# Patient Record
Sex: Female | Born: 1940 | Race: Black or African American | Hispanic: No | Marital: Single
Health system: Southern US, Academic
[De-identification: ages and names within clinical notes are randomized; demographics above are authoritative.]

## PROBLEM LIST (undated history)

## (undated) ENCOUNTER — Telehealth: Attending: Internal Medicine | Primary: Internal Medicine

## (undated) ENCOUNTER — Ambulatory Visit

## (undated) ENCOUNTER — Telehealth

## (undated) ENCOUNTER — Ambulatory Visit: Attending: Clinical | Primary: Clinical

## (undated) ENCOUNTER — Encounter

## (undated) ENCOUNTER — Encounter: Attending: Audiologist | Primary: Audiologist

## (undated) ENCOUNTER — Encounter: Attending: Internal Medicine | Primary: Internal Medicine

## (undated) ENCOUNTER — Ambulatory Visit: Payer: MEDICARE

## (undated) ENCOUNTER — Encounter: Payer: MEDICARE | Attending: Ophthalmology | Primary: Ophthalmology

## (undated) ENCOUNTER — Ambulatory Visit: Payer: MEDICARE | Attending: Internal Medicine | Primary: Internal Medicine

## (undated) ENCOUNTER — Encounter
Attending: Student in an Organized Health Care Education/Training Program | Primary: Student in an Organized Health Care Education/Training Program

## (undated) ENCOUNTER — Ambulatory Visit: Payer: Medicare (Managed Care)

## (undated) ENCOUNTER — Ambulatory Visit: Attending: Internal Medicine | Primary: Internal Medicine

## (undated) ENCOUNTER — Encounter: Payer: MEDICARE | Attending: Internal Medicine | Primary: Internal Medicine

## (undated) ENCOUNTER — Non-Acute Institutional Stay: Payer: MEDICARE | Attending: Internal Medicine | Primary: Internal Medicine

## (undated) ENCOUNTER — Encounter
Payer: MEDICARE | Attending: Student in an Organized Health Care Education/Training Program | Primary: Student in an Organized Health Care Education/Training Program

## (undated) ENCOUNTER — Ambulatory Visit
Payer: MEDICARE | Attending: Student in an Organized Health Care Education/Training Program | Primary: Student in an Organized Health Care Education/Training Program

## (undated) ENCOUNTER — Encounter: Attending: Anesthesiology | Primary: Anesthesiology

## (undated) ENCOUNTER — Other Ambulatory Visit: Attending: Clinical | Primary: Clinical

## (undated) ENCOUNTER — Ambulatory Visit: Payer: Medicare (Managed Care) | Attending: Internal Medicine | Primary: Internal Medicine

## (undated) ENCOUNTER — Encounter: Attending: Family | Primary: Family

## (undated) ENCOUNTER — Encounter: Attending: Cardiovascular Disease | Primary: Cardiovascular Disease

## (undated) ENCOUNTER — Ambulatory Visit: Payer: MEDICARE | Attending: Clinical | Primary: Clinical

## (undated) ENCOUNTER — Encounter: Attending: Ophthalmology | Primary: Ophthalmology

## (undated) ENCOUNTER — Other Ambulatory Visit

## (undated) ENCOUNTER — Ambulatory Visit: Payer: MEDICARE | Attending: Obstetrics & Gynecology | Primary: Obstetrics & Gynecology

## (undated) ENCOUNTER — Ambulatory Visit: Payer: MEDICARE | Attending: Audiologist | Primary: Audiologist

## (undated) ENCOUNTER — Encounter: Payer: MEDICARE | Attending: Audiologist | Primary: Audiologist

## (undated) ENCOUNTER — Ambulatory Visit
Payer: MEDICARE | Attending: Rehabilitative and Restorative Service Providers" | Primary: Rehabilitative and Restorative Service Providers"

## (undated) ENCOUNTER — Ambulatory Visit: Payer: Medicare (Managed Care) | Attending: Clinical | Primary: Clinical

## (undated) DIAGNOSIS — E119 Type 2 diabetes mellitus without complications: Secondary | ICD-10-CM

## (undated) DIAGNOSIS — I10 Essential (primary) hypertension: Secondary | ICD-10-CM

## (undated) HISTORY — PX: MENISCUS REPAIR: SHX5179

---
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
## 2014-04-16 DIAGNOSIS — I10 Essential (primary) hypertension: Secondary | ICD-10-CM | POA: Insufficient documentation

## 2015-02-12 ENCOUNTER — Encounter: Payer: Self-pay | Admitting: Emergency Medicine

## 2015-02-12 ENCOUNTER — Emergency Department
Admission: EM | Admit: 2015-02-12 | Discharge: 2015-02-12 | Disposition: A | Discharge status: Home / Self Care | Payer: Medicare Other | Attending: Emergency Medicine | Admitting: Emergency Medicine

## 2015-02-12 ENCOUNTER — Emergency Department: Payer: Medicare Other

## 2015-02-12 DIAGNOSIS — I10 Essential (primary) hypertension: Secondary | ICD-10-CM | POA: Insufficient documentation

## 2015-02-12 DIAGNOSIS — G8929 Other chronic pain: Secondary | ICD-10-CM | POA: Insufficient documentation

## 2015-02-12 DIAGNOSIS — R062 Wheezing: Secondary | ICD-10-CM | POA: Diagnosis not present

## 2015-02-12 DIAGNOSIS — R0602 Shortness of breath: Secondary | ICD-10-CM | POA: Diagnosis present

## 2015-02-12 DIAGNOSIS — Z880 Allergy status to penicillin: Secondary | ICD-10-CM | POA: Insufficient documentation

## 2015-02-12 DIAGNOSIS — E119 Type 2 diabetes mellitus without complications: Secondary | ICD-10-CM | POA: Diagnosis not present

## 2015-02-12 DIAGNOSIS — R0600 Dyspnea, unspecified: Secondary | ICD-10-CM | POA: Diagnosis not present

## 2015-02-12 DIAGNOSIS — M25561 Pain in right knee: Secondary | ICD-10-CM | POA: Insufficient documentation

## 2015-02-12 DIAGNOSIS — R0789 Other chest pain: Secondary | ICD-10-CM | POA: Insufficient documentation

## 2015-02-12 DIAGNOSIS — Z792 Long term (current) use of antibiotics: Secondary | ICD-10-CM | POA: Insufficient documentation

## 2015-02-12 DIAGNOSIS — M25562 Pain in left knee: Secondary | ICD-10-CM | POA: Insufficient documentation

## 2015-02-12 DIAGNOSIS — Z7982 Long term (current) use of aspirin: Secondary | ICD-10-CM | POA: Diagnosis not present

## 2015-02-12 DIAGNOSIS — Z87891 Personal history of nicotine dependence: Secondary | ICD-10-CM | POA: Diagnosis not present

## 2015-02-12 HISTORY — DX: Type 2 diabetes mellitus without complications: E11.9

## 2015-02-12 HISTORY — DX: Essential (primary) hypertension: I10

## 2015-02-12 LAB — CBC
HEMATOCRIT: 38.8 % (ref 35.0–47.0)
Hemoglobin: 12.5 g/dL (ref 12.0–16.0)
MCH: 24.4 pg — ABNORMAL LOW (ref 26.0–34.0)
MCHC: 32.2 g/dL (ref 32.0–36.0)
MCV: 75.7 fL — AB (ref 80.0–100.0)
Platelets: 285 10*3/uL (ref 150–440)
RBC: 5.12 MIL/uL (ref 3.80–5.20)
RDW: 16.4 % — AB (ref 11.5–14.5)
WBC: 7.7 10*3/uL (ref 3.6–11.0)

## 2015-02-12 LAB — BASIC METABOLIC PANEL
ANION GAP: 8 (ref 5–15)
BUN: 18 mg/dL (ref 6–20)
CALCIUM: 9 mg/dL (ref 8.9–10.3)
CHLORIDE: 109 mmol/L (ref 101–111)
CO2: 25 mmol/L (ref 22–32)
Creatinine, Ser: 0.94 mg/dL (ref 0.44–1.00)
GFR calc Af Amer: >60 mL/min (ref >60)
GFR calc non Af Amer: 59 mL/min — ABNORMAL LOW (ref >60)
Glucose, Bld: 112 mg/dL — ABNORMAL HIGH (ref 65–99)
Potassium: 3.5 mmol/L (ref 3.5–5.1)
SODIUM: 142 mmol/L (ref 135–145)

## 2015-02-12 LAB — TROPONIN I: Troponin I: <0.03 ng/mL (ref <0.031)

## 2015-02-12 LAB — BRAIN NATRIURETIC PEPTIDE: B NATRIURETIC PEPTIDE 5: 375 pg/mL — AB (ref 0.0–100.0)

## 2015-02-12 MED ORDER — BACITRACIN ZINC 500 UNIT/GM EX OINT
TOPICAL_OINTMENT | CUTANEOUS | Status: AC
Start: 2015-02-12 — End: 2015-02-13
  Filled 2015-02-12: qty 0.9

## 2015-02-12 NOTE — ED Notes (Signed)
Patient back from  X-ray 

## 2015-02-12 NOTE — ED Notes (Signed)
Pt presents to ED via EMS with c/o of SOB occurring today upon awakening from sleep approx. 1:30am. EMS states pt is currently non-compliant with home BP medications. Pt arrived to treatment room alert and oriented x4 and NAD noted. Respirations even, unlabored. Pt denies any pain at this time. VS per EMS listed below:  180/100 BP 76 HR 93% O2 RA  30 CO2 21 RR 0/10 pain level

## 2015-02-12 NOTE — ED Notes (Signed)
Patient discharged with instructions explained, patient verbalized no further question and understanding of instructions given. Encouraged to follow up with primary MD as indicated and return for any problems.

## 2015-02-12 NOTE — Discharge Instructions (Signed)

## 2015-02-12 NOTE — ED Provider Notes (Signed)
[facility] Emergency Department Provider Note  ____________________________________________  Time seen: Approximately 250 AM  I have reviewed the triage vital signs and the nursing notes.   HISTORY  Chief Complaint Shortness of Breath    HPI Rachael Gill is a 74 y.o. female who comes in with difficulty breathing. The patient reports that the difficulty breathing started yesterday when she woke up. She reports that she tried to lay back down and felt as though she wasn't getting enough air. She reports that she started wheezing so she went outside. She reports that she did some tai chi and thinks that helped to calm her breathing and then went back into the house. She reports that she was doing okay. She reports today she worked in the house all day. The patient reports that she did some significant amounts of cleaning in the house today. She said that she went to bed at approximately 2330 and woke up feeling as though she couldn't breathe again. She reports that she feels as though her trachea is closing. She reports that she heard wheezing again. She reports mild chest tightness and pain. She denies any fever or dizziness or lightheadedness she had no nausea or vomiting or sweats. The patient reports that her lower extremities do not swell but she does have some chronic knee pain and swelling. The patient is concerned about feeling short of breath so she came in for evaluation.   Past Medical History  Diagnosis Date  . Hypertension   . Diabetes mellitus without complication     There are no active problems to display for this patient.   Past Surgical History  Procedure Laterality Date  . Meniscus repair      Current Outpatient Rx  Name  Route  Sig  Dispense  Refill  . aspirin 81 MG tablet   Oral   Take 81 mg by mouth daily.         Marland Kitchen doxycycline (MONODOX) 50 MG capsule   Oral   Take 50 mg by mouth 2 (two) times daily.         . SUMAtriptan  (IMITREX) 25 MG tablet   Oral   Take 25 mg by mouth every 2 (two) hours as needed for migraine. May repeat in 2 hours if headache persists or recurs.           Allergies Penicillins and Sulfur  History reviewed. No pertinent family history.  Social History History  Substance Use Topics  . Smoking status: Former Research scientist (life sciences)  . Smokeless tobacco: Not on file  . Alcohol Use: Yes    Review of Systems Constitutional: No fever/chills Eyes: No visual changes. ENT: No sore throat. Cardiovascular: chest pain. Respiratory: shortness of breath. Gastrointestinal: No abdominal pain.  No nausea, no vomiting.   Genitourinary: Negative for dysuria. Musculoskeletal: Negative for back pain. Skin: Negative for rash. Neurological: Negative for headaches, lightheadedness or dizziness  10-point ROS otherwise negative.  ____________________________________________   PHYSICAL EXAM:  VITAL SIGNS: ED Triage Vitals  Enc Vitals Group     BP 02/12/15 0216 172/119 mmHg     Pulse Rate 02/12/15 0216 81     Resp 02/12/15 0216 22     Temp 02/12/15 0216 98.2 F (36.8 C)     Temp Source 02/12/15 0216 Oral     SpO2 02/12/15 0216 95 %     Weight 02/12/15 0216 157 lb (71.215 kg)     Height 02/12/15 0216 _0  (1.702 m)  Head Cir --      Peak Flow --      Pain Score 02/12/15 0217 0     Pain Loc --      Pain Edu? --      Excl. in [place]? --     Constitutional: Alert and oriented. Well appearing and in no acute distress. Eyes: Conjunctivae are normal. PERRL. EOMI. Head: Atraumatic. Nose: No congestion/rhinnorhea. Mouth/Throat: Mucous membranes are moist.  Oropharynx non-erythematous. Cardiovascular: Normal rate, regular rhythm. Grossly normal heart sounds.  Good peripheral circulation. Respiratory: Normal respiratory effort.  No retractions. Lungs CTAB. Gastrointestinal: Soft and nontender. No distention. Positive bowel sounds Genitourinary: Deferred Musculoskeletal: No lower extremity  tenderness nor edema.   Neurologic:  Normal speech and language. No gross focal neurologic deficits are appreciated.  Skin:  Skin is warm, dry and intact. No rash noted. Psychiatric: Mood and affect are normal. Speech and behavior are normal.  ____________________________________________   LABS (all labs ordered are listed, but only abnormal results are displayed)  Labs Reviewed  CBC - Abnormal; Notable for the following:    MCV 75.7 (*)    MCH 24.4 (*)    RDW 16.4 (*)    All other components within normal limits  BASIC METABOLIC PANEL - Abnormal; Notable for the following:    Glucose, Bld 112 (*)    GFR calc non Af Amer 59 (*)    All other components within normal limits  BRAIN NATRIURETIC PEPTIDE - Abnormal; Notable for the following:    B Natriuretic Peptide 375.0 (*)    All other components within normal limits  TROPONIN I   ____________________________________________  EKG  ED ECG REPORT   Date: 02/12/2015  EKG Time: 224  Rate: 75  Rhythm: normal EKG, normal sinus rhythm, PAC's noted  Axis: Left axis  Intervals:none  ST&T Change: Normal  ____________________________________________  RADIOLOGY  Chest x-ray: Diffusely increased interstitial markings may reflect chronic interstitial lung disease. ____________________________________________   PROCEDURES  Procedure(s) performed: None  Critical Care performed: No  ____________________________________________   INITIAL IMPRESSION / ASSESSMENT AND PLAN / ED COURSE  Pertinent labs & imaging results that were available during my care of the patient were reviewed by me and considered in my medical decision making (see chart for details).  This is a 74 year old female who comes in today with some shortness of breath and chest tightness. We will perform some blood work and perform a chest x-ray and reassess the patient. The patient appears in no acute distress and is calm at the moment.  The patient's blood  work is unremarkable. The patient has had no vital sign changes or hypoxia. The patient's lung sounds are clear and the patient is in no acute distress. I will discharge the patient home to follow-up with her primary care physician. At this time it is unknown the etiology of the patient's dyspnea but it is recommended that she follow-up with her primary care physician for further evaluation. ____________________________________________   FINAL CLINICAL IMPRESSION(S) / ED DIAGNOSES  Final diagnoses:  dypnea      Loney Hering, MD 02/12/15 [id number]

## 2015-02-12 NOTE — ED Notes (Signed)
Patient transported to X-ray 

## 2015-02-27 ENCOUNTER — Other Ambulatory Visit: Payer: Self-pay

## 2015-03-11 ENCOUNTER — Inpatient Hospital Stay: Admission: RE | Admit: 2015-03-11 | Payer: Medicare Other | Source: Ambulatory Visit | Admitting: Orthopedic Surgery

## 2015-03-11 ENCOUNTER — Encounter: Admission: RE | Payer: Self-pay | Source: Ambulatory Visit

## 2015-03-11 SURGERY — ARTHROPLASTY, KNEE, TOTAL
Anesthesia: Choice | Laterality: Right

## 2015-04-15 ENCOUNTER — Other Ambulatory Visit: Payer: Self-pay | Admitting: Specialist

## 2015-04-15 DIAGNOSIS — R0602 Shortness of breath: Secondary | ICD-10-CM

## 2015-04-15 DIAGNOSIS — J849 Interstitial pulmonary disease, unspecified: Secondary | ICD-10-CM

## 2015-04-23 ENCOUNTER — Ambulatory Visit
Admission: RE | Admit: 2015-04-23 | Discharge: 2015-04-23 | Disposition: A | Discharge status: Home / Self Care | Payer: Medicare Other | Source: Ambulatory Visit | Attending: Specialist | Admitting: Specialist

## 2015-04-23 DIAGNOSIS — J849 Interstitial pulmonary disease, unspecified: Secondary | ICD-10-CM | POA: Diagnosis not present

## 2015-04-23 DIAGNOSIS — R0602 Shortness of breath: Secondary | ICD-10-CM | POA: Insufficient documentation

## 2015-04-29 ENCOUNTER — Emergency Department
Admission: EM | Admit: 2015-04-29 | Discharge: 2015-04-29 | Discharge status: Against Medical Advice | Payer: Medicare Other | Attending: Emergency Medicine | Admitting: Emergency Medicine

## 2015-04-29 ENCOUNTER — Emergency Department: Payer: Medicare Other

## 2015-04-29 ENCOUNTER — Other Ambulatory Visit: Payer: Self-pay

## 2015-04-29 ENCOUNTER — Other Ambulatory Visit: Payer: Self-pay | Admitting: Specialist

## 2015-04-29 DIAGNOSIS — I10 Essential (primary) hypertension: Secondary | ICD-10-CM | POA: Diagnosis not present

## 2015-04-29 DIAGNOSIS — R079 Chest pain, unspecified: Secondary | ICD-10-CM | POA: Diagnosis present

## 2015-04-29 DIAGNOSIS — R911 Solitary pulmonary nodule: Secondary | ICD-10-CM

## 2015-04-29 DIAGNOSIS — E119 Type 2 diabetes mellitus without complications: Secondary | ICD-10-CM | POA: Diagnosis not present

## 2015-04-29 LAB — BASIC METABOLIC PANEL
ANION GAP: 8 (ref 5–15)
BUN: 23 mg/dL — ABNORMAL HIGH (ref 6–20)
CO2: 27 mmol/L (ref 22–32)
CREATININE: 0.98 mg/dL (ref 0.44–1.00)
Calcium: 9.2 mg/dL (ref 8.9–10.3)
Chloride: 102 mmol/L (ref 101–111)
GFR calc Af Amer: >60 mL/min (ref >60)
GFR calc non Af Amer: 56 mL/min — ABNORMAL LOW (ref >60)
GLUCOSE: 94 mg/dL (ref 65–99)
Potassium: 4 mmol/L (ref 3.5–5.1)
Sodium: 137 mmol/L (ref 135–145)

## 2015-04-29 LAB — CBC
HCT: 41.8 % (ref 35.0–47.0)
Hemoglobin: 13.3 g/dL (ref 12.0–16.0)
MCH: 23.7 pg — AB (ref 26.0–34.0)
MCHC: 31.8 g/dL — AB (ref 32.0–36.0)
MCV: 74.4 fL — ABNORMAL LOW (ref 80.0–100.0)
PLATELETS: 298 10*3/uL (ref 150–440)
RBC: 5.61 MIL/uL — AB (ref 3.80–5.20)
RDW: 16.6 % — ABNORMAL HIGH (ref 11.5–14.5)
WBC: 6.4 10*3/uL (ref 3.6–11.0)

## 2015-04-29 LAB — TROPONIN I: Troponin I: <0.03 ng/mL (ref <0.031)

## 2015-04-29 NOTE — ED Notes (Signed)
Pt here for left of center chest pain that started this am. 4/10 pain. Denies sob.

## 2015-07-05 DIAGNOSIS — I340 Nonrheumatic mitral (valve) insufficiency: Secondary | ICD-10-CM | POA: Insufficient documentation

## 2015-09-04 ENCOUNTER — Ambulatory Visit
Admission: RE | Admit: 2015-09-04 | Discharge: 2015-09-04 | Disposition: A | Discharge status: Home / Self Care | Payer: Medicare Other | Source: Ambulatory Visit | Attending: Specialist | Admitting: Specialist

## 2015-09-04 DIAGNOSIS — R911 Solitary pulmonary nodule: Secondary | ICD-10-CM | POA: Diagnosis not present

## 2015-09-04 DIAGNOSIS — K449 Diaphragmatic hernia without obstruction or gangrene: Secondary | ICD-10-CM | POA: Insufficient documentation

## 2015-09-04 DIAGNOSIS — I2510 Atherosclerotic heart disease of native coronary artery without angina pectoris: Secondary | ICD-10-CM | POA: Insufficient documentation

## 2015-09-25 ENCOUNTER — Other Ambulatory Visit: Payer: Self-pay | Admitting: Specialist

## 2015-09-25 DIAGNOSIS — R911 Solitary pulmonary nodule: Secondary | ICD-10-CM

## 2015-12-04 DIAGNOSIS — E1169 Type 2 diabetes mellitus with other specified complication: Secondary | ICD-10-CM | POA: Insufficient documentation

## 2016-01-24 DIAGNOSIS — I359 Nonrheumatic aortic valve disorder, unspecified: Secondary | ICD-10-CM | POA: Insufficient documentation

## 2016-09-01 ENCOUNTER — Ambulatory Visit: Admission: RE | Admit: 2016-09-01 | Payer: Medicare Other | Source: Ambulatory Visit

## 2016-09-16 ENCOUNTER — Ambulatory Visit
Admission: RE | Admit: 2016-09-16 | Discharge: 2016-09-16 | Disposition: A | Discharge status: Home / Self Care | Payer: Medicare Other | Source: Ambulatory Visit | Attending: Specialist | Admitting: Specialist

## 2016-09-16 DIAGNOSIS — R911 Solitary pulmonary nodule: Secondary | ICD-10-CM | POA: Diagnosis present

## 2016-09-16 DIAGNOSIS — I700 Atherosclerosis of aorta: Secondary | ICD-10-CM | POA: Diagnosis not present

## 2016-09-16 DIAGNOSIS — I2510 Atherosclerotic heart disease of native coronary artery without angina pectoris: Secondary | ICD-10-CM | POA: Insufficient documentation

## 2017-04-07 MED ORDER — PREDNISOLONE SODIUM PHOSPHATE 1 % EYE DROPS
Freq: Every day | OPHTHALMIC | 1 refills | 0 days | Status: CP
Start: 2017-04-07 — End: 2017-12-07

## 2017-04-23 DIAGNOSIS — E785 Hyperlipidemia, unspecified: Secondary | ICD-10-CM | POA: Insufficient documentation

## 2017-04-23 DIAGNOSIS — I272 Pulmonary hypertension, unspecified: Secondary | ICD-10-CM | POA: Insufficient documentation

## 2017-04-23 DIAGNOSIS — E876 Hypokalemia: Secondary | ICD-10-CM | POA: Insufficient documentation

## 2017-10-20 ENCOUNTER — Encounter: Admit: 2017-10-20 | Discharge: 2017-10-21 | Payer: MEDICARE

## 2017-10-20 DIAGNOSIS — E1169 Type 2 diabetes mellitus with other specified complication: Principal | ICD-10-CM

## 2017-10-20 DIAGNOSIS — I10 Essential (primary) hypertension: Secondary | ICD-10-CM

## 2017-10-20 DIAGNOSIS — E785 Hyperlipidemia, unspecified: Secondary | ICD-10-CM

## 2017-10-20 DIAGNOSIS — Z79899 Other long term (current) drug therapy: Secondary | ICD-10-CM

## 2017-10-25 ENCOUNTER — Encounter: Admit: 2017-10-25 | Discharge: 2017-10-26 | Payer: MEDICARE | Attending: Internal Medicine | Primary: Internal Medicine

## 2017-10-25 DIAGNOSIS — R2231 Localized swelling, mass and lump, right upper limb: Secondary | ICD-10-CM

## 2017-10-25 DIAGNOSIS — E1169 Type 2 diabetes mellitus with other specified complication: Secondary | ICD-10-CM

## 2017-10-25 DIAGNOSIS — E785 Hyperlipidemia, unspecified: Secondary | ICD-10-CM

## 2017-10-25 DIAGNOSIS — I340 Nonrheumatic mitral (valve) insufficiency: Secondary | ICD-10-CM

## 2017-10-25 DIAGNOSIS — M542 Cervicalgia: Secondary | ICD-10-CM

## 2017-10-25 DIAGNOSIS — I10 Essential (primary) hypertension: Secondary | ICD-10-CM

## 2017-10-25 DIAGNOSIS — N904 Leukoplakia of vulva: Secondary | ICD-10-CM

## 2017-10-25 DIAGNOSIS — Z0000 Encounter for general adult medical examination without abnormal findings: Principal | ICD-10-CM

## 2017-10-25 MED ORDER — MAXZIDE 75 MG-50 MG TABLET: 1 | tablet | Freq: Every day | 3 refills | 0 days | Status: AC

## 2017-10-25 MED ORDER — MAXZIDE 75 MG-50 MG TABLET
ORAL_TABLET | Freq: Every day | ORAL | 3 refills | 0.00000 days | Status: CP
Start: 2017-10-25 — End: 2018-03-25

## 2017-10-26 MED ORDER — CLOBETASOL 0.05 % TOPICAL OINTMENT
Freq: Two times a day (BID) | TOPICAL | 0 refills | 0 days | Status: CP
Start: 2017-10-26 — End: 2017-10-27

## 2017-10-27 MED ORDER — HALOBETASOL PROPIONATE 0.05 % TOPICAL CREAM
Freq: Two times a day (BID) | TOPICAL | 0 refills | 0 days | Status: CP
Start: 2017-10-27 — End: 2018-10-06

## 2017-11-15 ENCOUNTER — Ambulatory Visit: Admit: 2017-11-15 | Discharge: 2017-11-16 | Payer: MEDICARE | Attending: Ophthalmology | Primary: Ophthalmology

## 2017-11-15 DIAGNOSIS — Z961 Presence of intraocular lens: Secondary | ICD-10-CM

## 2017-11-15 DIAGNOSIS — Z9841 Cataract extraction status, right eye: Secondary | ICD-10-CM

## 2017-11-15 DIAGNOSIS — H40009 Preglaucoma, unspecified, unspecified eye: Principal | ICD-10-CM

## 2017-11-15 DIAGNOSIS — H04123 Dry eye syndrome of bilateral lacrimal glands: Secondary | ICD-10-CM

## 2017-12-06 IMAGING — CT CT CHEST W/O CM
2 of 3 series · 15 of 36 positions shown, 18 images · non-contrast
Comparison: Chest CT 04/23/2015.

CLINICAL DATA: 74-year-old female with breathing difficulty. Former
smoker (quit 30 years ago). History of asthma.

EXAM:
CT CHEST WITHOUT CONTRAST
TECHNIQUE: Multidetector CT imaging of the chest was performed following the
standard protocol without IV contrast.

[Series 2: routine chest wo · axial · 0.69mm/px · z∈[-694,-440]mm · 12 of 61 slices shown, 15 images]
[im 5/61  mediastinal]
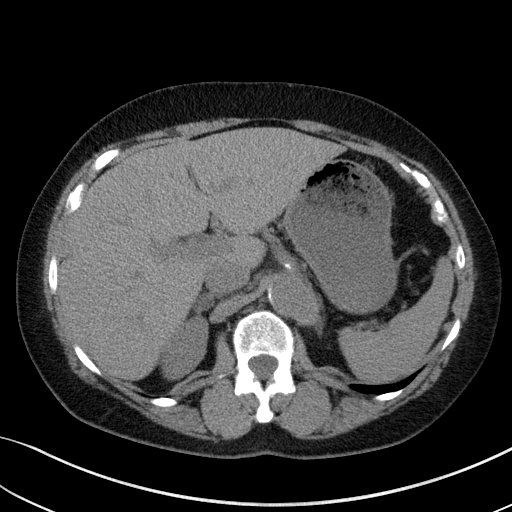
[im 5/61  lung]
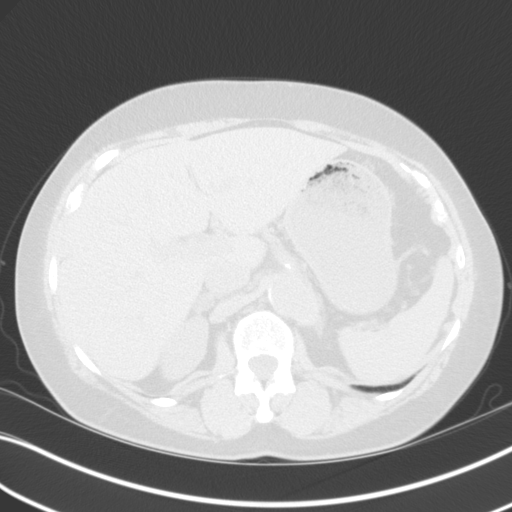
[im 9/61  lung]
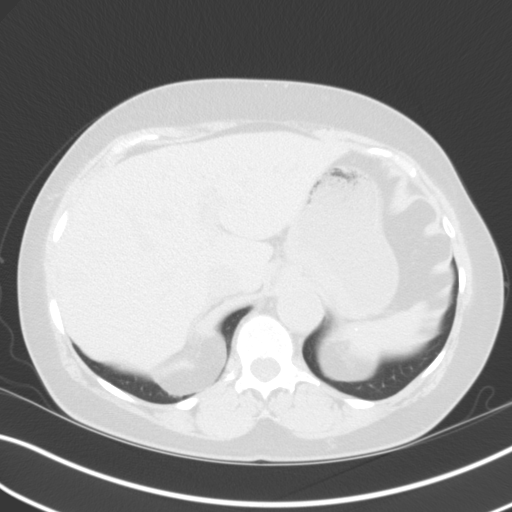
[im 14/61  lung]
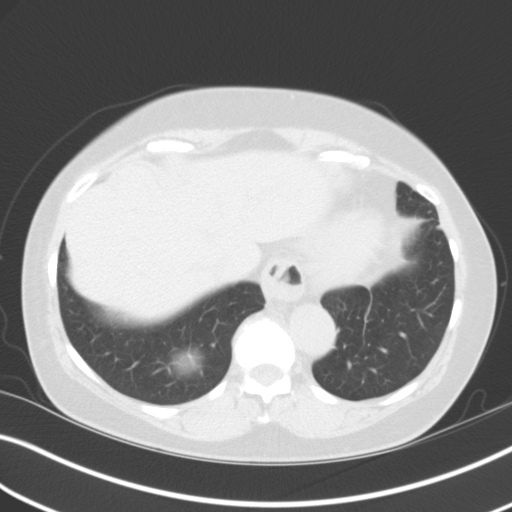
[im 18/61  lung]
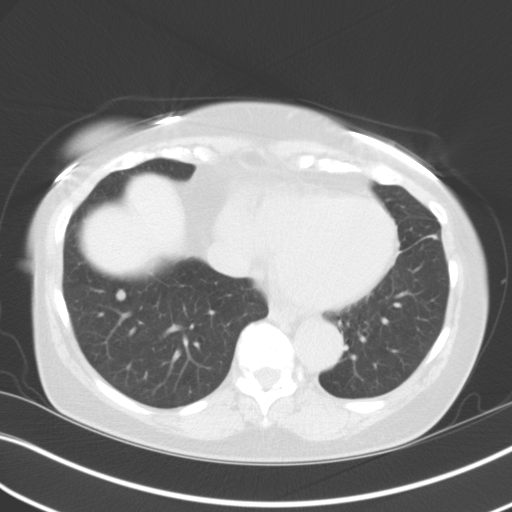
[im 23/61  mediastinal]
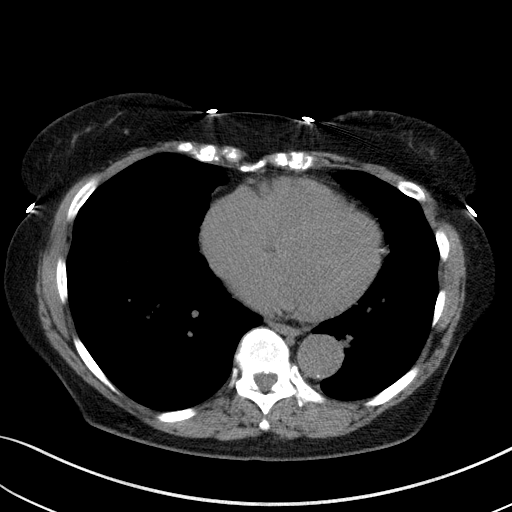
[im 23/61  lung]
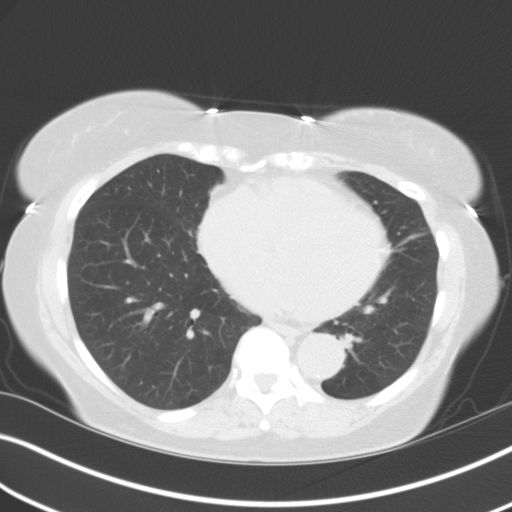
[im 27/61  lung]
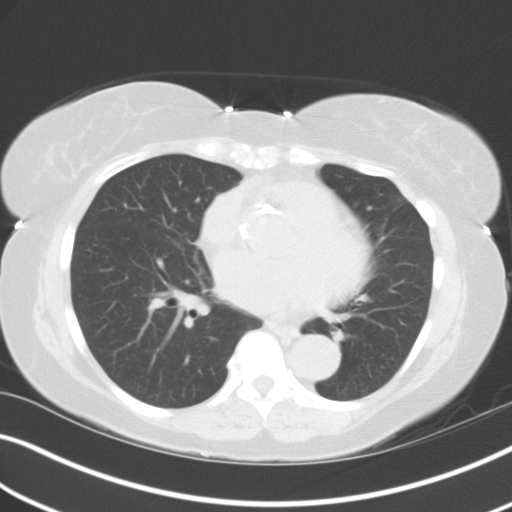
[im 34/61  lung]
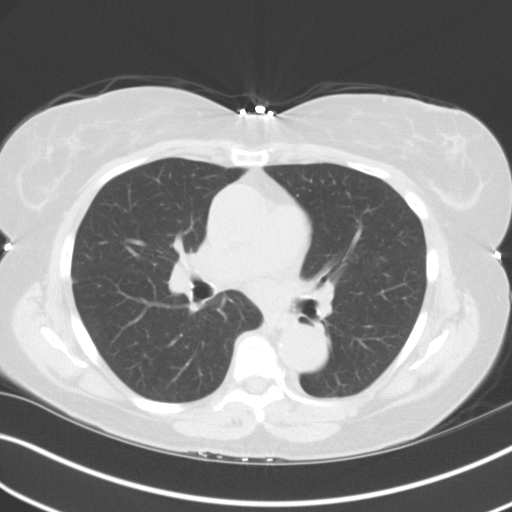
[im 38/61  lung]
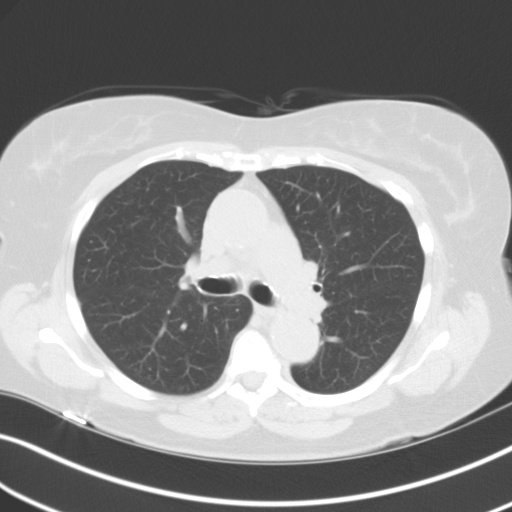
[im 43/61  mediastinal]
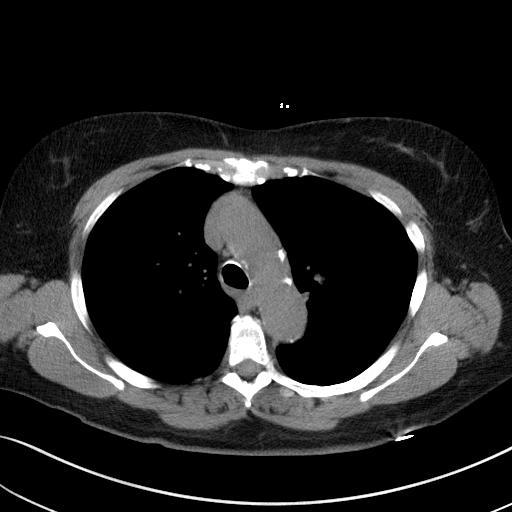
[im 43/61  lung]
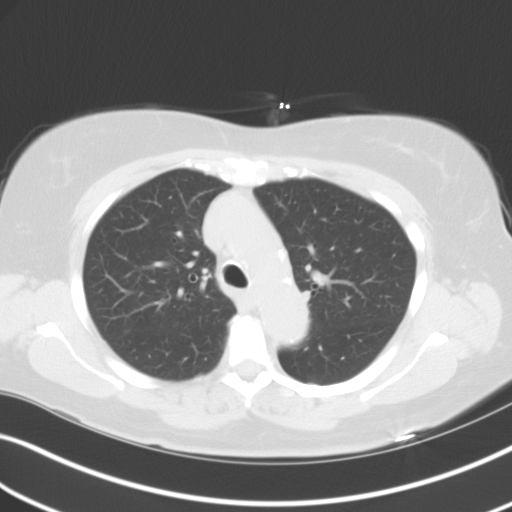
[im 47/61  lung]
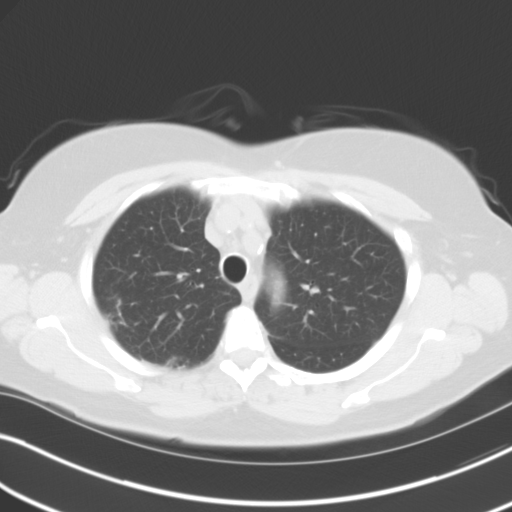
[im 52/61  lung]
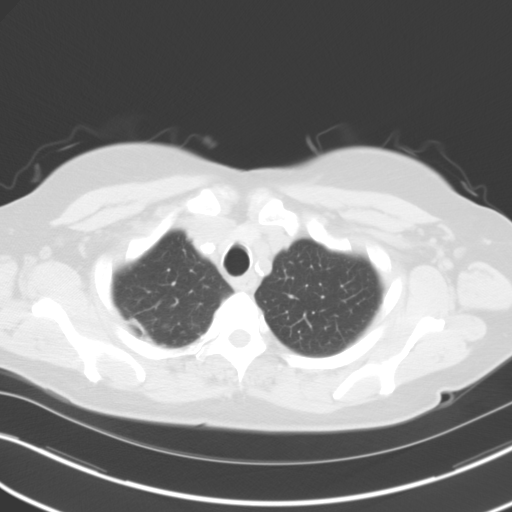
[im 56/61  lung]
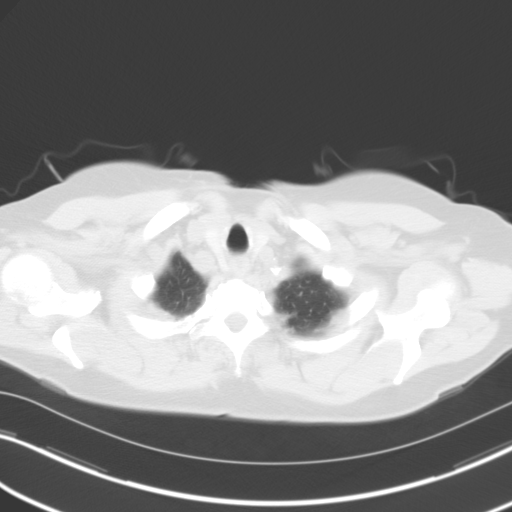

[Series 5: cor routine chest wo · coronal · 0.60mm/px · 3 of 126 slices shown]
[im 26/126  lung]
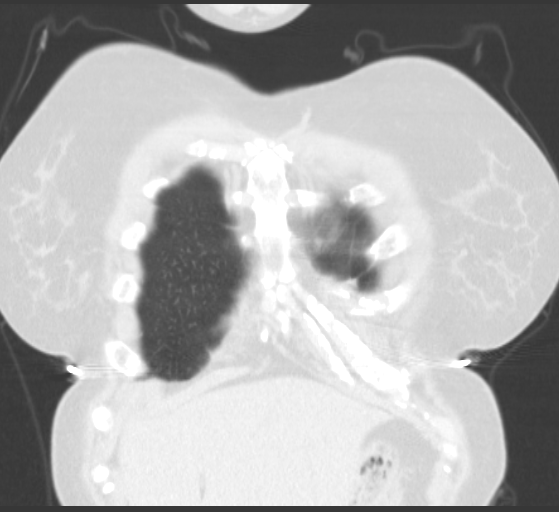
[im 51/126  lung]
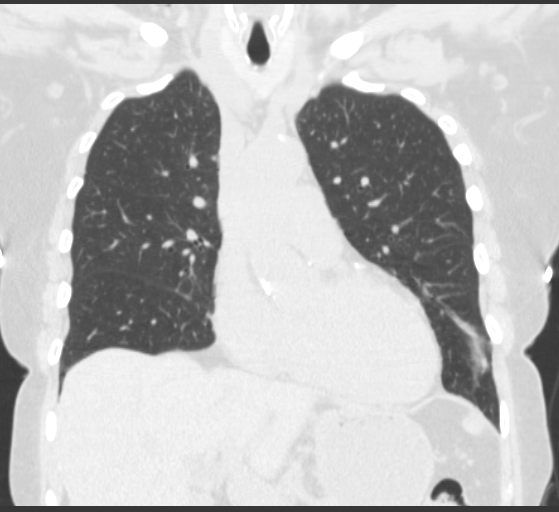
[im 76/126  lung]
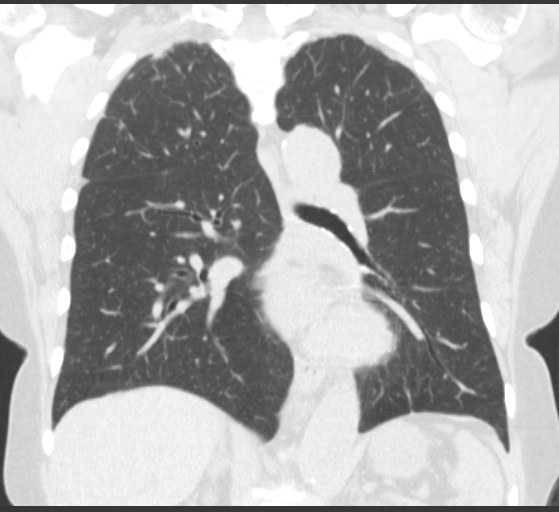

[15 of 36 positions shown; findings below may reference images not displayed]

FINDINGS: Mediastinum/Lymph Nodes: Heart size is normal. There is no
significant pericardial fluid, thickening or pericardial
calcification. There is atherosclerosis of the thoracic aorta, the
great vessels of the mediastinum and the coronary arteries,
including calcified atherosclerotic plaque in the left main, left
anterior descending, left circumflex and right coronary arteries.
Small hiatal hernia. No pathologically enlarged mediastinal or hilar
lymph nodes. Please note that accurate exclusion of hilar adenopathy
is limited on noncontrast CT scans. No axillary lymphadenopathy.

Lungs/Pleura: Small bilateral Bochdalek's hernias. 7 mm pulmonary
nodule in the inferior aspect of the right lower lobe (image 44 of
series 4) is unchanged. No other larger more suspicious appearing
pulmonary nodules or masses are noted. Scattered areas of linear
scarring are noted throughout the lungs bilaterally. There is also
some bilateral apical pleural parenchymal thickening (right greater
than left), which is unchanged, most compatible with chronic post
infectious or inflammatory scarring.

Upper Abdomen: Atherosclerosis.  Calcified granulomas in the spleen.

Musculoskeletal/Soft Tissues: There are no aggressive appearing
lytic or blastic lesions noted in the visualized portions of the
skeleton.
IMPRESSION: 1. No acute findings in the thorax to account for the patient's
symptoms.
2. 7 mm right lower lobe pulmonary nodule is unchanged, which is
reassuring given the 5 months of stability. A repeat noncontrast
chest CT is recommended in 13 months to ensure a full 18 months of
stability. This recommendation follows the consensus statement:
Guidelines for Management of Small Pulmonary Nodules Detected on CT
Scans: A Statement from the [HOSPITAL] as published in
3. Atherosclerosis, including left main and 3 vessel coronary artery
disease. Please note that although the presence of coronary artery
calcium documents the presence of coronary artery disease, the
severity of this disease and any potential stenosis cannot be
assessed on this non-gated CT examination. Assessment for potential
risk factor modification, dietary therapy or pharmacologic therapy
may be warranted, if clinically indicated.
4. Small hiatal hernia.
5. Additional incidental findings, as above.

## 2017-12-07 MED ORDER — PREDNISOLONE ACETATE 1 % EYE DROPS,SUSPENSION
Freq: Every day | OPHTHALMIC | 0 refills | 0.00000 days | Status: CP
Start: 2017-12-07 — End: 2018-02-28

## 2017-12-13 ENCOUNTER — Encounter: Admit: 2017-12-13 | Discharge: 2017-12-14 | Payer: MEDICARE | Attending: Ophthalmology | Primary: Ophthalmology

## 2017-12-13 DIAGNOSIS — H04123 Dry eye syndrome of bilateral lacrimal glands: Secondary | ICD-10-CM

## 2017-12-13 DIAGNOSIS — H40009 Preglaucoma, unspecified, unspecified eye: Principal | ICD-10-CM

## 2018-01-13 ENCOUNTER — Encounter: Admit: 2018-01-13 | Discharge: 2018-01-14 | Payer: MEDICARE | Attending: Ophthalmology | Primary: Ophthalmology

## 2018-01-13 DIAGNOSIS — H04123 Dry eye syndrome of bilateral lacrimal glands: Secondary | ICD-10-CM

## 2018-01-13 DIAGNOSIS — H40009 Preglaucoma, unspecified, unspecified eye: Principal | ICD-10-CM

## 2018-02-28 ENCOUNTER — Encounter: Admit: 2018-02-28 | Discharge: 2018-03-01 | Payer: MEDICARE | Attending: Internal Medicine | Primary: Internal Medicine

## 2018-02-28 DIAGNOSIS — I10 Essential (primary) hypertension: Principal | ICD-10-CM

## 2018-02-28 DIAGNOSIS — I272 Pulmonary hypertension, unspecified: Secondary | ICD-10-CM

## 2018-02-28 MED ORDER — AMLODIPINE 2.5 MG TABLET
ORAL_TABLET | Freq: Every day | ORAL | 2 refills | 0.00000 days | Status: CP
Start: 2018-02-28 — End: 2018-03-25

## 2018-03-02 MED ORDER — PREDNISOLONE ACETATE 1 % EYE DROPS,SUSPENSION
0 refills | 0 days | Status: CP
Start: 2018-03-02 — End: 2018-10-06

## 2018-03-25 MED ORDER — MAXZIDE 75 MG-50 MG TABLET
ORAL_TABLET | 1 refills | 0 days | Status: CP
Start: 2018-03-25 — End: 2018-10-27

## 2018-03-25 MED ORDER — AMLODIPINE 2.5 MG TABLET
ORAL_TABLET | Freq: Every day | ORAL | 1 refills | 0.00000 days | Status: CP
Start: 2018-03-25 — End: 2018-10-06

## 2018-03-25 MED ORDER — ONETOUCH ULTRA BLUE TEST STRIP
1 refills | 0 days | Status: CP
Start: 2018-03-25 — End: 2018-10-26

## 2018-04-06 ENCOUNTER — Ambulatory Visit: Admit: 2018-04-06 | Discharge: 2018-04-07 | Payer: MEDICARE | Attending: Internal Medicine | Primary: Internal Medicine

## 2018-04-06 DIAGNOSIS — I10 Essential (primary) hypertension: Secondary | ICD-10-CM

## 2018-04-06 DIAGNOSIS — K644 Residual hemorrhoidal skin tags: Principal | ICD-10-CM

## 2018-05-27 MED ORDER — LANCETS 23 GAUGE
Freq: Every day | 1 refills | 0.00000 days | Status: CP
Start: 2018-05-27 — End: 2018-10-26

## 2018-07-01 ENCOUNTER — Encounter: Admit: 2018-07-01 | Discharge: 2018-07-02 | Payer: MEDICARE

## 2018-07-01 DIAGNOSIS — E1169 Type 2 diabetes mellitus with other specified complication: Principal | ICD-10-CM

## 2018-07-01 DIAGNOSIS — E785 Hyperlipidemia, unspecified: Secondary | ICD-10-CM

## 2018-07-04 ENCOUNTER — Encounter: Admit: 2018-07-04 | Discharge: 2018-07-05 | Payer: MEDICARE | Attending: Internal Medicine | Primary: Internal Medicine

## 2018-07-04 DIAGNOSIS — I10 Essential (primary) hypertension: Secondary | ICD-10-CM

## 2018-07-04 DIAGNOSIS — E785 Hyperlipidemia, unspecified: Secondary | ICD-10-CM

## 2018-07-04 DIAGNOSIS — E1169 Type 2 diabetes mellitus with other specified complication: Principal | ICD-10-CM

## 2018-08-13 ENCOUNTER — Emergency Department: Admit: 2018-08-13 | Discharge: 2018-08-13 | Disposition: A | Discharge status: Home / Self Care | Payer: MEDICARE

## 2018-08-13 ENCOUNTER — Encounter: Admit: 2018-08-13 | Discharge: 2018-08-13 | Disposition: A | Discharge status: Home / Self Care | Payer: MEDICARE

## 2018-08-13 DIAGNOSIS — R079 Chest pain, unspecified: Principal | ICD-10-CM

## 2018-08-13 DIAGNOSIS — R0602 Shortness of breath: Secondary | ICD-10-CM

## 2018-08-13 MED ORDER — FAMOTIDINE 20 MG TABLET
ORAL_TABLET | Freq: Two times a day (BID) | ORAL | 3 refills | 0 days | Status: CP
Start: 2018-08-13 — End: 2018-10-26

## 2018-08-13 MED ORDER — ALBUTEROL SULFATE HFA 90 MCG/ACTUATION AEROSOL INHALER
RESPIRATORY_TRACT | 0 refills | 0.00000 days | Status: CP | PRN
Start: 2018-08-13 — End: 2019-08-13

## 2018-10-06 ENCOUNTER — Ambulatory Visit: Admit: 2018-10-06 | Discharge: 2018-10-07 | Payer: MEDICARE | Attending: Internal Medicine | Primary: Internal Medicine

## 2018-10-06 DIAGNOSIS — I10 Essential (primary) hypertension: Principal | ICD-10-CM

## 2018-10-06 MED ORDER — AMLODIPINE 5 MG TABLET
ORAL_TABLET | Freq: Every day | ORAL | 2 refills | 0.00000 days | Status: CP
Start: 2018-10-06 — End: 2019-10-06

## 2018-10-20 ENCOUNTER — Encounter: Admit: 2018-10-20 | Discharge: 2018-10-21 | Payer: MEDICARE

## 2018-10-20 DIAGNOSIS — I10 Essential (primary) hypertension: Principal | ICD-10-CM

## 2018-10-20 DIAGNOSIS — E1169 Type 2 diabetes mellitus with other specified complication: Secondary | ICD-10-CM

## 2018-10-20 DIAGNOSIS — E785 Hyperlipidemia, unspecified: Secondary | ICD-10-CM

## 2018-10-20 DIAGNOSIS — Z79899 Other long term (current) drug therapy: Secondary | ICD-10-CM

## 2018-10-26 ENCOUNTER — Encounter: Admit: 2018-10-26 | Discharge: 2018-10-27 | Payer: MEDICARE | Attending: Internal Medicine | Primary: Internal Medicine

## 2018-10-26 DIAGNOSIS — Z0000 Encounter for general adult medical examination without abnormal findings: Principal | ICD-10-CM

## 2018-10-26 DIAGNOSIS — E785 Hyperlipidemia, unspecified: Secondary | ICD-10-CM

## 2018-10-26 DIAGNOSIS — E1169 Type 2 diabetes mellitus with other specified complication: Secondary | ICD-10-CM

## 2018-10-26 DIAGNOSIS — R0982 Postnasal drip: Secondary | ICD-10-CM

## 2018-10-26 DIAGNOSIS — I10 Essential (primary) hypertension: Secondary | ICD-10-CM

## 2018-10-27 ENCOUNTER — Encounter: Admit: 2018-10-27 | Discharge: 2018-10-28 | Payer: MEDICARE

## 2018-10-27 MED ORDER — MAXZIDE 75 MG-50 MG TABLET
ORAL_TABLET | 1 refills | 0 days | Status: CP
Start: 2018-10-27 — End: 2019-01-02

## 2018-10-28 ENCOUNTER — Encounter: Admit: 2018-10-28 | Discharge: 2018-10-29 | Payer: MEDICARE | Attending: Audiologist | Primary: Audiologist

## 2018-10-28 DIAGNOSIS — H9313 Tinnitus, bilateral: Secondary | ICD-10-CM

## 2018-10-28 DIAGNOSIS — H9193 Unspecified hearing loss, bilateral: Principal | ICD-10-CM

## 2018-10-28 DIAGNOSIS — H903 Sensorineural hearing loss, bilateral: Secondary | ICD-10-CM

## 2018-11-10 ENCOUNTER — Encounter: Admit: 2018-11-10 | Discharge: 2018-11-11 | Payer: MEDICARE

## 2018-11-28 DIAGNOSIS — Z1231 Encounter for screening mammogram for malignant neoplasm of breast: Principal | ICD-10-CM

## 2018-11-28 DIAGNOSIS — Z1239 Encounter for other screening for malignant neoplasm of breast: Principal | ICD-10-CM

## 2018-11-29 ENCOUNTER — Encounter: Admit: 2018-11-29 | Discharge: 2018-11-30 | Payer: MEDICARE

## 2018-11-29 DIAGNOSIS — Z1239 Encounter for other screening for malignant neoplasm of breast: Principal | ICD-10-CM

## 2018-11-29 DIAGNOSIS — Z1231 Encounter for screening mammogram for malignant neoplasm of breast: Principal | ICD-10-CM

## 2018-12-23 ENCOUNTER — Encounter: Admit: 2018-12-23 | Discharge: 2018-12-24 | Payer: MEDICARE

## 2018-12-23 DIAGNOSIS — Z79899 Other long term (current) drug therapy: Principal | ICD-10-CM

## 2018-12-28 MED ORDER — LISINOPRIL 10 MG TABLET
ORAL_TABLET | Freq: Every day | ORAL | 0 refills | 0.00000 days | Status: CP
Start: 2018-12-28 — End: 2019-01-02

## 2019-01-02 ENCOUNTER — Encounter: Admit: 2019-01-02 | Discharge: 2019-01-03 | Payer: MEDICARE | Attending: Internal Medicine | Primary: Internal Medicine

## 2019-01-02 DIAGNOSIS — E785 Hyperlipidemia, unspecified: Secondary | ICD-10-CM

## 2019-01-02 DIAGNOSIS — E1169 Type 2 diabetes mellitus with other specified complication: Secondary | ICD-10-CM

## 2019-01-02 DIAGNOSIS — I10 Essential (primary) hypertension: Principal | ICD-10-CM

## 2019-01-02 MED ORDER — IRBESARTAN 150 MG TABLET
ORAL_TABLET | Freq: Every day | ORAL | 2 refills | 0.00000 days | Status: CP
Start: 2019-01-02 — End: 2019-02-28

## 2019-02-28 MED ORDER — IRBESARTAN 150 MG TABLET
ORAL_TABLET | Freq: Every day | ORAL | 3 refills | 0.00000 days | Status: CP
Start: 2019-02-28 — End: 2019-04-28

## 2019-04-24 ENCOUNTER — Encounter: Admit: 2019-04-24 | Discharge: 2019-04-25 | Payer: MEDICARE

## 2019-04-24 DIAGNOSIS — E1169 Type 2 diabetes mellitus with other specified complication: Principal | ICD-10-CM

## 2019-04-24 DIAGNOSIS — I10 Essential (primary) hypertension: Secondary | ICD-10-CM

## 2019-04-24 DIAGNOSIS — Z79899 Other long term (current) drug therapy: Secondary | ICD-10-CM

## 2019-04-24 DIAGNOSIS — E785 Hyperlipidemia, unspecified: Secondary | ICD-10-CM

## 2019-04-28 ENCOUNTER — Encounter: Admit: 2019-04-28 | Discharge: 2019-04-29 | Payer: MEDICARE | Attending: Internal Medicine | Primary: Internal Medicine

## 2019-04-28 DIAGNOSIS — D171 Benign lipomatous neoplasm of skin and subcutaneous tissue of trunk: Secondary | ICD-10-CM

## 2019-04-28 DIAGNOSIS — E785 Hyperlipidemia, unspecified: Secondary | ICD-10-CM

## 2019-04-28 DIAGNOSIS — I10 Essential (primary) hypertension: Principal | ICD-10-CM

## 2019-04-28 DIAGNOSIS — E1169 Type 2 diabetes mellitus with other specified complication: Secondary | ICD-10-CM

## 2019-04-28 MED ORDER — IRBESARTAN 150 MG TABLET
ORAL_TABLET | Freq: Every day | ORAL | 3 refills | 90 days
Start: 2019-04-28 — End: 2020-04-27

## 2019-06-06 MED ORDER — IRBESARTAN 150 MG TABLET
ORAL_TABLET | Freq: Every day | ORAL | 1 refills | 90 days | Status: CP
Start: 2019-06-06 — End: 2020-06-05

## 2019-07-07 DIAGNOSIS — I10 Essential (primary) hypertension: Secondary | ICD-10-CM

## 2019-07-07 MED ORDER — AMLODIPINE 5 MG TABLET
ORAL_TABLET | Freq: Every day | ORAL | 1 refills | 90.00000 days | Status: CP
Start: 2019-07-07 — End: 2020-07-06

## 2019-09-12 MED ORDER — ALBUTEROL SULFATE HFA 90 MCG/ACTUATION AEROSOL INHALER
RESPIRATORY_TRACT | 0 refills | 0 days | Status: CP | PRN
Start: 2019-09-12 — End: 2020-09-11

## 2019-09-15 ENCOUNTER — Ambulatory Visit: Admit: 2019-09-15 | Discharge: 2019-09-16

## 2019-09-15 ENCOUNTER — Telehealth: Admit: 2019-09-15 | Discharge: 2019-09-15 | Payer: MEDICARE

## 2019-09-15 DIAGNOSIS — R0600 Dyspnea, unspecified: Principal | ICD-10-CM

## 2019-09-15 DIAGNOSIS — Z20828 Contact with and (suspected) exposure to other viral communicable diseases: Principal | ICD-10-CM

## 2019-09-15 DIAGNOSIS — R0981 Nasal congestion: Principal | ICD-10-CM

## 2019-09-25 ENCOUNTER — Ambulatory Visit: Admit: 2019-09-25 | Discharge: 2019-09-26 | Payer: MEDICARE

## 2019-09-25 DIAGNOSIS — R062 Wheezing: Principal | ICD-10-CM

## 2019-09-25 DIAGNOSIS — I10 Essential (primary) hypertension: Principal | ICD-10-CM

## 2019-09-25 MED ORDER — TELMISARTAN 40 MG TABLET
ORAL_TABLET | Freq: Every day | ORAL | 3 refills | 90 days | Status: CP
Start: 2019-09-25 — End: 2020-09-24

## 2019-10-04 ENCOUNTER — Encounter: Admit: 2019-10-04 | Discharge: 2019-10-05 | Payer: MEDICARE

## 2019-10-04 DIAGNOSIS — J4520 Mild intermittent asthma, uncomplicated: Principal | ICD-10-CM

## 2019-10-04 MED ORDER — AEROCHAMBER MV SPACER
0 refills | 0 days | Status: CP
Start: 2019-10-04 — End: ?

## 2019-10-17 ENCOUNTER — Encounter: Admit: 2019-10-17 | Discharge: 2019-10-18 | Payer: MEDICARE

## 2019-10-17 ENCOUNTER — Encounter: Admit: 2019-10-17 | Discharge: 2019-10-18 | Payer: MEDICARE | Attending: Internal Medicine | Primary: Internal Medicine

## 2019-10-17 DIAGNOSIS — E785 Hyperlipidemia, unspecified: Secondary | ICD-10-CM

## 2019-10-17 DIAGNOSIS — E1169 Type 2 diabetes mellitus with other specified complication: Principal | ICD-10-CM

## 2019-10-17 DIAGNOSIS — R0602 Shortness of breath: Principal | ICD-10-CM

## 2019-10-17 MED ORDER — ALBUTEROL SULFATE HFA 90 MCG/ACTUATION AEROSOL INHALER
0 refills | 0 days | Status: CP
Start: 2019-10-17 — End: ?

## 2019-10-20 ENCOUNTER — Encounter: Admit: 2019-10-20 | Discharge: 2019-10-21 | Payer: MEDICARE

## 2019-10-23 ENCOUNTER — Ambulatory Visit: Admit: 2019-10-23 | Discharge: 2019-10-24 | Payer: MEDICARE

## 2019-10-24 DIAGNOSIS — Z0000 Encounter for general adult medical examination without abnormal findings: Principal | ICD-10-CM

## 2019-10-24 DIAGNOSIS — I10 Essential (primary) hypertension: Principal | ICD-10-CM

## 2019-10-24 DIAGNOSIS — Z7289 Other problems related to lifestyle: Principal | ICD-10-CM

## 2019-10-26 ENCOUNTER — Encounter: Admit: 2019-10-26 | Discharge: 2019-10-27 | Payer: MEDICARE

## 2019-10-26 DIAGNOSIS — Z0000 Encounter for general adult medical examination without abnormal findings: Principal | ICD-10-CM

## 2019-10-26 DIAGNOSIS — Z7289 Other problems related to lifestyle: Principal | ICD-10-CM

## 2019-10-26 DIAGNOSIS — I10 Essential (primary) hypertension: Principal | ICD-10-CM

## 2019-11-06 ENCOUNTER — Ambulatory Visit: Admit: 2019-11-06 | Discharge: 2019-11-07 | Payer: MEDICARE

## 2019-11-06 MED ORDER — FLUTICASONE PROPIONATE 110 MCG/ACTUATION HFA AEROSOL INHALER
Freq: Two times a day (BID) | RESPIRATORY_TRACT | 0 refills | 0 days | Status: CP
Start: 2019-11-06 — End: 2019-12-06

## 2019-11-08 ENCOUNTER — Encounter: Admit: 2019-11-08 | Discharge: 2019-11-09 | Payer: MEDICARE | Attending: Internal Medicine | Primary: Internal Medicine

## 2019-11-08 DIAGNOSIS — J4521 Mild intermittent asthma with (acute) exacerbation: Principal | ICD-10-CM

## 2019-11-08 DIAGNOSIS — I10 Essential (primary) hypertension: Principal | ICD-10-CM

## 2019-11-08 MED ORDER — PREDNISONE 20 MG TABLET
ORAL_TABLET | Freq: Every day | ORAL | 0 refills | 7 days | Status: CP
Start: 2019-11-08 — End: 2019-11-15

## 2019-11-12 ENCOUNTER — Ambulatory Visit
Admit: 2019-11-12 | Discharge: 2019-11-13 | Disposition: A | Discharge status: Home / Self Care | Payer: MEDICARE | Admitting: Internal Medicine

## 2019-11-13 MED ORDER — TELMISARTAN 40 MG TABLET
ORAL_TABLET | Freq: Every day | ORAL | 0 refills | 30 days | Status: CP
Start: 2019-11-13 — End: 2019-12-13

## 2019-11-14 MED ORDER — AMLODIPINE 5 MG TABLET
ORAL_TABLET | Freq: Every day | ORAL | 3 refills | 30.00000 days | Status: CP
Start: 2019-11-14 — End: 2020-03-13

## 2019-11-17 ENCOUNTER — Encounter: Admit: 2019-11-17 | Discharge: 2019-11-18 | Payer: MEDICARE

## 2019-11-17 DIAGNOSIS — I160 Hypertensive urgency: Principal | ICD-10-CM

## 2019-11-17 DIAGNOSIS — J810 Acute pulmonary edema: Principal | ICD-10-CM

## 2019-11-17 MED ORDER — HYDROCHLOROTHIAZIDE 12.5 MG TABLET
ORAL_TABLET | Freq: Every day | ORAL | 3 refills | 90 days | Status: CP
Start: 2019-11-17 — End: 2020-11-16

## 2019-11-20 ENCOUNTER — Encounter: Admit: 2019-11-20 | Discharge: 2019-11-21 | Payer: MEDICARE

## 2019-11-30 ENCOUNTER — Encounter: Admit: 2019-11-30 | Discharge: 2019-12-01 | Payer: MEDICARE

## 2019-11-30 DIAGNOSIS — I10 Essential (primary) hypertension: Principal | ICD-10-CM

## 2019-11-30 MED ORDER — TELMISARTAN 80 MG TABLET
ORAL_TABLET | Freq: Every day | ORAL | 3 refills | 90.00000 days | Status: CP
Start: 2019-11-30 — End: 2019-12-30

## 2019-12-11 ENCOUNTER — Encounter: Admit: 2019-12-11 | Discharge: 2019-12-12 | Payer: MEDICARE

## 2019-12-11 DIAGNOSIS — I359 Nonrheumatic aortic valve disorder, unspecified: Principal | ICD-10-CM

## 2019-12-11 DIAGNOSIS — E785 Hyperlipidemia, unspecified: Secondary | ICD-10-CM

## 2019-12-11 DIAGNOSIS — I340 Nonrheumatic mitral (valve) insufficiency: Principal | ICD-10-CM

## 2019-12-11 DIAGNOSIS — E1169 Type 2 diabetes mellitus with other specified complication: Secondary | ICD-10-CM

## 2019-12-11 DIAGNOSIS — I160 Hypertensive urgency: Principal | ICD-10-CM

## 2019-12-11 MED ORDER — LOSARTAN 100 MG-HYDROCHLOROTHIAZIDE 25 MG TABLET
ORAL_TABLET | Freq: Every day | ORAL | 11 refills | 30 days | Status: CP
Start: 2019-12-11 — End: 2020-12-10

## 2020-01-15 ENCOUNTER — Ambulatory Visit: Admit: 2020-01-15 | Discharge: 2020-01-16 | Payer: MEDICARE

## 2020-01-15 ENCOUNTER — Encounter: Admit: 2020-01-15 | Discharge: 2020-01-16 | Payer: MEDICARE

## 2020-01-15 ENCOUNTER — Encounter
Admit: 2020-01-15 | Discharge: 2020-01-16 | Payer: MEDICARE | Attending: Student in an Organized Health Care Education/Training Program | Primary: Student in an Organized Health Care Education/Training Program

## 2020-01-16 DIAGNOSIS — I701 Atherosclerosis of renal artery: Principal | ICD-10-CM

## 2020-01-29 ENCOUNTER — Encounter: Admit: 2020-01-29 | Discharge: 2020-01-30 | Payer: MEDICARE

## 2020-01-30 MED ORDER — CLOPIDOGREL 75 MG TABLET
ORAL_TABLET | Freq: Every day | ORAL | 11 refills | 30 days | Status: CP
Start: 2020-01-30 — End: ?
  Filled 2020-01-30: qty 30, 30d supply, fill #0

## 2020-01-30 MED ORDER — ASPIRIN 81 MG CHEWABLE TABLET
ORAL_TABLET | Freq: Every day | ORAL | 11 refills | 36 days | Status: CP
Start: 2020-01-30 — End: ?
  Filled 2020-01-30: qty 36, 36d supply, fill #0

## 2020-01-30 MED FILL — CLOPIDOGREL 75 MG TABLET: 30 days supply | Qty: 30 | Fill #0 | Status: AC

## 2020-01-30 MED FILL — ASPIRIN 81 MG CHEWABLE TABLET: 36 days supply | Qty: 36 | Fill #0 | Status: AC

## 2020-02-17 ENCOUNTER — Encounter: Admit: 2020-02-17 | Discharge: 2020-02-17 | Disposition: A | Discharge status: Home / Self Care | Payer: MEDICARE

## 2020-02-17 DIAGNOSIS — R079 Chest pain, unspecified: Principal | ICD-10-CM

## 2020-02-20 ENCOUNTER — Encounter: Admit: 2020-02-20 | Discharge: 2020-02-21 | Payer: MEDICARE | Attending: Internal Medicine | Primary: Internal Medicine

## 2020-02-20 DIAGNOSIS — R0789 Other chest pain: Principal | ICD-10-CM

## 2020-02-20 DIAGNOSIS — I150 Renovascular hypertension: Principal | ICD-10-CM

## 2020-03-04 ENCOUNTER — Ambulatory Visit
Admit: 2020-03-04 | Payer: MEDICARE | Attending: Student in an Organized Health Care Education/Training Program | Primary: Student in an Organized Health Care Education/Training Program

## 2020-03-08 MED ORDER — AMLODIPINE 5 MG TABLET
ORAL_TABLET | Freq: Every day | ORAL | 2 refills | 30.00000 days | Status: CP
Start: 2020-03-08 — End: 2020-07-06

## 2020-03-11 ENCOUNTER — Ambulatory Visit: Admit: 2020-03-11 | Discharge: 2020-03-12 | Payer: MEDICARE

## 2020-03-11 DIAGNOSIS — I701 Atherosclerosis of renal artery: Principal | ICD-10-CM

## 2020-03-11 DIAGNOSIS — E785 Hyperlipidemia, unspecified: Principal | ICD-10-CM

## 2020-04-02 ENCOUNTER — Encounter: Admit: 2020-04-02 | Discharge: 2020-04-03 | Payer: MEDICARE

## 2020-04-02 DIAGNOSIS — M7021 Olecranon bursitis, right elbow: Principal | ICD-10-CM

## 2020-05-01 ENCOUNTER — Encounter: Admit: 2020-05-01 | Discharge: 2020-05-02 | Payer: MEDICARE | Attending: Audiologist | Primary: Audiologist

## 2020-05-01 DIAGNOSIS — H903 Sensorineural hearing loss, bilateral: Principal | ICD-10-CM

## 2020-05-19 ENCOUNTER — Encounter: Admit: 2020-05-19 | Discharge: 2020-05-20 | Payer: MEDICARE

## 2020-05-19 ENCOUNTER — Encounter: Admit: 2020-05-19 | Discharge: 2020-05-20 | Payer: MEDICARE | Attending: Pediatrics | Primary: Pediatrics

## 2020-05-19 DIAGNOSIS — S9001XA Contusion of right ankle, initial encounter: Principal | ICD-10-CM

## 2020-05-19 DIAGNOSIS — S8991XA Unspecified injury of right lower leg, initial encounter: Principal | ICD-10-CM

## 2020-06-10 ENCOUNTER — Ambulatory Visit: Admit: 2020-06-10 | Discharge: 2020-06-11 | Payer: MEDICARE

## 2020-06-10 DIAGNOSIS — E785 Hyperlipidemia, unspecified: Principal | ICD-10-CM

## 2020-06-10 DIAGNOSIS — I10 Essential (primary) hypertension: Principal | ICD-10-CM

## 2020-06-10 DIAGNOSIS — E1169 Type 2 diabetes mellitus with other specified complication: Secondary | ICD-10-CM

## 2020-06-10 DIAGNOSIS — Z0000 Encounter for general adult medical examination without abnormal findings: Principal | ICD-10-CM

## 2020-07-11 DIAGNOSIS — Z1231 Encounter for screening mammogram for malignant neoplasm of breast: Principal | ICD-10-CM

## 2020-09-03 MED ORDER — AMLODIPINE 5 MG TABLET
ORAL_TABLET | Freq: Every day | ORAL | 1 refills | 30 days
Start: 2020-09-03 — End: 2021-01-01

## 2020-09-11 MED ORDER — AMLODIPINE 5 MG TABLET
ORAL_TABLET | Freq: Every day | ORAL | 1 refills | 30 days
Start: 2020-09-11 — End: 2021-01-09

## 2020-09-15 DIAGNOSIS — Z0000 Encounter for general adult medical examination without abnormal findings: Principal | ICD-10-CM

## 2020-09-15 DIAGNOSIS — E1169 Type 2 diabetes mellitus with other specified complication: Principal | ICD-10-CM

## 2020-09-15 DIAGNOSIS — E785 Hyperlipidemia, unspecified: Principal | ICD-10-CM

## 2020-09-15 DIAGNOSIS — I10 Essential (primary) hypertension: Principal | ICD-10-CM

## 2020-09-18 ENCOUNTER — Encounter: Admit: 2020-09-18 | Discharge: 2020-09-19 | Payer: MEDICARE

## 2020-09-18 DIAGNOSIS — I10 Essential (primary) hypertension: Principal | ICD-10-CM

## 2020-09-18 DIAGNOSIS — E1169 Type 2 diabetes mellitus with other specified complication: Principal | ICD-10-CM

## 2020-09-18 DIAGNOSIS — Z0000 Encounter for general adult medical examination without abnormal findings: Principal | ICD-10-CM

## 2020-09-18 DIAGNOSIS — E785 Hyperlipidemia, unspecified: Principal | ICD-10-CM

## 2020-09-24 ENCOUNTER — Encounter: Admit: 2020-09-24 | Discharge: 2020-09-25 | Payer: MEDICARE | Attending: Internal Medicine | Primary: Internal Medicine

## 2020-09-24 DIAGNOSIS — N904 Leukoplakia of vulva: Principal | ICD-10-CM

## 2020-09-24 DIAGNOSIS — I150 Renovascular hypertension: Principal | ICD-10-CM

## 2020-09-24 DIAGNOSIS — E1169 Type 2 diabetes mellitus with other specified complication: Principal | ICD-10-CM

## 2020-09-24 DIAGNOSIS — Z0000 Encounter for general adult medical examination without abnormal findings: Principal | ICD-10-CM

## 2020-09-24 DIAGNOSIS — E785 Hyperlipidemia, unspecified: Principal | ICD-10-CM

## 2020-09-24 MED ORDER — AMLODIPINE 5 MG TABLET
ORAL_TABLET | Freq: Every day | ORAL | 3 refills | 90 days | Status: CP
Start: 2020-09-24 — End: 2021-01-22

## 2020-10-07 ENCOUNTER — Encounter: Admit: 2020-10-07 | Discharge: 2020-10-08 | Payer: MEDICARE

## 2020-10-07 DIAGNOSIS — E782 Mixed hyperlipidemia: Principal | ICD-10-CM

## 2020-10-07 DIAGNOSIS — I10 Essential (primary) hypertension: Principal | ICD-10-CM

## 2020-10-07 DIAGNOSIS — I701 Atherosclerosis of renal artery: Principal | ICD-10-CM

## 2020-10-07 DIAGNOSIS — I150 Renovascular hypertension: Principal | ICD-10-CM

## 2020-10-07 DIAGNOSIS — I359 Nonrheumatic aortic valve disorder, unspecified: Principal | ICD-10-CM

## 2020-10-07 DIAGNOSIS — I340 Nonrheumatic mitral (valve) insufficiency: Principal | ICD-10-CM

## 2020-10-07 MED ORDER — REPATHA SURECLICK 140 MG/ML SUBCUTANEOUS PEN INJECTOR
SUBCUTANEOUS | 3 refills | 0.00000 days | Status: CP
Start: 2020-10-07 — End: ?
  Filled 2020-11-08: qty 6, 84d supply, fill #0

## 2020-10-09 DIAGNOSIS — E782 Mixed hyperlipidemia: Principal | ICD-10-CM

## 2020-10-15 NOTE — Unmapped (Signed)
[facility] Specialty Medication Onboarding    Specialty Medication: Repatha Sureclick 140 mg/ml pen  Prior Authorization: Approved   Financial Assistance: No - copay  <$25  Final Copay/Day Supply: $0.00 / 84 day supply    Insurance Restrictions: None     Notes to Pharmacist:     The triage team has completed the benefits investigation and has determined that the patient is able to fill this medication at [facility]. Please contact the patient to complete the onboarding or follow up with the prescribing physician as needed.

## 2020-10-17 NOTE — Unmapped (Signed)
[facility]   Patient Onboarding/Medication Counseling    Kayla Knight is a 80 y.o. female with hyperlipidemia who I am counseling today on initiation of therapy.  I am speaking to the patient.    Was a Nurse, learning disability used for this call? No    Verified patient's date of birth / HIPAA.    Specialty medication(s) to be sent: General Specialty: Repatha      Non-specialty medications/supplies to be sent: Higher education careers adviser      Medications not needed at this time: n/a         Repatha (evolocumab)    Medication & Administration     Dosage: Inject the contents of 1 pen (140mg ) under the skin every 2 weeks.    Administration: Administer under the skin of the abdomen, thigh or upper arm. Rotate sites with each injection.  ??? Injection instructions - Autoinjector:  o Remove 1 Repatha autoinjector from the refrigerator and let stand at room temperature for at least 30 minutes.  o Check the autoinjector for the following:  - Expiration date  - Absence of any cracks or damage  - The medicine is clear and colorless and does not contain any particles  - The orange cap is present and securely attached  o Choose your injection site and clean with an alcohol wipe. Allow to air dry completely.  o Pull the orange cap straight off and discard  o Pinch the skin (or stretch) with your thumb and fingers creating an area 2 inches wide  o Maintaining the pinch (or stretch) press the pen to your skin at a 90 degree angle. Firmly push the autoinjector down until the skin stops moving and the yellow safety guard is no longer visible.  o Do not touch the gray start button yet  o When you are ready to inject, press the gray start button. You will hear a click that signals the start of the injection  o Continue to press the pen to your skin and lift your thumb  o The injection may take up to 15 seconds. You will know the injection is complete when the medication window turns yellow. You may also hear a second click.  o Remove the pen from your skin and discard the pen in a sharps container.  o If there is blood at the injection site, press a cotton ball or gauze to the site. Do not rub the injection site.    Adherence/Missed dose instructions: Administer a missed dose within 7 days and resume your normal schedule.  If it has been more than 7 days and you inject every 2 weeks, skip the missed dose and resume your normal schedule..     Goals of Therapy     Lower cholesterol, prevention of cardiovascular events in patients with established cardiovascular disease    Side Effects & Monitoring Parameters   ??? Flu-like symptoms  ??? Signs of a common cold  ??? Back pain  ??? Injection site irritation  ??? Nose or throat irritation    The following side effects should be reported to the provider:  ??? Signs of an allergic reaction  ??? Signs of high blood sugar (confusion, drowsiness, increase thirst/hunger/urination, fast breathing, flushing)      Contraindications, Warnings, & Precautions     ??? Latex (the packaging of Repatha may contain natural rubber)    Drug/Food Interactions     ??? Medication list reviewed in Epic. The patient was instructed to inform the care  team before taking any new medications or supplements. No drug interactions identified.     Storage, Handling Precautions, & Disposal   ??? Repatha should be stored in the refrigerator. If necessary, Repatha may be kept at room temperature for no more than 30 days.  ??? Place used devices in a sharps container for disposal.      Current Medications (including OTC/herbals), Comorbidities and Allergies     Current Outpatient Medications   Medication Sig Dispense Refill   ??? amLODIPine (NORVASC) 5 MG tablet Take 1 tablet (5 mg total) by mouth daily. 90 tablet 3   ??? clopidogreL (PLAVIX) 75 mg tablet Take 1 tablet (75 mg total) by mouth daily. 30 tablet 11   ??? diclofenac sodium (VOLTAREN) 1 % gel Apply 2 g topically.     ??? evolocumab (REPATHA SURECLICK) 140 mg/mL PnIj Inject the contents of 1 pen (140 mg) under the skin every fourteen (14) days. 6 mL 3   ??? losartan-hydrochlorothiazide (HYZAAR) 100-25 mg per tablet Take 1 tablet by mouth daily. 30 tablet 11     No current facility-administered medications for this visit.       Allergies   Allergen Reactions   ??? Penicillins Itching and Swelling   ??? Lisinopril Cough   ??? Statins-Hmg-Coa Reductase Inhibitors      Ineffective at controlling cholesterol when tried in the past.   ??? Sulfa (Sulfonamide Antibiotics) Itching       Patient Active Problem List   Diagnosis   ??? Essential hypertension, benign   ??? Primary osteoarthritis of right knee   ??? Idiopathic peripheral neuropathy   ??? Non-rheumatic tricuspid valve insufficiency   ??? MR (mitral regurgitation)   ??? Mixed stress and urge urinary incontinence   ??? 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 Chest pain   ??? Neck pain   ??? Aortic valve disease   ??? Lipoma of torso   ??? Skin lesion   ??? Hearing loss   ??? Lichen sclerosus et atrophicus of the vulva   ??? 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 Renal artery stenosis, native, bilateral (CMS-HCC)   ??? Hyperlipidemia       Reviewed and up to date in Epic.    Appropriateness of Therapy     Is medication and dose appropriate based on diagnosis? Yes    Prescription has been clinically reviewed: Yes    Baseline Quality of Life Assessment      How many days over the past month did your hyperlipidemia  keep you from your normal activities? For example, brushing your teeth or getting up in the morning. Patient declined to answer    Financial Information     Medication Assistance provided: Prior Authorization    Anticipated copay of $0 reviewed with patient. Verified delivery address.    Delivery Information     Scheduled delivery date: 11/08/20    Expected start date: 11/08/20    Medication will be delivered via Same Day Courier to the prescription address in [facility].  This shipment will not require a signature. Explained the services we provide at [facility] and that each month we would call to set up refills.  Stressed importance of returning phone calls so that we could ensure they receive their medications in time each month.  Informed patient that we should be setting up refills 7-10 days prior to when they will run out of medication.  A pharmacist  will reach out to perform a clinical assessment periodically.  Informed patient that a welcome packet and a drug information handout will be sent.      Patient verbalized understanding of the above information as well as how to contact the pharmacy at [phone] option 4 with any questions/concerns.  The pharmacy is open Monday through Friday 8:30am-4:30pm.  A pharmacist is available 24/7 via pager to answer any clinical questions they may have.    Patient Specific Needs     - Does the patient have any physical, cognitive, or cultural barriers? No    - Patient prefers to have medications discussed with  Patient     - Is the patient or caregiver able to read and understand education materials at a high school level or above? Yes    - Patient's primary language is  English     - Is the patient high risk? No    - Does the patient require a Care Management Plan? No     - Does the patient require physician intervention or other additional services (i.e. nutrition, smoking cessation, social work)? No      Camillo Flaming  [facility] Specialty Pharmacist

## 2020-10-29 NOTE — Unmapped (Signed)
Patient/Caregiver called into to the main line of the [facility].   Care Coordinator transferred phone call to primary care provider's office.      Tish Begin - High Risk Care Coordinator  Complex Case Management Program  [facility]  [address], [address] [place], [place] [address]  P: [phone] F: [phone]

## 2020-11-07 MED ORDER — EMPTY CONTAINER
2 refills | 0 days
Start: 2020-11-07 — End: ?

## 2020-11-08 MED FILL — EMPTY CONTAINER: 120 days supply | Qty: 1 | Fill #0

## 2020-11-11 DIAGNOSIS — R1013 Epigastric pain: Principal | ICD-10-CM

## 2020-11-11 NOTE — Unmapped (Signed)
[name]  [facility] Internal Medicine Clinical Staff  I'd like a referral to see a Gastroenterologists because of incessant indigestion and acid reflux that has plagued me prior to our last visit. I just forgot to ask you about it.     My dermatology appt. is 03./09/23.     Thank you kindly,     Theda Belfast. [name]

## 2020-11-11 NOTE — Unmapped (Signed)
Addended by: Mosetta Anis on: 11/11/2020 09:59 AM     Modules accepted: Orders

## 2020-11-13 ENCOUNTER — Encounter: Admit: 2020-11-13 | Discharge: 2020-11-14 | Payer: MEDICARE | Attending: Internal Medicine | Primary: Internal Medicine

## 2020-11-13 DIAGNOSIS — R140 Abdominal distension (gaseous): Principal | ICD-10-CM

## 2020-11-13 DIAGNOSIS — R1013 Epigastric pain: Principal | ICD-10-CM

## 2020-11-13 MED ORDER — FAMOTIDINE 20 MG TABLET
ORAL_TABLET | Freq: Two times a day (BID) | ORAL | 4 refills | 30.00000 days | Status: CP
Start: 2020-11-13 — End: 2020-12-13

## 2020-11-13 NOTE — Unmapped (Signed)
Assessment and Plan  1. Dyspepsia -possible gastritis,pud,gerd,gastric dysmotility.  Ok for trial fo famotidine. EGD to r/o pud.  Ultrasound of abdomen to r/o biliary issue.Written info given on foods that might cause gas-FODMAP.    2. Bloating -as above       Orders Placed This Encounter   Procedures   ??? US Abdomen Complete   ??? EGD   Procedure,prep risks and alternatives explained at length    Signs and symptoms that should prompt return sooner than scheduled were reviewed with the patient.  Visit time: In excess of 45 minutes, more than 50% of which was spent face-to-face in counseling and education activities regarding the diagnoses above.    HPI    Chief Complaint  Chief Complaint   Patient presents with   ??? dyspepsia     GERD     Kayla Knight presents w c/o dyspepsia,belching,bloating for past few months. Occurs regardless of diet. No particular food as trigger. Will feel uncomfortable,w loud frequent belching. Some burning in stomach. At times fees like food I snot going to stay down. No vomiting has occurred. At night when lays down stomach can keep from falling asleep-feels need to sit up. Does eat some spicy food.  No dysphagia,wt loss,melena ,anemia,or abnormal lft's. No significant carbonated drinks,occasional ginger ale. No artificial sweeteners. No problems w dairy. 1 glass of wine few times per wk. Never previous heavy alcohol consumption. Remote smoker,none 23 years. No significant nsaid use. Has gained some wt w pandemic about 15 lbs w less exercise. Has at times tendency to over eat.     Allergies:  Penicillins, Lisinopril, Statins-hmg-coa reductase inhibitors, and Sulfa (sulfonamide antibiotics)    Medications:   Outpatient Medications Prior to Visit   Medication Sig Dispense Refill   ??? amLODIPine (NORVASC) 5 MG tablet Take 1 tablet (5 mg total) by mouth daily. 90 tablet 3   ??? clopidogreL (PLAVIX) 75 mg tablet Take 1 tablet (75 mg total) by mouth daily. 30 tablet 11   ??? diclofenac sodium (VOLTAREN) 1 % gel Apply 2 g topically.     ??? empty container (SHARPS-A-GATOR DISPOSAL SYSTEM) Misc Use as directed for sharps disposal 1 each 2   ??? evolocumab (REPATHA SURECLICK) 140 mg/mL PnIj Inject the contents of 1 pen (140 mg) under the skin every fourteen (14) days. 6 mL 3   ??? losartan-hydrochlorothiazide (HYZAAR) 100-25 mg per tablet Take 1 tablet by mouth daily. 30 tablet 11     No facility-administered medications prior to visit.       Medical History:  Past Medical History:   Diagnosis Date   ??? Anemia     past history, over 40 yrs ago   ??? Arthritis     hand, knees, joints   ??? Asthma     past history, no recent attacks   ??? Asthma     patient stated due to asthmatic medication   ??? Cataract 2008    Surgery   ??? Diabetes mellitus (CMS-HCC)     Type II, on 9/15 patient stated she was not longer diabetic, not taking meds   ??? Dry eye    ??? Hearing impairment    ??? Heart murmur 2016    Dr. Verdie Shire @ [facility]   ??? Heart valve disease     Heart mumur, pt reports diagnosis 10 yrs ago (05/10/20)   ??? Hypertension     pt reports taking meds since about 1995   ??? Neuromuscular disorder (CMS-HCC)  2014    Feet & Legs   ??? Tear of meniscus of knee     4.5 yrs ago   ??? Urinary incontinence     seen a doctor about nighttime bathroom use (adressed in 2019)       Surgical History:  Past Surgical History:   Procedure Laterality Date   ??? CATARACT EXTRACTION W/ INTRAOCULAR LENS IMPLANT Left 02/27/2016   ??? EYE SURGERY  01/30/16    Cataract surgery is scheduled.   ??? JOINT REPLACEMENT  Knee   ??? KNEE CARTILAGE SURGERY Right 2007, 2008    x 2   ??? KNEE SURGERY      pt reports total left knee joint replacement surgery around 2016, 2017   ??? PLANTAR FASCIECTOMY Left    ??? PR REVASCULARIZE FEM/POP ARTERY,ANGIOPLASTY/STENT N/A 01/29/2020    Procedure: Peripheral Angiography W Intervetion;  Surgeon: Alvira Philips, MD;  Location: [facility] CATH;  Service: Cardiology   ??? PR XCAPSL CTRC RMVL INSJ IO LENS PROSTH W/O ECP Right 02/13/2016 Procedure: EXTRACAPSULAR CATARACT REMOVAL W/INSERTION OF INTRAOCULAR LENS PROSTHESIS, MANUAL OR MECHANICAL TECHNIQUE;  Surgeon: Garey Ham, MD;  Location: [facility] OR [facility];  Service: Ophthalmology   ??? PR XCAPSL CTRC RMVL INSJ IO LENS PROSTH W/O ECP Left 02/27/2016    Procedure: EXTRACAPSULAR CATARACT REMOVAL W/INSERTION OF INTRAOCULAR LENS PROSTHESIS, MANUAL OR MECHANICAL TECHNIQUE;  Surgeon: Garey Ham, MD;  [facility]: [facility] OR [facility];  Service: Ophthalmology   ??? RENAL ARTERY STENT  01/11/2020   ??? ROOT CANAL     ??? TUBAL LIGATION  1973    [facility]   ??? WISDOM TOOTH EXTRACTION         Social History:  Tobacco use:   reports that she quit smoking about 36 years ago. Her smoking use included cigarettes. She smoked 0.00 packs per day for 0.00 years. She has never used smokeless tobacco.  Alcohol use:   reports current alcohol use of about 4.0 standard drinks of alcohol per week.  Drug use:  reports no history of drug use.      Family History:  Family History   Problem Relation Age of Onset   ??? Heart disease Mother    ??? Hypertension Mother    ??? Dementia Mother    ??? Arthritis Mother    ??? Heart disease Father    ??? Thyroid disease Father    ??? Glaucoma Father    ??? Alcohol abuse Father    ??? Heart attack Father 75   ??? Arthritis Father    ??? Hypertension Father         Massive heart attack   ??? Arthritis Sister    ??? Glaucoma Sister    ??? Hypertension Sister    ??? Diabetes Sister    ??? Diabetes Daughter    ??? Diabetes Paternal Aunt                                                    ??? Arthritis Sister         Noticeable in extremities   ??? COPD Sister    ??? Depression Sister    ??? Hypertension Sister    ??? Glaucoma Sister    ??? Asthma Daughter         Florencia Reasons   ??? Hypertension Daughter    ???  Miscarriages / [place] Daughter    ??? Cancer Maternal Aunt         Died more than 73yrs ago   ??? Early death Maternal Aunt    ??? Diabetes Paternal Aunt         4 Aunts   ??? Kidney disease Paternal Aunt          Review of Systems:  ROS:   General ROS: negative for - fever,weight loss ,+fatigue  HEENT ROS: negative for sore throat, or rhinitis,+hearing loss,ear ringing,blurry vision  Respiratory ROS: no cough, shortness of breath, or wheezing  Cardiovascular ROS: no chest pain or dyspnea on exertion  Gastrointestinal ROS: no abdominal pain, change in bowel habits, or black or bloody stools  Genito-Urinary ROS: no dysuria or hematuria  Musculoskeletal ROS: +joint pain,problems walking  Dermatological ROS: negative for rash or new lesions  Neuro:no headaches      Vitals:    11/13/20 0912   BP: 150/82   Pulse: 67   Resp: 18   Temp: 36.4 ??C (97.5 ??F)   TempSrc: Temporal   SpO2: 99%   Weight: 78 kg (172 lb)   Height: 167.6 cm (5' 6)       Physical Exam  General: alert, oriented, no distress  EYES: Anicteric sclerae.oropharynx pink,moist  NECK: no lymphadenopathy,   RESP: Relaxed respiratory effort. Clear to auscultation without wheezes or crackles.    CV: Regular rate and rhythm. Normal S1 and S2. No murmurs or gallops.  Marland Kitchen   ABDO:soft,nontender,no hsm,no guarding or rebound,mild upper distension  EXT:  No peripheral edema  Skin:no rash  Neuro:no focal deficits

## 2020-12-10 ENCOUNTER — Encounter: Admit: 2020-12-10 | Discharge: 2020-12-11 | Payer: MEDICARE

## 2020-12-10 DIAGNOSIS — R197 Diarrhea, unspecified: Principal | ICD-10-CM

## 2020-12-10 DIAGNOSIS — R002 Palpitations: Principal | ICD-10-CM

## 2020-12-10 DIAGNOSIS — R112 Nausea with vomiting, unspecified: Principal | ICD-10-CM

## 2020-12-10 MED ORDER — ONDANSETRON 4 MG DISINTEGRATING TABLET
ORAL_TABLET | Freq: Three times a day (TID) | ORAL | 0 refills | 4 days | Status: CP | PRN
Start: 2020-12-10 — End: 2020-12-13

## 2020-12-10 NOTE — Unmapped (Deleted)
Marland Kitchen  [facility]:  Provider Note    Sharyn Lull, MD  Internal Medicine & Pediatrics       HPI   SUBJECTIVE:   Kayla Knight is a 80 y.o. female who presents with No chief complaint on file.  Marland Kitchen      History     Past Medical History:   Diagnosis Date   ??? Anemia     past history, over 40 yrs ago   ??? Arthritis     hand, knees, joints   ??? Asthma     past history, no recent attacks   ??? Asthma     patient stated due to asthmatic medication   ??? Cataract 2008    Surgery   ??? Diabetes mellitus (CMS-HCC)     Type II, on 9/15 patient stated she was not longer diabetic, not taking meds   ??? Dry eye    ??? Hearing impairment    ??? Heart murmur 2016    Dr. Verdie Shire @ [facility]   ??? Heart valve disease     Heart mumur, pt reports diagnosis 10 yrs ago (05/10/20)   ??? Hypertension     pt reports taking meds since about 1995   ??? Neuromuscular disorder (CMS-HCC) 2014    Feet & Legs   ??? Tear of meniscus of knee     4.5 yrs ago   ??? Urinary incontinence     seen a doctor about nighttime bathroom use (adressed in 2019)       Social history and Family hx were reviewed in the EHR today.      Family history is not contributory to this visit except as documented in the HPI.     Social History     Tobacco Use   ??? Smoking status: Former Smoker     Packs/day: 0.00     Years: 0.00     Pack years: 0.00     Types: Cigarettes     Quit date: 05/19/1984     Years since quitting: 36.5   ??? Smokeless tobacco: Never Used   Vaping Use   ??? Vaping Use: Never used   Substance Use Topics   ??? Alcohol use: Yes     Alcohol/week: 4.0 standard drinks     Types: 3 Glasses of wine, 1 Cans of beer per week     Comment: Occasional   ??? Drug use: Never       Medication were reconciled by our nurses and reviewed by me.   PHQ-2 Score:      PHQ-9 Score:      Inocente Salles Score:      {select_status_or_delete_smartlist:64641}  ROS     ROS is negative for 10 systems except as noted in the HPI.       Physical Exam     Wt Readings from Last 3 Encounters:   11/13/20 78 kg (172 lb)   10/07/20 76.8 kg (169 lb 4.8 oz)   09/24/20 76.2 kg (168 lb)     Temp Readings from Last 3 Encounters:   11/13/20 36.4 ??C (97.5 ??F) (Temporal)   09/24/20 36.6 ??C (97.9 ??F) (Oral)   05/19/20 36.3 ??C (97.3 ??F)     BP Readings from Last 3 Encounters:   11/13/20 150/82   10/07/20 150/68   09/24/20 138/72     Pulse Readings from Last 3 Encounters:   11/13/20 67   10/07/20 69   09/24/20 71     {  AMb Oximetry:[id number]}    O2 saturation obtained while patient was sitting See Vitals form.    Constitutional:  Looks comfortable, no acute distress,    HEAD:   Symmetrical and without evidence of trauma.  Eyes:EOM normal with conjugate vision,  PERRLA.  Marland Kitchenconjunctiva normal bilaterally.  HENT:       Lymphatic/ Hematologic / Endocrine:        RESPIRATORY:  Normal inspiratory effort. chest is clear to percussion and auscultation- no crackles or wheezes including FVC maneuver  CARDIOVASCULAR  Normal rate of     without ectopy- Normal heart sounds.    GI:    GU:       SKIN: Skin intact  no rash -   MUSCULOSKELETAL:  No edema, no deformities no inflamed joints    NEUROLOGIC:  Alert & oriented x 3, no focal neuro deficits. Gets out of the chair without difficulty and gait is normal.   PSYCHIATRIC:  Speech and behavior appropriate .Attention normal,  Affect seems appropriate.      ED Course     @EDMEDS @  No results found for this visit on 12/10/20.    No results found.      ED Assessment/Plan     Final diagnoses:   None     Discussed the new prescription noted above, including potential side effects, drug interactions, instructions for taking the medication, and the consequences of not taking it.  Patient verbalized an understanding of these instructions and had not further questions.    There are no Patient Instructions on file for this visit.  @VPEvisit @  *This clinic note was created using Scientist, clinical (histocompatibility and immunogenetics).  Therefore, there may be occasional word context mistakes despite proofreading.    Go to www.goodrx.com to look up your medications. This will give you a list of where you can find your prescriptions at the most affordable prices.  This online prescription service is often less expensive than insurance plans.      I discussed my evaluation of the patient's symptoms, my clinical impressions, the differential diagnosis and my proposed treatment plan with the patient.   We discussed anticipatory guidance, follow-up, and reasons to return for further care.   The patient expresses understanding this information, they are agreeable and comfortable with the discharge plan. They acknowledged no further questions or concerns at this time. They are comfortable at the time of their discharge from our office today.

## 2020-12-10 NOTE — Unmapped (Signed)
Spoke with patient again and was advised to go to urgent care today to be evaluated due to severe diarrhea/vomiting. Advised her that she may need fluids if she is dehydrated/weak. She verbalized understanding and will go.

## 2020-12-10 NOTE — Unmapped (Signed)
No alarming signs on exam. Your vitals were stable. EKG unchanged.    1. Zofran for nausea and vomiting as needed.   2. Keep hydrated, you urine should be clear to pale yellow in color.   3. Bland diet, advance as tolerated.     Please follow up with PCP/GI within the week if symptoms not improving.     If having irregular heart beat feeling every time you exert, please follow up with PCP/cardiology for further evaluation.    If having chest pain, shortness of breath, go to the emergency department for further evaluation.    Monitor for any worsening of symptoms, nausea or vomiting not controlled by medication, worsening abdominal pain, fever, go to the emergency department for further evaluation needed.

## 2020-12-10 NOTE — Unmapped (Signed)
[facility] AT [address] [place]  [facility] at [facility]:  Provider Note  12/10/2020    Patient Name: Kayla Knight    Date of Birth: February 26, 1941    MRN: [id number]     SUBJECTIVE:   Date of Service: 12/10/20     HPI: 80 y.o., female comes in for nausea, vomiting, diarrhea since yesterday. States she was having 3-5 episodes of diarrhea/hour yesterday. Denies melena, hematochezia. Had nausea with 2 episodes of vomiting. This morning, woke up with 1 episode of stool incontinence, but has not had further incontinence episodes. Denies loss of bladder control. Still having nausea, but was able to tolerate oral intake today. States while cleaning bed sheets, felt an episode of palpitation/irregular heart beat for a few mins. Denies associated chest pain, shortness of breath, weakness, dizziness, lightheadedness. This has since completely resolved. She walks 1-2 miles regularly without exertional chest pain, exertional fatigue, dyspnea on exertion. She has baseline GERD/epigastric pain that has ben present for a few months and currently being evaluated by GI. Denies worsening of symptoms. Denies fever, chills, body aches.     Past Medical History:  Past Medical History:   Diagnosis Date   ??? Anemia     past history, over 40 yrs ago   ??? Arthritis     hand, knees, joints   ??? Asthma     past history, no recent attacks   ??? Asthma     patient stated due to asthmatic medication   ??? Cataract 2008    Surgery   ??? Diabetes mellitus (CMS-HCC)     Type II, on 9/15 patient stated she was not longer diabetic, not taking meds   ??? Dry eye    ??? Hearing impairment    ??? Heart murmur 2016    Dr. Verdie Shire @ [facility]   ??? Heart valve disease     Heart mumur, pt reports diagnosis 10 yrs ago (05/10/20)   ??? Hypertension     pt reports taking meds since about 1995   ??? Neuromuscular disorder (CMS-HCC) 2014    Feet & Legs   ??? Tear of meniscus of knee     4.5 yrs ago   ??? Urinary incontinence     seen a doctor about nighttime bathroom use (adressed in 2019)         Past Surgical History:  Past Surgical History:   Procedure Laterality Date   ??? CATARACT EXTRACTION W/ INTRAOCULAR LENS IMPLANT Left 02/27/2016   ??? EYE SURGERY  01/30/16    Cataract surgery is scheduled.   ??? JOINT REPLACEMENT  Knee   ??? KNEE CARTILAGE SURGERY Right 2007, 2008    x 2   ??? KNEE SURGERY      pt reports total left knee joint replacement surgery around 2016, 2017   ??? PLANTAR FASCIECTOMY Left    ??? PR REVASCULARIZE FEM/POP ARTERY,ANGIOPLASTY/STENT N/A 01/29/2020    Procedure: Peripheral Angiography W Intervetion;  Surgeon: Alvira Philips, MD;  Location: [facility] CATH;  Service: Cardiology   ??? PR XCAPSL CTRC RMVL INSJ IO LENS PROSTH W/O ECP Right 02/13/2016    Procedure: EXTRACAPSULAR CATARACT REMOVAL W/INSERTION OF INTRAOCULAR LENS PROSTHESIS, MANUAL OR MECHANICAL TECHNIQUE;  Surgeon: Garey Ham, MD;  [facility]: [facility] OR [facility];  Service: Ophthalmology   ??? PR XCAPSL CTRC RMVL INSJ IO LENS PROSTH W/O ECP Left 02/27/2016    Procedure: EXTRACAPSULAR CATARACT REMOVAL W/INSERTION OF INTRAOCULAR LENS PROSTHESIS, MANUAL OR MECHANICAL TECHNIQUE;  Surgeon: Garey Ham, MD;  [facility]: [facility] OR [facility];  Service: Ophthalmology   ??? RENAL ARTERY STENT  01/11/2020   ??? ROOT CANAL     ??? TUBAL LIGATION  1973    [facility]   ??? WISDOM TOOTH EXTRACTION          Medications:  Prior to Admission medications    Medication Sig Start Date End Date Taking? Authorizing Provider   amLODIPine (NORVASC) 5 MG tablet Take 1 tablet (5 mg total) by mouth daily. 09/24/20 01/22/21 Yes Virgina Evener, MD   clopidogreL (PLAVIX) 75 mg tablet Take 1 tablet (75 mg total) by mouth daily. 01/30/20  Yes Melanee Spry, AGNP   diclofenac sodium (VOLTAREN) 1 % gel Apply 2 g topically. 04/12/20 04/12/21 Yes Historical Provider, MD   evolocumab (REPATHA SURECLICK) 140 mg/mL PnIj Inject the contents of 1 pen (140 mg) under the skin every fourteen (14) days. 10/07/20  Yes Thelsa Yancey Flemings, MD   famotidine (PEPCID) 20 MG tablet Take 1 tablet (20 mg total) by mouth Two (2) times a day. 11/13/20 12/13/20 Yes Jarvis Morgan, MD   losartan-hydrochlorothiazide [facility]) 100-25 mg per tablet Take 1 tablet by mouth daily. 12/11/19 12/10/20 Yes Thelsa Yancey Flemings, MD   empty container [facility] DISPOSAL SYSTEM) Misc Use as directed for sharps disposal 11/07/20          Allergies:  Allergies   Allergen Reactions   ??? Penicillins Itching and Swelling   ??? Lisinopril Cough   ??? Statins-Hmg-Coa Reductase Inhibitors      Ineffective at controlling cholesterol when tried in the past.   ??? Sulfa (Sulfonamide Antibiotics) Itching        ROS:  10 point ROS reviewed and negative unless otherwise specified in HPI.    OBJECTIVE:  Physical Exam:  Vitals:    12/10/20 1440   BP: 158/80   Pulse: 73   Resp: 14   Temp: 36.8 ??C (98.2 ??F)   SpO2: 98%       Physical Exam  Constitutional:       General: She is not in acute distress.     Appearance: Normal appearance. She is not ill-appearing or toxic-appearing.   HENT:      Head: Normocephalic and atraumatic.   Eyes:      General: Lids are normal.      Extraocular Movements: Extraocular movements intact.      Conjunctiva/sclera: Conjunctivae normal.      Pupils: Pupils are equal, round, and reactive to light.   Cardiovascular:      Rate and Rhythm: Normal rate and regular rhythm.      Heart sounds: Murmur heard.     No friction rub. No gallop.   Pulmonary:      Effort: Pulmonary effort is normal.   Abdominal:      General: Bowel sounds are normal.      Palpations: Abdomen is soft.      Tenderness: There is abdominal tenderness in the epigastric area. There is no guarding or rebound. Negative signs include Murphy's sign.   Musculoskeletal:      Cervical back: Normal range of motion and neck supple.   Skin:     General: Skin is warm and dry.   Neurological:      Mental Status: She is alert and oriented to person, place, and time.          Lab Results: (Lab results reviewed)   No results found for this or  any previous visit (from the past 168 hour(s)).    Radiology Results:  No results found.     ASSESSMENT:    Diagnoses:  Final diagnoses:   Intermittent palpitations (Primary)   Nausea vomiting and diarrhea       PLAN:  Stable vitals without tachycardia, hypotension. Patient well appearing, nontoxic. RRR, +m, no r/g. LCTAB. Abdomen soft, +BS, mild epigastric pain without guarding or rebound. Patient no longer with palpitations. Diarrhea improved. Has tolerated oral intake, though still with slight nausea. Will obtain EKG for further evaluation.    EKG sinus rhythm with premature supraventricular complexes, 67bpm, no ST changes compared to prior.     Did offer CBC, CMP, lipase given current symptoms. However, after discussion, given no significant change in chronic epigastric pain, we will defer for now. Given palpitations have now resolved, discussed to monitor closely if symptoms return, to follow up with PCP/cardiology for further evaluation. Discussed symptomatic treatment with zofran, bland diet, hydration. Return precautions given. Patient expresses understanding and agrees to plan.      PHQ-2 Score: 2    PHQ-9 Score:      Edinburgh Score:      Screening complete, no depression identified / no further action needed today    This note was transcribed using Dragon voice recognition software, and may contain inadvertent misspellings or incorrect transcriptions

## 2020-12-10 NOTE — Unmapped (Signed)
Spoke with patient. She states that she recently had dental surgery and had to stop plavix. She started on the famotidine for the dyspepsia but did not help. She states past week she has had some vomiting and diarrhea. This morning she woke up in a puddle of bile per patient. She did have toast and 2 boiled eggs and 1/2 cup of juice this morning but still feels nauseous. She denies any sob but states she feels like she has irregular heart beat. She has a hx of heart murmur also.   So you recommend urgent care at this time?

## 2020-12-10 NOTE — Unmapped (Signed)
Malkin pt reports severe diarrhea overnight, weakness and experiencing an irregular heartbeat. Pt had to stop meds for dental surgery on 3/10, not sure if that is cause. Please advise.

## 2020-12-13 ENCOUNTER — Ambulatory Visit: Admit: 2020-12-13 | Discharge: 2020-12-14 | Payer: MEDICARE

## 2020-12-25 DIAGNOSIS — E782 Mixed hyperlipidemia: Principal | ICD-10-CM

## 2020-12-31 NOTE — Unmapped (Signed)
[facility] Clinical Assessment & Refill Coordination Note    Kayla Knight, [place]: 1941-02-07  Phone: [phone] (home)     All above HIPAA information was verified with patient.     Was a Nurse, learning disability used for this call? No    Specialty Medication(s):   General Specialty: Repatha     Current Outpatient Medications   Medication Sig Dispense Refill   ??? amLODIPine (NORVASC) 5 MG tablet Take 1 tablet (5 mg total) by mouth daily. 90 tablet 3   ??? clopidogreL (PLAVIX) 75 mg tablet Take 1 tablet (75 mg total) by mouth daily. 30 tablet 11   ??? diclofenac sodium (VOLTAREN) 1 % gel Apply 2 g topically.     ??? empty container (SHARPS-A-GATOR DISPOSAL SYSTEM) Misc Use as directed for sharps disposal 1 each 2   ??? evolocumab (REPATHA SURECLICK) 140 mg/mL PnIj Inject the contents of 1 pen (140 mg) under the skin every fourteen (14) days. 6 mL 3   ??? famotidine (PEPCID) 20 MG tablet Take 1 tablet (20 mg total) by mouth Two (2) times a day. 60 tablet 4   ??? losartan-hydrochlorothiazide (HYZAAR) 100-25 mg per tablet Take 1 tablet by mouth daily. 30 tablet 11     No current facility-administered medications for this visit.        Changes to medications: Kashmir reports no changes at this time.    Allergies   Allergen Reactions   ??? Penicillins Itching and Swelling   ??? Lisinopril Cough   ??? Statins-Hmg-Coa Reductase Inhibitors      Ineffective at controlling cholesterol when tried in the past.   ??? Sulfa (Sulfonamide Antibiotics) Itching       Changes to allergies: No    SPECIALTY MEDICATION ADHERENCE     Repatha 140 mg/ml: 9 days of medicine on hand       Medication Adherence    Patient reported X missed doses in the last month: 0  Specialty Medication: Repatha 140 mg/mL  Informant: patient          Specialty medication(s) dose(s) confirmed: Regimen is correct and unchanged.     Are there any concerns with adherence? No    Adherence counseling provided? Not needed    CLINICAL MANAGEMENT AND INTERVENTION Clinical Benefit Assessment:    Do you feel the medicine is effective or helping your condition? Yes    Clinical Benefit counseling provided? Not needed    Adverse Effects Assessment:    Are you experiencing any side effects? No    Are you experiencing difficulty administering your medicine? Yes, patient reports she had difficulty with 2 pens due to injection technique. She was unable to use those so will need an early fill.. Medication administration counseling provided: We discussed injection technique and Mrs Poliquin reports she has it figured out now. Encouraged her to call [facility] if she continues to have and difficulty or questions.    Quality of Life Assessment:    How many days over the past month did your hyperlipidemia  keep you from your normal activities? For example, brushing your teeth or getting up in the morning. Patient declined to answer    Have you discussed this with your provider? Not needed    Acute Infection Status:    Acute infections noted within Epic:  No active infections  Patient reported infection: None    Therapy Appropriateness:    Is therapy appropriate? Yes, therapy is appropriate and should be continued    DISEASE/MEDICATION-SPECIFIC  INFORMATION      For patients on injectable medications: Patient currently has 0 doses left.  Next injection is scheduled for 01/09/21.    PATIENT SPECIFIC NEEDS     - Does the patient have any physical, cognitive, or cultural barriers? No    - Is the patient high risk? No    - Does the patient require a Care Management Plan? No     - Does the patient require physician intervention or other additional services (i.e. nutrition, smoking cessation, social work)? No      SHIPPING     Specialty Medication(s) to be Shipped:   General Specialty: Repatha    Other medication(s) to be shipped: No additional medications requested for fill at this time     Changes to insurance: Yes: New plan on file. PA approved and test claim show $4 for 84 day supply. Patient informed and approves copay.    Delivery Scheduled: Yes, Expected medication delivery date: 01/02/21.     Medication will be delivered via Same Day Courier to the confirmed prescription address in [facility].    The patient will receive a drug information handout for each medication shipped and additional FDA Medication Guides as required.  Verified that patient has previously received a Conservation officer, historic buildings and a Surveyor, mining.    All of the patient's questions and concerns have been addressed.    Camillo Flaming   [facility] Specialty Pharmacist

## 2021-01-03 MED FILL — REPATHA SURECLICK 140 MG/ML SUBCUTANEOUS PEN INJECTOR: SUBCUTANEOUS | 84 days supply | Qty: 6 | Fill #1

## 2021-01-06 ENCOUNTER — Ambulatory Visit: Admit: 2021-01-06 | Discharge: 2021-01-07 | Payer: MEDICARE | Attending: Internal Medicine | Primary: Internal Medicine

## 2021-01-06 ENCOUNTER — Ambulatory Visit: Admit: 2021-01-06 | Discharge: 2021-01-07 | Payer: MEDICARE

## 2021-01-06 DIAGNOSIS — K146 Glossodynia: Principal | ICD-10-CM

## 2021-01-06 DIAGNOSIS — I10 Essential (primary) hypertension: Principal | ICD-10-CM

## 2021-01-06 NOTE — Unmapped (Signed)
Patient reporting elevated BP, and a strawberry tongue or feeling like there are bumps along her tongue since starting Repatha.  Denies throat or tongue swelling, angioedema, or other signs and symptoms of allergic reaction.  I schedule her for 1120a with Dr. Luz Brazen, but advise her to give her cardiologist a call to schedule with them since her Dr. Dayton Bailiff is the prescribing provider.   She will call back and cancel her appointment with Dr. Luz Brazen if able to be seen at her cardiologist today.

## 2021-01-06 NOTE — Unmapped (Signed)
Kayla Knight,    Patient called back to cancel apt today at [facility], she is going to see her cardiologist instead.    Shonia Skilling

## 2021-01-06 NOTE — Unmapped (Signed)
DIVISION OF CARDIOLOGY  [facility] [place]        Date of Service: 01/06/2021      PCP: Referring Provider:   Dora Sims, MD  [address] [facility] Internal Medicine  [place] [address]  Phone: [phone]  Fax: [phone] Kayla Evener, MD  [address] [facility]  [place],  [place] [address]  Phone: [phone]  Fax: [phone]     ASSESSMENT & PLAN:   Kayla Knight is a 80 y.o. female w/PMHx of HTN, HLD who presents for further evaluation of tongue soreness.    Hypertension, unspecified type  Patient reports SBP >170 at home. Her blood pressures are relatively controlled. Given the patient's aversion to medication, would be ideal to have better clarification of how her BP changes over the course of the day and see whether she has consistently high BPs >160. Would likely increase amlodipine and consider spironolactone as necessary.   -     24 hour blood pressure monitor    Soreness of tongue  Patient with mild erythema at tip of tongue and bumps on the tongue. Does not appear consistent with rash or viral etiology and not consistent with transient papillitis. Patient was thinking this was due to her Repatha, very low risk of this being the case. Overall, suspect her soreness and redness may be related to inflammation from tongue scraping but unclear. Her symptoms appear to be improving while she has abstained from tongue scraping for a few days. No further intervention at this time. Patient to follow up with PCP if symptoms worsen/change.      Lab Results   Component Value Date    CHOL 263 (H) 09/18/2020    CHOL 243 (H) 06/10/2020    CHOL 250 (H) 10/26/2019     Lab Results   Component Value Date    HDL 53 09/18/2020    HDL 50 06/10/2020    HDL 53 10/26/2019     Lab Results   Component Value Date    LDL 190 (H) 09/18/2020    LDL 162 (H) 06/10/2020    LDL 176 (H) 10/26/2019     Lab Results   Component Value Date    VLDL 20 09/18/2020    VLDL 02.7 06/10/2020    VLDL 21 (L) 10/26/2019     Lab Results   Component Value Date    CHOLHDLRATIO 5.0 (H) 09/18/2020    CHOLHDLRATIO 4.9 (H) 06/10/2020    CHOLHDLRATIO 4.7 (H) 10/26/2019     Lab Results   Component Value Date    TRIG 100 09/18/2020    TRIG 154 (H) 06/10/2020    TRIG 105 10/26/2019       Lab Results   Component Value Date    A1C 6.4 (H) 09/18/2020     The 10-yr ASCVD Risk score [name], et al., 2013) failed to calculate due to the following reason:  The 2013 10-yr ASCVD risk score is only valid if the patient does not have prior/existing clinical ASCVD (myocardial infarction, stroke, CABG, coronary angioplasty, angina or peripheral arterial disease, coronary atherosclerosis, ischemic heart disease, or cerebrovascular disease). The patient has prior/existing ASCVD.  Has Peripheral Arterial Disease      Return to clinic in 4mo w/Kayla Knight    SUBJECTIVE:        Reason for Consultation: tongue soreness, HTN    History of Present Illness: Ms.  Knight is a 80 y.o. female w/PMHx of HTN, HLD who presents for concern for medication adverse effect.    Kayla Knight has a history of longstanding hypertension previously well controlled on maxzide, but requiring increasing medciations in 2020-2021. Additionally had admission for HTN urgency in 10/2019.  Renal artery duplex 01-15-20 revealed bilateral renal artery stenosis, for which he underwent left renal artery stenting on 01-29-20; angiography showed a 60% right renal artery stenosis so no intervention was completed.  Significant bruising on ASA, hence she stopped taking it, however she continues to be on Plavix.   She denies complaints of chest pain, dyspnea, exertional dyspnea, fatigue, palpitations, orthopnea, PND.    Cardiovascular History:  Moderate mitral regurgitation  Hypertension (20 years)  History of anemia in her 51s    I have independently reviewed of all the diagnostic studies. I have reviewed the old medical records from [facility] prior to this clinic visit.     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 The left ventricle is normal in size with mildly to moderately increased  wall thickness.  ????2. The left ventricular systolic function is normal, LVEF is visually  estimated at > 55%.  ????3. There is moderate mitral valve regurgitation.  ????4. The aortic valve is trileaflet with mildly thickened leaflets with normal  excursion.  ????5. There is mild aortic regurgitation.  ????6. The left atrium is mildly dilated in size.  ????7. The right ventricle is normal in size, with normal systolic function.   Stress test     Cardiac catheterization     [id number] Genotype     Rhythm Monitoring     Cardiac CT/MRI     Electrophysiology      Cardiovascular Surgery     Peripheral Vascular Studies Renal Angiogram 1. Bilateral renal artery stenosis including heavily calcified serial 80% lesions in the left renal artery and 60% right renal artery stenosis at a bifurcation  2. Successful intervention on left renal artery with placement of an Onyx drug eluting stent       I have personally reviewed the recent imaging studies on this patient.    Past medical history:  Patient Active Problem List   Diagnosis   ??? Essential hypertension, benign   ??? Primary osteoarthritis of right knee   ??? Idiopathic peripheral neuropathy   ??? Non-rheumatic tricuspid valve insufficiency   ??? MR (mitral regurgitation)   ??? Mixed stress and urge urinary incontinence   ??? 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 Chest pain   ??? Neck pain   ??? Aortic valve disease   ??? Lipoma of torso   ??? Skin lesion   ??? Hearing loss   ??? Lichen sclerosus et atrophicus of the vulva   ??? 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 Renal artery stenosis, native, bilateral (CMS-HCC)   ??? Hyperlipidemia   ??? Aftercare following joint replacement   ??? Presence of left artificial knee joint   ??? Hypokalemia ??? History of chest pain   ??? Abnormal EKG   ??? Decreased GFR       Past surgical history:  Past Surgical History:   Procedure Laterality Date   ??? CATARACT EXTRACTION W/ INTRAOCULAR LENS IMPLANT Left 02/27/2016   ??? EYE SURGERY  01/30/16    Cataract surgery is scheduled.   ??? JOINT REPLACEMENT  Knee   ??? KNEE CARTILAGE SURGERY  Right 2007, 2008    x 2   ??? KNEE SURGERY      pt reports total left knee joint replacement surgery around 2016, 2017   ??? PLANTAR FASCIECTOMY Left    ??? PR REVASCULARIZE FEM/POP ARTERY,ANGIOPLASTY/STENT N/A 01/29/2020    Procedure: Peripheral Angiography W Intervetion;  Surgeon: Alvira Philips, MD;  Location: [facility] CATH;  Service: Cardiology   ??? PR XCAPSL CTRC RMVL INSJ IO LENS PROSTH W/O ECP Right 02/13/2016    Procedure: EXTRACAPSULAR CATARACT REMOVAL W/INSERTION OF INTRAOCULAR LENS PROSTHESIS, MANUAL OR MECHANICAL TECHNIQUE;  Surgeon: Garey Ham, MD;  Location[facility] OR [facility];  Service: Ophthalmology   ??? PR XCAPSL CTRC RMVL INSJ IO LENS PROSTH W/O ECP Left 02/27/2016    Procedure: EXTRACAPSULAR CATARACT REMOVAL W/INSERTION OF INTRAOCULAR LENS PROSTHESIS, MANUAL OR MECHANICAL TECHNIQUE;  Surgeon: Garey Ham, MD;  Location: [facility] OR [facility];  Service: Ophthalmology   ??? RENAL ARTERY STENT  01/11/2020   ??? ROOT CANAL     ??? TUBAL LIGATION  1973    [facility]   ??? WISDOM TOOTH EXTRACTION         Medications:   Patient's Medications   New Prescriptions    No medications on file   Previous Medications    AMLODIPINE (NORVASC) 5 MG TABLET    Take 1 tablet (5 mg total) by mouth daily.    CLOPIDOGREL (PLAVIX) 75 MG TABLET    Take 1 tablet (75 mg total) by mouth daily.    DICLOFENAC SODIUM (VOLTAREN) 1 % GEL    Apply 2 g topically.    EMPTY CONTAINER (SHARPS-A-GATOR DISPOSAL SYSTEM) MISC    Use as directed for sharps disposal    EVOLOCUMAB (REPATHA SURECLICK) 140 MG/ML PNIJ    Inject the contents of 1 pen (140 mg) under the skin every fourteen (14) days.    FAMOTIDINE (PEPCID) 20 MG TABLET    Take 1 tablet (20 mg total) by mouth Two (2) times a day.    LOSARTAN-HYDROCHLOROTHIAZIDE (HYZAAR) 100-25 MG PER TABLET    Take 1 tablet by mouth daily.    ONDANSETRON (ZOFRAN-ODT) 4 MG DISINTEGRATING TABLET    Take 1 tablet (4 mg total) by mouth every eight (8) hours as needed for nausea for up to 3 days.   Modified Medications    No medications on file   Discontinued Medications    No medications on file       Allergies:  Allergies   Allergen Reactions   ??? Penicillins Itching and Swelling   ??? Lisinopril Cough   ??? Statins-Hmg-Coa Reductase Inhibitors      Ineffective at controlling cholesterol when tried in the past.   ??? Sulfa (Sulfonamide Antibiotics) Itching       Social History:  She  reports that she quit smoking about 36 years ago. Her smoking use included cigarettes. She smoked 0.00 packs per day for 0.00 years. She has never used smokeless tobacco. She reports previous alcohol use. She reports that she does not use drugs.    Family History:  Her family history includes Alcohol abuse in her father; Arthritis in her father, mother, sister, and sister; Asthma in her daughter; COPD in her sister; Cancer in her maternal aunt; Dementia in her mother; Depression in her sister; Diabetes in her daughter, paternal aunt, paternal aunt, and sister; Early death in her maternal aunt; Glaucoma in her father, sister, and sister; Heart attack (age of onset: 54) in her father; Heart disease in her  father and mother; Hypertension in her daughter, father, mother, sister, and sister; Kidney disease in her paternal aunt; Miscarriages / Stillbirths in her daughter; No Known Problems in her brother, maternal aunt, maternal grandfather, maternal grandmother, maternal uncle, paternal grandfather, paternal grandmother, and paternal uncle; Thyroid disease in her father.    Review of Systems  10 systems were reviewed and negative except as noted in HPI.      OBJECTIVE:       Physical Exam  BP 149/62  - Pulse 85  - Wt 74.8 kg (164 lb 12.8 oz)  - SpO2 97%  - BMI 25.81 kg/m??    Wt Readings from Last 3 Encounters:   01/06/21 74.8 kg (164 lb 12.8 oz)   12/10/20 74.3 kg (163 lb 14.4 oz)   11/13/20 78 kg (172 lb)       General:  Alert, no distress.   Eyes:  Intact, sclerae anicteric.   ENT: Moist mucous membranes. Supple. No obvious thyromegaly.   Respiratory:   CTAB bilaterally with normal WOB.   Cardiovascular:  No carotid bruit, JVD normal at 90 degrees, 4/6 decrescendo murmur heard throughout the precordium, RRR, no rubs or gallops  No edema bilaterally. Pulses full and equal throughout   Gastrointestinal:   nondistented   Musculoskeletal: Normal bulk   Skin: Warm, well perfused.   Neurologic: No focal deficits.       Most recent labs   Lab Results   Component Value Date    Sodium 142 09/18/2020    Sodium 137 04/24/2019    Potassium 4.0 09/18/2020    Potassium 3.7 04/24/2019    Chloride 104 09/18/2020    Chloride 103 04/24/2019    CO2 29.2 09/18/2020    CO2 30.3 04/24/2019    BUN 16 09/18/2020    BUN 12.00 04/24/2019    Creatinine 1.03 09/18/2020    Creatinine 0.8 04/24/2019    Magnesium 2.0 11/13/2019     Lab Results   Component Value Date    HGB 12.8 09/18/2020    HGB 12.7 10/20/2018    MCV 76.0 (L) 09/18/2020    MCV 77 (L) 10/20/2018    Platelet 374 09/18/2020    Platelet 343 10/20/2018     Lab Results   Component Value Date    Cholesterol 263 (H) 09/18/2020    Cholesterol, Total 248 (H) 10/20/2018    Triglycerides 100 09/18/2020    Triglycerides 121 10/20/2018    HDL 53 09/18/2020    HDL 49 10/20/2018    Non-HDL Cholesterol 210 (H) 09/18/2020    LDL calculated 174.8 (H) 10/20/2018    LDL Calculated 190 (H) 09/18/2020    Hemoglobin A1C 6.4 (H) 09/18/2020    Hemoglobin A1C 6.0 04/24/2019    TSH 1.050 09/25/2019    TSH 0.88 10/06/2016    PRO-BNP 88.2 02/17/2020    INR 0.91 08/13/2018            Erma Joubert Carrolyn Meiers, MD MPH  [facility] Cardiology, PGY-4

## 2021-01-07 NOTE — Unmapped (Signed)
It was a pleasure seeing you in clinic today. You will find plans for testing or medications changes in this summary. Results of testing can be viewed via MyChart. Some test results may be discussed with you over the phone. If you do not have MyChart, you can sign up at [email or web address] Please contact us via MyChart or by phone with any questions or concerns.     CONTACT:  Clinic Phone: [phone]  Clinic Fax: [phone]    Clinic Visit Summary:  You were seen in clinic by Dr. Paulino Rily and Dr. Laurice Record.  Continue to monitor how your tongue soreness improves over the next few days. If things worsen, please discuss with your primary care doctor. This is very unlikely to be a side effect of Repatha or any of the heart medications you are on.  We will obtain blood pressure monitoring to determine how high your blood pressure has been so we can adjust your blood pressure medications as able.

## 2021-01-16 DIAGNOSIS — I160 Hypertensive urgency: Principal | ICD-10-CM

## 2021-01-16 MED ORDER — LOSARTAN 100 MG-HYDROCHLOROTHIAZIDE 25 MG TABLET
ORAL_TABLET | Freq: Every day | ORAL | 3 refills | 90.00000 days | Status: CP
Start: 2021-01-16 — End: 2022-01-16

## 2021-01-16 NOTE — Unmapped (Signed)
Refill request received for patient.      Medication Requested: Hyzaar 100-25mg   Last Office Visit: 01/06/2021   Next Office Visit: 02/10/2021  Last Prescriber: Paulino Rily    Nurse refill requirements met? Yes  If not met, why:     Sent to: Pharmacy per protocol  If sent to provider, which provider?:

## 2021-02-04 ENCOUNTER — Encounter: Admit: 2021-02-04 | Discharge: 2021-02-04 | Payer: MEDICARE

## 2021-02-10 ENCOUNTER — Institutional Professional Consult (permissible substitution): Admit: 2021-02-10 | Discharge: 2021-02-11 | Payer: MEDICARE

## 2021-02-10 DIAGNOSIS — I10 Essential (primary) hypertension: Principal | ICD-10-CM

## 2021-02-10 NOTE — Unmapped (Signed)
24 hr blood pressure monitor placement on 02/10/21 nurse visit. Patient understood instructions and stated to return monitor the next day.

## 2021-02-10 NOTE — Unmapped (Signed)
Patient Instruction Sheet For  Ambulatory Blood Pressure Monitoring    Your physician has requested that you undergo monitoring of your blood pressure for 24 hours. Managing your blood pressure properly is important to keep you healthy for the rest of your life. To get the best results, please read and follow the instructions below.    ??? The blood pressure (BP) monitor is completely automatic. You do not need to press any buttons.  ??? The screen does not show any numbers while you are wearing the monitor.  ??? Once the monitor has been placed on your arm, please do not remove it.  ??? As the monitor begins to take your BP, try to relax your arm and be still for the duration of the measurement. Let the cuffed arm hang loosely, slightly away from the body. Avoid flexing the muscles or moving the hand and fingers of the cuffed arm.  ??? If the cuff causes extreme pain or pain not normally associated with blood pressure measurement, remove the cuff and turn off the monitor. You can stop a measurement in progress by pressing the Start/Stop button and the cuff will automatically deflate.  ??? Do not remove the monitor to sleep. Leave the cuff on and simply place the monitor on the bed or nightstand beside you. Make sure that the hose is not kinked while sleeping.  ??? Do not get the unit wet. Do not remove the monitor to take a shower or bath. Please sponge-bathe only.  ??? During the daytime, try to refrain from sleeping, performing vigorous exercise or strenuous work, and spending long periods of time driving. Going for walks is fine.  ??? Complete the patient diary to track your medications and activities. After the cuff has deflated, mark your diary.    Keep a diary of your readings and any symptoms you experience.              You MUST return the ambulatory blood pressure monitor on _________________5/17/22____ by 4:30pm to [facility] . You have signed a paper stating that if the monitor is lost or damaged, then you will be charged for the full amount to cover the expense for a replacement.    If you have any questions, please contact the [facility] at [phone] between the hours of 8am to 4:30pm. The clinic is located at [address] in [place], [place].

## 2021-02-12 MED ORDER — TRETINOIN 0.025 % TOPICAL CREAM
0 days
Start: 2021-02-12 — End: ?

## 2021-02-14 NOTE — Unmapped (Signed)
===View-only below this line===  ----- Message -----  From: Rudene Anda, MD  Sent: 02/12/2021   1:56 PM EDT  To: Jackey Loge, RN  Subject: RE: plavix orders needed                         This Q was answered by Dr. Jaymes Graff last week.  I don't understand.  But yes, she can hold her Plavix for 5 days prior to her procedure.  No need for ASA.  This was the conversation exchange from then:        ----- Message -----   From: Cherie Dark, RN   Sent: 02/04/2021 ??11:03 AM EDT   To: Virgina Evener, MD, *   Subject: RE: Plavix/ASA guidelines ?? ?? ?? ?? ?? ?? ?? ?? ?? ??     Thank you Dr. Jaymes Graff.   She will be asked to stop the plavix prior to EGD so if she needs to start back on the ASA, could one of you please let her know?     ----- Message -----   From: Alvira Philips, MD   Sent: 02/04/2021 ??10:04 AM EDT   To: Virgina Evener, MD, *   Subject: RE: Plavix/ASA guidelines ?? ?? ?? ?? ?? ?? ?? ?? ?? ??     Thank you for letting us know. The good news is that it is ok that she is on a single antiplatelet agent since it is a year since her stent. We have seen other patients who are confused about aspirin given the coverage in the popular press. ??     ----- Message -----   From: Cherie Dark, RN   Sent: 02/04/2021 ?? 9:31 AM EDT   To: Virgina Evener, MD, *   Subject: Plavix/ASA guidelines ?? ?? ?? ?? ?? ?? ?? ?? ?? ?? ?? ??     Hi,   Pt here today for upper endoscopy and had to be rescheduled. ??It was revealed during her intake assessment that she has stopped taking her ASA because they said that on the news, is still taking the Plavix. ??Please advise pt as to what she should be on regarding renal artery stent placed on 01/29/20.     She will need to hold plavix prior to upper endoscopy and we have sent note to GI schedulers so they can coordinate with pt for rescheduling procedure.     Thanks,   Santina Evans in [place] GI proc's.            ----- Message -----  From: Jackey Loge, RN  Sent: 02/12/2021  12:56 PM EDT  To: Rudene Anda, MD  Subject: plavix orders needed                             Dear Dr. Laurice Record,    This patient is awaiting scheduling for an EGD and is on Plavix. Could you please advise regarding pre-procedure Plavix? Thank you!    ??  Patient: Kayla Knight    DOB: 18-Nov-1940    MRN: [id number]    Procedure: EGD    Antiplatelet: Plavix    Per ASGE Guidelines, GI Procedures recommend patients hold their Plavix for five (5) days prior to procedure. Please indicate if this recommendation is acceptable for the patient.    Please advise if patient should continue their antiplatelet or if you disagree  with these recommendations.    Thank you!    Sincerely,     Kathe Mariner, RN  [facility] GI Procedures Triage Nurse   P: [phone]   F: [phone]

## 2021-02-18 MED ORDER — CLOPIDOGREL 75 MG TABLET
ORAL_TABLET | Freq: Every day | ORAL | 11 refills | 30 days | Status: CP
Start: 2021-02-18 — End: ?

## 2021-02-18 NOTE — Unmapped (Signed)
Refill request received for patient.      Medication Requested: Plavix   Last Office Visit: 01/06/2021   Next Office Visit: 04/14/2021  Last Prescriber: Trilby Drummer, NP    Nurse refill requirements met? Yes  If not met, why:      Sent to: Provider for signing  If sent to provider, which provider?: Dr. Paulino Rily

## 2021-02-18 NOTE — Unmapped (Signed)
Kayla Knight is your long-time patient who I just saw once due to concerns about her tongue.    I am forwarding this to you since she will be following with you.

## 2021-02-20 MED ORDER — CLOPIDOGREL 75 MG TABLET
ORAL_TABLET | Freq: Every day | ORAL | 11 refills | 30 days
Start: 2021-02-20 — End: ?

## 2021-02-26 MED ORDER — CLOPIDOGREL 75 MG TABLET
ORAL_TABLET | Freq: Every day | ORAL | 11 refills | 30.00000 days
Start: 2021-02-26 — End: ?

## 2021-02-26 NOTE — Unmapped (Signed)
Refill done 01/2021.

## 2021-02-28 NOTE — Unmapped (Signed)
Error

## 2021-03-25 ENCOUNTER — Ambulatory Visit: Admit: 2021-03-25 | Payer: MEDICARE | Attending: Internal Medicine | Primary: Internal Medicine

## 2021-03-25 NOTE — Unmapped (Unsigned)
Assessment and Plan      No diagnosis found.      ***    No problem-specific Assessment & Plan notes found for this encounter.      No orders of the defined types were placed in this encounter.      Requested Prescriptions      No prescriptions requested or ordered in this encounter       There are no discontinued medications.    No follow-ups on file.    Signs and symptoms that should prompt return sooner than scheduled were reviewed with the patient.      PCMH Components:     Medication adherence and barriers to the treatment plan have been addressed. Opportunities to optimize healthy behaviors have been discussed. Patient / caregiver voiced understanding.        HPI      Chief Complaint    No chief complaint on file.      Kayla Knight presents for {LPT PC ZOXW:96045} of the following concern(s):    ***    Health Maintenance reviewed - {LPT Health Maintenance:60470}      I have reviewed the patient's medical history in detail and updated the computerized patient record.              Patient Active Problem List   Diagnosis   ??? Essential hypertension, benign   ??? Primary osteoarthritis of right knee   ??? Idiopathic peripheral neuropathy   ??? Non-rheumatic tricuspid valve insufficiency   ??? MR (mitral regurgitation)   ??? Mixed stress and urge urinary incontinence   ??? 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 Chest pain   ??? Neck pain   ??? Aortic valve disease   ??? Lipoma of torso   ??? Skin lesion   ??? Hearing loss   ??? Lichen sclerosus et atrophicus of the vulva   ??? 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 Renal artery stenosis, native, bilateral (CMS-HCC)   ??? Hyperlipidemia   ??? Aftercare following joint replacement   ??? Presence of left artificial knee joint   ??? Hypokalemia   ??? History of chest pain   ??? Abnormal EKG   ??? Decreased GFR         Allergies:  Penicillins, Lisinopril, Statins-hmg-coa reductase inhibitors, and Sulfa (sulfonamide antibiotics)    Medications: Outpatient Medications Prior to Visit   Medication Sig Dispense Refill   ??? amLODIPine (NORVASC) 5 MG tablet Take 1 tablet (5 mg total) by mouth daily. 90 tablet 3   ??? clopidogreL (PLAVIX) 75 mg tablet Take 1 tablet (75 mg total) by mouth in the morning. 30 tablet 11   ??? diclofenac sodium (VOLTAREN) 1 % gel Apply 2 g topically.     ??? empty container (SHARPS-A-GATOR DISPOSAL SYSTEM) Misc Use as directed for sharps disposal 1 each 2   ??? evolocumab (REPATHA SURECLICK) 140 mg/mL PnIj Inject the contents of 1 pen (140 mg) under the skin every fourteen (14) days. 6 mL 3   ??? famotidine (PEPCID) 20 MG tablet Take 1 tablet (20 mg total) by mouth Two (2) times a day. 60 tablet 4   ??? losartan-hydrochlorothiazide (HYZAAR) 100-25 mg per tablet TAKE 1 TABLET BY MOUTH DAILY. 90 tablet 3     No facility-administered medications prior to visit.       Medical History:  Past Medical History:   Diagnosis Date   ??? Anemia     past history,  over 40 yrs ago   ??? Arthritis     hand, knees, joints   ??? Asthma     past history, no recent attacks   ??? Asthma     patient stated due to asthmatic medication   ??? Cataract 2008    Surgery   ??? Diabetes mellitus (CMS-HCC)     Type II, on 9/15 patient stated she was not longer diabetic, not taking meds   ??? Dry eye    ??? Hearing impairment    ??? Heart murmur 2016    Dr. Verdie Shire @ [facility]   ??? Heart valve disease     Heart mumur, pt reports diagnosis 10 yrs ago (05/10/20)   ??? History of stent insertion of renal artery 01/29/2020    ASA indefinitely and plavix x 6 months per procedure note   ??? Hypertension     pt reports taking meds since about 1995   ??? Neuromuscular disorder (CMS-HCC) 2014    Feet & Legs   ??? Tear of meniscus of knee     4.5 yrs ago   ??? Urinary incontinence     seen a doctor about nighttime bathroom use (adressed in 2019)       Surgical History:  Past Surgical History:   Procedure Laterality Date   ??? CATARACT EXTRACTION W/ INTRAOCULAR LENS IMPLANT Left 02/27/2016 ??? EYE SURGERY  01/30/16    Cataract surgery is scheduled.   ??? JOINT REPLACEMENT  Knee   ??? KNEE CARTILAGE SURGERY Right 2007, 2008    x 2   ??? KNEE SURGERY      pt reports total left knee joint replacement surgery around 2016, 2017   ??? PLANTAR FASCIECTOMY Left    ??? PR REVASCULARIZE FEM/POP ARTERY,ANGIOPLASTY/STENT N/A 01/29/2020    Procedure: Peripheral Angiography W Intervetion;  Surgeon: Alvira Philips, MD;  Location: [facility] CATH;  Service: Cardiology   ??? PR XCAPSL CTRC RMVL INSJ IO LENS PROSTH W/O ECP Right 02/13/2016    Procedure: EXTRACAPSULAR CATARACT REMOVAL W/INSERTION OF INTRAOCULAR LENS PROSTHESIS, MANUAL OR MECHANICAL TECHNIQUE;  Surgeon: Garey Ham, MD;  [facility];  Service: Ophthalmology   ??? PR XCAPSL CTRC RMVL INSJ IO LENS PROSTH W/O ECP Left 02/27/2016    Procedure: EXTRACAPSULAR CATARACT REMOVAL W/INSERTION OF INTRAOCULAR LENS PROSTHESIS, MANUAL OR MECHANICAL TECHNIQUE;  Surgeon: Garey Ham, MD;  [facility];  Service: Ophthalmology   ??? RENAL ARTERY STENT  01/11/2020   ??? ROOT CANAL     ??? TUBAL LIGATION  1973    [facility]   ??? WISDOM TOOTH EXTRACTION         Social History:  Tobacco use:   reports that she quit smoking about 36 years ago. Her smoking use included cigarettes. She smoked 0.00 packs per day for 0.00 years. She has never used smokeless tobacco.  Alcohol use:   reports previous alcohol use.  Drug use:  reports no history of drug use.      Family History:  Family History   Problem Relation Age of Onset   ??? Heart disease Mother    ??? Hypertension Mother    ??? Dementia Mother    ??? Arthritis Mother    ??? Heart disease Father    ??? Thyroid disease Father    ??? Glaucoma Father    ??? Alcohol abuse Father    ??? Heart attack Father 52   ??? Arthritis Father    ??? Hypertension Father  Massive heart attack   ??? Arthritis Sister    ??? Glaucoma Sister    ??? Hypertension Sister    ??? Diabetes Sister    ??? Diabetes Daughter    ??? Diabetes Paternal Aunt    ??? No Known Problems Brother    ??? No Known Problems Maternal Aunt    ??? No Known Problems Maternal Uncle    ??? No Known Problems Paternal Uncle    ??? No Known Problems Maternal Grandmother    ??? No Known Problems Maternal Grandfather    ??? No Known Problems Paternal Grandmother    ??? No Known Problems Paternal Grandfather    ??? Arthritis Sister         Noticeable in extremities   ??? COPD Sister    ??? Depression Sister    ??? Hypertension Sister    ??? Glaucoma Sister    ??? Asthma Daughter         Florencia Reasons   ??? Hypertension Daughter    ??? Miscarriages / [place] Daughter    ??? Cancer Maternal Aunt         Died more than 32yrs ago   ??? Early death Maternal Aunt    ??? Diabetes Paternal Aunt         4 Aunts   ??? Kidney disease Paternal Aunt    ??? Amblyopia Neg Hx    ??? Blindness Neg Hx    ??? Cancer Neg Hx    ??? Cataracts Neg Hx    ??? Macular degeneration Neg Hx    ??? Retinal detachment Neg Hx    ??? Strabismus Neg Hx    ??? Stroke Neg Hx            Review of Systems:    Pertinent review of systems is noted in the HPI. {LPT GNF:62130}          Physical Exam      There were no vitals taken for this visit.      BP Readings from Last 3 Encounters:   02/10/21 164/73   01/06/21 149/62   12/10/20 158/80       Wt Readings from Last 3 Encounters:   01/06/21 74.8 kg (164 lb 12.8 oz)   12/10/20 74.3 kg (163 lb 14.4 oz)   11/13/20 78 kg (172 lb)         General: alert, {lpt is/is not - is:60543::is} oriented,  {lpt is/is not - is not:60531::is not} in distress.  HEENT: {normal/abnormal/---:60535::normal} sclerae. Mouth and throat are {normal/abnormal/---:60535::normal}  NECK: Lymphadenopathy is {present/not present LC:82124::not present} .  Enlarged thyroid is {present/not present LC:82124::not present}.  RESP: {normal/abnormal/---:60535::normal} respiratory effort. Breath sounds are {normal/abnormal/---:60535::normal}.    CV: Rate is {lpt normal/increased/decreased:60561::normal} and rhythm {lpt regular/irregular:60562::regular}.  Normal S1 and S2. Murmur(s) is {Absent or Present only:23125::absent}.  ABD:  Abdomen is soft, {lpt tender/non tender:60569::non tender} with {lpt normal/increased/decreased:60561::normal} bowel sounds,  {lpt yes/no:60570::no} hepatosplenomegaly, masses, rebound, guarding, or hernias.   MSK:  Muscle tenderness is {LPT present/absent:60538::absent}. Joint swelling is {present not present:29926::not present}  SKIN: Skin {lpt is/is not - is:60543::is} normal in turgor and texture.  Rash is {LPT present/absent:60538::absent}.   NEURO: Gait is {stable/unstable:38941::stable} and coordination {normal/abnormal/---:60535::normal}.  EXT:  Edema is {LPT present/absent:60538::absent}.  Capillary refill is {normal/abnormal/---:60535::normal}.    ***

## 2021-04-01 ENCOUNTER — Encounter: Admit: 2021-04-01 | Discharge: 2021-04-01 | Payer: MEDICARE | Attending: Anesthesiology | Primary: Anesthesiology

## 2021-04-01 ENCOUNTER — Ambulatory Visit: Admit: 2021-04-01 | Discharge: 2021-04-01 | Payer: MEDICARE

## 2021-04-01 MED ORDER — PANTOPRAZOLE 40 MG TABLET,DELAYED RELEASE
ORAL_TABLET | Freq: Every day | ORAL | 5 refills | 30 days | Status: CP
Start: 2021-04-01 — End: 2021-05-01

## 2021-04-01 MED ADMIN — propofoL (DIPRIVAN) injection: INTRAVENOUS | @ 15:00:00 | Stop: 2021-04-01

## 2021-04-01 MED ADMIN — sodium chloride (NS) 0.9 % infusion: 10 mL/h | INTRAVENOUS | @ 14:00:00 | Stop: 2021-04-01

## 2021-04-02 ENCOUNTER — Ambulatory Visit: Admit: 2021-04-02 | Discharge: 2021-04-03 | Payer: MEDICARE | Attending: Internal Medicine | Primary: Internal Medicine

## 2021-04-02 DIAGNOSIS — E785 Hyperlipidemia, unspecified: Principal | ICD-10-CM

## 2021-04-02 DIAGNOSIS — R718 Other abnormality of red blood cells: Principal | ICD-10-CM

## 2021-04-02 DIAGNOSIS — E1169 Type 2 diabetes mellitus with other specified complication: Principal | ICD-10-CM

## 2021-04-02 DIAGNOSIS — E782 Mixed hyperlipidemia: Principal | ICD-10-CM

## 2021-04-02 DIAGNOSIS — R413 Other amnesia: Principal | ICD-10-CM

## 2021-04-02 LAB — CBC W/ AUTO DIFF
BASOPHILS ABSOLUTE COUNT: 0.1 10*9/L (ref 0.0–0.1)
BASOPHILS RELATIVE PERCENT: 0.9 %
EOSINOPHILS ABSOLUTE COUNT: 0.2 10*9/L (ref 0.0–0.5)
EOSINOPHILS RELATIVE PERCENT: 3.1 %
HEMATOCRIT: 37.7 % (ref 34.0–44.0)
HEMOGLOBIN: 12.3 g/dL (ref 11.3–14.9)
LYMPHOCYTES ABSOLUTE COUNT: 2.1 10*9/L (ref 1.1–3.6)
LYMPHOCYTES RELATIVE PERCENT: 32.2 %
MEAN CORPUSCULAR HEMOGLOBIN CONC: 32.7 g/dL (ref 32.0–36.0)
MEAN CORPUSCULAR HEMOGLOBIN: 24.1 pg — ABNORMAL LOW (ref 25.9–32.4)
MEAN CORPUSCULAR VOLUME: 73.7 fL — ABNORMAL LOW (ref 77.6–95.7)
MEAN PLATELET VOLUME: 7.7 fL (ref 6.8–10.7)
MONOCYTES ABSOLUTE COUNT: 0.6 10*9/L (ref 0.3–0.8)
MONOCYTES RELATIVE PERCENT: 9.1 %
NEUTROPHILS ABSOLUTE COUNT: 3.6 10*9/L (ref 1.8–7.8)
NEUTROPHILS RELATIVE PERCENT: 54.7 %
PLATELET COUNT: 344 10*9/L (ref 150–450)
RED BLOOD CELL COUNT: 5.11 10*12/L (ref 3.95–5.13)
RED CELL DISTRIBUTION WIDTH: 17 % — ABNORMAL HIGH (ref 12.2–15.2)
WBC ADJUSTED: 6.7 10*9/L (ref 3.6–11.2)

## 2021-04-02 LAB — COMPREHENSIVE METABOLIC PANEL
ALBUMIN: 3.9 g/dL (ref 3.4–5.0)
ALKALINE PHOSPHATASE: 68 U/L (ref 46–116)
ALT (SGPT): 14 U/L (ref 10–49)
ANION GAP: 7 mmol/L (ref 5–14)
AST (SGOT): 23 U/L (ref <=34)
BILIRUBIN TOTAL: 0.4 mg/dL (ref 0.3–1.2)
BLOOD UREA NITROGEN: 13 mg/dL (ref 9–23)
BUN / CREAT RATIO: 14
CALCIUM: 9.6 mg/dL (ref 8.7–10.4)
CHLORIDE: 107 mmol/L (ref 98–107)
CO2: 26 mmol/L (ref 20.0–31.0)
CREATININE: 0.94 mg/dL — ABNORMAL HIGH
EGFR CKD-EPI (2021) FEMALE: 62 mL/min/{1.73_m2} (ref >=60)
GLUCOSE RANDOM: 87 mg/dL (ref 70–179)
POTASSIUM: 3.7 mmol/L (ref 3.4–4.8)
PROTEIN TOTAL: 7.4 g/dL (ref 5.7–8.2)
SODIUM: 140 mmol/L (ref 135–145)

## 2021-04-02 LAB — LIPID PANEL
CHOLESTEROL/HDL RATIO SCREEN: 3 (ref 0.0–4.5)
CHOLESTEROL: 158 mg/dL (ref 0–200)
HDL CHOLESTEROL: 53 mg/dL (ref 40–60)
LDL CHOLESTEROL CALCULATED: 67 mg/dL (ref 30–130)
NON-HDL CHOLESTEROL: 105 mg/dL
TRIGLYCERIDES: 188 mg/dL — ABNORMAL HIGH (ref <=150)
VLDL CHOLESTEROL CAL: 37.6 mg/dL (ref 25–40)

## 2021-04-02 LAB — SLIDE REVIEW

## 2021-04-02 LAB — FERRITIN: FERRITIN: 18.2 ng/mL

## 2021-04-02 LAB — TSH: THYROID STIMULATING HORMONE: 1.04 u[IU]/mL (ref 0.010–4.000)

## 2021-04-02 LAB — VITAMIN B12: VITAMIN B-12: 588 pg/mL (ref 211–911)

## 2021-04-02 MED ORDER — REPATHA SURECLICK 140 MG/ML SUBCUTANEOUS PEN INJECTOR
SUBCUTANEOUS | 3 refills | 84 days | Status: CP
Start: 2021-04-02 — End: ?
  Filled 2021-04-09: qty 6, 84d supply, fill #0

## 2021-04-02 NOTE — Unmapped (Signed)
Assessment and Plan      1. 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 Mixed hyperlipidemia    3. Memory loss    4. Microcytosis        HM in diabetes mellitus was reviewed and is current including COVID-19 vaccines.  Labs per orders. Follow up as scheduled with appropriate provider or sooner prn.    Repatha was refilled.  Follow up as scheduled with appropriate provider or sooner prn.    Check for reversible causes of memory loss.  Follow up pending results.      No problem-specific Assessment & Plan notes found for this encounter.      Orders Placed This Encounter   Procedures   ??? HM DIABETES FOOT EXAM   ??? HM DIABETES EYE EXAM   ??? Hemoglobin A1c   ??? Vitamin B12   ??? TSH   ??? Comprehensive metabolic panel   ??? CBC w/ Differential   ??? Lipid panel (non-fasting)       Requested Prescriptions     Signed Prescriptions Disp Refills   ??? evolocumab (REPATHA SURECLICK) 140 mg/mL PnIj 6 mL 3     Sig: Inject the contents of 1 pen (140 mg) under the skin every fourteen (14) days.       Medications Discontinued During This Encounter   Medication Reason   ??? evolocumab (REPATHA SURECLICK) 140 mg/mL PnIj Reorder       Return in about 6 months (around 10/03/2021).    Signs and symptoms that should prompt return sooner than scheduled were reviewed with the patient.      PCMH Components:     Medication adherence and barriers to the treatment plan have been addressed. Opportunities to optimize healthy behaviors have been discussed. Patient / caregiver voiced understanding.        HPI      Chief Complaint    Chief Complaint   Patient presents with   ??? Follow-up       Kayla Knight presents for follow up of the following concern(s):    She had upper endoscopy yesterday.      Memory is not as good.  She notes more difficulty remembering names and with word-finding.  .  She is having no problems with activities of daily living (finances, transportation, housekeeping, shopping).     Vulvar growth resolved before she had dermatology appointment.     No new issues are noted with regard to hypertension.  (S)He denies adverse effects from medication.  She attributes blood pressure improvement  to taking groats mixed with lemon juice and olive oil every morning.  A 24 hr ABPM was recently ordered.     No new issues are noted with regard to diabetes mellitus. She is followed by podiatry.  She has new sneakers which have lessened foot pain.     She is back to walking a mile a day.  She is not yet back to water aerobics.       Health Maintenance reviewed - per orders      I have reviewed the patient's medical history in detail and updated the computerized patient record.          Patient Active Problem List   Diagnosis   ??? Essential hypertension, benign   ??? Primary osteoarthritis of right knee   ??? Idiopathic peripheral neuropathy   ??? Non-rheumatic tricuspid valve insufficiency   ??? MR (mitral regurgitation)   ??? Mixed stress and urge urinary incontinence   ???  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 Chest pain   ??? Neck pain   ??? Aortic valve disease   ??? Lipoma of torso   ??? Skin lesion   ??? Hearing loss   ??? Lichen sclerosus et atrophicus of the vulva   ??? 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 Renal artery stenosis, native, bilateral (CMS-HCC)   ??? Hyperlipidemia   ??? Aftercare following joint replacement   ??? Presence of left artificial knee joint   ??? Hypokalemia   ??? History of chest pain   ??? Abnormal EKG   ??? Decreased GFR         Allergies:  Penicillins, Lisinopril, Statins-hmg-coa reductase inhibitors, and Sulfa (sulfonamide antibiotics)    Medications:   Outpatient Medications Prior to Visit   Medication Sig Dispense Refill   ??? amLODIPine (NORVASC) 5 MG tablet Take 1 tablet (5 mg total) by mouth daily. 90 tablet 3   ??? clopidogreL (PLAVIX) 75 mg tablet Take 1 tablet (75 mg total) by mouth in the morning. 30 tablet 11   ??? diclofenac sodium (VOLTAREN) 1 % gel Apply 2 g topically.     ??? famotidine (PEPCID) 20 MG tablet Take 1 tablet (20 mg total) by mouth Two (2) times a day. 60 tablet 4   ??? losartan-hydrochlorothiazide (HYZAAR) 100-25 mg per tablet TAKE 1 TABLET BY MOUTH DAILY. 90 tablet 3   ??? pantoprazole (PROTONIX) 40 MG tablet Take 1 tablet (40 mg total) by mouth in the morning. 30 tablet 5   ??? evolocumab (REPATHA SURECLICK) 140 mg/mL PnIj Inject the contents of 1 pen (140 mg) under the skin every fourteen (14) days. 6 mL 3   ??? empty container (SHARPS-A-GATOR DISPOSAL SYSTEM) Misc Use as directed for sharps disposal 1 each 2   ??? tretinoin (RETIN-A) 0.025 % cream        No facility-administered medications prior to visit.       Medical History:  Past Medical History:   Diagnosis Date   ??? Anemia     past history, over 40 yrs ago   ??? Arthritis     hand, knees, joints   ??? Asthma     past history, no recent attacks   ??? Asthma     patient stated due to asthmatic medication   ??? Cataract 2008    Surgery   ??? Diabetes mellitus (CMS-HCC)     Type II, on 9/15 patient stated she was not longer diabetic, not taking meds   ??? Dry eye    ??? Hearing impairment    ??? Heart murmur 2016    Dr. Verdie Shire @ [facility]   ??? Heart valve disease     Heart mumur, pt reports diagnosis 10 yrs ago (05/10/20)   ??? History of stent insertion of renal artery 01/29/2020    ASA indefinitely and plavix x 6 months per procedure note   ??? Hypertension     pt reports taking meds since about 1995   ??? Neuromuscular disorder (CMS-HCC) 2014    Feet & Legs   ??? Tear of meniscus of knee     4.5 yrs ago   ??? Urinary incontinence     seen a doctor about nighttime bathroom use (adressed in 2019)       Surgical History:  Past Surgical History:   Procedure Laterality Date   ??? CATARACT EXTRACTION W/ INTRAOCULAR LENS IMPLANT Left 02/27/2016   ??? EYE  SURGERY  01/30/16    Cataract surgery is scheduled.   ??? JOINT REPLACEMENT  Knee   ??? KNEE CARTILAGE SURGERY Right 2007, 2008    x 2   ??? KNEE SURGERY      pt reports total left knee joint replacement surgery around 2016, 2017   ??? PLANTAR FASCIECTOMY Left    ??? PR REVASCULARIZE FEM/POP ARTERY,ANGIOPLASTY/STENT N/A 01/29/2020    Procedure: Peripheral Angiography W Intervetion;  Surgeon: Alvira Philips, MD;  Location: [facility] CATH;  Service: Cardiology   ??? PR UPPER GI ENDOSCOPY,BIOPSY N/A 04/01/2021    Procedure: UGI ENDOSCOPY; WITH BIOPSY, SINGLE OR MULTIPLE;  Surgeon: Jarvis Morgan, MD;  Location: [facility];  Service: Gastroenterology   ??? PR XCAPSL CTRC RMVL INSJ IO LENS PROSTH W/O ECP Right 02/13/2016    Procedure: EXTRACAPSULAR CATARACT REMOVAL W/INSERTION OF INTRAOCULAR LENS PROSTHESIS, MANUAL OR MECHANICAL TECHNIQUE;  Surgeon: Garey Ham, MD;  [facility] OR [facility];  Service: Ophthalmology   ??? PR XCAPSL CTRC RMVL INSJ IO LENS PROSTH W/O ECP Left 02/27/2016    Procedure: EXTRACAPSULAR CATARACT REMOVAL W/INSERTION OF INTRAOCULAR LENS PROSTHESIS, MANUAL OR MECHANICAL TECHNIQUE;  Surgeon: Garey Ham, MD;  [facility];  Service: Ophthalmology   ??? RENAL ARTERY STENT  01/11/2020   ??? ROOT CANAL     ??? TUBAL LIGATION  1973    [facility]   ??? WISDOM TOOTH EXTRACTION         Social History:  Tobacco use:   reports that she quit smoking about 36 years ago. Her smoking use included cigarettes. She smoked 0.00 packs per day for 0.00 years. She has never used smokeless tobacco.  Alcohol use:   reports previous alcohol use.  Drug use:  reports no history of drug use.      Family History:  Family History   Problem Relation Age of Onset   ??? Heart disease Mother    ??? Hypertension Mother    ??? Dementia Mother    ??? Arthritis Mother    ??? Heart disease Father    ??? Thyroid disease Father    ??? Glaucoma Father    ??? Alcohol abuse Father    ??? Heart attack Father 63   ??? Arthritis Father    ??? Hypertension Father         Massive heart attack   ??? Arthritis Sister    ??? Glaucoma Sister    ??? Hypertension Sister    ??? Diabetes Sister    ??? Diabetes Daughter    ??? Diabetes Paternal Aunt    ??? No Known Problems Brother    ??? No Known Problems Maternal Aunt    ??? No Known Problems Maternal Uncle    ??? No Known Problems Paternal Uncle    ??? No Known Problems Maternal Grandmother    ??? No Known Problems Maternal Grandfather    ??? No Known Problems Paternal Grandmother    ??? No Known Problems Paternal Grandfather    ??? Arthritis Sister         Noticeable in extremities   ??? COPD Sister    ??? Depression Sister    ??? Hypertension Sister    ??? Glaucoma Sister    ??? Asthma Daughter         Florencia Reasons   ??? Hypertension Daughter    ??? Miscarriages / [place] Daughter    ??? Cancer Maternal Aunt         Died  more than 34yrs ago   ??? Early death Maternal Aunt    ??? Diabetes Paternal Aunt         4 Aunts   ??? Kidney disease Paternal Aunt    ??? Amblyopia Neg Hx    ??? Blindness Neg Hx    ??? Cancer Neg Hx    ??? Cataracts Neg Hx    ??? Macular degeneration Neg Hx    ??? Retinal detachment Neg Hx    ??? Strabismus Neg Hx    ??? Stroke Neg Hx            Review of Systems:    Pertinent review of systems is noted in the HPI.           Physical Exam      BP 126/72  - Pulse 60  - Temp 36.9 ??C (98.4 ??F) (Oral)  - Wt 75.8 kg (167 lb)  - BMI 26.16 kg/m??       BP Readings from Last 3 Encounters:   04/02/21 126/72   04/01/21 167/78   02/10/21 164/73       Wt Readings from Last 3 Encounters:   04/02/21 75.8 kg (167 lb)   04/01/21 71.7 kg (158 lb)   01/06/21 74.8 kg (164 lb 12.8 oz)       She appears well, in no apparent distress.  Alert and oriented times three, pleasant and cooperative. Vital signs are as documented in vital signs section.   Normal sclerae/conjunctivae.  Mucous membranes are moist. Neck is supple without goiter.  Cor is regular rate and rhythm with 2/6 systolic murmur.  Lungs are clear throughout with normal respiratory effort. Bowel sounds are normal..  No lower extremity edema.  Status post left total knee arthroplasty.  Bony swelling of osteoarthritis in right knee.     Brief Diabetic foot exam:    Monofilament test 8 of 8 sites normal   Visual foot inspection - Skin:  normal; Nails:  too long; Foot deformities:  none

## 2021-04-03 NOTE — Unmapped (Signed)
[name]  [facility] Internal Medicine Clinical Staff  Dear Dr. Sharlyne Pacas,   During our visit, I failed to acknowledge my inability to stand for an extended time, such as washing dishes, etc. ??I mentioned that I can only walk a mile with the help of a walker, whereas prior to the pandemic, I was walking 5 miles/day and doing water aerobics 4-5x/week. I am not as agile as I was either. Last week I drew a tub full of water to relax my muscles in essential oils and sea salt and thought of calling 911 to get me out of the tub. Finally, with the help of the sidebars and determination at the risk of falling, I finally pulled myself up and out of the tub.     If declared disabled, I'd have access to helpful living aids. One more thing I forg to mention during our visit, ??but it has escaped my memory...     Thank you kindly,   Kayla Knight. [name]

## 2021-04-04 DIAGNOSIS — G478 Other sleep disorders: Principal | ICD-10-CM

## 2021-04-04 DIAGNOSIS — R413 Other amnesia: Principal | ICD-10-CM

## 2021-04-04 DIAGNOSIS — R2681 Unsteadiness on feet: Principal | ICD-10-CM

## 2021-04-04 DIAGNOSIS — I10 Essential (primary) hypertension: Principal | ICD-10-CM

## 2021-04-04 LAB — HEMOGLOBIN A1C
ESTIMATED AVERAGE GLUCOSE: 134 mg/dL
HEMOGLOBIN A1C: 6.3 % — ABNORMAL HIGH (ref 4.9–6.1)

## 2021-04-04 NOTE — Unmapped (Signed)
The [facility] has made a second and final attempt to reach this patient to refill the following medication:Repatha.      We have left voicemails on the following phone numbers: [phone].    Dates contacted: 03/27/21, 04/04/21  Last scheduled delivery: 01/03/21    The patient may be at risk of non-compliance with this medication. The patient should call the [facility] at [phone] (option 4) to refill medication.    Chairty Toman Celedonio Savage   [facility]

## 2021-04-08 NOTE — Unmapped (Signed)
[facility] Refill Coordination Note    Specialty Medication(s) to be Shipped:   General Specialty: Repatha    Other medication(s) to be shipped: No additional medications requested for fill at this time     Kayla Knight, DOB: [birth year]  Phone: [phone] (home)       All above HIPAA information was verified with patient.     Was a Nurse, learning disability used for this call? No    Completed refill call assessment today to schedule patient's medication shipment from the [facility] [phone]).  All relevant notes have been reviewed.     Specialty medication(s) and dose(s) confirmed: Regimen is correct and unchanged.   Changes to medications: Victory reports no changes at this time.  Changes to insurance: No  New side effects reported not previously addressed with a pharmacist or physician: None reported  Questions for the pharmacist: No    Confirmed patient received a Conservation officer, historic buildings and a Surveyor, mining with first shipment. The patient will receive a drug information handout for each medication shipped and additional FDA Medication Guides as required.       DISEASE/MEDICATION-SPECIFIC INFORMATION        For patients on injectable medications: Patient currently has 0 doses left.  Next injection is scheduled for Unknown.    SPECIALTY MEDICATION ADHERENCE     Medication Adherence    Patient reported X missed doses in the last month: 1  Specialty Medication: Repatha 140mg /ml  Patient is on additional specialty medications: No  Patient is on more than two specialty medications: No              Were doses missed due to medication being on hold? No    Repatha 140 mg/ml: 0 days of medicine on hand       REFERRAL TO PHARMACIST     Referral to the pharmacist: Not needed      [facility]     Shipping address confirmed in Epic.     Delivery Scheduled: Yes, Expected medication delivery date: 04/09/21.     Medication will be delivered via Same Day Courier to the prescription address in Epic WAM.    Nancy Nordmann [facility] Specialty Technician

## 2021-04-14 ENCOUNTER — Ambulatory Visit: Admit: 2021-04-14 | Discharge: 2021-04-15 | Payer: MEDICARE

## 2021-04-14 DIAGNOSIS — I10 Essential (primary) hypertension: Principal | ICD-10-CM

## 2021-04-14 DIAGNOSIS — E1169 Type 2 diabetes mellitus with other specified complication: Principal | ICD-10-CM

## 2021-04-14 DIAGNOSIS — I701 Atherosclerosis of renal artery: Principal | ICD-10-CM

## 2021-04-14 DIAGNOSIS — E785 Hyperlipidemia, unspecified: Principal | ICD-10-CM

## 2021-04-14 DIAGNOSIS — I150 Renovascular hypertension: Principal | ICD-10-CM

## 2021-04-18 MED ORDER — FAMOTIDINE 20 MG TABLET
ORAL_TABLET | Freq: Two times a day (BID) | ORAL | 3 refills | 30 days | Status: CP
Start: 2021-04-18 — End: 2021-05-18

## 2021-04-20 NOTE — Unmapped (Signed)
DIVISION OF CARDIOLOGY  [employer] [place]        Date of Service: 04/14/2021      PCP: Referring Provider:   Dora Sims, MD  [address] [facility] Internal Medicine  [place] [address]  Phone: [phone]  Fax: [phone] Virgina Evener, MD  [address] [facility] Internal Medicine  [place],  [place] [address]  Phone: [phone]  Fax: [phone]     ASSESSMENT & PLAN:   Kayla Knight is a 80 y.o. female w/PMHx of HTN, HLD who presents for routine FU.    Hypertension, unspecified type  Patient reports SBP >170 at home, but overall her BPs have noted better control compared to a year ago on her current regimen of Norvasc 5 mg daily and Losartan- HCTZ 100/25 mg daily. She is trying more aggressive dietary measures too.      Return in about 6 months (around 10/15/2021) for Recheck.        SUBJECTIVE:        Reason for Consultation: tongue soreness, HTN    History of Present Illness: Kayla Knight is a 80 y.o. female w/PMHx of HTN, HLD who presents for concern for medication adverse effect.    Kayla Knight has a history of longstanding hypertension previously well controlled on maxzide, but requiring increasing medciations in 2020-2021. Additionally had admission for HTN urgency in 10/2019.  Renal artery duplex 01-15-20 revealed bilateral renal artery stenosis, for which he underwent left renal artery stenting on 01-29-20; angiography showed a 60% right renal artery stenosis so no intervention was completed.  Significant bruising on ASA, hence she stopped taking it, however she continues to be on Plavix.     She denies complaints of chest pain, dyspnea, exertional dyspnea, fatigue, palpitations, orthopnea, PND.    Cardiovascular History:  Moderate mitral regurgitation  Hypertension (20 years)  History of anemia in her 31s    I have independently reviewed of all the diagnostic studies. I have reviewed the old medical records from [facility] prior to this clinic visit.     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 The left ventricle is normal in size with mildly to moderately increased  wall thickness.  ????2. The left ventricular systolic function is normal, LVEF is visually  estimated at > 55%.  ????3. There is moderate mitral valve regurgitation.  ????4. The aortic valve is trileaflet with mildly thickened leaflets with normal  excursion.  ????5. There is mild aortic regurgitation.  ????6. The left atrium is mildly dilated in size.  ????7. The right ventricle is normal in size, with normal systolic function.   Stress test     Cardiac catheterization     [id number] Genotype     Rhythm Monitoring     Cardiac CT/MRI     Electrophysiology      Cardiovascular Surgery     Peripheral Vascular Studies Renal Angiogram 1. Bilateral renal artery stenosis including heavily calcified serial 80% lesions in the left renal artery and 60% right renal artery stenosis at a bifurcation  2. Successful intervention on left renal artery with placement of an Onyx drug eluting stent       I have personally reviewed the recent imaging studies on this patient.    Past medical history:  Patient Active Problem List   Diagnosis   ??? Essential hypertension, benign   ???  Primary osteoarthritis of right knee   ??? Idiopathic peripheral neuropathy   ??? Non-rheumatic tricuspid valve insufficiency   ??? MR (mitral regurgitation)   ??? Mixed stress and urge urinary incontinence   ??? 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 Neck pain   ??? Aortic valve disease   ??? Lipoma of torso   ??? Skin lesion   ??? Hearing loss   ??? Lichen sclerosus et atrophicus of the vulva   ??? 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 Renal artery stenosis, native, bilateral (CMS-HCC)   ??? Hyperlipidemia   ??? Aftercare following joint replacement   ??? Presence of left artificial knee joint   ??? Hypokalemia   ??? History of chest pain   ??? Abnormal EKG   ??? Decreased GFR       Past surgical history:  Past Surgical History:   Procedure Laterality Date   ??? CATARACT EXTRACTION W/ INTRAOCULAR LENS IMPLANT Left 02/27/2016   ??? EYE SURGERY  01/30/16    Cataract surgery is scheduled.   ??? JOINT REPLACEMENT  Knee   ??? KNEE CARTILAGE SURGERY Right 2007, 2008    x 2   ??? KNEE SURGERY      pt reports total left knee joint replacement surgery around 2016, 2017   ??? PLANTAR FASCIECTOMY Left    ??? PR REVASCULARIZE FEM/POP ARTERY,ANGIOPLASTY/STENT N/A 01/29/2020    Procedure: Peripheral Angiography W Intervetion;  Surgeon: Alvira Philips, MD;  Location: [facility] C[facility];  Service: Cardiology   ??? PR UPPER GI ENDOSCOPY,BIOPSY N/A 04/01/2021    Procedure: UGI ENDOSCOPY; WITH BIOPSY, SINGLE OR MULTIPLE;  Surgeon: Jarvis Morgan, MD;  [facility];  Service: Gastroenterology   ??? PR XCAPSL CTRC RMVL INSJ IO LENS PROSTH W/O ECP Right 02/13/2016    Procedure: EXTRACAPSULAR CATARACT REMOVAL W/INSERTION OF INTRAOCULAR LENS PROSTHESIS, MANUAL OR MECHANICAL TECHNIQUE;  Surgeon: Garey Ham, MD;  [facility];  Service: Ophthalmology   ??? PR XCAPSL CTRC RMVL INSJ IO LENS PROSTH W/O ECP Left 02/27/2016    Procedure: EXTRACAPSULAR CATARACT REMOVAL W/INSERTION OF INTRAOCULAR LENS PROSTHESIS, MANUAL OR MECHANICAL TECHNIQUE;  Surgeon: Garey Ham, MD;  [facility]: [facility];  Service: Ophthalmology   ??? RENAL ARTERY STENT  01/11/2020   ??? ROOT CANAL     ??? TUBAL LIGATION  [facility]   ??? WISDOM TOOTH EXTRACTION         Medications:   Patient's Medications   New Prescriptions    No medications on file   Previous Medications    AMLODIPINE (NORVASC) 5 MG TABLET    Take 1 tablet (5 mg total) by mouth daily.    CLOPIDOGREL (PLAVIX) 75 MG TABLET    Take 1 tablet (75 mg total) by mouth in the morning.    EMPTY CONTAINER (SHARPS-A-GATOR DISPOSAL SYSTEM) MISC    Use as directed for sharps disposal    EVOLOCUMAB (REPATHA SURECLICK) 140 MG/ML PNIJ    Inject the contents of 1 pen (140 mg) under the skin every fourteen (14) days.    LOSARTAN-HYDROCHLOROTHIAZIDE (HYZAAR) 100-25 MG PER TABLET    TAKE 1 TABLET BY MOUTH DAILY.    PANTOPRAZOLE (PROTONIX) 40 MG TABLET    Take 1 tablet (40 mg total) by mouth in the morning.   Modified Medications    Modified Medication Previous Medication    FAMOTIDINE (PEPCID) 20 MG TABLET famotidine (PEPCID) 20  MG tablet       TAKE 1 TABLET (20 MG TOTAL) BY MOUTH TWO (2) TIMES A DAY.    Take 1 tablet (20 mg total) by mouth Two (2) times a day.   Discontinued Medications    No medications on file       Allergies:  Allergies   Allergen Reactions   ??? Penicillins Itching and Swelling   ??? Lisinopril Cough   ??? Statins-Hmg-Coa Reductase Inhibitors      Ineffective at controlling cholesterol when tried in the past.   ??? Sulfa (Sulfonamide Antibiotics) Itching       Social History:  She  reports that she quit smoking about 36 years ago. Her smoking use included cigarettes. She smoked 0.00 packs per day for 0.00 years. She has never used smokeless tobacco. She reports previous alcohol use. She reports that she does not use drugs.    Family History:  Her family history includes Alcohol abuse in her father; Arthritis in her father, mother, sister, and sister; Asthma in her daughter; COPD in her sister; Cancer in her maternal aunt; Dementia in her mother; Depression in her sister; Diabetes in her daughter, paternal aunt, paternal aunt, and sister; Early death in her maternal aunt; Glaucoma in her father, sister, and sister; Heart attack (age of onset: 7) in her father; Heart disease in her father and mother; Hypertension in her daughter, father, mother, sister, and sister; Kidney disease in her paternal aunt; Miscarriages / Stillbirths in her daughter; No Known Problems in her brother, maternal aunt, maternal grandfather, maternal grandmother, maternal uncle, paternal grandfather, paternal grandmother, and paternal uncle; Thyroid disease in her father.    Review of Systems  10 systems were reviewed and negative except as noted in HPI.      OBJECTIVE:       Physical Exam  BP 148/81 (BP Site: L Arm, BP Position: Sitting, BP Cuff Size: Large)  - Pulse 69  - Resp 18  - Ht 170.2 cm (5' 7)  - Wt 74.9 kg (165 lb 3.2 oz)  - SpO2 98%  - BMI 25.87 kg/m??    Wt Readings from Last 3 Encounters:   04/14/21 74.9 kg (165 lb 3.2 oz)   04/02/21 75.8 kg (167 lb)   04/01/21 71.7 kg (158 lb)       General:  Alert, no distress.   Eyes:  Intact, sclerae anicteric.   ENT: Moist mucous membranes. Supple. No obvious thyromegaly.   Respiratory:   CTAB bilaterally with normal WOB.   Cardiovascular:  No carotid bruit, JVD normal at 90 degrees, 4/6 decrescendo murmur heard throughout the precordium, RRR, no rubs or gallops  No edema bilaterally. Pulses full and equal throughout   Gastrointestinal:   nondistented   Musculoskeletal: Normal bulk   Skin: Warm, well perfused.   Neurologic: No focal deficits.       Most recent labs   Lab Results   Component Value Date    Sodium 140 04/02/2021    Sodium 137 04/24/2019    Potassium 3.7 04/02/2021    Potassium 3.7 04/24/2019    Chloride 107 04/02/2021    Chloride 103 04/24/2019    CO2 26.0 04/02/2021    CO2 30.3 04/24/2019    BUN 13 04/02/2021    BUN 12.00 04/24/2019    Creatinine 0.94 (H) 04/02/2021    Creatinine 0.8 04/24/2019    Magnesium 2.0 11/13/2019     Lab Results   Component Value Date    HGB  12.3 04/02/2021    HGB 12.7 10/20/2018    MCV 73.7 (L) 04/02/2021    MCV 77 (L) 10/20/2018    Platelet 344 04/02/2021    Platelet 343 10/20/2018     Lab Results   Component Value Date    Cholesterol 158 04/02/2021    Cholesterol, Total 248 (H) 10/20/2018    Triglycerides 188 (H) 04/02/2021    Triglycerides 121 10/20/2018    HDL 53 04/02/2021    HDL 49 10/20/2018    Non-HDL Cholesterol 105 04/02/2021    LDL calculated 174.8 (H) 10/20/2018    LDL Calculated 67 04/02/2021    Hemoglobin A1C 6.3 (H) 04/02/2021    Hemoglobin A1C 6.0 04/24/2019    TSH 1.040 04/02/2021    TSH 0.88 10/06/2016    PRO-BNP 88.2 02/17/2020    INR 0.91 08/13/2018

## 2021-05-19 ENCOUNTER — Ambulatory Visit: Admit: 2021-05-19 | Discharge: 2021-05-20 | Payer: MEDICARE

## 2021-05-19 NOTE — Unmapped (Signed)
[facility]  Adult Audiology     [facility] Adult Audiology Evaluation Appointment      PATIENT: Kayla Knight, Kayla Knight  DOB: Feb 12, 1941  MRN: [id number]  DOS: 05/19/2021     PLEASE SEE AUDIOGRAM INCLUDING FULL REPORT IN MEDIA MANAGER TAB.     HISTORY      Youlanda Tomassetti is a 80 y.o. female with a known sensorineural hearing loss seen today for annual hearing evaluation. Patient was unaccompanied to today's appointment. Today, patient reports continued hearing difficulties and tinnitus. She states she has particular difficulty hearing her grandson and is aware of needing speaker phone to hear optimally on the phone. Patient denies vertigo, otalgia, aural pressure.    HEARING EVALUATION      Otoscopy  RIGHT Ear: clear external auditory canal  LEFT Ear: clear external auditory canal    Tympanometry - using a 226 Hz probe tone  RIGHT Ear: Type Ad tympanogram, consistent with normal middle ear pressure and volume and hypercompliance of middle ear  LEFT Ear: Type Ad tympanogram, consistent with normal middle ear pressure and volume and hypercompliance of middle ear    Pure Tone and Speech Audiometry  Today's behavioral evaluation was completed using conventional audiometry via insert earphones with good reliability.     RIGHT Ear: Normal sloping to moderately-severe sensorineural hearing loss  ??? Speech Recognition Threshold (SRT): 20 dB HL  ??? Word Recognition Score (Recorded NU-6): 88% at 70 dB HL    LEFT Ear: Normal sloping to moderately-severe sensorineural hearing loss   ??? Speech Recognition Threshold (SRT): 20 dB HL  ??? Word Recognition Score (Recorded NU-6): 88% at 70 dB HL    Impressions   Results are overall stable when compared to those on 05/01/2020.     COUNSELING     Patient was counseled on today's results and expressed understanding. Patient reports interest in returning to clinic for hearing aid consultation appointment and possible demo of hearing aids.        RECOMMENDATIONS   ??? Consider HA consultation  ??? Return to clinic in approximately one year for hearing evaluation       Gracelyn Nurse, B.A., B.S.  Audiology Graduate Student Clinician    I was physically present and immediately available to direct and supervise tasks that were related to patient management. The direction and supervision was continuous throughout the time these tasks were performed.    Care Provider(s) wore appropriate PPE throughout entire appointment (face mask and eye protection).  Patient's companion also wore face mask appropriately for the entire appointment (if applicable).  Patient did wear face mask appropriately for entirety of appointment.       Charges associated with today's visit:  ??? CPT 92557 - Comprehensive Audio Eval & Speech Recognition  ??? CPT E974542 - Tympanometry      Visit Time: 45 min    Para Skeans, AuD  Clinical Audiologist  [facility] Adult Audiology Program  Scheduling: [phone]

## 2021-06-09 NOTE — Unmapped (Signed)
Patient needs refill on    evolocumab (REPATHA SURECLICK) 140 mg/mL PnIj    A company named [employer] will be calling to verify the script    [phone]

## 2021-06-16 NOTE — Unmapped (Signed)
Please have patient make an appointment for evaluation

## 2021-06-16 NOTE — Unmapped (Signed)
Patient is having strange pains in her side  Had a stent put in her kidney.  She was wondering if there is a problem with that or not.  Would like to speak to someone and get some advice about it.    [phone]

## 2021-06-17 ENCOUNTER — Ambulatory Visit: Admit: 2021-06-17 | Discharge: 2021-06-18 | Payer: MEDICARE | Attending: Internal Medicine | Primary: Internal Medicine

## 2021-06-17 DIAGNOSIS — R109 Unspecified abdominal pain: Principal | ICD-10-CM

## 2021-06-17 DIAGNOSIS — R718 Other abnormality of red blood cells: Principal | ICD-10-CM

## 2021-06-17 LAB — URINALYSIS
BILIRUBIN UA: NEGATIVE
GLUCOSE UA: NEGATIVE
KETONES UA: NEGATIVE
LEUKOCYTE ESTERASE UA: NEGATIVE
NITRITE UA: NEGATIVE
PH UA: 6 (ref 5.0–8.0)
PROTEIN UA: NEGATIVE
RBC UA: 2 /HPF (ref 0–3)
SPECIFIC GRAVITY UA: 1.02 (ref 1.002–1.030)
SQUAMOUS EPITHELIAL: 2 /HPF (ref 0–5)
UROBILINOGEN UA: 0.2
WBC UA: 1 /HPF (ref 0–3)

## 2021-06-17 LAB — IRON & TIBC
IRON SATURATION: 19 % — ABNORMAL LOW (ref 20–55)
IRON: 73 ug/dL
TOTAL IRON BINDING CAPACITY: 382 ug/dL (ref 250–425)

## 2021-06-17 LAB — CBC W/ AUTO DIFF
HEMATOCRIT: 38.4 % (ref 35.0–45.0)
HEMOGLOBIN: 12.4 g/dL (ref 11.3–15.5)
LYMPHOCYTES ABSOLUTE COUNT: 2.1 10*9/L (ref 1.0–4.8)
LYMPHOCYTES RELATIVE PERCENT: 32.4 %
MEAN CORPUSCULAR HEMOGLOBIN CONC: 32.4 g/dL (ref 31.0–36.0)
MEAN CORPUSCULAR HEMOGLOBIN: 24.9 pg — ABNORMAL LOW (ref 26.0–32.0)
MEAN CORPUSCULAR VOLUME: 77 fL — ABNORMAL LOW (ref 89.0–97.0)
MEAN PLATELET VOLUME: 6.4 fL — ABNORMAL LOW (ref 6.5–8.9)
MONOCYTES ABSOLUTE COUNT: 0.4 10*9/L (ref 0.0–0.8)
MONOCYTES RELATIVE PERCENT: 7.4 %
NEUTROPHILS ABSOLUTE COUNT: 4.1 10*9/L (ref 1.8–7.0)
NEUTROPHILS RELATIVE PERCENT: 60.2 %
PLATELET COUNT: 385 10*9/L (ref 140–440)
RED BLOOD CELL COUNT: 5 10*12/L (ref 3.80–5.10)
RED CELL DISTRIBUTION WIDTH: 16 % — ABNORMAL HIGH (ref 11.0–15.0)
WBC ADJUSTED: 6.6 10*9/L (ref 3.8–10.8)

## 2021-06-17 LAB — FERRITIN: FERRITIN: 20.6 ng/mL

## 2021-06-17 NOTE — Unmapped (Signed)
Assessment and Plan      1. Flank pain    2. Microcytosis        Exam suggest muscular strain.  Reviewed differential diagnosis and previous imaging report.  Check U/A and CBC Follow up pending results.    No problem-specific Assessment & Plan notes found for this encounter.      Orders Placed This Encounter   Procedures   ??? Urinalysis   ??? CBC w/ Differential   ??? Ferritin   ??? Iron & TIBC       Requested Prescriptions      No prescriptions requested or ordered in this encounter       There are no discontinued medications.    No follow-ups on file.    Signs and symptoms that should prompt return sooner than scheduled were reviewed with the patient.      PCMH Components:     Medication adherence and barriers to the treatment plan have been addressed. Opportunities to optimize healthy behaviors have been discussed. Patient / caregiver voiced understanding.        HPI      Chief Complaint    Chief Complaint   Patient presents with   ??? Flank Pain     Left side x 2 months       Kayla Knight presents for evaluation and management of the following concern(s):       She notes left flank pain for the last several weeks.  She has left renal artery stents. Paini is worse when washing dishes.   It is iintermittent.  It is tender to palpation.  She denies bowel habit changes, gross hematuria, fever. The     She had dark bowel movements last week. She had consumed beets.     BP has been 130's systolic at home.    Vaginitis has resolved but is normal.      Left foot is painful.  Walking is impaired.  She saw Dr. Braxton Feathers in June      Health Maintenance reviewed - no intervention today      I have reviewed the patient's medical history in detail and updated the computerized patient record.          Patient Active Problem List   Diagnosis   ??? Essential hypertension, benign   ??? Primary osteoarthritis of right knee   ??? Idiopathic peripheral neuropathy   ??? Non-rheumatic tricuspid valve insufficiency   ??? MR (mitral regurgitation)   ??? Mixed stress and urge urinary incontinence   ??? 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 Neck pain   ??? Aortic valve disease   ??? Lipoma of torso   ??? Skin lesion   ??? Hearing loss   ??? Lichen sclerosus et atrophicus of the vulva   ??? 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 Renal artery stenosis, native, bilateral (CMS-HCC)   ??? Hyperlipidemia   ??? Aftercare following joint replacement   ??? Presence of left artificial knee joint   ??? Hypokalemia   ??? History of chest pain   ??? Abnormal EKG   ??? Decreased GFR         Allergies:  Penicillins, Lisinopril, Statins-hmg-coa reductase inhibitors, and Sulfa (sulfonamide antibiotics)    Medications:   Outpatient Medications Prior to Visit   Medication Sig Dispense Refill   ??? amLODIPine (NORVASC) 5 MG tablet Take 1 tablet (5 mg total) by mouth daily. 90 tablet 3   ???  clopidogreL (PLAVIX) 75 mg tablet Take 1 tablet (75 mg total) by mouth in the morning. 30 tablet 11   ??? evolocumab (REPATHA SURECLICK) 140 mg/mL PnIj Inject the contents of 1 pen (140 mg) under the skin every fourteen (14) days. 6 mL 3   ??? famotidine (PEPCID) 20 MG tablet TAKE 1 TABLET (20 MG TOTAL) BY MOUTH TWO (2) TIMES A DAY. 60 tablet 3   ??? losartan-hydrochlorothiazide (HYZAAR) 100-25 mg per tablet TAKE 1 TABLET BY MOUTH DAILY. 90 tablet 3   ??? pantoprazole (PROTONIX) 40 MG tablet      ??? empty container (SHARPS-A-GATOR DISPOSAL SYSTEM) Misc Use as directed for sharps disposal 1 each 2     No facility-administered medications prior to visit.       Medical History:  Past Medical History:   Diagnosis Date   ??? Anemia     past history, over 40 yrs ago   ??? Arthritis     hand, knees, joints   ??? Asthma     past history, no recent attacks   ??? Asthma     patient stated due to asthmatic medication   ??? Cataract 2008    Surgery   ??? Diabetes mellitus (CMS-HCC)     Type II, on 9/15 patient stated she was not longer diabetic, not taking meds   ??? Dry eye    ??? Hearing impairment    ??? Heart murmur 2016    Dr. Verdie Shire @ [facility]   ??? Heart valve disease     Heart mumur, pt reports diagnosis 10 yrs ago (05/10/20)   ??? History of stent insertion of renal artery 01/29/2020    ASA indefinitely and plavix x 6 months per procedure note   ??? Hypertension     pt reports taking meds since about 1995   ??? Neuromuscular disorder (CMS-HCC) 2014    Feet & Legs   ??? Tear of meniscus of knee     4.5 yrs ago   ??? Urinary incontinence     seen a doctor about nighttime bathroom use (adressed in 2019)       Surgical History:  Past Surgical History:   Procedure Laterality Date   ??? CATARACT EXTRACTION W/ INTRAOCULAR LENS IMPLANT Left 02/27/2016   ??? EYE SURGERY  01/30/16    Cataract surgery is scheduled.   ??? JOINT REPLACEMENT  Knee   ??? KNEE CARTILAGE SURGERY Right 2007, 2008    x 2   ??? KNEE SURGERY      pt reports total left knee joint replacement surgery around 2016, 2017   ??? PLANTAR FASCIECTOMY Left    ??? PR REVASCULARIZE FEM/POP ARTERY,ANGIOPLASTY/STENT N/A 01/29/2020    Procedure: Peripheral Angiography W Intervetion;  Surgeon: Alvira Philips, MD;  Location: [facility] CATH;  Service: Cardiology   ??? PR UPPER GI ENDOSCOPY,BIOPSY N/A 04/01/2021    Procedure: UGI ENDOSCOPY; WITH BIOPSY, SINGLE OR MULTIPLE;  Surgeon: Jarvis Morgan, MD;  Location: [facility]ED[facility];  Service: Gastroenterology   ??? PR XCAPSL CTRC RMVL INSJ IO LENS PROSTH W/O ECP Right 02/13/2016    Procedure: EXTRACAPSULAR CATARACT REMOVAL W/INSERTION OF INTRAOCULAR LENS PROSTHESIS, MANUAL OR MECHANICAL TECHNIQUE;  Surgeon: Garey Ham, MD;  Location[facility];  Service: Ophthalmology   ??? PR XCAPSL CTRC RMVL INSJ IO LENS PROSTH W/O ECP Left 02/27/2016    Procedure: EXTRACAPSULAR CATARACT REMOVAL W/INSERTION OF INTRAOCULAR LENS PROSTHESIS, MANUAL OR MECHANICAL TECHNIQUE;  Surgeon: Garey Ham, MD;  Location: [facility];  Service: Ophthalmology   ??? RENAL ARTERY STENT  01/11/2020   ??? ROOT CANAL     ??? TUBAL LIGATION  [facility]    [facility]   ??? WISDOM TOOTH EXTRACTION         Social History:  Tobacco use:   reports that she quit smoking about 37 years ago. Her smoking use included cigarettes. She smoked 0.00 packs per day for 0.00 years. She has never used smokeless tobacco.  Alcohol use:   reports previous alcohol use.  Drug use:  reports no history of drug use.      Family History:  Family History   Problem Relation Age of Onset   ??? Heart disease Mother    ??? Hypertension Mother    ??? Dementia Mother    ??? Arthritis Mother    ??? Heart disease Father    ??? Thyroid disease Father    ??? Glaucoma Father    ??? Alcohol abuse Father    ??? Heart attack Father 43   ??? Arthritis Father    ??? Hypertension Father         Massive heart attack   ??? Arthritis Sister    ??? Glaucoma Sister    ??? Hypertension Sister    ??? Diabetes Sister    ??? Diabetes Daughter    ??? Diabetes Paternal Aunt    ??? No Known Problems Brother    ??? No Known Problems Maternal Aunt    ??? No Known Problems Maternal Uncle    ??? No Known Problems Paternal Uncle    ??? No Known Problems Maternal Grandmother    ??? No Known Problems Maternal Grandfather    ??? No Known Problems Paternal Grandmother    ??? No Known Problems Paternal Grandfather    ??? Arthritis Sister         Noticeable in extremities   ??? COPD Sister    ??? Depression Sister    ??? Hypertension Sister    ??? Glaucoma Sister    ??? Asthma Daughter         Florencia Reasons   ??? Hypertension Daughter    ??? Miscarriages / [place] Daughter    ??? Cancer Maternal Aunt         Died more than 46yrs ago   ??? Early death Maternal Aunt    ??? Diabetes Paternal Aunt         4 Aunts   ??? Kidney disease Paternal Aunt    ??? Amblyopia Neg Hx    ??? Blindness Neg Hx    ??? Cancer Neg Hx    ??? Cataracts Neg Hx    ??? Macular degeneration Neg Hx    ??? Retinal detachment Neg Hx    ??? Strabismus Neg Hx    ??? Stroke Neg Hx            Review of Systems:    Pertinent review of systems is noted in the HPI.           Physical Exam      BP 140/82 (BP Site: R Arm, BP Position: Sitting)  - Pulse 60  - Temp 36.8 ??C (98.2 ??F) (Oral)  - Wt 74.4 kg (164 lb)  - BMI 25.69 kg/m??       BP Readings from Last 3 Encounters:   06/17/21 140/82   04/14/21 148/81   04/02/21 126/72       Wt Readings from Last 3 Encounters:  06/17/21 74.4 kg (164 lb)   04/14/21 74.9 kg (165 lb 3.2 oz)   04/02/21 75.8 kg (167 lb)         She appears well, in no apparent distress.  Alert and oriented times three, pleasant and cooperative. Vital signs are as documented in vital signs section. Normal sclerae/conjunctivae. Mouth and throat are normal. Cor is regular rate and rhythm with normal S1 and S2.  3/6 systolic murmur.  Lungs are clear throughout with normal respiratory effort. No costovertebral angle tenderness or flank rash.  Tenderness along left costal margin.    *

## 2021-06-23 NOTE — Unmapped (Signed)
[facility] Refill Coordination Note    Specialty Medication(s) to be Shipped:   General Specialty: Repatha    Other medication(s) to be shipped: No additional medications requested for fill at this time     Kayla Knight, DOB: Dec 18, 1940  Phone: [phone] (home)       All above HIPAA information was verified with patient.     Was a Nurse, learning disability used for this call? No    Completed refill call assessment today to schedule patient's medication shipment from the [facility] [phone]).  All relevant notes have been reviewed.     Specialty medication(s) and dose(s) confirmed: Regimen is correct and unchanged.   Changes to medications: Trynity reports no changes at this time.  Changes to insurance: No  New side effects reported not previously addressed with a pharmacist or physician: None reported  Questions for the pharmacist: No    Confirmed patient received a Conservation officer, historic buildings and a Surveyor, mining with first shipment. The patient will receive a drug information handout for each medication shipped and additional FDA Medication Guides as required.       DISEASE/MEDICATION-SPECIFIC INFORMATION        For patients on injectable medications: Patient currently has 0 doses left.  Next injection is scheduled for 06/30/21.    SPECIALTY MEDICATION ADHERENCE     Medication Adherence    Patient reported X missed doses in the last month: 0  Specialty Medication: Repatha 140mg /ml  Patient is on additional specialty medications: No  Patient is on more than two specialty medications: No              Were doses missed due to medication being on hold? No    Repatha 140 mg/ml: 0 days of medicine on hand        REFERRAL TO PHARMACIST     Referral to the pharmacist: Not needed      [facility]     Shipping address confirmed in Epic.     Delivery Scheduled: Yes, Expected medication delivery date: 06/27/21.     Medication will be delivered via Same Day Courier to the prescription address in Epic WAM.    Nancy Nordmann [facility] Specialty Technician

## 2021-06-26 DIAGNOSIS — E782 Mixed hyperlipidemia: Principal | ICD-10-CM

## 2021-06-27 MED FILL — REPATHA SURECLICK 140 MG/ML SUBCUTANEOUS PEN INJECTOR: SUBCUTANEOUS | 84 days supply | Qty: 6 | Fill #1

## 2021-07-07 ENCOUNTER — Ambulatory Visit: Admit: 2021-07-07 | Discharge: 2021-07-08 | Payer: MEDICARE

## 2021-07-07 DIAGNOSIS — R051 Acute cough: Principal | ICD-10-CM

## 2021-07-07 DIAGNOSIS — J3489 Other specified disorders of nose and nasal sinuses: Principal | ICD-10-CM

## 2021-07-07 MED ORDER — BENZONATATE 200 MG CAPSULE
ORAL_CAPSULE | Freq: Three times a day (TID) | ORAL | 0 refills | 7 days | Status: CP | PRN
Start: 2021-07-07 — End: 2021-07-14

## 2021-07-07 NOTE — Unmapped (Signed)
[facility] AT [address] [place]  [facility] at [facility]:  Provider Note  07/07/2021    Patient Name: Kayla Knight    Date of Birth: 05-23-1941    MRN: [id number]     SUBJECTIVE:   Date of Service: 07/07/21     HPI: 80 y.o., female comes in for 3 day history of URI symptoms. Rhinorrhea, cough, nasal congestion. She does have some baseline cough at night that PCP is working up. However, states now with increased cough, mostly productive at night. Denies sore throat. Denies fever, chills, body aches. Denies shortness of breath.     Past Medical History:  Past Medical History:   Diagnosis Date   ??? Anemia     past history, over 40 yrs ago   ??? Arthritis     hand, knees, joints   ??? Asthma     past history, no recent attacks   ??? Asthma     patient stated due to asthmatic medication   ??? Cataract 2008    Surgery   ??? Diabetes mellitus (CMS-HCC)     Type II, on 9/15 patient stated she was not longer diabetic, not taking meds   ??? Dry eye    ??? Hearing impairment    ??? Heart murmur 2016    Dr. Verdie Shire @ [facility]   ??? Heart valve disease     Heart mumur, pt reports diagnosis 10 yrs ago (05/10/20)   ??? History of stent insertion of renal artery 01/29/2020    ASA indefinitely and plavix x 6 months per procedure note   ??? Hypertension     pt reports taking meds since about 1995   ??? Neuromuscular disorder (CMS-HCC) 2014    Feet & Legs   ??? Tear of meniscus of knee     4.5 yrs ago   ??? Urinary incontinence     seen a doctor about nighttime bathroom use (adressed in 2019)         Past Surgical History:  Past Surgical History:   Procedure Laterality Date   ??? CATARACT EXTRACTION W/ INTRAOCULAR LENS IMPLANT Left 02/27/2016   ??? EYE SURGERY  01/30/16    Cataract surgery is scheduled.   ??? JOINT REPLACEMENT  Knee   ??? KNEE CARTILAGE SURGERY Right 2007, 2008    x 2   ??? KNEE SURGERY      pt reports total left knee joint replacement surgery around 2016, 2017   ??? PLANTAR FASCIECTOMY Left    ??? PR REVASCULARIZE FEM/POP ARTERY,ANGIOPLASTY/STENT N/A 01/29/2020    Procedure: Peripheral Angiography W Intervetion;  Surgeon: Alvira Philips, MD;  Location[facility] CATH;  Service: Cardiology   ??? PR UPPER GI ENDOSCOPY,BIOPSY N/A 04/01/2021    Procedure: UGI ENDOSCOPY; WITH BIOPSY, SINGLE OR MULTIPLE;  Surgeon: Jarvis Morgan, MD;  Location: [facility];  Service: Gastroenterology   ??? PR XCAPSL CTRC RMVL INSJ IO LENS PROSTH W/O ECP Right 02/13/2016    Procedure: EXTRACAPSULAR CATARACT REMOVAL W/INSERTION OF INTRAOCULAR LENS PROSTHESIS, MANUAL OR MECHANICAL TECHNIQUE;  Surgeon: Garey Ham, MD;  Location: [facility];  Service: Ophthalmology   ??? PR XCAPSL CTRC RMVL INSJ IO LENS PROSTH W/O ECP Left 02/27/2016    Procedure: EXTRACAPSULAR CATARACT REMOVAL W/INSERTION OF INTRAOCULAR LENS PROSTHESIS, MANUAL OR MECHANICAL TECHNIQUE;  Surgeon: Garey Ham, MD;  Location: [facility];  Service: Ophthalmology   ??? RENAL ARTERY STENT  01/11/2020   ???  ROOT CANAL     ??? TUBAL LIGATION  1973    [facility]   ??? WISDOM TOOTH EXTRACTION          Medications:  Prior to Admission medications    Medication Sig Start Date End Date Taking? Authorizing Provider   amLODIPine (NORVASC) 5 MG tablet Take 1 tablet (5 mg total) by mouth daily. 09/24/20 06/17/21  Virgina Evener, MD   clopidogreL (PLAVIX) 75 mg tablet Take 1 tablet (75 mg total) by mouth in the morning. 02/18/21   Rudene Anda, MD   empty container [facility] DISPOSAL SYSTEM) Misc Use as directed for sharps disposal 11/07/20      evolocumab (REPATHA SURECLICK) 140 mg/mL PnIj Inject the contents of 1 pen (140 mg) under the skin every fourteen (14) days. 04/02/21   Virgina Evener, MD   losartan-hydrochlorothiazide (HYZAAR) 100-25 mg per tablet TAKE 1 TABLET BY MOUTH DAILY. 01/16/21 01/16/22  Deitra Mayo, MD   pantoprazole (PROTONIX) 40 MG tablet  06/04/21   Historical Provider, MD Allergies:  Allergies   Allergen Reactions   ??? Penicillins Itching and Swelling   ??? Lisinopril Cough   ??? Statins-Hmg-Coa Reductase Inhibitors      Ineffective at controlling cholesterol when tried in the past.   ??? Sulfa (Sulfonamide Antibiotics) Itching        ROS:  10 point ROS reviewed and negative unless otherwise specified in HPI.    OBJECTIVE:  Physical Exam:  Vitals:    07/07/21 0817   BP: 167/73   Pulse: 65   Resp: 16   Temp: 36.8 ??C (98.3 ??F)   SpO2: 97%       Physical Exam  Constitutional:       General: She is not in acute distress.     Appearance: Normal appearance. She is not ill-appearing or toxic-appearing.   HENT:      Head: Normocephalic and atraumatic.      Right Ear: Tympanic membrane, ear canal and external ear normal. Tympanic membrane is not erythematous or bulging.      Left Ear: Tympanic membrane, ear canal and external ear normal. Tympanic membrane is not erythematous or bulging.      Nose:      Right Sinus: No maxillary sinus tenderness or frontal sinus tenderness.      Left Sinus: No maxillary sinus tenderness or frontal sinus tenderness.      Mouth/Throat:      Mouth: Mucous membranes are moist.      Pharynx: Oropharynx is clear. Uvula midline. Posterior oropharyngeal erythema present.   Eyes:      Conjunctiva/sclera: Conjunctivae normal.      Pupils: Pupils are equal, round, and reactive to light.   Cardiovascular:      Rate and Rhythm: Normal rate and regular rhythm.   Pulmonary:      Effort: Pulmonary effort is normal.      Comments: LCTAB  Musculoskeletal:      Cervical back: Normal range of motion and neck supple.   Skin:     General: Skin is warm and dry.   Neurological:      Mental Status: She is alert and oriented to person, place, and time.          Lab Results: (Lab results reviewed)   No results found for this or any previous visit (from the past 168 hour(s)).    Radiology Results:  No results found.     ASSESSMENT:    Diagnoses:  Final diagnoses:   Rhinorrhea (Primary) Acute cough       PLAN:  COVID testing pending. Patient afebrile, nontoxic. LCTAB. Symptomatic treatment discussed. Return precautions given.     Patient on plavix, will need Cardiology clearance to stop plavix while on paxlovid. Otherwise, will need referral to [facility] if COVID positive.       SCREENING:  PCP:  Has PCP    SDOH:  No SDOH intervention needed- former smoker    Depression:  PHQ-2 Score: 0    PHQ-9 Score:      Edinburgh Score:      Screening complete, no depression identified / no further action needed today    This note was transcribed using Dragon voice recognition software, and may contain inadvertent misspellings or incorrect transcriptions

## 2021-07-07 NOTE — Unmapped (Signed)
COVID testing pending, please quarantine until testing results return.    You can use over the counter flonase nasal spray for nasal congestion/drainage  Tessalon for cough  Tylenol/ibuprofen for fever, pain  Hydration, your urine should be clear to pale yellow in color    Follow up with PCP if symptoms not improving. If develop shortness of breath, chest pain, please follow up here or at the emergency department for reevaluation.

## 2021-07-11 ENCOUNTER — Ambulatory Visit: Admit: 2021-07-11 | Discharge: 2021-07-12 | Payer: MEDICARE

## 2021-07-21 ENCOUNTER — Institutional Professional Consult (permissible substitution): Admit: 2021-07-21 | Discharge: 2021-07-22 | Payer: MEDICARE

## 2021-07-21 NOTE — Unmapped (Signed)
[facility]  Adult Audiology     HEARING AID CONSULTATION     PATIENT: Kayla Knight, Kayla Knight  DOB: 02-28-1941  MRN: [id number]  DOS: 07/21/2021     Kayla Knight presented to clinic today for a consultation visit today to discuss amplification options.      SUBJECTIVE REPORT   Patient identified the following listening needs/goals:  1. Hearing her grandson  2. Hearing on the phone  3. Tinnitus reduction    IMPRESSIONS   Discussed amplification style and technology level best suited for patient's lifestyle. Due to reported concerns/considerations as well as patient listening needs/goals, the following features were identified as important to the patient:  [x]  Hearing Aid style: RIC/RITE  [x]  Rechargeable batteries  []  Bluetooth connectivity.   []  Accessories: TV streamer, Remote microphone    PLAN   The following hearing aid manufacturers and models were discussed today:   ??? Signia Pure Charge & Go AX RIC/RITE    Patient provided with literature from the [facility]     ORDERING-- pending patient confirmation     Patient to contact clinic to confirm moving forward with ordering the below devices.      Right Ear Left Ear   Order Date N/A N/A   Location  [place] [employer] Signia Signia   Model Pure Charge & Go 3AX T Pure Charge & Go 3AX T   Color Scientist, research (physical sciences)    Serial Number     HAF Date     Furniture conservator/restorer strength and size 63M 63M   Slimtube Length  N/A N/A   Dome small TULIP small TULIP   Ear mold N/A N/A   Bluetooth     Battery Sports administrator Serial Number     Charger Warranty     Loss and Damage Claim      CPT Codes V5261 - HEARING AID DIGITAL BINAURAL BTE  and V5011 - HEARING AID FITTING ORIENTATION  V5261 - HEARING AID DIGITAL BINAURAL BTE  and V5011 - HEARING AID FITTING ORIENTATION      COUNSELING     Kayla Knight was counseled regarding realistic expectations and the role of the audiologist.    A price quote was signed and provided to patient. Patient was provided contact information for our financial counselor should Kayla Knight have any financial concerns or questions.    Policies:  1. A payment of at least half the total cost is expected on the day of the fitting. Payment plans can be arranged with our financial counselor.   2. A 30-day trial period is provided with the hearing aid(s). If hearing aids are returned during the 30-day trial period in good working order, the purchase price of the hearing aid(s) and/or accessories will be returned to you unless other account balances exist at [facility] and/or [facility]. If the hearing aid(s) are lost or damaged within the trial period, the hearing aid(s) cannot be returned.   a. The cost of the fitting fee(s) is non-refundable  b. The cost of earmold(s) is non-refundable      RECOMMENDATIONS   ??? Consider trial with amplification pending medical clearance and patient motivation   ??? Continue to monitor hearing annually  ??? Contact clinic when ready to order hearing aids     Robynn Pane Norrid, B.S.  Audiology Graduate Student Clinician    I was physically present and immediately available to direct and supervise tasks that  were related to patient management. The direction and supervision was continuous throughout the time these tasks were performed.    Care Provider(s) wore appropriate PPE throughout entire appointment (face mask and eye protection).  Patient's companion also wore face mask appropriately for the entire appointment (if applicable).  Patient did wear face mask appropriately for entirety of appointment.       Charges associated with today's visit:  ??? No Charge - Quantity: 3    Visit Time: 45 min    Para Skeans, AuD  Clinical Audiologist  [facility]  Scheduling: [phone]

## 2021-07-31 DIAGNOSIS — E1169 Type 2 diabetes mellitus with other specified complication: Principal | ICD-10-CM

## 2021-07-31 DIAGNOSIS — E785 Hyperlipidemia, unspecified: Principal | ICD-10-CM

## 2021-08-04 ENCOUNTER — Telehealth: Admit: 2021-08-04 | Discharge: 2021-08-05 | Payer: MEDICARE | Attending: Internal Medicine | Primary: Internal Medicine

## 2021-08-04 DIAGNOSIS — R194 Change in bowel habit: Principal | ICD-10-CM

## 2021-08-04 DIAGNOSIS — R195 Other fecal abnormalities: Principal | ICD-10-CM

## 2021-08-04 NOTE — Unmapped (Signed)
Contact Information     Person Contacted: Patient   Contact Phone number: [phone]   Phone Outcome: Contacted Patient/Caregiver Yes     Kayla Knight is 80 y.o.  participating in a real-time audio and video visit encounter.       Reason for Call    Chief Complaint   Patient presents with   ??? Shortness of Breath           Subjective:    Kayla Knight presents for evaluation of the following:    She notes increased frequency of bowel movements and change in odor.  She is having nocturnal bowel movements as well.  Every time she has to urinate she has a bowel movement (6-8 movements).  She denies nausea or vomiting, heartburn, dyspepsia.  Left side pain has stabilized.  Weight is up and down 159-165 lb.  She denies fever or chills.  She had URI symptoms about three weeks ago--nasal congestion and cough.  Testing for COVID-19 was negative.          I have reviewed the problem list, medications, and allergies and have updated/reconciled them if needed.     Physical Exam:    As part of this visit, no in-person exam was conducted.  If applicable, video visit findings are noted below:      She appears well, in no apparent distress.  Alert and oriented times three, pleasant and cooperative. Vital signs are as documented in vital signs section.       Assessment & Plan:     No diagnosis found.       Concerned about melena.  She is concerned about infection(s)--resaonable to test for giardia. FIT pero orders.  May need lower endoscopy.  Follow up pending results.        No orders of the defined types were placed in this encounter.         Signs and symptoms that should prompt further evaluation were reviewed with the patient.        The patient reports they are currently: at home. I spent 9:30 minutes on the real-time audio and video with the patient on the date of service. I spent an additional 3 minutes on pre- and post-visit activities on the date of service.     The patient was physically located in [place] or a state in which I am permitted to provide care. The patient and/or parent/guardian understood that s/he may incur co-pays and cost sharing, and agreed to the telemedicine visit. The visit was reasonable and appropriate under the circumstances given the patient's presentation at the time.    The patient and/or parent/guardian has been advised of the potential risks and limitations of this mode of treatment (including, but not limited to, the absence of in-person examination) and has agreed to be treated using telemedicine. The patient's/patient's family's questions regarding telemedicine have been answered.     If the visit was completed in an ambulatory setting, the patient and/or parent/guardian has also been advised to contact their provider???s office for worsening conditions, and seek emergency medical treatment and/or call 911 if the patient deems either necessary.

## 2021-08-06 ENCOUNTER — Ambulatory Visit: Admit: 2021-08-06 | Discharge: 2021-08-07 | Payer: MEDICARE

## 2021-08-18 NOTE — Unmapped (Signed)
[facility] Clinical Assessment & Refill Coordination Note    Kayla Knight, [place]: 28-Jul-1941  Phone: [phone] (home)     All above HIPAA information was verified with patient.     Was a Nurse, learning disability used for this call? No    Specialty Medication(s):   General Specialty: Repatha     Current Outpatient Medications   Medication Sig Dispense Refill   ??? amLODIPine (NORVASC) 5 MG tablet Take 1 tablet (5 mg total) by mouth daily. 90 tablet 3   ??? clopidogreL (PLAVIX) 75 mg tablet Take 1 tablet (75 mg total) by mouth in the morning. 30 tablet 11   ??? empty container (SHARPS-A-GATOR DISPOSAL SYSTEM) Misc Use as directed for sharps disposal 1 each 2   ??? evolocumab (REPATHA SURECLICK) 140 mg/mL PnIj Inject the contents of 1 pen (140 mg) under the skin every fourteen (14) days. 6 mL 3   ??? losartan-hydrochlorothiazide (HYZAAR) 100-25 mg per tablet TAKE 1 TABLET BY MOUTH DAILY. 90 tablet 3   ??? pantoprazole (PROTONIX) 40 MG tablet        No current facility-administered medications for this visit.        Changes to medications: Kayla Knight reports no changes at this time.    Allergies   Allergen Reactions   ??? Penicillins Itching and Swelling   ??? Lisinopril Cough   ??? Statins-Hmg-Coa Reductase Inhibitors      Ineffective at controlling cholesterol when tried in the past.   ??? Sulfa (Sulfonamide Antibiotics) Itching       Changes to allergies: No    SPECIALTY MEDICATION ADHERENCE     Repatha 140 mg/ml: 12 days of medicine on hand       Medication Adherence    Patient reported X missed doses in the last month: 0  Specialty Medication: Repatha 140 mg/mL  Informant: patient          Specialty medication(s) dose(s) confirmed: Regimen is correct and unchanged.     Are there any concerns with adherence? No. Kayla Knight is currently out of medication. Last injection was 11/19. Based on dates, she should still have 2 pens.    Adherence counseling provided? Kayla Knight reports she started using Google calendar for reminders of dosing. In looking back at her dates, the calendar reminders are not set up correctly and she was likely taking injections more frequently than q14 days. Going forward, we decided it was better that she does not use Google calendar. She will plan to take injections on 14th and 28th of each month to prevent confusion in the future.     CLINICAL MANAGEMENT AND INTERVENTION      Clinical Benefit Assessment:    Do you feel the medicine is effective or helping your condition? Yes    Clinical Benefit counseling provided? Not needed    Adverse Effects Assessment:    Are you experiencing any side effects? No    Are you experiencing difficulty administering your medicine? No    Quality of Life Assessment:          How many days over the past month did your hyperlipidemia  keep you from your normal activities? For example, brushing your teeth or getting up in the morning. 0    Have you discussed this with your provider? Not needed    Acute Infection Status:    Acute infections noted within Epic:  No active infections  Patient reported infection: None    Therapy Appropriateness:  Is therapy appropriate and patient progressing towards therapeutic goals? Yes, therapy is appropriate and should be continued    DISEASE/MEDICATION-SPECIFIC INFORMATION      For patients on injectable medications: Patient currently has 0 doses left.  Next injection is scheduled for 09/10/21.    PATIENT SPECIFIC NEEDS     - Does the patient have any physical, cognitive, or cultural barriers? No    - Is the patient high risk? No    - Does the patient require a Care Management Plan? No     - Does the patient require physician intervention or other additional services (i.e. nutrition, smoking cessation, social work)? No      SHIPPING     Specialty Medication(s) to be Shipped:   General Specialty: Repatha    Other medication(s) to be shipped: No additional medications requested for fill at this time     Changes to insurance: No    Delivery Scheduled: Yes, Expected medication delivery date: 09/08/21.     Medication will be delivered via Same Day Courier to the confirmed prescription address in [facility].    The patient will receive a drug information handout for each medication shipped and additional FDA Medication Guides as required.  Verified that patient has previously received a Conservation officer, historic buildings and a Surveyor, mining.    The patient or caregiver noted above participated in the development of this care plan and knows that they can request review of or adjustments to the care plan at any time.      All of the patient's questions and concerns have been addressed.    Camillo Flaming   [facility] [place] [facility] Specialty Pharmacist

## 2021-09-01 MED ORDER — PANTOPRAZOLE 40 MG TABLET,DELAYED RELEASE
ORAL_TABLET | 0 refills | 0 days
Start: 2021-09-01 — End: ?

## 2021-09-08 MED FILL — REPATHA SURECLICK 140 MG/ML SUBCUTANEOUS PEN INJECTOR: SUBCUTANEOUS | 84 days supply | Qty: 6 | Fill #2

## 2021-09-24 ENCOUNTER — Ambulatory Visit: Admit: 2021-09-24 | Discharge: 2021-09-25 | Payer: MEDICAID

## 2021-09-24 DIAGNOSIS — E1169 Type 2 diabetes mellitus with other specified complication: Principal | ICD-10-CM

## 2021-09-24 DIAGNOSIS — E785 Hyperlipidemia, unspecified: Principal | ICD-10-CM

## 2021-09-24 DIAGNOSIS — I10 Essential (primary) hypertension: Principal | ICD-10-CM

## 2021-09-24 LAB — ALBUMIN / CREATININE URINE RATIO
ALBUMIN QUANT URINE: 0.4 mg/dL
ALBUMIN/CREATININE RATIO: 2 ug/mg (ref 0.0–30.0)
CREATININE, URINE: 196.2 mg/dL

## 2021-09-24 LAB — BASIC METABOLIC PANEL
ANION GAP: 10 mmol/L
BLOOD UREA NITROGEN: 12 mg/dL (ref 5–26)
BUN / CREAT RATIO: 12 (ref 12–25)
CALCIUM: 9.2 mg/dL (ref 8.5–10.5)
CHLORIDE: 105 mmol/L (ref 95–110)
CO2: 28 mmol/L (ref 21.0–31.0)
CREATININE: 1.04 mg/dL (ref 0.50–1.50)
EGFR CKD-EPI (2021) FEMALE: 55 mL/min/{1.73_m2} — ABNORMAL LOW (ref >=60)
GLUCOSE RANDOM: 91 mg/dL (ref 60–100)
POTASSIUM: 4.5 mmol/L (ref 3.5–5.1)
SODIUM: 143 mmol/L (ref 136–145)

## 2021-09-24 LAB — CBC W/ AUTO DIFF
HEMATOCRIT: 41.2 % (ref 35.0–45.0)
HEMOGLOBIN: 13.2 g/dL (ref 11.3–15.5)
LYMPHOCYTES ABSOLUTE COUNT: 2.1 10*9/L (ref 1.0–4.8)
LYMPHOCYTES RELATIVE PERCENT: 31.8 %
MEAN CORPUSCULAR HEMOGLOBIN CONC: 32.1 g/dL (ref 31.0–36.0)
MEAN CORPUSCULAR HEMOGLOBIN: 24.4 pg — ABNORMAL LOW (ref 26.0–32.0)
MEAN CORPUSCULAR VOLUME: 76 fL — ABNORMAL LOW (ref 89.0–97.0)
MEAN PLATELET VOLUME: 6.3 fL — ABNORMAL LOW (ref 6.5–8.9)
MONOCYTES ABSOLUTE COUNT: 0.4 10*9/L (ref 0.0–0.8)
MONOCYTES RELATIVE PERCENT: 5.9 %
NEUTROPHILS ABSOLUTE COUNT: 4.4 10*9/L (ref 1.8–7.0)
NEUTROPHILS RELATIVE PERCENT: 62.3 %
PLATELET COUNT: 356 10*9/L (ref 140–440)
RED BLOOD CELL COUNT: 5.42 10*12/L — ABNORMAL HIGH (ref 3.80–5.10)
RED CELL DISTRIBUTION WIDTH: 15.2 % — ABNORMAL HIGH (ref 11.0–15.0)
WBC ADJUSTED: 6.9 10*9/L (ref 3.8–10.8)

## 2021-09-24 LAB — LIPID PANEL
CHOLESTEROL/HDL RATIO SCREEN: 2.9 (ref 0.0–4.5)
CHOLESTEROL: 145 mg/dL (ref 0–200)
HDL CHOLESTEROL: 50 mg/dL (ref 40–60)
LDL CHOLESTEROL CALCULATED: 75 mg/dL (ref 30–130)
NON-HDL CHOLESTEROL: 95 mg/dL
TRIGLYCERIDES: 100 mg/dL (ref <=150)
VLDL CHOLESTEROL CAL: 20 mg/dL — ABNORMAL LOW (ref 25–40)

## 2021-09-24 LAB — ALT: ALT (SGPT): 11 U/L (ref 10–49)

## 2021-09-24 LAB — AST: AST (SGOT): 30 U/L (ref 7–31)

## 2021-09-26 LAB — HEMOGLOBIN A1C
ESTIMATED AVERAGE GLUCOSE: 146 mg/dL
HEMOGLOBIN A1C: 6.7 % — ABNORMAL HIGH (ref 4.9–6.1)

## 2021-09-30 ENCOUNTER — Ambulatory Visit: Admit: 2021-09-30 | Discharge: 2021-10-01 | Payer: MEDICARE | Attending: Internal Medicine | Primary: Internal Medicine

## 2021-09-30 DIAGNOSIS — I10 Essential (primary) hypertension: Principal | ICD-10-CM

## 2021-09-30 DIAGNOSIS — Z0000 Encounter for general adult medical examination without abnormal findings: Principal | ICD-10-CM

## 2021-09-30 DIAGNOSIS — E785 Hyperlipidemia, unspecified: Principal | ICD-10-CM

## 2021-09-30 DIAGNOSIS — E1169 Type 2 diabetes mellitus with other specified complication: Principal | ICD-10-CM

## 2021-09-30 NOTE — Unmapped (Signed)
Assessment and Plan    1. Routine general medical examination at a health care facility    2. Essential hypertension, benign    3. 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  Lifestyle issues discussed ( including diet/sleep hygiene/exercise/stress management/sexual health/substance use as appropriate). Lab results were reviewed in detail with the patient. A  hard copy was offered to the patient.    Detailed vaccine counseling..  Aged out of cancer screenings.  BMD was OK 2018.      Discussed concerns relative to hypertension, history of renal artery stenosis, and rationale for medical therapies.  Recommend continuing clopidogrel until otherwise advised by cardiology.  She has follow up next month.  Follow up in six months or sooner prn.     Hemoglobin A1c is at goal.  Needs to exercise more. Reviewed HM in diabetes mellitus.  Eye exam is upcoming.          No problem-specific Assessment & Plan notes found for this encounter.    No orders of the defined types were placed in this encounter.    Requested Prescriptions      No prescriptions requested or ordered in this encounter       Indications, pharmacology, and potential adverse effects (to include significant interactions) were reviewed with the patient.      There are no discontinued medications.    No follow-ups on file.    Signs and symptoms that should prompt return sooner than scheduled were reviewed with the patient.      PCMH Components:     Barriers to goals identified and addressed. Pertinent handouts were given today and reviewed with the patient as indicated.  The Care Plan and Self-Management goals have been included on the AVS and the AVS has been printed. Any outside resources or referrals needed at this time are noted above. Patient's current medications have been reviewed. Any new medications prescribed have been discussed, and side effects have been addressed. Have assessed the patient's understanding, response, and barriers to adherence to medications. Patient voiced understanding and all questions have been answered to satisfaction.         HPI    Chief Complaint    Chief Complaint   Patient presents with   ??? Annual Exam       Kayla Knight presents for comprehensive wellness visit  and evaluation and management of the following concern(s):    She is not as physically active as before.     She stopped taking her medications except Repatha.    She has occasional left sided flank pain.      Vulvar itching has resolved.      She still has left medial arch pain.  The ris history of surgery.  Foot pain limits ability for prolonged standing and ability to exercise.       I have reviewed the patient's medical history in detail and updated the computerized patient record.    Patient Active Problem List   Diagnosis   ??? Essential hypertension, benign   ??? Primary osteoarthritis of right knee   ??? Idiopathic peripheral neuropathy   ??? Non-rheumatic tricuspid valve insufficiency   ??? MR (mitral regurgitation)   ??? Mixed stress and urge urinary incontinence   ??? 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 Neck pain   ??? Aortic valve disease   ??? Lipoma of torso   ??? Skin lesion   ??? Hearing  loss   ??? Lichen sclerosus et atrophicus of the vulva   ??? 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 Renal artery stenosis, native, bilateral (CMS-HCC)   ??? Hyperlipidemia   ??? Aftercare following joint replacement   ??? Presence of left artificial knee joint   ??? Hypokalemia   ??? History of chest pain   ??? Abnormal EKG   ??? Decreased GFR         Allergies:  Penicillins, Lisinopril, Statins-hmg-coa reductase inhibitors, and Sulfa (sulfonamide antibiotics)    Medications:   Outpatient Medications Prior to Visit   Medication Sig Dispense Refill   ??? amLODIPine (NORVASC) 5 MG tablet Take 1 tablet (5 mg total) by mouth daily. (Patient not taking: Reported on 09/30/2021) 90 tablet 3   ??? clopidogreL (PLAVIX) 75 mg tablet Take 1 tablet (75 mg total) by mouth in the morning. (Patient not taking: Reported on 09/30/2021) 30 tablet 11   ??? empty container (SHARPS-A-GATOR DISPOSAL SYSTEM) Misc Use as directed for sharps disposal (Patient not taking: Reported on 09/30/2021) 1 each 2   ??? evolocumab (REPATHA SURECLICK) 140 mg/mL PnIj Inject the contents of 1 pen (140 mg) under the skin every fourteen (14) days. (Patient not taking: Reported on 09/30/2021) 6 mL 3   ??? losartan-hydrochlorothiazide (HYZAAR) 100-25 mg per tablet TAKE 1 TABLET BY MOUTH DAILY. (Patient not taking: Reported on 09/30/2021) 90 tablet 3   ??? pantoprazole (PROTONIX) 40 MG tablet  (Patient not taking: Reported on 09/30/2021)       No facility-administered medications prior to visit.       Medical History:  Past Medical History:   Diagnosis Date   ??? Anemia     past history, over 40 yrs ago   ??? Arthritis     hand, knees, joints   ??? Asthma     past history, no recent attacks   ??? Asthma     patient stated due to asthmatic medication   ??? Cataract 2008    Surgery   ??? Diabetes mellitus (CMS-HCC)     Type II, on 9/15 patient stated she was not longer diabetic, not taking meds   ??? Dry eye    ??? Hearing impairment    ??? Heart murmur 2016    Dr. Verdie Shire @ [facility]   ??? Heart valve disease     Heart mumur, pt reports diagnosis 10 yrs ago (05/10/20)   ??? History of stent insertion of renal artery 01/29/2020    ASA indefinitely and plavix x 6 months per procedure note   ??? Hypertension     pt reports taking meds since about 1995   ??? Neuromuscular disorder (CMS-HCC) 2014    Feet & Legs   ??? Tear of meniscus of knee     4.5 yrs ago   ??? Urinary incontinence     seen a doctor about nighttime bathroom use (adressed in 2019)       Surgical History:  Past Surgical History:   Procedure Laterality Date   ??? CATARACT EXTRACTION W/ INTRAOCULAR LENS IMPLANT Left 02/27/2016   ??? EYE SURGERY  01/30/16    Cataract surgery is scheduled.   ??? JOINT REPLACEMENT  Knee   ??? KNEE CARTILAGE SURGERY Right 2007, 2008    x 2   ??? KNEE SURGERY      pt reports total left knee joint replacement surgery around 2016, 2017   ??? PLANTAR FASCIECTOMY Left    ??? PR  REVASCULARIZE FEM/POP ARTERY,ANGIOPLASTY/STENT N/A 01/29/2020    Procedure: Peripheral Angiography W Intervetion;  Surgeon: Alvira Philips, MD;  Location: [facility] C[facility];  Service: Cardiology   ??? PR UPPER GI ENDOSCOPY,BIOPSY N/A 04/01/2021    Procedure: UGI ENDOSCOPY; WITH BIOPSY, SINGLE OR MULTIPLE;  Surgeon: Jarvis Morgan, MD;  Location: [facility];  Service: Gastroenterology   ??? PR XCAPSL CTRC RMVL INSJ IO LENS PROSTH W/O ECP Right 02/13/2016    Procedure: EXTRACAPSULAR CATARACT REMOVAL W/INSERTION OF INTRAOCULAR LENS PROSTHESIS, MANUAL OR MECHANICAL TECHNIQUE;  Surgeon: Garey Ham, MD;  [facility];  Service: Ophthalmology   ??? PR XCAPSL CTRC RMVL INSJ IO LENS PROSTH W/O ECP Left 02/27/2016    Procedure: EXTRACAPSULAR CATARACT REMOVAL W/INSERTION OF INTRAOCULAR LENS PROSTHESIS, MANUAL OR MECHANICAL TECHNIQUE;  Surgeon: Garey Ham, MD;  Location: [facility] OR [facility];  Service: Ophthalmology   ??? RENAL ARTERY STENT  01/11/2020   ??? ROOT CANAL     ??? TUBAL LIGATION  [facility]    [facility]   ??? WISDOM TOOTH EXTRACTION         Social History:  Tobacco use:   reports that she quit smoking about 37 years ago. Her smoking use included cigarettes. She has never used smokeless tobacco.  Alcohol use:   reports that she does not currently use alcohol.  Drug use:  reports no history of drug use.      Family History:  Family History   Problem Relation Age of Onset   ??? Heart disease Mother    ??? Hypertension Mother    ??? Dementia Mother    ??? Arthritis Mother    ??? Heart disease Father    ??? Thyroid disease Father    ??? Glaucoma Father    ??? Alcohol abuse Father    ??? Heart attack Father 41   ??? Arthritis Father    ??? Hypertension Father         Massive heart attack   ??? Arthritis Sister    ??? Glaucoma Sister    ??? Hypertension Sister    ??? Diabetes Sister    ??? Diabetes Daughter    ??? Diabetes Paternal Aunt    ??? No Known Problems Brother    ??? No Known Problems Maternal Aunt    ??? No Known Problems Maternal Uncle    ??? No Known Problems Paternal Uncle    ??? No Known Problems Maternal Grandmother    ??? No Known Problems Maternal Grandfather    ??? No Known Problems Paternal Grandmother    ??? No Known Problems Paternal Grandfather    ??? Arthritis Sister         Noticeable in extremities   ??? COPD Sister    ??? Depression Sister    ??? Hypertension Sister    ??? Glaucoma Sister    ??? Asthma Daughter         Florencia Reasons   ??? Hypertension Daughter    ??? Miscarriages / [place] Daughter    ??? Cancer Maternal Aunt         Died more than 5yrs ago   ??? Early death Maternal Aunt    ??? Diabetes Paternal Aunt         4 Aunts   ??? Kidney disease Paternal Aunt    ??? Amblyopia Neg Hx    ??? Blindness Neg Hx    ??? Cancer Neg Hx    ??? Cataracts Neg Hx    ??? Macular  degeneration Neg Hx    ??? Retinal detachment Neg Hx    ??? Strabismus Neg Hx    ??? Stroke Neg Hx            Review of Systems:    In addition to the HPI, a comprehensive ROS was positive for the following: none.         Physical Exam    BP 150/80  - Pulse 60  - Temp 36.6 ??C (97.9 ??F) (Oral)  - Ht 163.8 cm (5' 4.5)  - Wt 72.8 kg (160 lb 8 oz)  - BMI 27.12 kg/m??     BP Readings from Last 3 Encounters:   09/30/21 150/80   07/07/21 167/73   06/17/21 140/82       Wt Readings from Last 3 Encounters:   09/30/21 72.8 kg (160 lb 8 oz)   07/07/21 74.8 kg (165 lb)   06/17/21 74.4 kg (164 lb)         General: alert,  is oriented, is not in distress.  HEENT: NCAT.  Normal sclerae. Mouth and throat are normal. Ears are normal. Nose is normal.    NECK: Supple. Enlarged thyroid is not present.  Masses are not present.    RESP: Normal respiratory effort.  Clear to auscultation. No wheezing or rhonchi..  CV: Regular rate and rhythm. Normal S1 and S2.  Extra heart sounds are not present.  Murmur(s) is absent.  Breasts: breasts appear normal, no suspicious masses, no skin or nipple changes or axillary nodes.   ABD:  Abdomen is soft, with normal  bowel sounds, without masses, without hepatosplenomegaly, without rebound or guarding, or without hernias.   LYMPH:  no cervical or supraclavicular LAD  MSK: Muscle tenderness is not present. Joint swelling, warmth, or tenderness are present,  .   SKIN: Appropriately warm and moist.   NEURO: stable gait and normal coordination. Cranial nerves 2-12 are grossly intact (taste was not assessed).  Strength is normal.  DTR's are normal.  EXT:  Edema is not present .  Capillary refill is normal .  Ulcer(s) are absent. Peripheral pulses are palpable.   PSYCH:  she is alert, is oriented to time, person and place. Normal thought content, speech, affect, mood and dress are noted.      Brief Diabetic foot exam:    Monofilament test 8 of 8 sites normal   Visual foot inspection - Skin:  cracked skin; Nails:  normal; Foot deformities:  nodule L medial arch        PHQ-2 Score:  PHQ-2 Total Score : 0

## 2021-11-03 ENCOUNTER — Ambulatory Visit: Admit: 2021-11-03 | Discharge: 2021-11-03 | Payer: MEDICARE

## 2021-11-03 DIAGNOSIS — E785 Hyperlipidemia, unspecified: Principal | ICD-10-CM

## 2021-11-03 DIAGNOSIS — R109 Unspecified abdominal pain: Principal | ICD-10-CM

## 2021-11-03 DIAGNOSIS — E1169 Type 2 diabetes mellitus with other specified complication: Principal | ICD-10-CM

## 2021-11-03 DIAGNOSIS — I701 Atherosclerosis of renal artery: Principal | ICD-10-CM

## 2021-11-03 DIAGNOSIS — I340 Nonrheumatic mitral (valve) insufficiency: Principal | ICD-10-CM

## 2021-11-03 DIAGNOSIS — E782 Mixed hyperlipidemia: Principal | ICD-10-CM

## 2021-11-03 DIAGNOSIS — I351 Nonrheumatic aortic (valve) insufficiency: Principal | ICD-10-CM

## 2021-11-03 DIAGNOSIS — I10 Essential (primary) hypertension: Principal | ICD-10-CM

## 2021-11-03 DIAGNOSIS — Z9114 Patient's other noncompliance with medication regimen: Principal | ICD-10-CM

## 2021-11-03 MED ORDER — AMLODIPINE 5 MG TABLET
ORAL_TABLET | Freq: Every day | ORAL | 3 refills | 90 days | Status: CP
Start: 2021-11-03 — End: ?

## 2021-11-03 NOTE — Unmapped (Signed)
DIVISION OF CARDIOLOGY  [facility] [place]        Date of Service: 11/03/2021      PCP: Referring Provider:   Dora Sims, MD  [address] [facility]  [place] [address]  Phone: [phone]  Fax: [phone] Rudene Anda, MD  [address]  [address] [place],  [place] [address]  Phone: [phone]  Fax: [phone]     ASSESSMENT & PLAN:   Kayla Knight is a 81 y.o. female w/PMHx of HTN, HLD and DM2 who presents for routine FU.    Hypertension- uncontrolled/ non-compliance; history of renal artery stenosis s/p stenting to the left renal artery (2021)  Patient reports SBP 150s- 160s at home.  She is not adherent to her medication regimen as prescribed and states that she is mostly only taking amlodipine 5 mg daily at this time.  She is describing left-sided flank pain and is extremely worried that this may be related to her prior renal artery stent.  Plan:  - Continue Plavix 75 mg daily.  - Continue Norvasc 5 mg daily and restart losartan/HCTZ 100/25 mg daily.  - Renal duplex to assess for artery stenosis.    Hyperlipidemia  Lab Results   Component Value Date    LDL 75 09/24/2021   Well controlled on Repatha.    Valvular heart disease  She was noted to have moderate aortic regurgitation and mild mitral regurgitation on last echocardiogram.  Plan:  - Repeat echocardiogram to reassess severity of MR and AR.    Type II DM  Lab Results   Component Value Date    A1C 6.7 (H) 09/24/2021   Not on therapies.      Return in about 6 months (around 05/03/2022) for Recheck.        SUBJECTIVE:        Reason for Consultation: HTN    History of Present Illness: Kayla Knight is a 81 y.o. female w/PMHx of uncontrolled hypertension, history of renal artery stenosis status post stenting to the left renal artery, hyperlipidemia, type 2 diabetes and medication nonadherence.  She comes in today for routine follow-up.    Kayla Knight has a history of longstanding hypertension previously well controlled on maxzide, but requiring increasing medciations in 2020-2021. Additionally had admission for HTN urgency in 10/2019.  Renal artery duplex 01-15-20 revealed bilateral renal artery stenosis, for which he underwent left renal artery stenting on 01-29-20; angiography showed a 60% right renal artery stenosis so no intervention was completed.  Significant bruising on ASA, hence she stopped taking it, however she continues to be on Plavix.     Patient reports SBP 150s- 160s at home.  She is not adherent to her medication regimen as prescribed and states that she is mostly only taking amlodipine 5 mg daily at this time.  She is describing left-sided flank pain and is extremely worried that this may be related to her prior renal artery stent.    She denies complaints of chest pain, dyspnea, exertional dyspnea, fatigue, palpitations, orthopnea, PND.      Cardiovascular History:  Moderate mitral regurgitation and moderate aortic regurgitation  Hypertension (20 years)  History of anemia in her 75s    I have independently reviewed of all the diagnostic studies. I have reviewed the old medical records from [facility] prior to this clinic visit.     Cardiovascular Studies Date Comments     ECG  NSR with  1st deg AVB, +PACs   Echo 10/20/2019 1. The left ventricle is normal in size with mildly to moderately increased  wall thickness.  ????2. The left ventricular systolic function is normal, LVEF is visually  estimated at > 55%.  ????3. There is moderate mitral valve regurgitation.  ????4. The aortic valve is trileaflet with mildly thickened leaflets with normal  excursion.  ????5. There is mild aortic regurgitation.  ????6. The left atrium is mildly dilated in size.  ????7. The right ventricle is normal in size, with normal systolic function.   Stress test     Cardiac catheterization     [id number] Genotype     Rhythm Monitoring     Cardiac CT/MRI     Electrophysiology      Cardiovascular Surgery Peripheral Vascular Studies Renal Angiogram 1. Bilateral renal artery stenosis including heavily calcified serial 80% lesions in the left renal artery and 60% right renal artery stenosis at a bifurcation  2. Successful intervention on left renal artery with placement of an Onyx drug eluting stent     I have personally reviewed the recent imaging studies on this patient.    Past medical history:  Patient Active Problem List   Diagnosis   ??? Essential hypertension, benign   ??? Primary osteoarthritis of right knee   ??? Idiopathic peripheral neuropathy   ??? MR (mitral regurgitation)   ??? Mixed stress and urge urinary incontinence   ??? 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 Neck pain   ??? Lipoma of torso   ??? Skin lesion   ??? Hearing loss   ??? Lichen sclerosus et atrophicus of the vulva   ??? 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 Renal artery stenosis, native, bilateral (CMS-HCC)   ??? Hyperlipidemia   ??? Aftercare following joint replacement   ??? Presence of left artificial knee joint   ??? Hypokalemia   ??? History of chest pain   ??? Decreased GFR   ??? Nonrheumatic aortic valve insufficiency       Past surgical history:  Past Surgical History:   Procedure Laterality Date   ??? CATARACT EXTRACTION W/ INTRAOCULAR LENS IMPLANT Left 02/27/2016   ??? EYE SURGERY  01/30/16    Cataract surgery is scheduled.   ??? JOINT REPLACEMENT  Knee   ??? KNEE CARTILAGE SURGERY Right 2007, 2008    x 2   ??? KNEE SURGERY      pt reports total left knee joint replacement surgery around 2016, 2017   ??? PLANTAR FASCIECTOMY Left    ??? PR REVASCULARIZE FEM/POP ARTERY,ANGIOPLASTY/STENT N/A 01/29/2020    Procedure: Peripheral Angiography W Intervetion;  Surgeon: Alvira Philips, MD;  Location: [facility] CATH;  Service: Cardiology   ??? PR UPPER GI ENDOSCOPY,BIOPSY N/A 04/01/2021    Procedure: UGI ENDOSCOPY; WITH BIOPSY, SINGLE OR MULTIPLE;  Surgeon: Jarvis Morgan, MD;  [facility]: [facility];  Service: Gastroenterology   ??? PR XCAPSL CTRC RMVL INSJ IO LENS PROSTH W/O ECP Right 02/13/2016    Procedure: EXTRACAPSULAR CATARACT REMOVAL W/INSERTION OF INTRAOCULAR LENS PROSTHESIS, MANUAL OR MECHANICAL TECHNIQUE;  Surgeon: Garey Ham, MD;  Location: [facility];  Service: Ophthalmology   ??? PR XCAPSL CTRC RMVL INSJ IO LENS PROSTH W/O ECP Left 02/27/2016    Procedure: EXTRACAPSULAR CATARACT REMOVAL W/INSERTION OF INTRAOCULAR LENS PROSTHESIS, MANUAL OR MECHANICAL TECHNIQUE;  Surgeon: Garey Ham, MD;  [facility] OR [facility];  Service: Ophthalmology   ???  RENAL ARTERY STENT  01/11/2020   ??? ROOT CANAL     ??? TUBAL LIGATION  [facility]    [facility]   ??? WISDOM TOOTH EXTRACTION         Medications:   Patient's Medications   New Prescriptions    No medications on file   Previous Medications    CLOPIDOGREL (PLAVIX) 75 MG TABLET    Take 1 tablet (75 mg total) by mouth in the morning.    EMPTY CONTAINER (SHARPS-A-GATOR DISPOSAL SYSTEM) MISC    Use as directed for sharps disposal    EVOLOCUMAB (REPATHA SURECLICK) 140 MG/ML PNIJ    Inject the contents of 1 pen (140 mg) under the skin every fourteen (14) days.    LOSARTAN-HYDROCHLOROTHIAZIDE (HYZAAR) 100-25 MG PER TABLET    TAKE 1 TABLET BY MOUTH DAILY.    PANTOPRAZOLE (PROTONIX) 40 MG TABLET       Modified Medications    Modified Medication Previous Medication    AMLODIPINE (NORVASC) 5 MG TABLET amLODIPine (NORVASC) 5 MG tablet       Take 1 tablet (5 mg total) by mouth daily.    Take 1 tablet (5 mg total) by mouth daily.   Discontinued Medications    No medications on file       Allergies:  Allergies   Allergen Reactions   ??? Penicillins Itching and Swelling   ??? Lisinopril Cough   ??? Statins-Hmg-Coa Reductase Inhibitors      Ineffective at controlling cholesterol when tried in the past.   ??? Sulfa (Sulfonamide Antibiotics) Itching       Social History:  She  reports that she quit smoking about 37 years ago. Her smoking use included cigarettes. She has never used smokeless tobacco. She reports that she does not currently use alcohol. She reports that she does not use drugs.    Family History:  Her family history includes Alcohol abuse in her father; Arthritis in her father, mother, sister, and sister; Asthma in her daughter; COPD in her sister; Cancer in her maternal aunt; Dementia in her mother; Depression in her sister; Diabetes in her daughter, paternal aunt, paternal aunt, and sister; Early death in her maternal aunt; Glaucoma in her father, sister, and sister; Heart attack (age of onset: 74) in her father; Heart disease in her father and mother; Hypertension in her daughter, father, mother, sister, and sister; Kidney disease in her paternal aunt; Miscarriages / Stillbirths in her daughter; No Known Problems in her brother, maternal aunt, maternal grandfather, maternal grandmother, maternal uncle, paternal grandfather, paternal grandmother, and paternal uncle; Thyroid disease in her father.    Review of Systems  10 systems were reviewed and negative except as noted in HPI.      OBJECTIVE:       Physical Exam  BP 168/71  - Pulse 63  - Ht 163.1 cm (5' 4.2)  - Wt 73.4 kg (161 lb 12.8 oz)  - SpO2 98%  - BMI 27.60 kg/m??    Wt Readings from Last 3 Encounters:   11/03/21 73.4 kg (161 lb 12.8 oz)   09/30/21 72.8 kg (160 lb 8 oz)   07/07/21 74.8 kg (165 lb)       General:  Alert, no distress.   Eyes:  Intact, sclerae anicteric.   ENT: Moist mucous membranes. Supple. No obvious thyromegaly.   Respiratory:   CTAB bilaterally with normal WOB.   Cardiovascular:  No carotid bruit, JVD normal at 90 degrees, 4/6 decrescendo murmur heard throughout  the precordium, RRR, no rubs or gallops  No edema bilaterally. Pulses full and equal throughout   Gastrointestinal:   nondistented   Musculoskeletal: Normal bulk   Skin: Warm, well perfused.   Neurologic: No focal deficits.       Most recent labs   Lab Results   Component Value Date    Sodium 143 09/24/2021    Sodium 137 04/24/2019 Potassium 4.5 09/24/2021    Potassium 3.7 04/24/2019    Chloride 105 09/24/2021    Chloride 103 04/24/2019    CO2 28.0 09/24/2021    CO2 30.3 04/24/2019    BUN 12 09/24/2021    BUN 12.00 04/24/2019    Creatinine 1.04 09/24/2021    Creatinine 0.8 04/24/2019    Magnesium 2.0 11/13/2019     Lab Results   Component Value Date    HGB 13.2 09/24/2021    HGB 12.7 10/20/2018    MCV 76.0 (L) 09/24/2021    MCV 77 (L) 10/20/2018    Platelet 356 09/24/2021    Platelet 343 10/20/2018     Lab Results   Component Value Date    Cholesterol 145 09/24/2021    Cholesterol, Total 248 (H) 10/20/2018    Triglycerides 100 09/24/2021    Triglycerides 121 10/20/2018    HDL 50 09/24/2021    HDL 49 10/20/2018    Non-HDL Cholesterol 95 09/24/2021    LDL calculated 174.8 (H) 10/20/2018    LDL Calculated 75 09/24/2021    Hemoglobin A1C 6.7 (H) 09/24/2021    Hemoglobin A1C 6.0 04/24/2019    TSH 1.040 04/02/2021    TSH 0.88 10/06/2016    PRO-BNP 88.2 02/17/2020    INR 0.91 08/13/2018

## 2021-11-19 ENCOUNTER — Ambulatory Visit: Admit: 2021-11-19 | Discharge: 2021-11-20 | Payer: MEDICARE

## 2021-11-26 NOTE — Unmapped (Signed)
[facility] Refill Coordination Note    Specialty Medication(s) to be Shipped:   General Specialty: Repatha    Other medication(s) to be shipped: No additional medications requested for fill at this time     Kayla Knight, DOB: 1941/04/30  Phone: [phone] (home)       All above HIPAA information was verified with patient.     Was a Nurse, learning disability used for this call? No    Completed refill call assessment today to schedule patient's medication shipment from the [facility] [phone]).  All relevant notes have been reviewed.     Specialty medication(s) and dose(s) confirmed: Regimen is correct and unchanged.   Changes to medications: Shaqueena reports no changes at this time.  Changes to insurance: No  New side effects reported not previously addressed with a pharmacist or physician: None reported  Questions for the pharmacist: No    Confirmed patient received a Conservation officer, historic buildings and a Surveyor, mining with first shipment. The patient will receive a drug information handout for each medication shipped and additional FDA Medication Guides as required.       DISEASE/MEDICATION-SPECIFIC INFORMATION        For patients on injectable medications: Patient currently has 0 doses left.  Next injection is scheduled for 12/09/21.    SPECIALTY MEDICATION ADHERENCE     Medication Adherence    Patient reported X missed doses in the last month: 0  Specialty Medication: Repatha 140 mg/mL  Informant: patient          Were doses missed due to medication being on hold? No    Repatha 140 mg/ml: 13 days of medicine on hand     REFERRAL TO PHARMACIST     Referral to the pharmacist: Not needed      [facility]     Shipping address confirmed in Epic.     Delivery Scheduled: Yes, Expected medication delivery date: 12/03/21.     Medication will be delivered via Same Day Courier to the prescription address in Epic WAM.    Camillo Flaming   [facility] Specialty Pharmacist

## 2021-11-27 DIAGNOSIS — E782 Mixed hyperlipidemia: Principal | ICD-10-CM

## 2021-11-28 DIAGNOSIS — I701 Atherosclerosis of renal artery: Principal | ICD-10-CM

## 2021-12-03 MED FILL — REPATHA SURECLICK 140 MG/ML SUBCUTANEOUS PEN INJECTOR: SUBCUTANEOUS | 84 days supply | Qty: 6 | Fill #3

## 2021-12-03 NOTE — Unmapped (Signed)
Unable to reach patient, and voice mail had not been returned. I called her daughter today, and she was able to 3 way call with her mother. Kayla Knight stated, frankly I've been pretty irritated because I hadn't heard from anyone and my follow up appointment was set for August. Explained to patient that Dr. Laurice Record and I had both attempted to call her but had to leave voice mail. She stated, my phone is set to only receive calls from my contacts, and I don't know how to change it. It's best to communicate to me through mychart. She also reported some pain from her kidneys and that it hurts to lay on her side. She was agreeable to schedule the procedure but asked for Dr. Laurice Record to reach out via mychart. Dr. Laurice Record notified.

## 2021-12-08 ENCOUNTER — Institutional Professional Consult (permissible substitution): Admit: 2021-12-08 | Discharge: 2021-12-09 | Payer: MEDICARE

## 2021-12-08 NOTE — Unmapped (Signed)
DIVISION OF CARDIOLOGY  [facility], [place]        Date of Service: 12/08/2021      PCP: Referring Provider:   Dora Sims, MD  [address] [facility]  [place] [address]  Phone: [phone]  Fax: [phone] Kayla Evener, MD  [address] [facility]  [place],  [place] [address]  Phone: [phone]  Fax: [phone]     ASSESSMENT & PLAN:     ## L sided flank pain  Recent renal ultrasound shows normal left renal size and patent stent in the left renal artery.  It is still unclear what is causing her left-sided flank pain though this appears to be positional in nature suggesting a possible musculoskeletal etiology.  I recommended that she set up a follow-up appointment with her PCP for further evaluation of her left-sided flank pain.  Ideally it would be good to get this evaluated prior to her scheduled renal angiogram.    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  However, she is not adherent to her medication regimen as prescribed and states that she is mostly only taking amlodipine 5 mg daily at this time.  She is describing left-sided flank pain and is extremely worried that this may be related to her prior renal artery stent.  We proceeded to do a renal duplex which showed a patent left renal artery stent but a greater than 60% stenosis in the right renal artery.  Plan:  - Continue Plavix 75 mg daily.  - Continue Norvasc 5 mg daily and restart losartan/HCTZ 100/25 mg daily.  - Repeat renal angiogram to evaluate for right renal artery stenosis and potential angioplasty.    Hyperlipidemia  Lab Results   Component Value Date    LDL 75 09/24/2021   Well controlled on Repatha.    Valvular heart disease  She was noted to have moderate aortic regurgitation and mild mitral regurgitation on last echocardiogram.  Plan:  - Repeat echocardiogram to reassess severity of MR and AR at next visit.    Type II DM  Lab Results   Component Value Date    A1C 6.7 (H) 09/24/2021   Not on therapies.    Return in about 3 months (around 03/10/2022) for Recheck.        SUBJECTIVE:        Reason for Consultation: HTN    History of Present Illness: Kayla Knight is a 81 y.o. female w/PMHx of uncontrolled hypertension, history of renal artery stenosis status post stenting to the left renal artery, hyperlipidemia, type 2 diabetes and medication nonadherence.  She is contacted for a scheduled telephone encounter.    Her biggest concern continues to be her left-sided flank pain.  Recent renal ultrasound shows normal left renal size and patent stent in the left renal artery.  It is still unclear what is causing her left-sided flank pain though this appears to be positional in nature suggesting a possible musculoskeletal etiology.  However she is noting some dark urine along with the left-sided flank pain too.    Patient reports SBP 150s- 160s at home despite trial of medications.  However, she is not adherent to her medication regimen as prescribed and states that she is mostly only taking amlodipine 5 mg daily at this  time.  She is describing left-sided flank pain and is extremely worried that this may be related to her prior renal artery stent.  We proceeded to do a renal duplex which showed a patent left renal artery stent but a greater than 60% stenosis in the right renal artery.      Initial history:  [name] has a history of longstanding hypertension previously well controlled on maxzide, but requiring increasing medciations in 2020-2021. Additionally had admission for HTN urgency in 10/2019.  Renal artery duplex 01-15-20 revealed bilateral renal artery stenosis, for which he underwent left renal artery stenting on 01-29-20; angiography showed a 60% right renal artery stenosis so no intervention was completed.  Significant bruising on ASA, hence she stopped taking it, however she continues to be on Plavix.     She denies complaints of chest pain, dyspnea, exertional dyspnea, fatigue, palpitations, orthopnea, PND.      Cardiovascular History:  Moderate mitral regurgitation and moderate aortic regurgitation  Hypertension (20 years)  History of anemia in her 9s    I have independently reviewed of all the diagnostic studies. I have reviewed the old medical records from [facility] prior to this clinic visit.     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 The left ventricle is normal in size with mildly to moderately increased  wall thickness.  ????2. The left ventricular systolic function is normal, LVEF is visually  estimated at > 55%.  ????3. There is moderate mitral valve regurgitation.  ????4. The aortic valve is trileaflet with mildly thickened leaflets with normal  excursion.  ????5. There is mild aortic regurgitation.  ????6. The left atrium is mildly dilated in size.  ????7. The right ventricle is normal in size, with normal systolic function.   Stress test     Cardiac catheterization     [id number] Genotype     Rhythm Monitoring     Cardiac CT/MRI     Electrophysiology      Cardiovascular Surgery     Renal duplex 11/19/2021 Right:  Evidence of a greater than 60% stenosis of the right renal artery. Kidney size and RI are WNL. There is evidence of flow in the right renal vein.  Left:   No evidence of renal artery stenosis. Kidney size and RI are WNL. There is evidence of flow in the left renal vein.  Patent stent.   Peripheral Vascular Studies Renal Angiogram 1. Bilateral renal artery stenosis including heavily calcified serial 80% lesions in the left renal artery and 60% right renal artery stenosis at a bifurcation  2. Successful intervention on left renal artery with placement of an Onyx drug eluting stent     I have personally reviewed the recent imaging studies on this patient.    Past medical history:  Patient Active Problem List   Diagnosis   ??? Essential hypertension, benign   ??? Primary osteoarthritis of right knee   ??? Idiopathic peripheral neuropathy   ??? MR (mitral regurgitation)   ??? Mixed stress and urge urinary incontinence   ??? 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 Neck pain   ??? Lipoma of torso   ??? Skin lesion   ??? Hearing loss   ??? Lichen sclerosus et atrophicus of the vulva   ??? 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 Renal artery stenosis, native, bilateral (CMS-HCC)   ??? Hyperlipidemia   ???  Aftercare following joint replacement   ??? Presence of left artificial knee joint   ??? Hypokalemia   ??? History of chest pain   ??? Decreased GFR   ??? Nonrheumatic aortic valve insufficiency   ??? Non compliance w medication regimen       Past surgical history:  Past Surgical History:   Procedure Laterality Date   ??? CATARACT EXTRACTION W/ INTRAOCULAR LENS IMPLANT Left 02/27/2016   ??? EYE SURGERY  01/30/16    Cataract surgery is scheduled.   ??? JOINT REPLACEMENT  Knee   ??? KNEE CARTILAGE SURGERY Right 2007, 2008    x 2   ??? KNEE SURGERY      pt reports total left knee joint replacement surgery around 2016, 2017   ??? PLANTAR FASCIECTOMY Left    ??? PR REVASCULARIZE FEM/POP ARTERY,ANGIOPLASTY/STENT N/A 01/29/2020    Procedure: Peripheral Angiography W Intervetion;  Surgeon: Alvira Philips, MD;  Location[facility] C[facility];  Service: Cardiology   ??? PR UPPER GI ENDOSCOPY,BIOPSY N/A 04/01/2021    Procedure: UGI ENDOSCOPY; WITH BIOPSY, SINGLE OR MULTIPLE;  Surgeon: Jarvis Morgan, MD;  Location: [facility] PROCEDURES [facility]  Service: Gastroenterology   ??? PR XCAPSL CTRC RMVL INSJ IO LENS PROSTH W/O ECP Right 02/13/2016    Procedure: EXTRACAPSULAR CATARACT REMOVAL W/INSERTION OF INTRAOCULAR LENS PROSTHESIS, MANUAL OR MECHANICAL TECHNIQUE;  Surgeon: Garey Ham, MD;  Location: [facility] OR [facility];  Service: Ophthalmology   ??? PR XCAPSL CTRC RMVL INSJ IO LENS PROSTH W/O ECP Left 02/27/2016    Procedure: EXTRACAPSULAR CATARACT REMOVAL W/INSERTION OF INTRAOCULAR LENS PROSTHESIS, MANUAL OR MECHANICAL TECHNIQUE;  Surgeon: Garey Ham, MD;  Location: [facility] OR [facility];  Service: Ophthalmology   ??? RENAL ARTERY STENT  01/11/2020   ??? ROOT CANAL     ??? TUBAL LIGATION  1973    [facility]   ??? WISDOM TOOTH EXTRACTION         Medications:   Patient's Medications   New Prescriptions    No medications on file   Previous Medications    AMLODIPINE (NORVASC) 5 MG TABLET    Take 1 tablet (5 mg total) by mouth daily.    CLOPIDOGREL (PLAVIX) 75 MG TABLET    Take 1 tablet (75 mg total) by mouth in the morning.    EMPTY CONTAINER (SHARPS-A-GATOR DISPOSAL SYSTEM) MISC    Use as directed for sharps disposal    EVOLOCUMAB (REPATHA SURECLICK) 140 MG/ML PNIJ    Inject the contents of 1 pen (140 mg) under the skin every fourteen (14) days.    LOSARTAN-HYDROCHLOROTHIAZIDE (HYZAAR) 100-25 MG PER TABLET    TAKE 1 TABLET BY MOUTH DAILY.    PANTOPRAZOLE (PROTONIX) 40 MG TABLET       Modified Medications    No medications on file   Discontinued Medications    No medications on file       Allergies:  Allergies   Allergen Reactions   ??? Penicillins Itching and Swelling   ??? Lisinopril Cough   ??? Statins-Hmg-Coa Reductase Inhibitors      Ineffective at controlling cholesterol when tried in the past.   ??? Sulfa (Sulfonamide Antibiotics) Itching       Social History:  She  reports that she quit smoking about 37 years ago. Her smoking use included cigarettes. She has never used smokeless tobacco. She reports that she does not currently use alcohol. She reports that she does not use drugs.    Family History:  Her family  history includes Alcohol abuse in her father; Arthritis in her father, mother, sister, and sister; Asthma in her daughter; COPD in her sister; Cancer in her maternal aunt; Dementia in her mother; Depression in her sister; Diabetes in her daughter, paternal aunt, paternal aunt, and sister; Early death in her maternal aunt; Glaucoma in her father, sister, and sister; Heart attack (age of onset: 48) in her father; Heart disease in her father and mother; Hypertension in her daughter, father, mother, sister, and sister; Kidney disease in her paternal aunt; Miscarriages / Stillbirths in her daughter; No Known Problems in her brother, maternal aunt, maternal grandfather, maternal grandmother, maternal uncle, paternal grandfather, paternal grandmother, and paternal uncle; Thyroid disease in her father.    Review of Systems  10 systems were reviewed and negative except as noted in HPI.      OBJECTIVE:       Physical Exam  There were no vitals taken for this visit.   Wt Readings from Last 3 Encounters:   11/03/21 73.4 kg (161 lb 12.8 oz)   09/30/21 72.8 kg (160 lb 8 oz)   07/07/21 74.8 kg (165 lb)     Exam deferred since this was a telephone visit.    Most recent labs   Lab Results   Component Value Date    Sodium 143 09/24/2021    Sodium 137 04/24/2019    Potassium 4.5 09/24/2021    Potassium 3.7 04/24/2019    Chloride 105 09/24/2021    Chloride 103 04/24/2019    CO2 28.0 09/24/2021    CO2 30.3 04/24/2019    BUN 12 09/24/2021    BUN 12.00 04/24/2019    Creatinine 1.04 09/24/2021    Creatinine 0.8 04/24/2019    Magnesium 2.0 11/13/2019     Lab Results   Component Value Date    HGB 13.2 09/24/2021    HGB 12.7 10/20/2018    MCV 76.0 (L) 09/24/2021    MCV 77 (L) 10/20/2018    Platelet 356 09/24/2021    Platelet 343 10/20/2018     Lab Results   Component Value Date    Cholesterol 145 09/24/2021    Cholesterol, Total 248 (H) 10/20/2018    Triglycerides 100 09/24/2021    Triglycerides 121 10/20/2018    HDL 50 09/24/2021    HDL 49 10/20/2018    Non-HDL Cholesterol 95 09/24/2021    LDL calculated 174.8 (H) 10/20/2018    LDL Calculated 75 09/24/2021    Hemoglobin A1C 6.7 (H) 09/24/2021    Hemoglobin A1C 6.0 04/24/2019    TSH 1.040 04/02/2021    TSH 0.88 10/06/2016    PRO-BNP 88.2 02/17/2020    INR 0.91 08/13/2018           The patient reports they are currently: at home. I spent 15 minutes on the phone with the patient on the date of service. I spent an additional 30 minutes on pre- and post-visit activities on the date of service.     The patient was physically located in [place] or a state in which I am permitted to provide care. The patient and/or parent/guardian understood that s/he may incur co-pays and cost sharing, and agreed to the telemedicine visit. The visit was reasonable and appropriate under the circumstances given the patient's presentation at the time.    The patient and/or parent/guardian has been advised of the potential risks and limitations of this mode of treatment (including, but not limited to, the absence of in-person examination) and has  agreed to be treated using telemedicine. The patient's/patient's family's questions regarding telemedicine have been answered.     If the visit was completed in an ambulatory setting, the patient and/or parent/guardian has also been advised to contact their provider???s office for worsening conditions, and seek emergency medical treatment and/or call 911 if the patient deems either necessary.

## 2021-12-09 ENCOUNTER — Ambulatory Visit: Admit: 2021-12-09 | Discharge: 2021-12-10 | Payer: MEDICARE | Attending: Internal Medicine | Primary: Internal Medicine

## 2021-12-09 DIAGNOSIS — R109 Unspecified abdominal pain: Principal | ICD-10-CM

## 2021-12-09 LAB — CBC W/ AUTO DIFF
HEMATOCRIT: 44 % (ref 35.0–45.0)
HEMOGLOBIN: 13.5 g/dL (ref 11.3–15.5)
LYMPHOCYTES ABSOLUTE COUNT: 2.8 10*9/L (ref 1.0–4.8)
LYMPHOCYTES RELATIVE PERCENT: 34.8 %
MEAN CORPUSCULAR HEMOGLOBIN CONC: 30.7 g/dL — ABNORMAL LOW (ref 31.0–36.0)
MEAN CORPUSCULAR HEMOGLOBIN: 23.6 pg — ABNORMAL LOW (ref 26.0–32.0)
MEAN CORPUSCULAR VOLUME: 77 fL — ABNORMAL LOW (ref 89.0–97.0)
MEAN PLATELET VOLUME: 7.5 fL (ref 6.5–8.9)
MONOCYTES ABSOLUTE COUNT: 0.4 10*9/L (ref 0.0–0.8)
MONOCYTES RELATIVE PERCENT: 5.7 %
NEUTROPHILS ABSOLUTE COUNT: 5 10*9/L (ref 1.8–7.0)
NEUTROPHILS RELATIVE PERCENT: 59.5 %
PLATELET COUNT: 397 10*9/L (ref 140–440)
RED BLOOD CELL COUNT: 5.73 10*12/L — ABNORMAL HIGH (ref 3.80–5.10)
RED CELL DISTRIBUTION WIDTH: 16.9 % — ABNORMAL HIGH (ref 11.0–15.0)
WBC ADJUSTED: 8.2 10*9/L (ref 3.8–10.8)

## 2021-12-09 LAB — URINALYSIS WITH MICROSCOPY
BILIRUBIN UA: NEGATIVE
GLUCOSE UA: NEGATIVE
KETONES UA: NEGATIVE
NITRITE UA: NEGATIVE
PH UA: 6 (ref 5.0–8.0)
RBC UA: 2 /HPF (ref 0–3)
SPECIFIC GRAVITY UA: 1.02 (ref 1.002–1.030)
SQUAMOUS EPITHELIAL: 4 /HPF (ref 0–5)
UROBILINOGEN UA: 0.2
WBC UA: 25 /HPF — ABNORMAL HIGH (ref 0–3)

## 2021-12-09 LAB — COMPREHENSIVE METABOLIC PANEL
ALBUMIN: 4.1 g/dL (ref 3.4–5.0)
ALKALINE PHOSPHATASE: 92 U/L (ref 46–116)
ALT (SGPT): 10 U/L (ref 10–49)
ANION GAP: 10 mmol/L (ref 5–14)
AST (SGOT): 19 U/L (ref <=34)
BILIRUBIN TOTAL: 0.4 mg/dL (ref 0.3–1.2)
BLOOD UREA NITROGEN: 18 mg/dL (ref 9–23)
BUN / CREAT RATIO: 19
CALCIUM: 9.7 mg/dL (ref 8.7–10.4)
CHLORIDE: 104 mmol/L (ref 98–107)
CO2: 27 mmol/L (ref 20.0–31.0)
CREATININE: 0.97 mg/dL — ABNORMAL HIGH
EGFR CKD-EPI (2021) FEMALE: 60 mL/min/{1.73_m2} (ref >=60)
GLUCOSE RANDOM: 89 mg/dL (ref 70–179)
POTASSIUM: 4.2 mmol/L (ref 3.4–4.8)
PROTEIN TOTAL: 8 g/dL (ref 5.7–8.2)
SODIUM: 141 mmol/L (ref 135–145)

## 2021-12-09 NOTE — Unmapped (Signed)
Assessment and Plan      1. Left flank pain        Likely musculoskeletal but it is prudent to check for evidence of UTI or urinary tract stone. Labs per orders. Follow up pending results.    We discussed concerns relative to uncontrolled hypertension and rationale for optimizing medical therapy, whether or not she elects to proceed with renal artery PCI.   She declines adjustment to antihypertensive(s) today. Follow up as scheduled with appropriate provider or sooner prn.      No problem-specific Assessment & Plan notes found for this encounter.      Orders Placed This Encounter   Procedures    Urine Culture    Comprehensive metabolic panel    Urinalysis with Microscopy    CBC w/ Differential       Requested Prescriptions      No prescriptions requested or ordered in this encounter       Medications Discontinued During This Encounter   Medication Reason    losartan-hydrochlorothiazide (HYZAAR) 100-25 mg per tablet No Longer Taking       No follow-ups on file.    Signs and symptoms that should prompt return sooner than scheduled were reviewed with the patient.      [facility] Components:     Medication adherence and barriers to the treatment plan have been addressed. Opportunities to optimize healthy behaviors have been discussed. Patient / caregiver voiced understanding.        HPI      Chief Complaint    Chief Complaint   Patient presents with    Back Pain     Left side       Kayla Knight presents for evaluation and management of the following concern(s):    Left flank pain continues.  Her blood pressure is elevated despite use of teas.  She is taking amlopdine and clopidogrel only.  She stopped taking losartan-hydrochlorothiazide because it wasn't doing anything.  Pain is intermittent.  Urine is darker.  Pain is associated with reaching or when she missteps.  She denies falls.        Health Maintenance reviewed - no intervention today      I have reviewed the patient's medical history in detail and updated the computerized patient record.        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 90 tablet 3    clopidogreL (PLAVIX) 75 mg tablet Take 1 tablet (75 mg total) by mouth in the morning. 30 tablet 11    evolocumab (REPATHA SURECLICK) 140 mg/mL PnIj Inject the contents of 1 pen (140 mg) under the skin every fourteen (14) days. 6 mL 3    pantoprazole (PROTONIX) 40 MG tablet       empty container (SHARPS-A-GATOR DISPOSAL SYSTEM) Misc Use as directed for sharps disposal 1 each 2    losartan-hydrochlorothiazide (HYZAAR) 100-25 mg per tablet TAKE 1 TABLET BY MOUTH DAILY. 90 tablet 3     No facility-administered medications prior to visit.       Medical History:  Past Medical History:   Diagnosis Date    Anemia     past history, over 40 yrs ago    Arthritis     hand, knees, joints    Asthma     past history, no recent attacks    Asthma     patient stated due to asthmatic medication    Cataract 2008    Surgery    Diabetes mellitus (CMS-HCC) Type II, on 9/15 patient stated she was not longer diabetic, not taking meds    Dry eye     Hearing impairment     Heart murmur 2016    [facility] Verdie [facility]    Heart valve disease     Heart mumur, pt reports diagnosis 10 yrs ago (05/10/20)    History of stent insertion of renal artery 01/29/2020    ASA indefinitely and plavix x 6 months per procedure note    Hypertension     pt reports taking meds since about 1995    Neuromuscular disorder (CMS-HCC) 2014    Feet & Legs    Tear of meniscus of knee     4.5 yrs ago    Urinary incontinence     seen a doctor about nighttime bathroom use (adressed in 2019)       Surgical History:  Past Surgical History:   Procedure Laterality Date    CATARACT EXTRACTION W/ INTRAOCULAR LENS IMPLANT Left 02/27/2016    EYE SURGERY  01/30/16    Cataract surgery is scheduled.    JOINT REPLACEMENT  Knee    KNEE CARTILAGE SURGERY Right 2007, 2008    x 2    KNEE SURGERY      pt reports total left knee joint replacement surgery around 2016, 2017    PLANTAR FASCIECTOMY Left     PR REVASCULARIZE FEM/POP ARTERY,ANGIOPLASTY/STENT N/A 01/29/2020    Procedure: Peripheral Angiography W Intervetion;  Surgeon: Alvira Philips, MD;  Location: [facility] CATH;  Service: Cardiology    PR UPPER GI ENDOSCOPY,BIOPSY N/A 04/01/2021    Procedure: UGI ENDOSCOPY; WITH BIOPSY, SINGLE OR MULTIPLE;  Surgeon: Jarvis Morgan, MD;  Location: [facility];  Service: Gastroenterology    PR XCAPSL CTRC RMVL INSJ IO LENS PROSTH W/O ECP Right 02/13/2016    Procedure: EXTRACAPSULAR CATARACT REMOVAL W/INSERTION OF INTRAOCULAR LENS PROSTHESIS, MANUAL OR MECHANICAL TECHNIQUE;  Surgeon: Garey Ham, MD;  Location: [facility] OR [facility];  Service: Ophthalmology    PR XCAPSL CTRC RMVL INSJ IO LENS PROSTH W/O ECP Left 02/27/2016    Procedure: EXTRACAPSULAR CATARACT REMOVAL W/INSERTION OF INTRAOCULAR LENS PROSTHESIS, MANUAL OR MECHANICAL TECHNIQUE;  Surgeon: Garey Ham, MD;  Location: [facility] OR [facility];  Service: Ophthalmology    RENAL ARTERY STENT  01/11/2020    ROOT CANAL      TUBAL LIGATION  1973    [facility]    WISDOM TOOTH EXTRACTION         Social History:  Tobacco use:   reports that she quit smoking about 37 years ago. Her smoking use included cigarettes. She has never used smokeless tobacco.  Alcohol use:   reports that she does not currently use alcohol.  Drug use:  reports no history of drug use.      Family History:  Family History   Problem Relation Age of Onset    Heart disease Mother     Hypertension Mother     Dementia Mother     Arthritis Mother     Heart disease Father     Thyroid disease Father     Glaucoma Father     Alcohol abuse Father     Heart attack Father 63    Arthritis Father     Hypertension Father         Massive heart attack    Arthritis Sister     Glaucoma Sister     Hypertension Sister     Diabetes Sister     Diabetes Daughter     Diabetes Paternal Aunt     No Known Problems Brother     No Known Problems Maternal Aunt     No Known Problems Maternal Uncle     No Known Problems Paternal Uncle     No Known Problems Maternal Grandmother     No Known Problems Maternal Grandfather     No Known Problems Paternal Grandmother     No Known Problems Paternal Grandfather     Arthritis Sister         Noticeable in extremities    COPD Sister     Depression Sister     Hypertension Sister     Glaucoma Sister     Asthma Daughter         Ocassional    Hypertension Daughter     Miscarriages / Stillbirths Daughter     Cancer Maternal Aunt         Died more than 67yrs ago    Early death Maternal Aunt     Diabetes Paternal Aunt         4 Aunts    Kidney disease Paternal Aunt     Amblyopia Neg Hx     Blindness Neg Hx     Cancer Neg Hx     Cataracts Neg Hx     Macular degeneration Neg Hx     Retinal detachment Neg Hx     Strabismus Neg Hx     Stroke Neg Hx            Review of Systems:    Pertinent review of systems is noted in the HPI.           Physical Exam      BP 160/70  - Pulse 64  - Temp 36.9 ??C (98.5 ??F) (Oral)  - Wt 72.1 kg (159 lb)  - BMI 27.12 kg/m??       BP Readings from Last 3 Encounters:   12/09/21 160/70   11/03/21 168/71   09/30/21 150/80       Wt Readings from Last 3 Encounters:   12/09/21 72.1 kg (159 lb)   11/03/21 73.4 kg (161 lb 12.8 oz)   09/30/21 72.8 kg (160 lb 8 oz)  She appears well, in no apparent distress.  Alert and oriented times three, pleasant and cooperative. Vital signs are as documented in vital signs section.   Mucous membranes are moist. Cor is regular rate and rhythm with normal S1 and S2.  Lungs are clear throughout with normal respiratory effort.   No lower extremity edema.  Abdomen is soft with normal bowel sounds.  No rebound or guarding, costovertebral angle tenderness, or hepatosplenomegaly are noted.

## 2021-12-09 NOTE — Unmapped (Signed)
Patient called wanting to know if she should come and give a urine sample before he 3:20 appt today.  If so please call patient and let her know.

## 2021-12-22 DIAGNOSIS — R109 Unspecified abdominal pain: Principal | ICD-10-CM

## 2021-12-22 NOTE — Unmapped (Signed)
Received message from patient stating she is having L side pain. She is asking stent that was placed could be causing the pain. Pain is not constant. Pain level depends on how she moves or positioned. She is having a stent placed on Friday and is concerned. Attempted to call patient. Left message to return call. Also, sent message through My Chart. Please advise.

## 2021-12-22 NOTE — Unmapped (Signed)
Positional pain- L flank pain.  Sounds MSK in nature.  Not related to prior renal artery stent.

## 2021-12-22 NOTE — Unmapped (Signed)
Patient called because she was seen on 3/14 for pain in her side. She said she pain has not gone away and has been increasing since. She is wondering if a Mri or CT might be necessary at this point. She is suppose to be having a stent place in her kidney on Friday and she is feeling hesitant to have to procedure done with the continued pain she is having.    Please advise    Thank you

## 2021-12-23 NOTE — Unmapped (Signed)
Received call from patient regarding left pain. Spoke with MD this morning and is sure it is musculoskeletal pain

## 2021-12-26 ENCOUNTER — Ambulatory Visit: Admit: 2021-12-26 | Discharge: 2021-12-26 | Payer: MEDICARE

## 2021-12-26 LAB — BASIC METABOLIC PANEL
ANION GAP: 10 mmol/L (ref 5–14)
BLOOD UREA NITROGEN: 17 mg/dL (ref 9–23)
BUN / CREAT RATIO: 18
CALCIUM: 9 mg/dL (ref 8.7–10.4)
CHLORIDE: 109 mmol/L — ABNORMAL HIGH (ref 98–107)
CO2: 24 mmol/L (ref 20.0–31.0)
CREATININE: 0.96 mg/dL — ABNORMAL HIGH
EGFR CKD-EPI (2021) FEMALE: 60 mL/min/{1.73_m2} (ref >=60)
GLUCOSE RANDOM: 101 mg/dL — ABNORMAL HIGH (ref 70–99)
POTASSIUM: 3.7 mmol/L (ref 3.4–4.8)
SODIUM: 143 mmol/L (ref 135–145)

## 2021-12-26 MED ORDER — ASPIRIN 81 MG TABLET,DELAYED RELEASE
ORAL_TABLET | Freq: Every day | ORAL | 11 refills | 30 days | Status: CP
Start: 2021-12-26 — End: ?

## 2021-12-26 MED ADMIN — lidocaine (PF) (XYLOCAINE-MPF) 20 mg/mL (2 %) injection: SUBCUTANEOUS | @ 14:00:00 | Stop: 2021-12-26

## 2021-12-26 MED ADMIN — iohexoL (OMNIPAQUE) 350 mg iodine/mL solution: @ 15:00:00 | Stop: 2021-12-26

## 2021-12-26 MED ADMIN — iohexoL (OMNIPAQUE) 350 mg iodine/mL solution: INTRA_ARTERIAL | @ 15:00:00 | Stop: 2021-12-26

## 2021-12-26 MED ADMIN — fentaNYL (PF) (SUBLIMAZE) injection: INTRAVENOUS | @ 14:00:00 | Stop: 2021-12-26

## 2021-12-26 MED ADMIN — fentaNYL (PF) (SUBLIMAZE) injection: INTRAVENOUS | @ 15:00:00 | Stop: 2021-12-26

## 2021-12-26 MED ADMIN — aspirin tablet 325 mg: 325 mg | ORAL | @ 13:00:00 | Stop: 2021-12-26

## 2021-12-26 MED ADMIN — midazolam (VERSED) injection: INTRAVENOUS | @ 14:00:00 | Stop: 2021-12-26

## 2021-12-26 MED ADMIN — heparin (porcine) in NS Manifold Flush: @ 14:00:00 | Stop: 2021-12-26

## 2021-12-26 MED ADMIN — midazolam (VERSED) injection: INTRAVENOUS | @ 15:00:00 | Stop: 2021-12-26

## 2021-12-26 MED ADMIN — hydrALAZINE (APRESOLINE) injection 10 mg: 10 mg | INTRAVENOUS | @ 18:00:00 | Stop: 2021-12-26

## 2021-12-26 MED ADMIN — heparin (porcine) 1000 unit/mL injection: INTRAVENOUS | @ 14:00:00 | Stop: 2021-12-26

## 2021-12-26 MED ADMIN — clopidogreL (PLAVIX) tablet: ORAL | @ 15:00:00 | Stop: 2021-12-26

## 2021-12-26 NOTE — Unmapped (Signed)
Cardiac Catheterization Laboratory  [facility], [place]  Tel: [phone]     Fax: [phone]     PRELIMINARY CARDIAC CATHETERIZATION REPORT  (Full Report to Follow within 72 hours)  ____________________________________________________________________________     Findings:  1. Patent stent in the left renal artery with 40% instent restenosis with no pressure gradient  2. 50% stenosis in the main branch of the right renal artery (pressure gradient was 2 - 3 mm Hg mean and 6 mm Hg systolic), 50% ostial stenosis of the inferior branch of the right renal artery (pressure gradient was 2 mm Hg mean and 5 mm Hg systolic) and 70% mid stenosis in the inferior branch of the right renal artery (pressure gradient was 6 mm Hg mean and 13 mm Hg systolic)   3. Successful PTI of mid-stenosis in the inferior branch of the right renal artery with placement of an Onyx drug eluting stent    Recommendations:  ?? Renal intervention performed  ?? Aggressive secondary prevention  ?? ASA 81 mg daily indefinitely  ?? Clopidogrel 75 mg daily for at least 1 month.  ?? CYP2C19 Genotyping. Plavix genotyping was ordered to determine whether the patient could be safely and effectively treated with Plavix.  If Plavix was not used initially, it is because the patient is at high risk for thrombotic events and, until the genotyping results are available, it is unknown whether the patient will metabolize Plavix to the active drug. Clinical studies have shown that patients who are not responders to Plavix (as identified by CYP2C19 genotyping) have a higher incidence of death, myocardial infarction and stroke when treated with Plavix (Cardiovascular Revascularization Medicine 41 (2022): 115-121).  The Plavix Genotype results will be used to determine whether the patient can be safely and effectively treated with Plavix - this is clinically useful information as Plavix is associated with lower costs and less bleeding risk than the alternatives.      ____________________________________________________________________________                  Date of Cardiac Procedure: 12/26/2021    Pre-op Diagnosis: renal stenosis    Post-op Diagnosis: same       Performing Service: Cardiology  Surgeon(s) and Role:     * Alvira Philips, MD - Primary     * Corinne Ports, MD - Fellow - Interventional    Procedures performed:  Bilateral selective renal angiography  Bilateral hemodynamic assessment of renal arteries  Right renal artery stent      Arterial Access Site:   Right Femoral Artery      ____________________________________________________________________________                No specimens were taken  Estimated Blood Loss: Minimal     I was present during the entire procedure      Willene Hatchet MD  Date: 12/26/2021  Time: 11:07 AM

## 2021-12-26 NOTE — Unmapped (Signed)
Cardiac Catheterization Laboratory  [place], [place]  Tel: [phone]     Fax: [phone]       HISTORY & PHYSICAL ASSESSMENT    PCP:  Dora Sims, MD  Phone:  [phone]  Fax:  [phone]    Referring Physicians:  Alvira Philips, Md  [address]  [id number] [place]  [place],  [place] [address]     Primary Cardiologist:  Dr. 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 The patient or the patient's representative has been advised that selected tasks may be performed by assistants to the primary health care provider(s). I believe that the patient (or person authorized to sign for the patient) understands what has been explained, and has consented to the operation or procedure.  _____________________________________________________________________    HISTORY: This is a 81 yo F w/PMHx of HTN c/w b/l renal artery stenosis s/p stent to left renal artery, referred by Dr. Laurice Record for renal artery intervention to right.    Pt underwent successul stent to LRA in 2021, however, her BP remains high after procedure, recent doppler suggested mid RRA had 60-99% stenosis. CUrrent meds amlodipine 5, Repatha, plavix 75 mg.She reported to have some L flank pain, but reasoned more to MSK. Otherwise denied to have any CP or dyspnea.       Renal PVL 11/19/21    Right: Evidence of a 60-99% stenosis in the mid RRA with PST noted distally and an abnormal RAR. Kidney size and resistive index are within normal limits. Doppler signal demonstrated presence of flow in the right renal vein.  Left: No evidence of hemodynamically significant stenosis detected in the renal artery. Kidney size and resistive index are within normal limits. Doppler signal demonstrated presence of flow in the left renal vein. The LRA stent appeared to be patent.  Her BP last visit 160/70 mmHg.     Medications Administered : (pre-procedure)  Aspirin    Medications Contraindicated :   PCN, Statin, Lisinopril.     Visit Vitals  Ht 170.2 cm (5' 7)   Wt 72.1 kg (159 lb)   BMI 24.90 kg/m??     General: Alert, NAD, sitting up in bed  HEENT: Sclera anicteric, MMM, no LAD  Cardiac: normal rate, regular rhythm, no murmurs rubs or gallops. No JVD  Pulmonary: CTAB, no increased WOB  Abdomen: Soft, non-tender, non distended. NABS  Extremities: No LE edema  Neuro: AAOx4. No focal deficits     Labs and imaging were reviewed    Hb 13.5 plt 397 Cr 0.97 12/09/21    The patient's estimated bleeding risk is  .     Strategies used to mitigate risk include:  RFA access    Teaching Physician Note  I saw the patient with the Resident. I discussed the findings, assessment and plan with the Resident and agree with the findings and plan as documented in the Resident's note.    Willene Hatchet MD

## 2021-12-26 NOTE — Unmapped (Signed)
CARDIOLOGY PROCEDURE PROGRESS NOTE:   Admit Date: December 26, 2021         Attending: Alvira Philips, *    Primary Service: Cards Procedures (MDS)    Admitting Diagnosis:  Renal artery stenosis, native, bilateral (CMS-HCC) [I70.1]    Patient seen and evaluated post procedure.  I agree with H&P as recorded.    Right femoral arterial puncture site(s) with dressing clean, dry and intact. No bleeding, hematoma or bruit noted.    Procedure:  12/26/2021 R renal artery stent placement. See cath report for details.         ASSESSMENT AND PLAN      ASSESSMENT AND PLAN:    Kayla Knight is a 81 y.o.-year-old female s/p R renal artery stent placement.    * Monitor on telemetry  * Vital signs per protocol.   * Monitor femoral arterial access site(s) for bleeding or hematoma.  * Continue clopidogrel 75mg  daily and start ASA 81mg  daily. Continue repatha.   * Up ad lib after bedrest complete.  * Maintain IV/Medlock per protocol.  * Check 12 lead EKG post procedure         Lab Results   Component Value Date    WBC 8.2 12/09/2021    HGB 13.5 12/09/2021    HCT 44.0 12/09/2021    PLT 397 12/09/2021       Lab Results   Component Value Date    NA 143 12/26/2021    K 3.7 12/26/2021    CL 109 (H) 12/26/2021    CO2 24.0 12/26/2021    BUN 17 12/26/2021    CREATININE 0.96 (H) 12/26/2021    GLU 101 (H) 12/26/2021    CALCIUM 9.0 12/26/2021    MG 2.0 11/13/2019       Lab Results   Component Value Date    INR 0.91 08/13/2018    APTT 34.1 08/13/2018       Lab Results   Component Value Date    LDL 75 09/24/2021       Lab Results   Component Value Date    A1C 6.7 (H) 09/24/2021      I was the supervising physician in the delivery of the service.     Greggory Stallion A. Jaymes Graff MD

## 2021-12-26 NOTE — Unmapped (Signed)
Report from Roman,RN. Pt is awake and talking, appropriate. Lying flat on bedrest with RFA sheath in place. Denies pain or other complaints. Reminded to not bend her R leg, verbalized understanding.

## 2021-12-27 NOTE — Unmapped (Signed)
D/C teaching reviewed with pt and family member, both verbalized understanding of teaching. Questions invited and answered.Dressing to access site CDI. Pt OOB to get dressed and ambulate without difficulty. PIV removed. Pt taken to d/c lobby via wheelchair by transporter. Pt instructed not to drive for 24 hours, reports ride will be picking them up at discharge lobby.

## 2021-12-29 DIAGNOSIS — R109 Unspecified abdominal pain: Principal | ICD-10-CM

## 2022-01-05 ENCOUNTER — Ambulatory Visit: Admit: 2022-01-05 | Discharge: 2022-01-05 | Payer: MEDICAID

## 2022-01-23 ENCOUNTER — Ambulatory Visit: Admit: 2022-01-23 | Discharge: 2022-01-24 | Payer: MEDICAID

## 2022-01-23 MED ADMIN — gadobenate dimeglumine (MULTIHANCE) 529 mg/mL (0.1mmol/0.2mL) solution 7 mL: 7 mL | INTRAVENOUS | @ 19:00:00 | Stop: 2022-01-23

## 2022-01-26 ENCOUNTER — Ambulatory Visit: Admit: 2022-01-26 | Discharge: 2022-01-27 | Payer: MEDICAID | Attending: Internal Medicine | Primary: Internal Medicine

## 2022-01-26 DIAGNOSIS — I10 Essential (primary) hypertension: Principal | ICD-10-CM

## 2022-01-26 DIAGNOSIS — E1169 Type 2 diabetes mellitus with other specified complication: Principal | ICD-10-CM

## 2022-01-26 DIAGNOSIS — I701 Atherosclerosis of renal artery: Principal | ICD-10-CM

## 2022-01-26 DIAGNOSIS — I340 Nonrheumatic mitral (valve) insufficiency: Principal | ICD-10-CM

## 2022-01-26 DIAGNOSIS — E785 Hyperlipidemia, unspecified: Principal | ICD-10-CM

## 2022-01-26 MED ORDER — PANTOPRAZOLE 40 MG TABLET,DELAYED RELEASE
ORAL_TABLET | 0 refills | 0 days
Start: 2022-01-26 — End: ?

## 2022-01-26 MED ORDER — AMLODIPINE 10 MG TABLET
ORAL_TABLET | Freq: Every day | ORAL | 3 refills | 90 days | Status: CP
Start: 2022-01-26 — End: 2023-01-26

## 2022-01-26 MED ORDER — SPIRONOLACTONE 50 MG TABLET
ORAL_TABLET | Freq: Every day | ORAL | 3 refills | 90 days | Status: CP
Start: 2022-01-26 — End: 2023-01-26

## 2022-01-26 NOTE — Unmapped (Signed)
It was a pleasure seeing you in clinic today. Please contact us via MyChart or by phone with any questions or concerns.  If imaging has been ordered, please call [facility] Imaging Department at [phone] to schedule your appointment.     CONTACT:  Clinic Phone: [phone]  Clinic Fax: [phone]    Clinic Visit Summary:  You were seen by Dr. Paulino Rily and Dr. Laurice Record.  We are increasing your blood pressure regimen and referring you for Sleep Study

## 2022-01-26 NOTE — Unmapped (Signed)
DIVISION OF CARDIOLOGY  [facility] [place]        Date of Service: 01/26/2022      PCP: Referring Provider:   Dora Sims, MD  [address] [facility]  [place] [address]  Phone: [phone]  Fax: [phone] Virgina Evener, MD  [address] [place] [facility]  [place],  [place] [address]  Phone: [phone]  Fax: [phone]     ASSESSMENT & PLAN:   Kayla Knight is a 81 y.o. female w/PMHx of HTN, HLD and DM2 who presents for routine follow up.    Hypertension- uncontrolled/ non-compliance  Resistant HTN  history of renal artery stenosis s/p stenting to the left renal artery (2021)  Blood pressures are poorly controlled. Previously, she has been seeing BPs of 150-160s and is now seeing BPs at 190s while she has been off losartan/hydrochlorothiazide. She stopped taking losartan/hydrochlorothiazide due to concerns that it was not working- BPs were not controlled on 3 drug regimen suggesting resistant HTN. Suspect she may have some degree of secondary HTN.   Plan:  - Continue Plavix 75 mg daily, asa 81 - can transition to monotherapy on follow up visit  - Will increase Norvasc to 10 mg daily and start spironolactone 50mg  daily. If blood pressures remain elevated at 2 week follow up, (>140/90) will restart losartan/HCTZ 100/25 mg daily.  - Sleep study to evaluate for secondary causes of HTN    Hyperlipidemia  Lab Results   Component Value Date    LDL 75 09/24/2021   Well controlled on Repatha.    Valvular heart disease  She was noted to have moderate aortic regurgitation and mild mitral regurgitation on last echocardiogram.  Plan:  - 12/2021 TTE showing mild to mod AI and mild MR; stable from prior. Continue with q25yr follow up.    Type II DM  Lab Results   Component Value Date    A1C 6.7 (H) 09/24/2021   Not on therapies. Diet controlled.      Return in about 3 months (around 04/28/2022) for Next scheduled follow up w/Dr. Laurice Record.      SUBJECTIVE:        Reason for Consultation: HTN    History of Present Illness: Kayla Knight is a 81 y.o. female w/PMHx of uncontrolled hypertension, history of renal artery stenosis status post stenting to the left renal artery, hyperlipidemia, type 2 diabetes and medication nonadherence.  She comes in today for routine follow-up.    [name] has a history of longstanding hypertension previously well controlled on maxzide, but requiring increasing medications in 2020-2021. Additionally had admission for HTN urgency in 10/2019.  Renal artery duplex 01-15-20 revealed bilateral renal artery stenosis, for which he underwent left renal artery stenting on 01-29-20; angiography showed a 60% right renal artery stenosis so no intervention was completed.      She was concerned regarding pain in her flank and was referred for renal artery duplex that did show potential progression in stenosis. She was sent for cath and had stenting of the inferior branch of the right renal artery with good result. She did not see any change in her symptoms following this and has been seeing blood pressures of 190s frequently. She self-discontinued losartan-hydrochlorothiazide and has been resistant in the past to further titration.    She denies complaints of chest pain, dyspnea, exertional dyspnea, fatigue, palpitations, orthopnea, PND.  Cardiovascular History:  Mitral regurgitation and moderate aortic regurgitation  Hypertension (20 years)  History of anemia in her 30s    I have independently reviewed of all the diagnostic studies. I have reviewed the old medical records from [facility] prior to this clinic visit.     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 The left ventricle is normal in size with mildly to moderately increased  wall thickness.    2. The left ventricular systolic function is normal, LVEF is visually estimated at > 55%.    3. There is moderate mitral valve regurgitation.    4. The aortic valve is trileaflet with mildly thickened leaflets with normal excursion.    5. There is mild aortic regurgitation.    6. The left atrium is mildly dilated in size.    7. The right ventricle is normal in size, with normal systolic function.   Stress test     Cardiac catheterization     CYP2C19 Genotype     Rhythm Monitoring     Cardiac CT/MRI     Electrophysiology      Cardiovascular Surgery     Peripheral Vascular Studies Renal Angiogram                                      Renal Angiogram   Patent stent in the left renal artery with 40% instent restenosis with no pressure gradient across the lesion  Diffuse right renal artery atherosclerotic disease including a 50% stenosis in the main branch of the right renal artery (pressure gradient was 2 - 3 mm Hg mean and 6 mm Hg systolic), a 50% ostial stenosis of the inferior branch of the right renal artery (pressure gradient was 2 mm Hg mean and 5 mm Hg systolic) and a 70% stenosis in the mid portion of the inferior branch of the right renal artery (pressure gradient was 6 mm Hg mean and 13 mm Hg systolic)   Successful PTI of mid-stenosis in the inferior branch of the right renal artery with placement of an Onyx drug eluting stent      Bilateral renal artery stenosis including heavily calcified serial 80% lesions in the left renal artery and 60% right renal artery stenosis at a bifurcation  Successful intervention on left renal artery with placement of an Onyx drug eluting stent     I have personally reviewed the recent imaging studies on this patient.    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    JOINT REPLACEMENT  Knee    KNEE CARTILAGE SURGERY Right 2007, 2008    x 2    KNEE SURGERY      pt reports total left knee joint replacement surgery around 2016, 2017    PLANTAR FASCIECTOMY Left     PR REVASCULARIZE FEM/POP ARTERY,ANGIOPLASTY/STENT N/A 01/29/2020    Procedure: Peripheral Angiography W Intervetion;  Surgeon: Alvira Philips, MD;  Location: [facility] CATH;  Service: Cardiology    PR REVASCULARIZE FEM/POP ARTERY,ANGIOPLASTY/STENT Right 12/26/2021    Procedure: right renal artery intervention;  Surgeon: Alvira Philips, MD;  Location: [facility] CATH;  Service: Cardiology    PR UPPER GI ENDOSCOPY,BIOPSY N/A 04/01/2021    Procedure: UGI ENDOSCOPY; WITH BIOPSY, SINGLE OR MULTIPLE;  Surgeon: Jarvis Morgan, MD;  Location: [facility] PROCEDURES [facility];  Service: Gastroenterology    PR XCAPSL CTRC RMVL INSJ IO LENS PROSTH W/O ECP Right 02/13/2016    Procedure: EXTRACAPSULAR CATARACT REMOVAL W/INSERTION OF INTRAOCULAR LENS PROSTHESIS, MANUAL OR MECHANICAL TECHNIQUE;  Surgeon: Garey Ham, MD;  [facility];  Service: Ophthalmology    PR XCAPSL CTRC RMVL INSJ IO LENS PROSTH W/O ECP Left 02/27/2016    Procedure: EXTRACAPSULAR CATARACT REMOVAL W/INSERTION OF INTRAOCULAR LENS PROSTHESIS, MANUAL OR MECHANICAL TECHNIQUE;  Surgeon: Garey Ham, MD;  Location: [facility] OR [facility]; Service: Ophthalmology    RENAL ARTERY STENT  01/11/2020    ROOT CANAL      TUBAL LIGATION  1973    [facility]    WISDOM TOOTH EXTRACTION         Medications:   Patient's Medications   New Prescriptions    No medications on file   Previous Medications    AMLODIPINE (NORVASC) 5 MG TABLET    Take 1 tablet (5 mg total) by mouth daily.    ASPIRIN (ECOTRIN) 81 MG TABLET    Take 1 tablet (81 mg total) by mouth daily.    CLOPIDOGREL (PLAVIX) 75 MG TABLET    Take 1 tablet (75 mg total) by mouth in the morning.    EMPTY CONTAINER (SHARPS-A-GATOR DISPOSAL SYSTEM) MISC    Use as directed for sharps disposal    EVOLOCUMAB (REPATHA SURECLICK) 140 MG/ML PNIJ    Inject the contents of 1 pen (140 mg) under the skin every fourteen (14) days.    PANTOPRAZOLE (PROTONIX) 40 MG TABLET       Modified Medications    No medications on file   Discontinued Medications    No medications on file       Allergies:  Allergies   Allergen Reactions    Penicillins Itching and Swelling    Lisinopril Cough    Statins-Hmg-Coa Reductase Inhibitors      Ineffective at controlling cholesterol when tried in the past.    Sulfa (Sulfonamide Antibiotics) Itching       Social History:  She  reports that she quit smoking about 37 years ago. Her smoking use included cigarettes. She has never used smokeless tobacco. She reports that she does not currently use alcohol. She reports that she does not use drugs.    Family History:  Her family history includes Alcohol abuse in her father; Arthritis in her  father, mother, sister, and sister; Asthma in her daughter; COPD in her sister; Cancer in her maternal aunt; Dementia in her mother; Depression in her sister; Diabetes in her daughter, paternal aunt, paternal aunt, and sister; Early death in her maternal aunt; Glaucoma in her father, sister, and sister; Heart attack (age of onset: 58) in her father; Heart disease in her father and mother; Hypertension in her daughter, father, mother, sister, and sister; Kidney disease in her paternal aunt; Miscarriages / Stillbirths in her daughter; No Known Problems in her brother, maternal aunt, maternal grandfather, maternal grandmother, maternal uncle, paternal grandfather, paternal grandmother, and paternal uncle; Thyroid disease in her father.    Review of Systems  10 systems were reviewed and negative except as noted in HPI.      OBJECTIVE:       Physical Exam  There were no vitals taken for this visit.   Wt Readings from Last 3 Encounters:   12/26/21 72.1 kg (159 lb)   12/09/21 72.1 kg (159 lb)   11/03/21 73.4 kg (161 lb 12.8 oz)       General:  Alert, no distress.   Eyes:  Intact, sclerae anicteric.   ENT: Moist mucous membranes. Supple. No obvious thyromegaly.   Respiratory:   CTAB bilaterally with normal WOB.   Cardiovascular:  No carotid bruit, JVD normal at 90 degrees,  4/6 decrescendo murmur heard throughout the precordium, RRR, no rubs or gallops   No edema bilaterally. Pulses full and equal throughout   Gastrointestinal:   nondistented   Musculoskeletal: Normal bulk   Skin: Warm, well perfused.   Neurologic: No focal deficits.       Most recent labs   Lab Results   Component Value Date    Sodium 143 12/26/2021    Sodium 137 04/24/2019    Potassium 3.7 12/26/2021    Potassium 3.7 04/24/2019    Chloride 109 (H) 12/26/2021    Chloride 103 04/24/2019    CO2 24.0 12/26/2021    CO2 30.3 04/24/2019    BUN 17 12/26/2021    BUN 12.00 04/24/2019    Creatinine 0.96 (H) 12/26/2021    Creatinine 0.8 04/24/2019    Magnesium 2.0 11/13/2019     Lab Results   Component Value Date    HGB 13.5 12/09/2021    HGB 12.7 10/20/2018    MCV 77.0 (L) 12/09/2021    MCV 77 (L) 10/20/2018    Platelet 397 12/09/2021    Platelet 343 10/20/2018     Lab Results   Component Value Date    Cholesterol 145 09/24/2021    Cholesterol, Total 248 (H) 10/20/2018    Triglycerides 100 09/24/2021    Triglycerides 121 10/20/2018    HDL 50 09/24/2021    HDL 49 10/20/2018    Non-HDL Cholesterol 95 09/24/2021    LDL calculated 174.8 (H) 10/20/2018    LDL Calculated 75 09/24/2021    Hemoglobin A1C 6.7 (H) 09/24/2021    Hemoglobin A1C 6.0 04/24/2019    TSH 1.040 04/02/2021    TSH 0.88 10/06/2016    PRO-BNP 88.2 02/17/2020    INR 0.91 08/13/2018     - Deitra Mayo, MD MPH  Cardiovascular Medicine Fellow, PGY-5

## 2022-01-27 MED ORDER — PANTOPRAZOLE 40 MG TABLET,DELAYED RELEASE
ORAL_TABLET | 0 refills | 0 days
Start: 2022-01-27 — End: ?

## 2022-01-27 NOTE — Unmapped (Signed)
Attending Physician Attestation:      I confirm that I interviewed and examined the patient, reviewed all the relevant lab and diagnostic data, and we agreed upon a plan of treatment. I agree with the findings, assessment, and plan as documented by Dr. Azu.    Bera Pinela, MD

## 2022-02-16 ENCOUNTER — Ambulatory Visit: Admit: 2022-02-16 | Discharge: 2022-02-17 | Payer: MEDICARE

## 2022-02-16 DIAGNOSIS — J22 Unspecified acute lower respiratory infection: Principal | ICD-10-CM

## 2022-02-16 DIAGNOSIS — R059 Cough, unspecified type: Principal | ICD-10-CM

## 2022-02-16 MED ORDER — AZITHROMYCIN 250 MG TABLET
ORAL_TABLET | ORAL | 0 refills | 5 days | Status: CP
Start: 2022-02-16 — End: 2022-02-21

## 2022-02-16 NOTE — Unmapped (Signed)
Name:  Kayla Knight  DOB: [birth year]  Date: 02/16/2022    ASSESSMENT/PLAN:  Glennice was seen today for cough.    Diagnoses and all orders for this visit:    Cough, unspecified type  -     XR Chest 2 views    Lower respiratory infection      Kayla Knight is a 81 y.o. female presenting with a worsening cough, now productive after exposure to granddaughter who was ill.  She has had 3 weeks respiratory complaints.  She is well-appearing here, speaking in full sentences.  Oxygen 97% on room air with no signs of respiratory distress or increased work of effort.  Her lungs are clear to auscultation, no adventitial sounds.  No other concerning exam findings.  X-ray showing no acute process.  We will treat his lower respiratory infection.  Azithromycin was sent to her pharmacy.  She will continue with conservative care as needed.  ------------------------------------------------------------------------------    Chief Complaint   Patient presents with    Cough     Pt has cough for 2-3 weeks, yesterday she cough up green mucous and she concerns of pneumonia.         HPI: Kayla Knight is a 81 y.o. female presented with a main concern of a productive cough.  Concern of pneumonia.  Was around her granddaughter 3 weeks ago who had been ill.  She has since developed runny nose sore throat and a cough.  Cough is now persisting and not improving.  Now with productive sputum production.  No chest pain shortness of breath.  She does feel slightly fatigued but is still completing normal daily activities.  No other abdominal pain nausea vomiting diarrhea.  No fevers chills or sweats.    ROS:  Review of systems as above.  Rest of review of systems negative unless otherwise noted as per HPI.    I have reviewed past medical, surgical, medications, allergies, social and family histories today and updated them in Epic where appropriate.    PMH:  Past Medical History:   Diagnosis Date    Anemia     past history, over 40 yrs ago    Arthritis hand, knees, joints    Asthma     past history, no recent attacks    Asthma     patient stated due to asthmatic medication    Cataract 2008    Surgery    Diabetes mellitus (CMS-HCC)     Type II, on 9/15 patient stated she was not longer diabetic, not taking meds    Dry eye     Hearing impairment     Heart murmur 2016    Dr. 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    ASA indefinitely and plavix x 6 months per procedure note    Hypertension     pt reports taking meds since about 1995    Neuromuscular disorder (CMS-HCC) 2014    Feet & Legs    Tear of meniscus of knee     4.5 yrs ago    Urinary incontinence     seen a doctor about nighttime bathroom use (adressed in 2019)       MEDS:    Current Outpatient Medications:     amLODIPine (NORVASC) 10 MG tablet, Take 1 tablet (10 mg total) by mouth daily.,  Disp: 90 tablet, Rfl: 3    aspirin (ECOTRIN) 81 MG tablet, Take 1 tablet (81 mg total) by mouth daily., Disp: 30 tablet, Rfl: 11    clopidogreL (PLAVIX) 75 mg tablet, Take 1 tablet (75 mg total) by mouth in the morning., Disp: 30 tablet, Rfl: 11    empty container (SHARPS-A-GATOR DISPOSAL SYSTEM) Misc, Use as directed for sharps disposal, Disp: 1 each, Rfl: 2    evolocumab (REPATHA SURECLICK) 140 mg/mL PnIj, Inject the contents of 1 pen (140 mg) under the skin every fourteen (14) days., Disp: 6 mL, Rfl: 3    pantoprazole (PROTONIX) 40 MG tablet, , Disp: , Rfl:     spironolactone (ALDACTONE) 50 MG tablet, Take 1 tablet (50 mg total) by mouth daily., Disp: 90 tablet, Rfl: 3    ALL:  Allergies   Allergen Reactions    Penicillins Itching and Swelling    Lisinopril Cough    Statins-Hmg-Coa Reductase Inhibitors      Ineffective at controlling cholesterol when tried in the past.    Sulfa (Sulfonamide Antibiotics) Itching       VITALS:  Vitals:    02/16/22 0815   BP: 116/66   Pulse: 67 Resp: 16   Temp: 36.8 ??C (98.2 ??F)   SpO2: 97%     Body mass index is 23.81 kg/m??.    Physical Exam  General: No acute distress. Well developed, well nourished.   Skin: Skin in warm, dry and intact without rashes or lesions.    HEENT: Normocephalic and atraumatic. MMM. PERRLA. Conjunctiva clear. Hearing intact.  Nares patent bilaterally. No voice changes   Neck: The neck is supple without adenopathy. Trachea is midline.    Cardiac: Regular rate and rhythm. No murmurs, rubs or gallops. Normal S1 and S2.     Respiratory:  Lungs CTAB.  No wheeze, rales or rhonchi.  No increased work of effort.   Abdomen: Abdomen is soft, non-distended, non tender.  No rigidity, rebound or guarding. Normal bowel sounds.   No masses, hepatomegaly, or splenomegaly are noted. Negative murphy sign.  No tenderness over McBurney point.  No CVA tenderness.   Extremities: Normal ROM.   Neuro: Awake, AOx4.  Normal speech   Psychiatric: Appropriate mood and affect. Good judgement and insight.      TEST  RESULTS:  No results found for this visit on 02/16/22.  XR Chest 2 views    Result Date: 02/16/2022  EXAM: XR CHEST 2 VIEWS DATE: 02/16/2022 8:39 AM ACCESSION: [id number] UN DICTATED: 02/16/2022 8:40 AM INTERPRETATION LOCATION: [facility] CLINICAL INDICATION: 81 years old Female with In Clinic ; COUGH  - R05.9 - Cough, unspecified type  TECHNIQUE: PA and Lateral Chest Radiographs. COMPARISON: Chest radiography 02/17/2020 and CT chest 11/12/2019 FINDINGS: Lungs are clear. 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 No pleural effusion or pneumothorax. Cardiac silhouette is normal in size. Thoracic aorta is tortuous with calcifications. Left convex curvature of the lower thoracic spine.     No acute cardiopulmonary abnormality.     SCREENINGS:  SDOH:   I provided an intervention for the Tobacco Use SDOH domain. The intervention was Othernot needed       DEPRESSION:    PHQ-2 Score: 2    PHQ-9 Score: Edinburgh Score:      Screening complete, no depression identified / no further action needed today    Hildred Laser, PA discussed with Kayla Knight importance of having a PCP for their ongoing care.  Has pcp        Ernest Haber, PA-C  [facility] [place]  ----------------------------------------------------------------  Note - This record has been created using [employer]. Chart creation errors have been sought, but may not always have been located. Such creation errors do not reflect on the standard of medical care.

## 2022-02-20 DIAGNOSIS — E782 Mixed hyperlipidemia: Principal | ICD-10-CM

## 2022-02-20 MED ORDER — REPATHA SURECLICK 140 MG/ML SUBCUTANEOUS PEN INJECTOR
SUBCUTANEOUS | 3 refills | 84 days | Status: CP
Start: 2022-02-20 — End: ?
  Filled 2022-03-05: qty 6, 84d supply, fill #0

## 2022-02-20 NOTE — Unmapped (Signed)
[facility] Refill Coordination Note    Specialty Medication(s) to be Shipped:   General Specialty: Repatha    Other medication(s) to be shipped: No additional medications requested for fill at this time     Kayla Knight, DOB: 03-12-1941  Phone: [phone] (home)       All above HIPAA information was verified with patient.     Was a Nurse, learning disability used for this call? No    Completed refill call assessment today to schedule patient's medication shipment from the [facility] [phone]).  All relevant notes have been reviewed.     Specialty medication(s) and dose(s) confirmed: Regimen is correct and unchanged.   Changes to medications: Kayla Knight reports no changes at this time.  Changes to insurance: No  New side effects reported not previously addressed with a pharmacist or physician: None reported  Questions for the pharmacist: No    Confirmed patient received a Conservation officer, historic buildings and a Surveyor, mining with first shipment. The patient will receive a drug information handout for each medication shipped and additional FDA Medication Guides as required.       DISEASE/MEDICATION-SPECIFIC INFORMATION        For patients on injectable medications: Patient currently has 1 doses left.  Next injection is scheduled for 02/22/22.    SPECIALTY MEDICATION ADHERENCE     Medication Adherence    Patient reported X missed doses in the last month: 0  Specialty Medication: Repatha 140mg /ml  Patient is on additional specialty medications: No  Patient is on more than two specialty medications: No              Were doses missed due to medication being on hold? No    Repatha 140 mg/ml: 2 days of medicine on hand       REFERRAL TO PHARMACIST     Referral to the pharmacist: Not needed      [facility]     Shipping address confirmed in Epic.     Delivery Scheduled: Yes, Expected medication delivery date: 03/05/22.     Medication will be delivered via Same Day Courier to the prescription address in Epic WAM.    Nancy Nordmann [facility] Specialty Technician

## 2022-02-24 DIAGNOSIS — E782 Mixed hyperlipidemia: Principal | ICD-10-CM

## 2022-03-09 NOTE — Unmapped (Deleted)
Patient is requesting the following refill  Requested Prescriptions     Pending Prescriptions Disp Refills    pantoprazole (PROTONIX) 40 MG tablet 90 tablet 0     Sig: Take 1 tablet (40 mg total) by mouth daily.       Recent Visits  Date Type Provider Dept   12/09/21 Office Visit Virgina Evener, MD [facility] Internal Medicine   09/30/21 Office Visit Virgina Evener, MD [facility] Medicine   08/04/21 Telemedicine Virgina Evener, MD [facility]   06/17/21 Office Visit Virgina Evener, MD [facility] Medicine   04/02/21 Office Visit Virgina Evener, MD [facility] Internal Medicine   Showing recent visits within past 365 days with a meds authorizing provider and meeting all other requirements  Future Appointments  Date Type Provider Dept   03/30/22 Appointment Virgina Evener, MD [facility] Internal Medicine   Showing future appointments within next 365 days with a meds authorizing provider and meeting all other requirements       Labs: Not applicable this refill

## 2022-03-16 ENCOUNTER — Ambulatory Visit: Admit: 2022-03-16 | Discharge: 2022-03-17 | Payer: MEDICARE | Attending: Internal Medicine | Primary: Internal Medicine

## 2022-03-16 DIAGNOSIS — R1013 Epigastric pain: Principal | ICD-10-CM

## 2022-03-16 DIAGNOSIS — E1169 Type 2 diabetes mellitus with other specified complication: Principal | ICD-10-CM

## 2022-03-16 DIAGNOSIS — E785 Hyperlipidemia, unspecified: Principal | ICD-10-CM

## 2022-03-16 DIAGNOSIS — K219 Gastro-esophageal reflux disease without esophagitis: Principal | ICD-10-CM

## 2022-03-16 DIAGNOSIS — I10 Essential (primary) hypertension: Principal | ICD-10-CM

## 2022-03-16 DIAGNOSIS — R142 Eructation: Principal | ICD-10-CM

## 2022-03-16 LAB — COMPREHENSIVE METABOLIC PANEL
ALBUMIN/GLOBULIN RATIO: 0.8 — ABNORMAL LOW (ref 1.1–2.2)
ALBUMIN: 3.4 g/dL — ABNORMAL LOW (ref 3.5–5.0)
ALKALINE PHOSPHATASE: 92 U/L (ref 50–136)
ALT (SGPT): 22 U/L (ref 12–78)
ANION GAP: 12 mmol/L
AST (SGOT): 28 U/L (ref 7–31)
BILIRUBIN TOTAL: 0.2 mg/dL (ref 0.0–1.5)
BLOOD UREA NITROGEN: 20 mg/dL (ref 5–26)
BUN / CREAT RATIO: 19 (ref 12–25)
CALCIUM: 8.8 mg/dL (ref 8.5–10.5)
CHLORIDE: 104 mmol/L (ref 95–110)
CO2: 25.3 mmol/L (ref 21.0–31.0)
CREATININE: 1.03 mg/dL (ref 0.50–1.50)
EGFR CKD-EPI (2021) FEMALE: 55 mL/min/{1.73_m2} — ABNORMAL LOW (ref >=60)
GLOBULIN, TOTAL: 4.3 g/dL (ref 1.5–4.3)
GLUCOSE RANDOM: 97 mg/dL (ref 60–100)
POTASSIUM: 4.3 mmol/L (ref 3.5–5.1)
PROTEIN TOTAL: 7.7 g/dL (ref 6.4–8.3)
SODIUM: 141 mmol/L (ref 136–145)

## 2022-03-16 LAB — CBC W/ AUTO DIFF
HEMATOCRIT: 32.4 % — ABNORMAL LOW (ref 35.0–45.0)
HEMOGLOBIN: 11 g/dL — ABNORMAL LOW (ref 11.3–15.5)
LYMPHOCYTES ABSOLUTE COUNT: 2.1 10*9/L (ref 1.0–4.8)
LYMPHOCYTES RELATIVE PERCENT: 32.8 %
MEAN CORPUSCULAR HEMOGLOBIN CONC: 33.9 g/dL (ref 31.0–36.0)
MEAN CORPUSCULAR HEMOGLOBIN: 25.9 pg — ABNORMAL LOW (ref 26.0–32.0)
MEAN CORPUSCULAR VOLUME: 76 fL — ABNORMAL LOW (ref 89.0–97.0)
MEAN PLATELET VOLUME: 6.7 fL (ref 6.5–8.9)
MONOCYTES ABSOLUTE COUNT: 0.4 10*9/L (ref 0.0–0.8)
MONOCYTES RELATIVE PERCENT: 7.3 %
NEUTROPHILS ABSOLUTE COUNT: 4 10*9/L (ref 1.8–7.0)
NEUTROPHILS RELATIVE PERCENT: 59.9 %
PLATELET COUNT: 372 10*9/L (ref 140–440)
RED BLOOD CELL COUNT: 4.24 10*12/L (ref 3.80–5.10)
RED CELL DISTRIBUTION WIDTH: 16.2 % — ABNORMAL HIGH (ref 11.0–15.0)
WBC ADJUSTED: 6.5 10*9/L (ref 3.8–10.8)

## 2022-03-16 LAB — LIPID PANEL
CHOLESTEROL/HDL RATIO SCREEN: 2.7 (ref 0.0–4.5)
CHOLESTEROL: 161 mg/dL (ref 0–200)
HDL CHOLESTEROL: 59 mg/dL (ref 40–60)
LDL CHOLESTEROL CALCULATED: 93 mg/dL (ref 30–130)
NON-HDL CHOLESTEROL: 102 mg/dL
TRIGLYCERIDES: 45 mg/dL (ref <=150)
VLDL CHOLESTEROL CAL: 9 mg/dL — ABNORMAL LOW (ref 25–40)

## 2022-03-16 MED ORDER — PANTOPRAZOLE 40 MG TABLET,DELAYED RELEASE
ORAL_TABLET | Freq: Every day | ORAL | 3 refills | 90 days | Status: CP
Start: 2022-03-16 — End: 2023-03-16

## 2022-03-16 NOTE — Unmapped (Signed)
Ok for ginger,peppermint,aloe  Trial of gaviscon antacid  'diaphragmatic breathing',supragastric burping

## 2022-03-16 NOTE — Unmapped (Addendum)
Assessment and Plan  1. Gastroesophageal reflux disease, unspecified whether esophagitis present -cont pantoprazole,consider short term use in setting of R renal stent and decreased GFR  Ok for ginger,peppermint,aloe  Trial of gaviscon antacid  'diaphragmatic breathing',supragastric burping   2. Dyspepsia    3. Burping -f/u sibo breath test     Orders Placed This Encounter   Procedures    Hydrogen Breath test (specify SIBO vs. lactose)       Return in about 4 months (around 07/16/2022).  Signs and symptoms that should prompt return sooner than scheduled were reviewed with the patient.  Visit time: In excess of  30  minutes, more than 50% of which was spent face-to-face in counseling and education activities regarding the diagnoses above.     HPI    Chief Complaint  Chief Complaint   Patient presents with    Follow-up     Reflux last night and could not get back to sleep - took some warm  soda water and has been sleeping with garlic in her mouth at night for BP  Has not had any pantoprazole in over a month      Kayla Knight is here for c/o reflux. Can persist thru out day. Previously was bad before bed w belching. Currently occurs around meals. Pantoprazole beneficial. Has also drank warm soda. Ginger also helpful. No dysphagia. Appetite ok. No wt loss. No melena.   7/22 egd re dyspepsia,gerd,bloating- Mild esophagitis,small hiatal hernia,mild antral gastritis. Biopsies mild irritation at edge of esophagus,unremarkabl stomach and duodenum,no H pylori or Celiac sprue.     Allergies:  Penicillins, Lisinopril, Statins-hmg-coa reductase inhibitors, and Sulfa (sulfonamide antibiotics)    Medications:   Outpatient Medications Prior to Visit   Medication Sig Dispense Refill    amLODIPine (NORVASC) 10 MG tablet Take 1 tablet (10 mg total) by mouth daily. 90 tablet 3    aspirin (ECOTRIN) 81 MG tablet Take 1 tablet (81 mg total) by mouth daily. 30 tablet 11    clopidogreL (PLAVIX) 75 mg tablet Take 1 tablet (75 mg total) by mouth in the morning. 30 tablet 11    empty container (SHARPS-A-GATOR DISPOSAL SYSTEM) Misc Use as directed for sharps disposal 1 each 2    evolocumab (REPATHA SURECLICK) 140 mg/mL PnIj Inject the contents of 1 pen (140 mg) under the skin every fourteen (14) days. 6 mL 3    pantoprazole (PROTONIX) 40 MG tablet       spironolactone (ALDACTONE) 50 MG tablet Take 1 tablet (50 mg total) by mouth daily. 90 tablet 3     No facility-administered medications prior to visit.       Medical History:  Past Medical History:   Diagnosis Date    Anemia     past history, over 40 yrs ago    Arthritis     hand, knees, joints    Asthma     past history, no recent attacks    Asthma     patient stated due to asthmatic medication    Cataract 2008    Surgery    Diabetes mellitus (CMS-HCC)     Type II, on 9/15 patient stated she was not longer diabetic, not taking meds    Dry eye     Hearing impairment     Heart murmur 2016    [facility]. 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    JOINT REPLACEMENT  Knee    KNEE CARTILAGE SURGERY Right 2007, 2008    x 2    KNEE SURGERY      pt reports total left knee joint replacement surgery around 2016, 2017    PLANTAR FASCIECTOMY Left     PR REVASCULARIZE FEM/POP ARTERY,ANGIOPLASTY/STENT N/A 01/29/2020    Procedure: Peripheral Angiography W Intervetion;  Surgeon: Alvira Philips, MD;  Location: [facility] CATH;  Service: Cardiology    PR REVASCULARIZE FEM/POP ARTERY,ANGIOPLASTY/STENT Right 12/26/2021    Procedure: right renal artery intervention;  Surgeon: Alvira Philips, MD;  Location: [facility] CATH;  Service: Cardiology    PR UPPER GI ENDOSCOPY,BIOPSY N/A 04/01/2021    Procedure: UGI ENDOSCOPY; WITH BIOPSY, SINGLE OR MULTIPLE;  Surgeon: Jarvis Morgan, MD;  Location: [facility];  Service: Gastroenterology    PR XCAPSL CTRC RMVL INSJ IO LENS PROSTH W/O ECP Right 02/13/2016    Procedure: EXTRACAPSULAR CATARACT REMOVAL W/INSERTION OF INTRAOCULAR LENS PROSTHESIS, MANUAL OR MECHANICAL TECHNIQUE;  Surgeon: Garey Ham, MD;  Location: [facility] OR [facility];  Service: Ophthalmology    PR XCAPSL CTRC RMVL INSJ IO LENS PROSTH W/O ECP Left 02/27/2016    Procedure: EXTRACAPSULAR CATARACT REMOVAL W/INSERTION OF INTRAOCULAR LENS PROSTHESIS, MANUAL OR MECHANICAL TECHNIQUE;  Surgeon: Garey Ham, MD;  Location: [facility] OR [facility];  Service: Ophthalmology    RENAL ARTERY STENT  01/11/2020    ROOT CANAL      TUBAL LIGATION  1973    [facility]    WISDOM TOOTH EXTRACTION         Social History:  Tobacco use:   reports that she quit smoking about 37 years ago. Her smoking use included cigarettes. She has never used smokeless tobacco.  Alcohol use:   reports that she does not currently use alcohol.  Drug use:  reports no history of drug use.      Review of Systems:  ROS:   General ROS: negative for - fever,weight loss  HEENT ROS: negative for sore throat, or rhinitis  Respiratory ROS: no cough, shortness of breath, or wheezing  Cardiovascular ROS: no chest pain or dyspnea on exertion  Gastrointestinal ROS: no abdominal pain, change in bowel habits, or black or bloody stools  Genito-Urinary ROS: no dysuria or hematuria  Musculoskeletal ROS: negative for - new joint pain, muscle pain or muscular weakness   Dermatological ROS: negative for rash or new lesions  Neuro:no headaches      Vitals:    03/16/22 0752   BP: 130/62   BP Site: L Arm   BP Position: Sitting   BP Cuff Size: Medium   Pulse: 62   SpO2: 98%   Weight: 70.3 kg (155 lb)  Physical Exam  General: alert, oriented, no distress

## 2022-03-17 LAB — HEMOGLOBIN A1C
ESTIMATED AVERAGE GLUCOSE: 117 mg/dL
HEMOGLOBIN A1C: 5.7 % (ref 4.9–6.1)

## 2022-03-30 ENCOUNTER — Ambulatory Visit: Admit: 2022-03-30 | Discharge: 2022-03-31 | Payer: MEDICARE | Attending: Internal Medicine | Primary: Internal Medicine

## 2022-03-30 DIAGNOSIS — E1169 Type 2 diabetes mellitus with other specified complication: Principal | ICD-10-CM

## 2022-03-30 DIAGNOSIS — I150 Renovascular hypertension: Principal | ICD-10-CM

## 2022-03-30 DIAGNOSIS — I701 Atherosclerosis of renal artery: Principal | ICD-10-CM

## 2022-03-30 DIAGNOSIS — E785 Hyperlipidemia, unspecified: Principal | ICD-10-CM

## 2022-03-30 DIAGNOSIS — R102 Pelvic and perineal pain: Principal | ICD-10-CM

## 2022-03-30 NOTE — Unmapped (Signed)
Assessment and Plan      1. 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 Complaint of pelvic pain        Hemoglobin A1c is at goal.  HM in diabetes mellitusw was reviewed.  Follow up as scheduled with appropriate provider or sooner prn.    Her blood pressure has improved following procedure but is still requiring antihypertensive(s) to maintain.  Detailed medication counseling.  . Follow up as scheduled with appropriate provider or sooner prn.    Suspect pain is musculoskeletal.  Ultrasound per orders.  Follow up pending results.        No problem-specific Assessment & Plan notes found for this encounter.      Orders Placed This Encounter   Procedures    [place] Endovaginal (Non-OB)       Requested Prescriptions      No prescriptions requested or ordered in this encounter       There are no discontinued medications.    No follow-ups on file.    Signs and symptoms that should prompt return sooner than scheduled were reviewed with the patient.      [facility] Components:     Medication adherence and barriers to the treatment plan have been addressed. Opportunities to optimize healthy behaviors have been discussed. Patient / caregiver voiced understanding.        HPI      Chief Complaint    Chief Complaint   Patient presents with    Follow-up     Pain in side 8/10 with movement.   Pain is increasing      Form     Patient would like to pick up form for water aerobics at [facility].         Kayla Knight presents for follow up of the following concern(s):    She continues to have left flank pain down to groin.   It is exacerbated by standing.  She has been using an essential oil preparation for it and other problems (plantar fibromatosis, dry eyes).   No urinary habit changes, bowel habit changes, nausea or vomiting      Her blood pressure  has been better at home.  She has been taking garlic.  Does she need antihypertensive(s)?      Health Maintenance reviewed - no intervention today      I have reviewed the patient's medical history in detail and updated the computerized patient record.              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 90 tablet 3    aspirin (ECOTRIN) 81 MG tablet Take 1 tablet (81 mg total) by mouth daily. 30 tablet 11    clopidogreL (PLAVIX) 75 mg tablet Take 1 tablet (75 mg total) by mouth in the morning. 30 tablet 11    empty container (SHARPS-A-GATOR DISPOSAL SYSTEM) Misc Use as directed for sharps disposal 1 each 2    evolocumab (REPATHA SURECLICK) 140 mg/mL PnIj Inject the contents of 1 pen (140 mg) under the skin every fourteen (14) days. 6 mL 3    pantoprazole (PROTONIX) 40 MG tablet       spironolactone (ALDACTONE) 50 MG tablet Take 1 tablet (50 mg total) by mouth daily. 90 tablet 3    pantoprazole (PROTONIX) 40 MG tablet Take 1 tablet (40 mg total) by mouth daily. 90 tablet 3     No facility-administered medications prior to visit.       Medical History:  Past Medical History:   Diagnosis Date    Anemia     past history, over 40 yrs ago    Arthritis     hand, knees, joints    Asthma     past history, no recent attacks    Asthma     patient stated due to asthmatic medication    Cataract 2008    Surgery    Diabetes mellitus (CMS-HCC)     Type II, on 9/15 patient stated she was not longer diabetic, not taking meds    Dry eye     Hearing impairment     Heart murmur 2016    [facility]. Verdie Shire @ [facility]    Heart valve disease     Heart mumur, pt reports diagnosis 10 yrs ago (05/10/20)    History of stent insertion of renal artery 01/29/2020    ASA indefinitely and plavix x 6 months per procedure note    Hypertension     pt reports taking meds since about 1995    Neuromuscular disorder (CMS-HCC) 2014    Feet & Legs    Tear of meniscus of knee     4.5 yrs ago    Urinary incontinence     seen a doctor about nighttime bathroom use (adressed in 2019)       Surgical History:  Past Surgical History:   Procedure Laterality Date    CATARACT EXTRACTION W/ INTRAOCULAR LENS IMPLANT Left 02/27/2016    EYE SURGERY  01/30/16    Cataract surgery is scheduled.    JOINT REPLACEMENT  Knee    KNEE CARTILAGE SURGERY Right 2007, 2008    x 2    KNEE SURGERY      pt reports total left knee joint replacement surgery around 2016, 2017    PLANTAR FASCIECTOMY Left     PR REVASCULARIZE FEM/POP ARTERY,ANGIOPLASTY/STENT N/A 01/29/2020    Procedure: Peripheral Angiography W Intervetion;  Surgeon: Alvira Philips, MD;  Location: [facility] CATH;  Service: Cardiology    PR REVASCULARIZE FEM/POP ARTERY,ANGIOPLASTY/STENT Right 12/26/2021    Procedure: right renal artery intervention;  Surgeon: Alvira Philips, MD;  Location: [facility] CATH;  Service: Cardiology    PR UPPER GI  ENDOSCOPY,BIOPSY N/A 04/01/2021    Procedure: UGI ENDOSCOPY; WITH BIOPSY, SINGLE OR MULTIPLE;  Surgeon: Jarvis Morgan, MD;  Location: [facility];  Service: Gastroenterology    PR XCAPSL CTRC RMVL INSJ IO LENS PROSTH W/O ECP Right 02/13/2016    Procedure: EXTRACAPSULAR CATARACT REMOVAL W/INSERTION OF INTRAOCULAR LENS PROSTHESIS, MANUAL OR MECHANICAL TECHNIQUE;  Surgeon: Garey Ham, MD;  Location: [facility] OR [facility];  Service: Ophthalmology    PR XCAPSL CTRC RMVL INSJ IO LENS PROSTH W/O ECP Left 02/27/2016    Procedure: EXTRACAPSULAR CATARACT REMOVAL W/INSERTION OF INTRAOCULAR LENS PROSTHESIS, MANUAL OR MECHANICAL TECHNIQUE;  Surgeon: Garey Ham, MD;  Location: [facility] OR [facility];  Service: Ophthalmology    RENAL ARTERY STENT  01/11/2020    ROOT CANAL      TUBAL LIGATION  1973    [facility]    WISDOM TOOTH EXTRACTION         Social History:  Tobacco use:   reports that she quit smoking about 37 years ago. Her smoking use included cigarettes. She has never used smokeless tobacco.  Alcohol use:   reports that she does not currently use alcohol.  Drug use:  reports no history of drug use.      Family History:  Family History   Problem Relation Age of Onset    Heart disease Mother     Hypertension Mother     Dementia Mother     Arthritis Mother     Heart disease Father     Thyroid disease Father     Glaucoma Father     Alcohol abuse Father     Heart attack Father 48    Arthritis Father     Hypertension Father         Massive heart attack    Arthritis Sister     Glaucoma Sister     Hypertension Sister     Diabetes Sister     Diabetes Daughter     Diabetes Paternal Aunt     No Known Problems Brother     No Known Problems Maternal Aunt     No Known Problems Maternal Uncle     No Known Problems Paternal Uncle     No Known Problems Maternal Grandmother     No Known Problems Maternal Grandfather     No Known Problems Paternal Grandmother     No Known Problems Paternal Grandfather     Arthritis Sister         Noticeable in extremities    COPD Sister     Depression Sister     Hypertension Sister     Glaucoma Sister     Asthma Daughter Ocassional    Hypertension Daughter     Miscarriages / Stillbirths Daughter     Cancer Maternal Aunt         Died more than 83yrs ago    Early death Maternal Aunt     Diabetes Paternal Aunt         4 Aunts    Kidney disease Paternal Aunt     Amblyopia Neg Hx     Blindness Neg Hx     Cancer Neg Hx     Cataracts Neg Hx     Macular degeneration Neg Hx     Retinal detachment Neg Hx     Strabismus Neg Hx     Stroke Neg Hx            Review of Systems:  Pertinent review of systems is noted in the HPI.         Physical Exam      BP 128/78  - Pulse 74  - Temp 36.8 ??C (98.3 ??F) (Oral)  - Wt 70.8 kg (156 lb)  - SpO2 98%  - BMI 24.43 kg/m??       BP Readings from Last 3 Encounters:   03/30/22 128/78   03/16/22 130/62   02/16/22 116/66       Wt Readings from Last 3 Encounters:   03/30/22 70.8 kg (156 lb)   03/16/22 70.3 kg (155 lb)   02/16/22 68.9 kg (152 lb)       She appears well, in no apparent distress.  Alert and oriented times three, pleasant and cooperative. Vital signs are as documented in vital signs section.

## 2022-04-17 DIAGNOSIS — R9389 Abnormal findings on diagnostic imaging of other specified body structures: Principal | ICD-10-CM

## 2022-05-11 ENCOUNTER — Ambulatory Visit: Admit: 2022-05-11 | Discharge: 2022-05-12 | Payer: MEDICARE | Attending: Internal Medicine | Primary: Internal Medicine

## 2022-05-11 DIAGNOSIS — R29898 Other symptoms and signs involving the musculoskeletal system: Principal | ICD-10-CM

## 2022-05-11 MED ORDER — CLOPIDOGREL 75 MG TABLET
ORAL_TABLET | Freq: Every day | ORAL | 3 refills | 90 days | Status: CP
Start: 2022-05-11 — End: 2023-05-11

## 2022-05-11 NOTE — Unmapped (Signed)
DIVISION OF CARDIOLOGY  [employer] [facility] [place]        Date of Service: 05/11/2022      PCP: Referring Provider:   Dora Sims, MD  [address] [facility]  [place] [address]  Phone: [phone]  Fax: [phone] Virgina Evener, MD  [address] [facility]  [place],  [place] [address]  Phone: [phone]  Fax: [phone]     ASSESSMENT & PLAN:   Kayla Knight is a 81 y.o. female w/PMHx of HTN, HLD, Endometriosis and DM2 who presents for routine follow up.    Hypertension- uncontrolled/ non-compliance  Resistant HTN  History of renal artery stenosis s/p stenting to the left renal artery (2021)  Blood pressures are poorly controlled. Previously, she has been seeing BPs of 150-160s and is now seeing BPs at 190s while she has been off losartan/hydrochlorothiazide. She stopped taking losartan/hydrochlorothiazide due to concerns that it was not working- BPs were not controlled on 3 drug regimen suggesting resistant HTN. Suspect she may have some degree of secondary HTN.   Plan:  - Continue amlodipine 10mg  daily, continue spironolactone 25mg  with garlic. If BPs worsen with decrease to 25mg , would increase back to 50mg .  - Continue Plavix 75 mg daily, asa 81 - can transition to monotherapy on follow up visit  - Sleep study to evaluate for secondary causes of HTN; pending    Hyperlipidemia  Lab Results   Component Value Date    LDL 93 03/16/2022   Well controlled on Repatha.    Valvular heart disease  She was noted to have moderate aortic regurgitation and mild mitral regurgitation on last echocardiogram.  Plan:  - 12/2021 TTE showing mild to mod AI and mild MR; stable from prior. Continue with q30yr follow up.    Type II DM  Lab Results   Component Value Date    A1C 5.7 03/16/2022   Not on therapies. Diet controlled.      No follow-ups on file.      SUBJECTIVE:        Reason for Consultation: HTN    History of Present Illness: Kayla Knight is a 81 y.o. female w/PMHx of uncontrolled hypertension, history of renal artery stenosis status post stenting to the left renal artery, hyperlipidemia, type 2 diabetes and medication nonadherence.  She comes in today for routine follow-up.    Kayla Knight has a history of longstanding hypertension previously well controlled on maxzide, but requiring increasing medications in 2020-2021. Additionally had admission for HTN urgency in 10/2019.  Renal artery duplex 01-15-20 revealed bilateral renal artery stenosis, for which he underwent left renal artery stenting on 01-29-20; angiography showed a 60% right renal artery stenosis so no intervention was completed.      She was concerned regarding pain in her flank and was referred for renal artery duplex that did show potential progression in stenosis. She was sent for cath and had stenting of the inferior branch of the right renal artery with good result. She did not see any change in her symptoms following this and has been seeing blood pressures of 190s frequently. She self-discontinued losartan-hydrochlorothiazide and has been resistant in the past to further titration.    She denies complaints of chest pain, dyspnea, exertional dyspnea, fatigue, palpitations, orthopnea, PND.    Interval history:  She reports that after starting spironolactone, she did not see improvement in her blood pressure until  she started taking garlic. Currently she is taking it once daily, but would like to increase to twice daily.   She has continued to have flank discomfort and ultimately had pelvic US which demonstrates increased endometrial stripe. She has a history of endometriosis and is planned for endometrial biopsy. She had prior CT that did not show any abnormality in the flank region or evidence of endometrial invasion.      Cardiovascular History:  Mitral regurgitation and moderate aortic regurgitation  Hypertension (20 years)  History of anemia in her 90s    I have independently reviewed of all the diagnostic studies. I have reviewed the old medical records from [facility] prior to this clinic visit.     Cardiovascular Studies Date Comments     ECG  NSR with 1st deg AVB, +PACs   Echo 01/05/2022                      10/20/2019   1. The left ventricle is normal in size with normal wall thickness.    2. The left ventricular systolic function is normal, LVEF is visually estimated at > 55%.    3. There is mild mitral valve regurgitation.    4. There is mild to moderate aortic regurgitation.    5. The right ventricle is normal in size, with normal systolic function.       1. The left ventricle is normal in size with mildly to moderately increased wall thickness.    2. The left ventricular systolic function is normal, LVEF is visually estimated at > 55%.    3. There is moderate mitral valve regurgitation.    4. The aortic valve is trileaflet with mildly thickened leaflets with normal excursion.    5. There is mild aortic regurgitation.    6. The left atrium is mildly dilated in size.    7. The right ventricle is normal in size, with normal systolic function.   Stress test     Cardiac catheterization     [id number] Genotype     Rhythm Monitoring     Cardiac CT/MRI     Electrophysiology      Cardiovascular Surgery     Peripheral Vascular Studies Renal Angiogram 12/26/2021                                      Renal Angiogram   Patent stent in the left renal artery with 40% instent restenosis with no pressure gradient across the lesion  Diffuse right renal artery atherosclerotic disease including a 50% stenosis in the main branch of the right renal artery (pressure gradient was 2 - 3 mm Hg mean and 6 mm Hg systolic), a 50% ostial stenosis of the inferior branch of the right renal artery (pressure gradient was 2 mm Hg mean and 5 mm Hg systolic) and a 70% stenosis in the mid portion of the inferior branch of the right renal artery (pressure gradient was 6 mm Hg mean and 13 mm Hg systolic)   Successful PTI of mid-stenosis in the inferior branch of the right renal artery with placement of an Onyx drug eluting stent      Bilateral renal artery stenosis including heavily calcified serial 80% lesions in the left renal artery and 60% right renal artery stenosis at a bifurcation  Successful intervention on left renal artery with placement of an Onyx drug eluting stent  I have personally reviewed the recent imaging studies on this patient.    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    JOINT REPLACEMENT  Knee    KNEE CARTILAGE SURGERY Right 2007, 2008    x 2    KNEE SURGERY      pt reports total left knee joint replacement surgery around 2016, 2017    PLANTAR FASCIECTOMY Left     PR REVASCULARIZE FEM/POP ARTERY,ANGIOPLASTY/STENT N/A 01/29/2020    Procedure: Peripheral Angiography W Intervetion;  Surgeon: Alvira Philips, MD;  Location: [facility] CATH;  Service: Cardiology    PR REVASCULARIZE FEM/POP ARTERY,ANGIOPLASTY/STENT Right 12/26/2021    Procedure: right renal artery intervention;  Surgeon: Alvira Philips, MD;  Location: [facility] CATH;  Service: Cardiology    PR UPPER GI ENDOSCOPY,BIOPSY N/A 04/01/2021    Procedure: UGI ENDOSCOPY; WITH BIOPSY, SINGLE OR MULTIPLE;  Surgeon: Jarvis Morgan, MD;  Location: [facility];  Service: Gastroenterology    PR XCAPSL CTRC RMVL INSJ IO LENS PROSTH W/O ECP Right 02/13/2016    Procedure: EXTRACAPSULAR CATARACT REMOVAL W/INSERTION OF INTRAOCULAR LENS PROSTHESIS, MANUAL OR MECHANICAL TECHNIQUE;  Surgeon: Garey Ham, MD;  Location: [facility] OR [facility];  Service: Ophthalmology    PR XCAPSL CTRC RMVL INSJ IO LENS PROSTH W/O ECP Left 02/27/2016    Procedure: EXTRACAPSULAR CATARACT REMOVAL W/INSERTION OF INTRAOCULAR LENS PROSTHESIS, MANUAL OR MECHANICAL TECHNIQUE;  Surgeon: Garey Ham, MD;  Location: [facility] OR [facility];  Service: Ophthalmology    RENAL ARTERY STENT  01/11/2020    ROOT CANAL      TUBAL LIGATION  1973    [facility]    WISDOM TOOTH EXTRACTION         Medications:   Patient's Medications   New Prescriptions    No medications on file   Previous Medications    AMLODIPINE (NORVASC) 10 MG TABLET    Take 1 tablet (10 mg total) by mouth daily.    ASPIRIN (ECOTRIN) 81 MG TABLET    Take 1 tablet (81 mg total) by mouth daily.    CLOPIDOGREL (PLAVIX) 75 MG TABLET    Take 1 tablet (75 mg total) by mouth in the morning.    EMPTY CONTAINER (SHARPS-A-GATOR DISPOSAL SYSTEM) MISC    Use as directed for sharps disposal    EVOLOCUMAB (REPATHA SURECLICK) 140 MG/ML  PNIJ    Inject the contents of 1 pen (140 mg) under the skin every fourteen (14) days.    PANTOPRAZOLE (PROTONIX) 40 MG TABLET        PANTOPRAZOLE (PROTONIX) 40 MG TABLET    Take 1 tablet (40 mg total) by mouth daily.    SPIRONOLACTONE (ALDACTONE) 50 MG TABLET    Take 1 tablet (50 mg total) by mouth daily.   Modified Medications    No medications on file   Discontinued Medications    No medications on file       Allergies:  Allergies Allergen Reactions    Penicillins Itching and Swelling    Lisinopril Cough    Statins-Hmg-Coa Reductase Inhibitors      Ineffective at controlling cholesterol when tried in the past.    Sulfa (Sulfonamide Antibiotics) Itching       Social History:  She  reports that she quit smoking about 38 years ago. Her smoking use included cigarettes. She has never used smokeless tobacco. She reports that she does not currently use alcohol. She reports that she does not use drugs.    Family History:  Her family history includes Alcohol abuse in her father; Arthritis in her father, mother, sister, and sister; Asthma in her daughter; COPD in her sister; Cancer in her maternal aunt; Dementia in her mother; Depression in her sister; Diabetes in her daughter, paternal aunt, paternal aunt, and sister; Early death in her maternal aunt; Glaucoma in her father, sister, and sister; Heart attack (age of onset: 23) in her father; Heart disease in her father and mother; Hypertension in her daughter, father, mother, sister, and sister; Kidney disease in her paternal aunt; Miscarriages / Stillbirths in her daughter; No Known Problems in her brother, maternal aunt, maternal grandfather, maternal grandmother, maternal uncle, paternal grandfather, paternal grandmother, and paternal uncle; Thyroid disease in her father.    Review of Systems  10 systems were reviewed and negative except as noted in HPI.      OBJECTIVE:       Physical Exam  BP 153/82  - Pulse 81  - Ht 170.2 cm (5' 7)  - Wt 70.9 kg (156 lb 6.4 oz)  - SpO2 98%  - BMI 24.50 kg/m??    Wt Readings from Last 3 Encounters:   05/11/22 70.9 kg (156 lb 6.4 oz)   03/30/22 70.8 kg (156 lb)   03/16/22 70.3 kg (155 lb)       General:  Alert, no distress.   Eyes:  Intact, sclerae anicteric.   ENT: Moist mucous membranes. Supple. No obvious thyromegaly.   Respiratory:   CTAB bilaterally with normal WOB.   Cardiovascular:  No carotid bruit, JVD normal at 90 degrees,  3/6 decrescendo diastolic murmur heard throughout the precordium, RRR, no rubs or gallops   No edema bilaterally. Pulses full and equal throughout   Gastrointestinal:   nondistented   Musculoskeletal: Normal bulk   Skin: Warm, well perfused.   Neurologic: No focal deficits.       Most recent labs   Lab Results   Component Value Date    Sodium 141 03/16/2022    Sodium 137 04/24/2019    Potassium 4.3 03/16/2022    Potassium 3.7 04/24/2019    Chloride 104 03/16/2022    Chloride 103 04/24/2019    CO2 25.3 03/16/2022    CO2 30.3 04/24/2019    BUN 20 03/16/2022    BUN 12.00 04/24/2019    Creatinine 1.03 03/16/2022  Creatinine 0.8 04/24/2019    Magnesium 2.0 11/13/2019     Lab Results   Component Value Date    HGB 11.0 (L) 03/16/2022    HGB 12.7 10/20/2018    MCV 76.0 (L) 03/16/2022    MCV 77 (L) 10/20/2018    Platelet 372 03/16/2022    Platelet 343 10/20/2018     Lab Results   Component Value Date    Cholesterol 161 03/16/2022    Cholesterol, Total 248 (H) 10/20/2018    Triglycerides 45 03/16/2022    Triglycerides 121 10/20/2018    HDL 59 03/16/2022    HDL 49 10/20/2018    Non-HDL Cholesterol 102 03/16/2022    LDL calculated 174.8 (H) 10/20/2018    LDL Calculated 93 03/16/2022    Hemoglobin A1C 5.7 03/16/2022    Hemoglobin A1C 6.0 04/24/2019    TSH 1.040 04/02/2021    TSH 0.88 10/06/2016    PRO-BNP 88.2 02/17/2020    INR 0.91 08/13/2018     - Deitra Mayo, MD MPH  Cardiovascular Medicine Fellow, PGY-6

## 2022-05-12 ENCOUNTER — Ambulatory Visit: Admit: 2022-05-12 | Discharge: 2022-05-13 | Payer: MEDICARE

## 2022-05-12 DIAGNOSIS — R9389 Abnormal findings on diagnostic imaging of other specified body structures: Principal | ICD-10-CM

## 2022-05-12 DIAGNOSIS — N950 Postmenopausal bleeding: Principal | ICD-10-CM

## 2022-05-12 NOTE — Unmapped (Signed)
Outpatient Gynecology Note - New Patient    ASSESSMENT AND PLAN     Problem List Items Addressed This Visit    None    No follow-ups on file.    Gerlene Fee, MD    SUBJECTIVE     This 81 y.o. [phone] is a new [facility] patient who presents for a problem visit.    HPI:  Patient has had left sided pelvic pain beginning a little less year ago. The pain is described as a sharp, shooting pain. This pain has become more severe over time. The pain typically occurs when she is bending over and wiping. The pain resolves with movement change. Declines vaginal bleeding except for a small amount of bleeding following the transvaginal ultrasound. 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 She does have a history of endometriosis.  She has felt increased fatigue and reports new onset night sweats beginning last year. Denies fever, chills, weight changes, nausea or vomiting. Patient has desired pregnancy for several years.     TVUS on 03/30/22 showed a 4.8 x 1.9 x 2.4 cm, a thickened endometrial striped of 8 mm with fluid and debris in the endometrial cavity. 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    MRI Abdomen 01/23/22 showed evidence of bilateral renal cysts with a 1.2 cm right and 2.5 cm left renal cyst demonstrating Bosniak 78F morphology    GYN History:   Menstrual cycles: postmenopausal, last period in her 28's  Last Pap: unknown   Abnormal Pap hx:  Yes, patient reports being treated for this, but does not   STI history: negative  Sexually active: no      Contraceptive method: current abstinence     OB History   Gravida Para Term Preterm AB Living   3 1 1  0 2 1   SAB IAB Ectopic Molar Multiple Live Births   0 0 0   0        # Outcome Date GA Lbr Len/2nd Weight Sex Delivery Anes PTL Lv   3 AB            2 AB            1 Term      Vag-Spont          Past Medical History:   Diagnosis Date    Anemia     past history, over 40 yrs ago    Arthritis     hand, knees, joints    Asthma     past history, no recent attacks    Asthma     patient stated due to asthmatic medication    Cataract 2008    Surgery    Diabetes mellitus (CMS-HCC)     Type II, on 9/15 patient stated she was not longer diabetic, not taking meds    Dry eye     Hearing impairment     Heart murmur 2016    [facility] Verdie [facility]    Heart valve disease     Heart mumur, pt reports diagnosis 10 yrs ago (05/10/20)    History of stent insertion of renal artery 01/29/2020    ASA indefinitely and plavix x 6 months per procedure note    Hypertension     pt reports taking meds since about 1995    Neuromuscular disorder (CMS-HCC) 2014    Feet & Legs    Tear of meniscus of knee  4.5 yrs ago    Urinary incontinence     seen a doctor about nighttime bathroom use (adressed in 2019)       Past Surgical History:   Procedure Laterality Date    CATARACT EXTRACTION W/ INTRAOCULAR LENS IMPLANT Left 02/27/2016    EYE SURGERY  01/30/16    Cataract surgery is scheduled.    JOINT REPLACEMENT  Knee    KNEE CARTILAGE SURGERY Right 2007, 2008    x 2    KNEE SURGERY      pt reports total left knee joint replacement surgery around 2016, 2017    PLANTAR FASCIECTOMY Left     PR REVASCULARIZE FEM/POP ARTERY,ANGIOPLASTY/STENT N/A 01/29/2020    Procedure: Peripheral Angiography W Intervetion;  Surgeon: Alvira Philips, MD;  Location: [facility] CATH;  Service: Cardiology    PR REVASCULARIZE FEM/POP ARTERY,ANGIOPLASTY/STENT Right 12/26/2021    Procedure: right renal artery intervention;  Surgeon: Alvira Philips, MD;  Location: [facility] CATH;  Service: Cardiology    PR UPPER GI ENDOSCOPY,BIOPSY N/A 04/01/2021    Procedure: UGI ENDOSCOPY; WITH BIOPSY, SINGLE OR MULTIPLE;  Surgeon: Jarvis Morgan, MD;  Location: [facility] PROCEDURES [facility];  Service: Gastroenterology    PR XCAPSL CTRC RMVL INSJ IO LENS PROSTH W/O ECP Right 02/13/2016    Procedure: EXTRACAPSULAR CATARACT REMOVAL W/INSERTION OF INTRAOCULAR LENS PROSTHESIS, MANUAL OR MECHANICAL TECHNIQUE;  Surgeon: Garey Ham, MD;  Location: [facility];  Service: Ophthalmology    PR XCAPSL CTRC RMVL INSJ IO LENS PROSTH W/O ECP Left 02/27/2016    Procedure: EXTRACAPSULAR CATARACT REMOVAL W/INSERTION OF INTRAOCULAR LENS PROSTHESIS, MANUAL OR MECHANICAL TECHNIQUE;  Surgeon: Garey Ham, MD;  Location: [facility];  Service: Ophthalmology    RENAL ARTERY STENT  01/11/2020    ROOT CANAL      TUBAL LIGATION  1973    [facility]    WISDOM TOOTH EXTRACTION         Social History     Tobacco Use    Smoking status: Former     Packs/day: 0.00     Years: 0.00     Additional pack years: 0.00     Total pack years: 0.00     Types: Cigarettes     Quit date: 05/19/1984     Years since quitting: 38.0    Smokeless tobacco: Never   Vaping Use    Vaping Use: Never used   Substance Use Topics    Alcohol use: Not Currently    Drug use: Never       Family History   Problem Relation Age of Onset    Heart disease Mother     Hypertension Mother     Dementia Mother     Arthritis Mother     Heart disease Father     Thyroid disease Father     Glaucoma Father     Alcohol abuse Father     Heart attack Father 87    Arthritis Father     Hypertension Father         Massive heart attack    Arthritis Sister     Glaucoma Sister     Hypertension Sister     Diabetes Sister     Diabetes Daughter     Diabetes Paternal Aunt     No Known Problems Brother     No Known Problems Maternal Aunt     No Known Problems Maternal Uncle     No Known Problems  Paternal Uncle     No Known Problems Maternal Grandmother     No Known Problems Maternal Grandfather     No Known Problems Paternal Grandmother     No Known Problems Paternal Grandfather     Arthritis Sister         Noticeable in extremities    COPD Sister     Depression Sister     Hypertension Sister     Glaucoma Sister     Asthma Daughter         Ocassional    Hypertension Daughter     Miscarriages / [place] Daughter     Cancer Maternal Aunt Died more than 36yrs ago    Early death Maternal Aunt     Diabetes Paternal Aunt         4 Aunts    Kidney disease Paternal Aunt     Amblyopia Neg Hx     Blindness Neg Hx     Cancer Neg Hx     Cataracts Neg Hx     Macular degeneration Neg Hx     Retinal detachment Neg Hx     Strabismus Neg Hx     Stroke Neg Hx        Current Outpatient Medications   Medication Sig Dispense Refill    amLODIPine (NORVASC) 10 MG tablet Take 1 tablet (10 mg total) by mouth daily. 90 tablet 3    clopidogreL (PLAVIX) 75 mg tablet Take 1 tablet (75 mg total) by mouth daily. 90 tablet 3    empty container (SHARPS-A-GATOR DISPOSAL SYSTEM) Misc Use as directed for sharps disposal 1 each 2    evolocumab (REPATHA SURECLICK) 140 mg/mL PnIj Inject the contents of 1 pen (140 mg) under the skin every fourteen (14) days. 6 mL 3    pantoprazole (PROTONIX) 40 MG tablet       pantoprazole (PROTONIX) 40 MG tablet Take 1 tablet (40 mg total) by mouth daily. 90 tablet 3    spironolactone (ALDACTONE) 50 MG tablet Take 1 tablet (50 mg total) by mouth daily. 90 tablet 3     No current facility-administered medications for this visit.       Allergies   Allergen Reactions    Penicillins Itching and Swelling    Lisinopril Cough    Statins-Hmg-Coa Reductase Inhibitors      Ineffective at controlling cholesterol when tried in the past.    Sulfa (Sulfonamide Antibiotics) Itching       REVIEW OF SYSTEMS: See HPI; remaining balance of 10 systems are negative/non-contributory.    OBJECTIVE     There were no vitals taken for this visit.  General: well-appearing and in NAD  Lymph:  no clavicular, axillary or inguinal lymphadenopathy  Neck:  thyroid smooth, non-enlarged, without nodules  Breasts:  no nipple discharge, no suspicious masses   Abdomen:  soft, NT, ND, no organomegaly  Pelvic:  normal external genitalia, BUS negative, atrophic vaginal tissue, lichen sclerosis, cervix are without gross lesions, uterus is small and non-tender, no CMT, no adnexal tenderness or palpable masses  Psych: pleasant and interactive, answers questions appropriately    LABS     Results for orders placed or performed in visit on 03/16/22   Lipid Panel   Result Value Ref Range    Triglycerides 45 <=150 mg/dL    Cholesterol 454 0 - 200 mg/dL    HDL 59 40 - 60 mg/dL    LDL Calculated 93 30 - 130 mg/dL  VLDL Cholesterol Cal 9 (L) 25 - 40 mg/dL    Chol/HDL Ratio 2.7 0.0 - 4.5    Non-HDL Cholesterol 102 mg/dL    FASTING Unknown    Hemoglobin A1c   Result Value Ref Range    Hemoglobin A1C 5.7 4.9 - 6.1 %    Estimated Average Glucose 117 mg/dL   Comprehensive metabolic panel   Result Value Ref Range    Sodium 141 136 - 145 mmol/L    Potassium 4.3 3.5 - 5.1 mmol/L    Chloride 104 95 - 110 mmol/L    CO2 25.3 21.0 - 31.0 mmol/L    Anion Gap 12 mmol/L    BUN 20 5 - 26 mg/dL    Creatinine 1.61 0.96 - 1.50 mg/dL    BUN/Creatinine Ratio 19 12 - 25    eGFR CKD-EPI (2021) Female 55 (L) >=60 mL/min/1.49m2    Glucose 97 60 - 100 mg/dL    Calcium 8.8 8.5 - 04.5 mg/dL    Albumin 3.4 (L) 3.5 - 5.0 g/dL    Total Protein 7.7 6.4 - 8.3 g/dL    Total Bilirubin 0.2 0.0 - 1.5 mg/dL    AST 28 7 - 31 U/L    ALT 22 12 - 78 U/L    Alkaline Phosphatase 92 50 - 136 U/L    Globulin, Total 4.3 1.5 - 4.3 g/dL    Albumin/Globulin Ratio 0.8 (L) 1.1 - 2.2   CBC w/ Differential   Result Value Ref Range    WBC 6.5 3.8 - 10.8 10*9/L    RBC 4.24 3.80 - 5.10 10*12/L    HGB 11.0 (L) 11.3 - 15.5 g/dL    HCT 40.9 (L) 81.1 - 45.0 %    MCV 76.0 (L) 89.0 - 97.0 fL    MCH 25.9 (L) 26.0 - 32.0 pg    MCHC 33.9 31.0 - 36.0 g/dL    RDW 91.4 (H) 78.2 - 15.0 %    MPV 6.7 6.5 - 8.9 fL    Platelet 372 140 - 440 10*9/L    Neutrophils % 59.9 %    Lymphocytes % 32.8 %    Monocytes % 7.3 %    Absolute Neutrophils 4.0 1.8 - 7.0 10*9/L    Absolute Lymphocytes 2.1 1.0 - 4.8 10*9/L    Absolute Monocytes 0.4 0.0 - 0.8 10*9/L

## 2022-05-12 NOTE — Unmapped (Signed)
Patient seen for left sided pelvic pain with first onset nearly one year ago. No abnormal vaginal bleeding.     TVUS on 03/30/22 showed a 4.8 x 1.9 x 2.4 cm, a thickened endometrial striped of 8 mm with fluid and debris in the endometrial cavity. 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    MRI Abdomen 01/23/22 showed evidence of bilateral renal cysts with a 1.2 cm right and 2.5 cm left renal cyst demonstrating Bosniak 62F morphology. No pelvic findings.     Endometrial biopsy and diagnostic pap smear performed today. Follow up dependent on results. Patient desires hysterectomy.

## 2022-05-15 NOTE — Unmapped (Signed)
Pt called states that she has noticed some bleeding/ spotting once she awoke from her nap this afternoon. Pt states that she is not filling up pad but currently is wearing a panty liner. Pt states that she is also experiencing some cramping as well. Pt advised of bleeding precautions and advised to go to to ED should bleeding increase. In the meantime message would be sent to provider for further interpretation of results and what is the next steps. Pt V/U. Pt advised that if she can tolerate tylenol or Ibuprofen and have no allergies then she can take for pain relief. Pt V/U

## 2022-05-16 NOTE — Unmapped (Signed)
I have reviewed the documentation with the patient and discussed the plan of care. I agree with the plan as noted below.    A: Endometrium, biopsy  - Predominantly scant superficial strips and small fragments of inactive endometrial tissue   - Fragment of endometrial tissue with fibrotic stroma, possibly suggestive of endometrial polyp  - No hyperplasia or malignancy identified in this sample  - See comment    Joelene Millin. Marlynn Perking, MD  May 15, 2022 5:09 PM

## 2022-05-16 NOTE — Unmapped (Signed)
I reviewed and discussed the case with the resident, but did not see the patient.  I agree with the assessment and plan as documented in the resident's note. Cyril Mourning, MD

## 2022-05-16 NOTE — Unmapped (Signed)
Called patient to review results and see how she is doing. Patient reported that she had light spotting which has since resolved but the cramping is what is bothering her most. She does not like to take medicine if she does not have to. We discussed use of Tylenol and non-med management including hot pad and warm bath.     Pt will follow up in 1 week for [facility] visit to discuss polyp management

## 2022-05-18 NOTE — Unmapped (Signed)
Called and left message for patient to schedule f/u in [facility]

## 2022-05-20 NOTE — Unmapped (Signed)
Attending Physician Attestation:      I confirm that I interviewed and examined the patient, reviewed all the relevant lab and diagnostic data, and we agreed upon a plan of treatment. I agree with the findings, assessment, and plan as documented by Dr. Azu.    Tayonna Bacha, MD

## 2022-05-25 NOTE — Unmapped (Signed)
Called to inform patient regarding EMB results.     A: Endometrium, biopsy  - Predominantly scant superficial strips and small fragments of inactive endometrial tissue   - Fragment of endometrial tissue with fibrotic stroma, possibly suggestive of endometrial polyp  - No hyperplasia or malignancy identified in this sample  - See comment    Provided patient information regarding results. Offered multiple options for follow up including scheduling hysteroscopy with dilation and curettage and polypectomy, repeat EMB in 6 months, or watchful waiting. The risks and benefits of all options were discussed and patient opted for watchful waiting with plans for hysteroscopy with polypectomy if she has continued bleeding.

## 2022-05-27 NOTE — Unmapped (Signed)
..  [facility] Refill Coordination Note    Kayla Knight, [place]: July 17, 1941  Phone: [phone] (home)       All above HIPAA information was verified with patient.         05/26/2022     6:49 PM   Specialty Rx Medication Refill Questionnaire   Which Medications would you like refilled and shipped? Repatha   Please list all current allergies: Penicillin, Sulfur, and Statins   Have you missed any doses in the last 30 days? No   Have you had any changes to your medication(s) since your last refill? No   How many days remaining of each medication do you have at home? None   If receiving an injectable medication, next injection date is 06/11/2022   Have you experienced any side effects in the last 30 days? No   Please enter the full address (street address, city, state, zip code) where you would like your medication(s) to be delivered to. [address], [place], [place] [address]   Please specify on which day you would like your medication(s) to arrive. Note: if you need your medication(s) within 3 days, please call the pharmacy to schedule your order at [phone]  06/05/2022   Has your insurance changed since your last refill? No   Would you like a pharmacist to call you to discuss your medication(s)? No   Do you require a signature for your package? (Note: if we are billing Medicare Part B or your order contains a controlled substance, we will require a signature) No   Additional Comments: My neighbor will refrigerate my medication in the event that I'm not at home.         Completed refill call assessment today to schedule patient's medication shipment from the [facility] [phone]).  All relevant notes have been reviewed.       Confirmed patient received a Conservation officer, historic buildings and a Surveyor, mining with first shipment. The patient will receive a drug information handout for each medication shipped and additional FDA Medication Guides as required.         REFERRAL TO PHARMACIST     Referral to the pharmacist: Not needed      [facility]     Shipping address confirmed in Epic.     Delivery Scheduled: Yes, Expected medication delivery date: 06/05/22.     Medication will be delivered via Same Day Courier to the prescription address in Epic WAM.    Nancy Nordmann [facility] Specialty Technician

## 2022-05-28 NOTE — Unmapped (Signed)
Great news, Kayla Knight! Normal Pap so you will not need another unless you have more bleeding.    Best,  Dr. Rondel Baton

## 2022-06-05 MED FILL — REPATHA SURECLICK 140 MG/ML SUBCUTANEOUS PEN INJECTOR: SUBCUTANEOUS | 84 days supply | Qty: 6 | Fill #1

## 2022-06-10 NOTE — Unmapped (Signed)
Addended by: Deitra Mayo on: 06/09/2022 10:58 PM     Modules accepted: Orders

## 2022-06-14 NOTE — Unmapped (Signed)
Reason for Disposition   General information question, no triage required and triager able to answer question    Answer Assessment - Initial Assessment Questions  1. REASON FOR CALL or QUESTION: What is your reason for calling today? or How can I best help you? or What question do you have that I can help answer?      Patient wanting to know where she can get a COVID test. Triage RN asked if patient had tried a home rapid test yet- patient did not know that rapid tests could detect current strain of COVID. Patient states she does have home rapid tests currently. Triage RN advised patient to try home test now. Patient states she has had a sniffly nose for a few days and has not been exposed to COVID as far as she knows- neighbor who she has not been in contact with just tested positive. Declines triage at this time but advised patient to call back if test comes back positive or if patient develops worsening symptoms.    Protocols used: Information Only Call - No Triage-A-AH

## 2022-06-21 ENCOUNTER — Ambulatory Visit: Disposition: A | Discharge status: Home / Self Care | Payer: MEDICARE

## 2022-06-21 ENCOUNTER — Ambulatory Visit: Admit: 2022-06-21 | Discharge: 2022-06-21 | Disposition: A | Discharge status: Home / Self Care | Payer: MEDICARE

## 2022-06-21 LAB — CBC W/ AUTO DIFF
BASOPHILS ABSOLUTE COUNT: 0.1 10*9/L (ref 0.0–0.1)
BASOPHILS RELATIVE PERCENT: 1.3 %
EOSINOPHILS ABSOLUTE COUNT: 0.2 10*9/L (ref 0.0–0.5)
EOSINOPHILS RELATIVE PERCENT: 2.2 %
HEMATOCRIT: 38.8 % (ref 34.0–44.0)
HEMOGLOBIN: 12.6 g/dL (ref 11.3–14.9)
LYMPHOCYTES ABSOLUTE COUNT: 2.6 10*9/L (ref 1.1–3.6)
LYMPHOCYTES RELATIVE PERCENT: 33.2 %
MEAN CORPUSCULAR HEMOGLOBIN CONC: 32.4 g/dL (ref 32.0–36.0)
MEAN CORPUSCULAR HEMOGLOBIN: 22.8 pg — ABNORMAL LOW (ref 25.9–32.4)
MEAN CORPUSCULAR VOLUME: 70.3 fL — ABNORMAL LOW (ref 77.6–95.7)
MEAN PLATELET VOLUME: 7.1 fL (ref 6.8–10.7)
MONOCYTES ABSOLUTE COUNT: 0.8 10*9/L (ref 0.3–0.8)
MONOCYTES RELATIVE PERCENT: 9.7 %
NEUTROPHILS ABSOLUTE COUNT: 4.3 10*9/L (ref 1.8–7.8)
NEUTROPHILS RELATIVE PERCENT: 53.6 %
PLATELET COUNT: 394 10*9/L (ref 150–450)
RED BLOOD CELL COUNT: 5.52 10*12/L — ABNORMAL HIGH (ref 3.95–5.13)
RED CELL DISTRIBUTION WIDTH: 19.3 % — ABNORMAL HIGH (ref 12.2–15.2)
WBC ADJUSTED: 8 10*9/L (ref 3.6–11.2)

## 2022-06-21 LAB — SLIDE REVIEW

## 2022-06-21 LAB — COMPREHENSIVE METABOLIC PANEL
ALBUMIN: 3.9 g/dL (ref 3.4–5.0)
ALKALINE PHOSPHATASE: 74 U/L (ref 46–116)
ALT (SGPT): 12 U/L (ref 10–49)
ANION GAP: 8 mmol/L (ref 5–14)
AST (SGOT): 21 U/L (ref <=34)
BILIRUBIN TOTAL: 0.5 mg/dL (ref 0.3–1.2)
BLOOD UREA NITROGEN: 21 mg/dL (ref 9–23)
BUN / CREAT RATIO: 17
CALCIUM: 9.5 mg/dL (ref 8.7–10.4)
CHLORIDE: 105 mmol/L (ref 98–107)
CO2: 27 mmol/L (ref 20.0–31.0)
CREATININE: 1.21 mg/dL — ABNORMAL HIGH
EGFR CKD-EPI (2021) FEMALE: 45 mL/min/{1.73_m2} — ABNORMAL LOW (ref >=60)
GLUCOSE RANDOM: 108 mg/dL (ref 70–179)
POTASSIUM: 4.5 mmol/L (ref 3.4–4.8)
PROTEIN TOTAL: 7.7 g/dL (ref 5.7–8.2)
SODIUM: 140 mmol/L (ref 135–145)

## 2022-06-21 LAB — PREGNANCY, URINE: PREGNANCY TEST URINE: NEGATIVE

## 2022-06-21 LAB — HIGH SENSITIVITY TROPONIN I - 2 HOUR SERIAL
HIGH SENSITIVITY TROPONIN - DELTA (0-2H): 1 ng/L (ref <=7)
HIGH-SENSITIVITY TROPONIN I - 2 HOUR: 16 ng/L (ref <=34)

## 2022-06-21 LAB — HIGH SENSITIVITY TROPONIN I - SERIAL: HIGH SENSITIVITY TROPONIN I: 17 ng/L (ref <=34)

## 2022-06-21 MED ADMIN — aluminum-magnesium hydroxide-simethicone (MAALOX MAX) 80-80-8 mg/mL oral suspension: 30 mL | ORAL | @ 10:00:00 | Stop: 2022-06-21

## 2022-06-21 MED ADMIN — sucralfate (CARAFATE) oral suspension: 1 g | ORAL | @ 10:00:00 | Stop: 2022-06-21

## 2022-06-21 NOTE — Unmapped (Signed)
Patient complaining of CP pain /pressure after eating some pork ribs tonight

## 2022-06-21 NOTE — Unmapped (Signed)
[facility]  Emergency Department Provider Note     ED Clinical Impression     Final diagnoses:   Chest pain in adult (Primary)      Impression, Medical Decision Making, ED Course     Impression: 81 y.o. 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 who presents with chest pain x1 day as described below.     DDx/MDM: ACS, PE, dissection, pneumonia, pneumothorax, given the patient's presentation, and work-up that was completed prior to being seen by a physician in triage, these etiologies are less likely, other possible causes include GERD, musculoskeletal pain.  Lab work was significant for mild elevation in her creatinine by 1.21, troponin was negative, chest x-ray without definitive acute intrathoracic pathology.  After discussing with the patient, she is very stable, with nonradiating aching chest pain, it was previously improved by T.  We will treat her with a GI cocktail.  Trend 1 more troponin, I do not think it is appropriate to give her any more aspirin as she has had 2 full doses of aspirin at this point.  If her next troponin is nonelevated, she can be discharged home with follow-up with the chest pain clinic.  Second troponin without elevation.  We will plan to arrange chest pain clinic follow-up with MAO.  Chest pain clinic appointment set up for Tuesday 9/26.      Diagnostic workup as below. Will treat patient with GI cocktail.    Orders Placed This Encounter   Procedures    XR Chest 2 views    CBC w/ Differential    Comprehensive Metabolic Panel    hsTroponin I (serial 0-2-6H w/ delta)    Pregnancy Qualitative, Urine    hsTroponin I - 2 Hour    ECG 12 Lead    ECG 12 Lead    ECG 12 Lead       ED Course as of 06/21/22 0748   Sun Jun 21, 2022   0636 hsTroponin I - 2 Hour:    hsTroponin I 16   delta hsTroponin I 1  Negative delta troponin.   1610 Discussed troponin findings with patient.  Paged [facility] for chest pain clinic follow-up.   9604 Patient made ready for discharge.       Independent Interpretation of Studies: I have independently interpreted the following studies:  Chest x-ray    Discussion of Management With Other Providers or Support Staff: I discussed the management of this patient with the:  None    Escalation of Care including OBS/Admission/Transfer was considered:  Yes, however patient was deemed appropriate for outpatient management.  ____________________________________________    The case was discussed with the attending physician, who is in agreement with the above assessment and plan.      History     Chief Complaint  Chief Complaint   Patient presents with    Chest Pain       HPI   Kayla Knight is a 81 y.o. female with past medical history as below who presents with intermittent chest pain for 1 day.  As per triage the pain started while she was having some short ribs, however on interview with the patient, she does not say specific time when the pain started.  Is a nonradiating aching  pain in the left chest, it was made better by some stomach soothing tea, no noted aggravating factors. Pain is resolved at this time.  She denies shortness of breath, lightheadedness, cough, fever, nausea, vomiting, diarrhea.  Also endorses some neck pain with yawning, however she does not know when this started her its relation to her chest pain.    Outside Historian(s): I have obtained additional history/collateral from none.    External Records Reviewed: I have reviewed none.    Past Medical History:   Diagnosis Date    Anemia     past history, over 40 yrs ago    Arthritis     hand, knees, joints    Asthma     past history, no recent attacks    Asthma     patient stated due to asthmatic medication Cataract 2008    Surgery    Diabetes mellitus (CMS-HCC)     Type II, on 9/15 patient stated she was not longer diabetic, not taking meds    Dry eye     Hearing impairment     Heart murmur 2016    [facility] Verdie [facility]    Heart valve disease     Heart mumur, pt reports diagnosis 10 yrs ago (05/10/20)    History of stent insertion of renal artery 01/29/2020    ASA indefinitely and plavix x 6 months per procedure note    Hypertension     pt reports taking meds since about 1995    Neuromuscular disorder (CMS-HCC) 2014    Feet & Legs    Tear of meniscus of knee     4.5 yrs ago    Urinary incontinence     seen a doctor about nighttime bathroom use (adressed in 2019)       Past Surgical History:   Procedure Laterality Date    CATARACT EXTRACTION W/ INTRAOCULAR LENS IMPLANT Left 02/27/2016    EYE SURGERY  01/30/16    Cataract surgery is scheduled.    JOINT REPLACEMENT  Knee    KNEE CARTILAGE SURGERY Right 2007, 2008    x 2    KNEE SURGERY      pt reports total left knee joint replacement surgery around 2016, 2017    PLANTAR FASCIECTOMY Left     PR REVASCULARIZE FEM/POP ARTERY,ANGIOPLASTY/STENT N/A 01/29/2020    Procedure: Peripheral Angiography W Intervetion;  Surgeon: Alvira Philips, MD;  Location: [facility] CATH;  Service: Cardiology    PR REVASCULARIZE FEM/POP ARTERY,ANGIOPLASTY/STENT Right 12/26/2021    Procedure: right renal artery intervention;  Surgeon: Alvira Philips, MD;  Location: [facility] CATH;  Service: Cardiology    PR UPPER GI ENDOSCOPY,BIOPSY N/A 04/01/2021    Procedure: UGI ENDOSCOPY; WITH BIOPSY, SINGLE OR MULTIPLE;  Surgeon: Jarvis Morgan, MD;  Location: [facility];  Service: Gastroenterology    PR XCAPSL CTRC RMVL INSJ IO LENS PROSTH W/O ECP Right 02/13/2016    Procedure: EXTRACAPSULAR CATARACT REMOVAL W/INSERTION OF INTRAOCULAR LENS PROSTHESIS, MANUAL OR MECHANICAL TECHNIQUE;  Surgeon: Garey Ham, MD;  Location: [facility];  Service: Ophthalmology    PR XCAPSL CTRC RMVL INSJ IO LENS PROSTH W/O ECP Left 02/27/2016    Procedure: EXTRACAPSULAR CATARACT REMOVAL W/INSERTION OF INTRAOCULAR LENS PROSTHESIS, MANUAL OR MECHANICAL TECHNIQUE;  Surgeon: Garey Ham, MD;  Location: [facility] OR [facility];  Service: Ophthalmology    RENAL ARTERY STENT  01/11/2020    ROOT CANAL      TUBAL LIGATION  1973    [facility]    WISDOM TOOTH EXTRACTION           Current Facility-Administered Medications:     aspirin chewable tablet 324 mg, 324 mg, Oral, Once, Otter, Sherryle Lis, MD    Current Outpatient Medications:     amLODIPine (NORVASC) 10 MG tablet, Take 1 tablet (10 mg total) by mouth daily., Disp: 90 tablet, Rfl: 3    clopidogreL (PLAVIX) 75 mg tablet, Take 1 tablet (75 mg total) by mouth daily., Disp: 90 tablet, Rfl: 3    empty container (SHARPS-A-GATOR DISPOSAL SYSTEM) Misc, Use as directed for sharps disposal, Disp: 1 each, Rfl: 2    evolocumab (REPATHA SURECLICK) 140 mg/mL PnIj, Inject the contents of 1 pen (140 mg) under the skin every fourteen (14) days., Disp: 6 mL, Rfl: 3    pantoprazole (PROTONIX) 40 MG tablet, , Disp: , Rfl:     pantoprazole (PROTONIX) 40 MG tablet, Take 1 tablet (40 mg total) by mouth daily., Disp: 90 tablet, Rfl: 3    spironolactone (ALDACTONE) 50 MG tablet, Take 1 tablet (50 mg total) by mouth daily., Disp: 90 tablet, Rfl: 3    Allergies  Penicillins, Lisinopril, Statins-hmg-coa reductase inhibitors, and Sulfa (sulfonamide antibiotics)    Family History  Family History   Problem Relation Age of Onset    Heart disease Mother     Hypertension Mother     Dementia Mother     Arthritis Mother     Heart disease Father     Thyroid disease Father     Glaucoma Father     Alcohol abuse Father     Heart attack Father 64    Arthritis Father     Hypertension Father         Massive heart attack    Arthritis Sister     Glaucoma Sister     Hypertension Sister     Diabetes Sister     Diabetes Daughter     Diabetes Paternal Aunt     No Known Problems Brother     No Known Problems Maternal Aunt     No Known Problems Maternal Uncle     No Known Problems Paternal Uncle     No Known Problems Maternal Grandmother     No Known Problems Maternal Grandfather     No Known Problems Paternal Grandmother     No Known Problems Paternal Grandfather     Arthritis Sister         Noticeable in extremities    COPD Sister     Depression Sister     Hypertension Sister     Glaucoma Sister     Asthma Daughter         Ocassional    Hypertension Daughter     Miscarriages / [place] Daughter     Cancer Maternal Aunt         Died more than 86yrs ago    Early death Maternal Aunt     Diabetes Paternal Aunt         4 Aunts    Kidney disease Paternal Aunt     Amblyopia Neg Hx     Blindness Neg Hx     Cancer Neg Hx     Cataracts Neg Hx     Macular degeneration Neg Hx     Retinal detachment Neg Hx     Strabismus Neg Hx     Stroke Neg Hx  Social History  Social History     Tobacco Use    Smoking status: Former     Packs/day: 0.00     Years: 0.00     Additional pack years: 0.00     Total pack years: 0.00     Types: Cigarettes     Quit date: 05/19/1984     Years since quitting: 38.1    Smokeless tobacco: Never   Vaping Use    Vaping Use: Never used   Substance Use Topics    Alcohol use: Not Currently    Drug use: Never        Physical Exam     VITAL SIGNS:      Vitals:    06/20/22 2332 06/21/22 0240 06/21/22 0656   BP: 185/78 182/87 178/84   Pulse: 60 62 65   Resp: 18 17 18    Temp: 36.5 ??C (97.7 ??F) 36.5 ??C (97.7 ??F)    TempSrc: Oral Oral    SpO2: 98% 99% 100%       Constitutional: Alert and oriented. No acute distress.  Eyes: Conjunctivae are normal.  HEENT: Normocephalic and atraumatic. Conjunctivae clear. No congestion.    Cardiovascular: Rate as above, regular rhythm. Normal and symmetric distal pulses.   Respiratory: Normal respiratory effort. Breath sounds are normal. There are no wheezing or crackles heard.  Gastrointestinal: Soft, non-distended, non-tender. No rebound or guarding.  Genitourinary: Deferred.  Musculoskeletal: Non-tender with normal range of motion in all extremities.   Neurologic: Normal speech and language. No gross focal neurologic deficits are appreciated. Patient is moving all extremities equally, face is symmetric at rest and with speech.  Skin: Skin is warm, dry and intact. No rash noted.  Psychiatric: Mood and affect are normal. Speech and behavior are normal.     Radiology     XR Chest 2 views   Preliminary Result      No acute cardiopulmonary abnormality.          Pertinent labs & imaging results that were available during my care of the patient were independently interpreted by me and considered in my medical decision making (see chart for details).    Portions of this record have been created using Scientist, clinical (histocompatibility and immunogenetics). Dictation errors have been sought, but may not have been identified and corrected.       Alan Ripper, DO  Resident  06/21/22 [phone]

## 2022-06-23 ENCOUNTER — Ambulatory Visit: Admit: 2022-06-23 | Discharge: 2022-06-24 | Payer: MEDICARE

## 2022-06-23 NOTE — Unmapped (Signed)
Your provider requested the following test(s) for you during your visit today. Please call the numbers listed below to schedule your test(s):    Type of Study Phone Number Required   Cardiac Services       (Treadmill test, Zio application, EKG)       (Specify location for testing with scheduler) [phone] ?   Echo       (includes Stress Echo) [phone] ?   [facility]: [phone]  [phone] ?   Radiology       (MRI/CT/X-Ray) [phone] ?   PET Scan / Nuclear Medicine Testing [phone] X   Peripheral Vascular Lab (PVL) [phone] ?   Sleep Studies at [facility] [phone] ?   Sleep Studies at [facility] [employer]       (Locations in [place] and [place]) [phone] ?       If you are unsuccessful at scheduling your appointment and need further assistance, please call our clinic at [phone] (option 3). One of our staff members can further assist you.

## 2022-06-23 NOTE — Unmapped (Unsigned)
Patient is here today for evaluation of chest pain. She reports that on Saturday, she had an episode that lasted ~12 hours. She was not able to sleep due to pain. She notes she has had chest pain in the past but never an episode that last that long (usually around an hour or so).   Takes Plavix- had stent placed in renal artery in 2021.

## 2022-06-24 NOTE — Unmapped (Signed)
[facility]  Emergency Department Chest Pain Transition Clinic    Assessment/Plan:   1. CP. Reassuring EKG and troponins. We discussed watchful waiting vs ischemic evaluation -- she prefers the latter, which is very reasonable. Will proceed with NM stress in the near future.    Return to clinic: RTC with Drs. Azu/Weickert    I personally spent 35 minutes face-to-face and non-face-to-face in the care of this patient, which includes all pre, intra, and post visit time on the date of service.    Kathreen Devoid Verdis Frederickson, MD  Assistant Professor of Medicine  [facility] Division of Cardiology    Subjective:   Referring: Desma Mcgregor   PCP: Virgina Evener, MD  Patient ID: Kayla Knight is an 81 y.o. female patient with resistant HTN, HLD, endometriosis, and DM2, here for evaluation of CP.    History of Present Illness:  Pt was seen in the [facility] ED on 06/21/2022 with intermittent CP x 1 day. Described as central/left, nonradiating, not associated with activity, exertion, position, or food. She came to the ED and had full evaluation with labs and EKG which were reassuring. She was discharged to follow up in our clinic. She has not had recurrence since then. 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    Pt sees my colleague Dr. Paulino Rily in clinic -- last seen on 05/11/2022. She has had multiple peripheral angiograms and interventions with Dr. Jaymes Graff but never a coronary angiogram.    Heart Score:     History:    1 = Moderately Suspicious    EKG:       1 = Non-specific repolarization disturbance   Age:    2 = 65 years and older    Risk Factors:    1 = 1-2 risk factors   Troponin:   0 = Normal limit or less   HEART Score Total: 5     Past Medical History:   Diagnosis Date    Anemia     past history, over 40 yrs ago    Arthritis     hand, knees, joints    Asthma     past history, no recent attacks    Asthma     patient stated due to asthmatic medication    Cataract 2008    Surgery    Diabetes mellitus (CMS-HCC)     Type II, on 9/15 patient stated she was not longer diabetic, not taking meds    Dry eye     Hearing impairment     Heart murmur 2016    [facility] Verdie [facility]ire [facility]    Heart valve disease     Heart mumur, pt reports diagnosis 10 yrs ago (05/10/20)    History of stent insertion of renal artery 01/29/2020    ASA indefinitely and plavix x 6 months per procedure note    Hypertension     pt reports taking meds since about 1995    Neuromuscular disorder (CMS-HCC) 2014    Feet & Legs    Tear of meniscus of knee     4.5 yrs ago    Urinary incontinence     seen a doctor about nighttime bathroom use (adressed in 2019)     Past Surgical History:   Procedure Laterality Date    CATARACT EXTRACTION W/ INTRAOCULAR LENS IMPLANT Left 02/27/2016    EYE SURGERY  01/30/16    Cataract surgery is scheduled.    JOINT REPLACEMENT  Knee    KNEE CARTILAGE SURGERY Right 2007, 2008    x 2    KNEE SURGERY      pt reports total left knee joint replacement surgery around 2016, 2017    PLANTAR FASCIECTOMY Left     PR REVASCULARIZE FEM/POP ARTERY,ANGIOPLASTY/STENT N/A 01/29/2020    Procedure: Peripheral Angiography W Intervetion;  Surgeon: Alvira Philips, MD;  Location: [facility] CATH;  Service: Cardiology    PR REVASCULARIZE FEM/POP ARTERY,ANGIOPLASTY/STENT Right 12/26/2021    Procedure: right renal artery intervention;  Surgeon: Alvira Philips, MD;  Location: [facility] CATH;  Service: Cardiology    PR UPPER GI ENDOSCOPY,BIOPSY N/A 04/01/2021    Procedure: UGI ENDOSCOPY; WITH BIOPSY, SINGLE OR MULTIPLE;  Surgeon: Jarvis Morgan, MD;  Location: [facility]ED[facility];  Service: Gastroenterology    PR XCAPSL CTRC RMVL INSJ IO LENS PROSTH W/O ECP Right 02/13/2016    Procedure: EXTRACAPSULAR CATARACT REMOVAL W/INSERTION OF INTRAOCULAR LENS PROSTHESIS, MANUAL OR MECHANICAL TECHNIQUE;  Surgeon: Garey Ham, MD;  Location: [facility];  Service: Ophthalmology    PR XCAPSL CTRC RMVL INSJ IO LENS PROSTH W/O ECP Left 02/27/2016    Procedure: EXTRACAPSULAR CATARACT REMOVAL W/INSERTION OF INTRAOCULAR LENS PROSTHESIS, MANUAL OR MECHANICAL TECHNIQUE;  Surgeon: Garey Ham, MD;  Location: [facility] OR [facility];  Service: Ophthalmology    RENAL ARTERY STENT  01/11/2020    ROOT CANAL      TUBAL LIGATION  1973    [facility]    WISDOM TOOTH EXTRACTION       Social History     Socioeconomic History    Marital status: Single     Spouse name: None    Number of children: None    Years of education: None    Highest education level: None   Tobacco Use    Smoking status: Former     Packs/day: 0.00     Years: 0.00     Additional pack years: 0.00     Total pack years: 0.00     Types: Cigarettes     Quit date: 05/19/1984     Years since quitting: 38.1    Smokeless tobacco: Never   Vaping Use    Vaping Use: Never used   Substance and Sexual Activity    Alcohol use: Not Currently    Drug use: Never    Sexual activity: Not Currently     Partners: Male   Social History Narrative    Exercise: water aerobics 3-4 x week for an hour        Eye: Dr.Merrit @ [facility] 2018        Dentist: [facility] 2018     Social Determinants of Health     Financial Resource Strain: Medium Risk (03/08/2022)    Overall Financial Resource Strain (CARDIA)     Difficulty of Paying Living Expenses: Somewhat hard   Food Insecurity: No Food Insecurity (03/08/2022)    Hunger Vital Sign     Worried About Running Out of Food in the Last Year: Never true     Ran Out of Food in the Last Year: Never true   Transportation Needs: No Transportation Needs (03/08/2022)    PRAPARE - Therapist, art (Medical): No     Lack of Transportation (Non-Medical): No     Family History   Problem Relation Age of Onset    Heart disease Mother     Hypertension Mother  Dementia Mother     Arthritis Mother     Heart disease Father     Thyroid disease Father     Glaucoma Father     Alcohol abuse Father     Heart attack Father 38    Arthritis Father Hypertension Father         Massive heart attack    Arthritis Sister     Glaucoma Sister     Hypertension Sister     Diabetes Sister     Diabetes Daughter     Diabetes Paternal Aunt     No Known Problems Brother     No Known Problems Maternal Aunt     No Known Problems Maternal Uncle     No Known Problems Paternal Uncle     No Known Problems Maternal Grandmother     No Known Problems Maternal Grandfather     No Known Problems Paternal Grandmother     No Known Problems Paternal Grandfather     Arthritis Sister         Noticeable in extremities    COPD Sister     Depression Sister     Hypertension Sister     Glaucoma Sister     Asthma Daughter         Ocassional    Hypertension Daughter     Miscarriages / [place] Daughter     Cancer Maternal Aunt         Died more than 38yrs ago    Early death Maternal Aunt     Diabetes Paternal Aunt         4 Aunts    Kidney disease Paternal Aunt     Amblyopia Neg Hx     Blindness Neg Hx     Cancer Neg Hx     Cataracts Neg Hx     Macular degeneration Neg Hx     Retinal detachment Neg Hx     Strabismus Neg Hx     Stroke Neg Hx      Current Outpatient Medications   Medication Sig Dispense Refill    amLODIPine (NORVASC) 10 MG tablet Take 1 tablet (10 mg total) by mouth daily. 90 tablet 3    clopidogreL (PLAVIX) 75 mg tablet Take 1 tablet (75 mg total) by mouth daily. 90 tablet 3    empty container (SHARPS-A-GATOR DISPOSAL SYSTEM) Misc Use as directed for sharps disposal 1 each 2    evolocumab (REPATHA SURECLICK) 140 mg/mL PnIj Inject the contents of 1 pen (140 mg) under the skin every fourteen (14) days. 6 mL 3    pantoprazole (PROTONIX) 40 MG tablet Take 1 tablet (40 mg total) by mouth daily. (Patient taking differently: Take 1 tablet (40 mg total) by mouth daily. [place] aneeded) 90 tablet 3    spironolactone (ALDACTONE) 50 MG tablet Take 1 tablet (50 mg total) by mouth daily. 90 tablet 3     No current facility-administered medications for this visit.     Allergies: Penicillins, Lisinopril, Statins-hmg-coa reductase inhibitors, and Sulfa (sulfonamide antibiotics)    ROS: 12-system review is negative other than what is specified in the HPI.    Objective:   Physical Exam:  BP 144/70 (BP Site: L Arm, BP Position: Sitting, BP Cuff Size: Medium)  - Pulse 78  - Temp 35.6 ??C (96 ??F) (Temporal)  - Ht 170.2 cm (5' 7.01)  - Wt 72 kg (158 lb 12.8 oz)  - SpO2 100%  - BMI 24.87 kg/m??  GEN: adult female patient in NAD  HEENT: NCAT, sclerae anicteric, OP clear  NECK: JVP flat  CARD: RRR   S1/S2 audible   no murmurs, rubs, or gallops  RESP: CTAB, no wheezes, crackles, or ronchi, normal work of breathing  ABDO: soft, NT/ND  EXTREM: WWP, PPP, no LE edema  NEURO: AAO, CN II-XII grossly normal, moving all 4 extremities  SKIN: warm and dry    Notable results:  I have personally reviewed the images of the following diagnostic studies.  Labs:   Lab Results   Component Value Date    WBC 8.0 06/21/2022    HGB 12.6 06/21/2022    HCT 38.8 06/21/2022    PLT 394 06/21/2022     Lab Results   Component Value Date    NA 140 06/21/2022    K 4.5 06/21/2022    CREATININE 1.21 (H) 06/21/2022      Lab Results   Component Value Date    CHOL 161 03/16/2022    LDL 93 03/16/2022    HDL 59 03/16/2022    TRIG 45 03/16/2022     Lab Results   Component Value Date    A1C 5.7 03/16/2022      Lab Results   Component Value Date    TROPONINI 16 06/21/2022    TROPONINI 17 06/21/2022    TROPONINI <0.034 02/17/2020     Imaging/Other:  EKG: NSR with 1st degree AVB, LAD, nonspecific ST/T changes, LVH by voltage criteria    TTE 01/05/2022:  1. The left ventricle is normal in size with normal wall thickness.  2. The left ventricular systolic function is normal, LVEF is visually estimated at > 55%.  3. There is mild mitral valve regurgitation.  4. There is mild to moderate aortic regurgitation.  5. The right ventricle is normal in size, with normal systolic function.    The ASCVD Risk score (Arnett DK, et al., 2019) failed to calculate.

## 2022-07-06 ENCOUNTER — Ambulatory Visit: Admit: 2022-07-06 | Discharge: 2022-07-07 | Payer: MEDICARE

## 2022-07-06 ENCOUNTER — Ambulatory Visit: Admit: 2022-07-06 | Discharge: 2022-07-14 | Payer: MEDICARE

## 2022-07-06 ENCOUNTER — Ambulatory Visit: Admit: 2022-07-06 | Payer: MEDICARE

## 2022-07-07 ENCOUNTER — Ambulatory Visit
Admit: 2022-07-07 | Discharge: 2022-08-05 | Payer: MEDICARE | Attending: Rehabilitative and Restorative Service Providers" | Primary: Rehabilitative and Restorative Service Providers"

## 2022-07-07 ENCOUNTER — Ambulatory Visit
Admit: 2022-07-07 | Payer: MEDICARE | Attending: Rehabilitative and Restorative Service Providers" | Primary: Rehabilitative and Restorative Service Providers"

## 2022-07-07 NOTE — Unmapped (Signed)
[facility] [place]  OUTPATIENT PHYSICAL THERAPY  07/07/2022  Note Type: Evaluation       Patient Name: Kayla Knight  Date of Birth:1940-12-21  Diagnosis:   Encounter Diagnoses   Name Primary?    Weakness of both hips     Right hip pain Yes    Left hip pain      Referring Provider: Rudene Anda    Date of Onset of Impairment: 07/07/2021  Date PT Care Plan Established or Reviewed: 07/07/2022  Date PT Treatment Started: 07/07/2022     Plan of Care Effective Date: 07/07/2022 - 10/04/2022  Session Number:  1    ASSESSMENT & PLAN   Assessment  Assessment details:      Kayla Knight is a pleasant 81 y.o. female who presents for Physical Therapy Evaluation with Chronic B hip pain due to no specific MOI. Primary impairments include difficulty standing > 10 min, difficulty ambulating > 10 min, difficulty with stairs, and difficulty with household chores. Clinical presentation today is consistent with Chronic hip pain due to weakness. No imaging completed. Hx of uncontrolled HTN and renal artery stenosis. The patient will benefit from skilled Physical Therapy intervention to address the moderate impairments listed below and to assist the patient in maximizing her functional independence and safe return to prior level of function.             Impairments: decreased endurance, pain, decreased strength, decreased range of motion and impaired motor control      Personal Factors/Comorbidities: 3+    Specific Comorbidities: HTN, Cardiac, Renal artery stenosis    Examination of Body Systems: musculoskeletal, activity/participation and communication    Clinical Presentation: stable    Clinical Decision Making: low    Prognosis: good prognosis    Positive Prognosis Rationale: motivated for treatment.  Negative Prognosis Rationale: medical status/condition, chronicity of condition, severity of symptoms, ADL performance, endurance and strength.      Therapy Goals      Goals:        1. In 12 weeks the patient will demonstrate independent performance of HEP to maintain functional gains.   2. In 12 weeks: Pt. will have improved Lower extremity strength of 4+/5 in order to perform her house chores and ambulate in the community safely.  3. In 12 weeks: pt. will be able to ambulate for 30 min without needing to sit down to relieve pain in the hip to improve her abilities ambulate in community  4. Pt will be able to stand with min difficulty after sitting for 40 min within 12 weeks.        Plan    Therapy options: will be seen for skilled physical therapy services    Planned therapy interventions: Aquatic Therapy, Location manager, Education - Patient, Endurance Activites, Functional Mobility, Gait Training, Home Exercise Program, Manual Therapy, Neuromuscular Re-education, Therapeutic Activities, Therapeutic Exercises, Education - Family/Caregiver, Dry Needling, E-Stim, TENS, Self-Care/Home Training, Civil engineer, contracting and Diaphragmatic/Pursed-lip Breathing    DME Equipment: [employer].    Frequency: 1x week    Duration in weeks: 12    Education provided to: patient.    Education provided: HEP, Treatment options and plan, Symptom management, Safety education, Importance of Therapy, Anatomy, Body mechanics, Role of therapy in Rehabilitation, Posture, Community resources and Body awareness    Education results: verbalized good understanding, demonstrates understanding and needs reinforcement.    Communication/Consultation: Medicare Cert/POC sent to Referring Provider.      Total Session Time: 45  Treatment rendered today:      45        SUBJECTIVE   Interpreter Use: Not applicable    History of Present Condition      History of Present Condition/Chief Complaint:       Associated Diagnoses    Weakness of both hips [R29.898]  - Primary      Date of Onset:  07/07/2021  Subjective:     B hip pain ongoing about year with no specific MOI. Pain starts after standing for > 15 minutes. During covid was walking about 5 miles a day but now not walking as much as she was. Sitting a lot while sewing and on the computer. No pain with ambulating stairs. Pain in Left flank.    Pain  Current pain rating: 2  At best pain rating: 0  At worst pain rating: 5  Location: B hips  Quality: aching  Relieving factors: rest  Aggravating factors: performance of leg dominant activites, standing, stairs, walking and rising from sitting  Pain Related Behaviors: avoidance  Progression: no change  Red flags: none.    Precautions and Equipment  Precautions: Cardiac  Current Braces/Orthoses: None  Equipment Currently Used: None  Prior Functional Status:     Functional Limitation(s)-No physical limitations  Current functional status: limited lifting, limited standing tolerance, limited travel, limited exercise, limited household activities and limited walking tolerance  Social Support  Patient lives at: Senior living.    Barriers to Learning: No Barriers    Diagnostic Tests  No diagnostic tests performed        Treatments  Previous treatment: medication  Current treatment: physical therapy      Patient Goals  Patient goals for therapy: improved ambulation, improved balance, increased ROM, increased strength, independence with ADLs/IADLs and decreased pain  Patient goal: Be able to completing all cleaning without pain at end of day      Hunger vital sign:  1. Within the past 12 months, we worried whether our food would run out before we got money to buy more. Never True  2. Within the past 12 months, the food we bought just didn't last, and we didn't have money to get more. Never True    If patient identifies with either of the above, patient was provided with local food resource guide and non perishables when possible.    OBJECTIVE     Outcome Measure: Revised Oswestry: 7/50    Posture/Observation:   Sitting: slouched  Correction of Posture: decreased symptoms  Standing: unremarkable  Gait: non-antalgic    Lumbar Spine ROM  Motion No ROM Loss  (0%) Min ROM Loss  (1-33%) Mod ROM Loss  (34-65%) Major ROM Loss  (66-100%) Symptoms    Flexion  x      Extension x       Sideglide Right  x      Sideglide Left  x        Special tests:   Right Left   Slump - -   SLR - -   Babinski - -   SIJ cluster: - -   Femoral nerve - -       Palpation: TTP: Left lumbar paraspinals          Gait Analysis:  non-antalgic        Stairs: nonreciprocal when flared up  Sit to Stand: Slight weight shift towards RLE  TUG: NT  5x STS: 17.77 sec    Balance: SLS   - Right:  NT seconds  - Left: NT seconds    Hip ROM   Motion Right AROM  Left AROM   Flexion 110 119   ER      IR        Strength  Muscle Right Left   Hip Flexion 4/5 4/5   Knee extension 4/5 4/5   Knee flexion 4/5 4/5   Hip extension 4/5 4/5   Hip abduction 4/5 4/5     Special tests:       Right Left   FADIR Negative Negative   FABER Negative Negative   Scour Negative Negative   External De-Rotation test Not tested Not tested   Trendelenburg Not tested Not tested   Elys Not tested Not tested   Maisie Fus Not tested Not tested   Claiborne Rigg Not tested Not tested             TREATMENT RENDERED   Physical Therapy Evaluation - Mod Complexity    Therapeutic Exercise:  15 Minutes   Performed with direct PT demonstration, instruction, supervision, and guidance.   - Education on condition, prognosis, and PT POC  - Bridges 2x10  - SLR 2x8  - LTR 10x      HEP Access Code: [email or web address]  Next Visit Plan: Progress lumbar mobility and hip strengthening, TUG, hip abduction/extension      Total Treatment Time + Evaluation: 45 Minutes  PT Evaluation Charges  $$ PT Evaluation - MOD Complexity [mins]: 30     Therapeutic Interventions Charges  $$ Therapeutic Exercise [mins]: 15                 I attest that I have reviewed the above information.  Signed: Army Chaco, PT, DPT  07/07/2022 12:18 PM        I reviewed the no-show/attendance policy with the patient and caregiver(s). The patient is aware that they must call to cancel appointments more than 24 hours in advance. They are also aware that if they late cancel or no-show three times, we reserve the right to cancel their remaining appointments. This policy is in place to allow [place] to best serve the needs of our caseload.    If patient returns to clinic with variance in plan of care, then it may be attributable to one or more of the following factors: preferred clinician availability, appointment time request availability, therapy pool appointment availability, major holiday with clinic closure, caregiver availability, patient transportation, conflicting medical appointment, inclement weather, and/or patient illness.    If patient does not return for follow up visit(s) related to this episode of care, this note will serve as their discharge note from Physical Therapy.

## 2022-07-13 ENCOUNTER — Ambulatory Visit: Admit: 2022-07-13 | Discharge: 2022-07-14 | Payer: MEDICARE

## 2022-07-13 DIAGNOSIS — R079 Chest pain, unspecified: Principal | ICD-10-CM

## 2022-07-13 MED ADMIN — Tc-99m Sestamibi (Cardiolite): 31.7 | INTRAVENOUS | @ 15:00:00 | Stop: 2022-07-13

## 2022-07-13 MED ADMIN — Tc-99m Sestamibi (Cardiolite): 11 | INTRAVENOUS | @ 13:00:00 | Stop: 2022-07-13

## 2022-07-13 MED ADMIN — regadenoson (LEXISCAN) injection: .4 mg | INTRAVENOUS | @ 13:00:00 | Stop: 2022-07-13

## 2022-07-15 NOTE — Unmapped (Signed)
[facility] [place]  OUTPATIENT PHYSICAL THERAPY  07/15/2022  Note Type: Treatment Note       Patient Name: Kayla Knight  Date of Birth:1941/09/05  Diagnosis:   Encounter Diagnoses   Name Primary?    Weakness of both hips     Right hip pain Yes    Left hip pain      Referring Provider: Rudene Anda    Date of Onset of Impairment: 07/07/2021  Date PT Care Plan Established or Reviewed: 07/07/2022  Date PT Treatment Started: 07/07/2022     Plan of Care Effective Date: 07/07/2022 - 10/04/2022  Session Number:  2    ASSESSMENT & PLAN   Assessment  Assessment details:    Today: Kayla Knight is a pleasant 81 y.o. female who presents for Physical Therapy Evaluation with Chronic B hip pain due to no specific MOI. Patient challenged with sit to stands requiring verbal cueing for forward trunk lean due to intermittent LOB due to COM being behind BOS. The patient will benefit from skilled Physical Therapy intervention to address the moderate impairments listed below and to assist the patient in maximizing her functional independence and safe return to prior level of function.    Evaluation: Kayla Knight is a pleasant 81 y.o. female who presents for Physical Therapy Evaluation with Chronic B hip pain due to no specific MOI. Primary impairments include difficulty standing > 10 min, difficulty ambulating > 10 min, difficulty with stairs, and difficulty with household chores. Clinical presentation today is consistent with Chronic hip pain due to weakness. No imaging completed. Hx of uncontrolled HTN and renal artery stenosis. The patient will benefit from skilled Physical Therapy intervention to address the moderate impairments listed below and to assist the patient in maximizing her functional independence and safe return to prior level of function.             Impairments: decreased endurance, pain, decreased strength, decreased range of motion and impaired motor control      Personal Factors/Comorbidities: 3+    Specific Comorbidities: HTN, Cardiac, Renal artery stenosis    Examination of Body Systems: musculoskeletal, activity/participation and communication    Clinical Presentation: stable    Clinical Decision Making: low    Prognosis: good prognosis    Positive Prognosis Rationale: motivated for treatment.  Negative Prognosis Rationale: medical status/condition, chronicity of condition, severity of symptoms, ADL performance, endurance and strength.      Therapy Goals      Goals:        1. In 12 weeks the patient will demonstrate independent performance of HEP to maintain functional gains.   2. In 12 weeks: Pt. will have improved Lower extremity strength of 4+/5 in order to perform her house chores and ambulate in the community safely.  3. In 12 weeks: pt. will be able to ambulate for 30 min without needing to sit down to relieve pain in the hip to improve her abilities ambulate in community  4. Pt will be able to stand with min difficulty after sitting for 40 min within 12 weeks.        Plan    Therapy options: will be seen for skilled physical therapy services    Planned therapy interventions: Aquatic Therapy, Location manager, Education - Patient, Endurance Activites, Functional Mobility, Gait Training, Home Exercise Program, Manual Therapy, Neuromuscular Re-education, Therapeutic Activities, Therapeutic Exercises, Education - Family/Caregiver, Dry Needling, E-Stim, TENS, Self-Care/Home Training, Civil engineer, contracting and Diaphragmatic/Pursed-lip Breathing    DME Equipment: [employer].  Frequency: 1x week    Duration in weeks: 12    Education provided to: patient.    Education provided: HEP, Treatment options and plan, Symptom management, Safety education, Importance of Therapy, Anatomy, Body mechanics, Role of therapy in Rehabilitation, Posture, Community resources and Body awareness    Education results: verbalized good understanding, demonstrates understanding and needs reinforcement.    Communication/Consultation: Medicare Cert/POC sent to Referring Provider.      Total Session Time: 40    Treatment rendered today:      40        SUBJECTIVE   Interpreter Use: Not applicable    History of Present Condition      History of Present Condition/Chief Complaint:       Associated Diagnoses    Weakness of both hips [R29.898]  - Primary    B hip pain ongoing about year with no specific MOI. Pain starts after standing for > 15 minutes. During covid was walking about 5 miles a day but now not walking as much as she was. Sitting a lot while sewing and on the computer. No pain with ambulating stairs. Pain in Left flank.      Date of Onset:  07/07/2021  Subjective:     Feeling good. No flare ups since last here. Keeping up with HEP but has not been walking as much    Pain  Current pain rating: 0  At best pain rating: 0  At worst pain rating: 5  Location: B hips  Quality: aching  Relieving factors: rest  Aggravating factors: performance of leg dominant activites, standing, stairs, walking and rising from sitting  Pain Related Behaviors: avoidance  Progression: no change  Red flags: none.    Precautions and Equipment  Precautions: Cardiac  Current Braces/Orthoses: None  Equipment Currently Used: None  Prior Functional Status:     Functional Limitation(s)-No physical limitations  Current functional status: limited lifting, limited standing tolerance, limited travel, limited exercise, limited household activities and limited walking tolerance  Social Support  Patient lives at: Senior living.    Barriers to Learning: No Barriers    Diagnostic Tests  No diagnostic tests performed        Treatments  Previous treatment: medication  Current treatment: physical therapy      Patient Goals  Patient goals for therapy: improved ambulation, improved balance, increased ROM, increased strength, independence with ADLs/IADLs and decreased pain  Patient goal: Be able to completing all cleaning without pain at end of day      Hunger vital sign:  1. Within the past 12 months, we worried whether our food would run out before we got money to buy more. Never True  2. Within the past 12 months, the food we bought just didn't last, and we didn't have money to get more. Never True    If patient identifies with either of the above, patient was provided with local food resource guide and non perishables when possible.    OBJECTIVE     Outcome Measure: Revised Oswestry: 7/50    Posture/Observation:   Sitting: slouched  Correction of Posture: decreased symptoms  Standing: unremarkable  Gait: non-antalgic    Lumbar Spine ROM  Motion No ROM Loss  (0%) Min ROM Loss  (1-33%) Mod ROM Loss  (34-65%) Major ROM Loss  (66-100%) Symptoms    Flexion  x      Extension x       Sideglide Right  x      Sideglide  Left  x        Special tests:   Right Left   Slump - -   SLR - -   Babinski - -   SIJ cluster: - -   Femoral nerve - -       Palpation: TTP: Left lumbar paraspinals          Gait Analysis:  non-antalgic        Stairs: nonreciprocal when flared up  Sit to Stand: Slight weight shift towards RLE  TUG: NT  5x STS: 17.77 sec  : 460 feet.    Balance: SLS   - Right: 17.87  seconds  - Left: 13.51  seconds    Hip ROM   Motion Right AROM  Left AROM   Flexion 110 119   ER  30 32   IR  32 34     Strength  Muscle Right Left   Hip Flexion 4/5 4/5   Knee extension 4/5 4/5   Knee flexion 4/5 4/5   Hip extension 4/5 4/5   Hip abduction 4/5 4/5     Special tests:       Right Left   FADIR Negative Negative   FABER Negative Negative   Scour Negative Negative   External De-Rotation test Not tested Not tested   Trendelenburg Not tested Not tested   Elys Not tested Not tested   Maisie Fus Not tested Not tested   Claiborne Rigg Not tested Not tested             TREATMENT RENDERED       Therapeutic Exercise:  40 Minutes   Performed with direct PT demonstration, instruction, supervision, and guidance.   - Education on condition, prognosis, and PT POC  - Bridges with march 2x8 (+HEP)  - LTR 10x  - Recumbent Bike Seat 15  - Standing hip abduction/extension 2x10 (+HEP)  - STS from chair 3x8 (+HEP)  - SL Bridge 2x8 (+HEP)        HEP Access Code: [email or web address]  Next Visit Plan: Progress lumbar mobility and hip strengthening, TUG, Monster walks/lateral band walks      Total Treatment Time : 40 Minutes        Therapeutic Interventions Charges  $$ Therapeutic Exercise [mins]: 40                 I attest that I have reviewed the above information.  Signed: Army Chaco, PT, DPT  07/15/2022 4:54 PM        I reviewed the no-show/attendance policy with the patient and caregiver(s). The patient is aware that they must call to cancel appointments more than 24 hours in advance. They are also aware that if they late cancel or no-show three times, we reserve the right to cancel their remaining appointments. This policy is in place to allow [place] to best serve the needs of our caseload.    If patient returns to clinic with variance in plan of care, then it may be attributable to one or more of the following factors: preferred clinician availability, appointment time request availability, therapy pool appointment availability, major holiday with clinic closure, caregiver availability, patient transportation, conflicting medical appointment, inclement weather, and/or patient illness.    If patient does not return for follow up visit(s) related to this episode of care, this note will serve as their discharge note from Physical Therapy.

## 2022-07-16 ENCOUNTER — Ambulatory Visit: Admit: 2022-07-16 | Discharge: 2022-07-17 | Payer: MEDICARE | Attending: Internal Medicine | Primary: Internal Medicine

## 2022-07-16 DIAGNOSIS — R142 Eructation: Principal | ICD-10-CM

## 2022-07-16 DIAGNOSIS — K219 Gastro-esophageal reflux disease without esophagitis: Principal | ICD-10-CM

## 2022-07-16 DIAGNOSIS — R1013 Epigastric pain: Principal | ICD-10-CM

## 2022-07-16 DIAGNOSIS — Z87898 Personal history of other specified conditions: Principal | ICD-10-CM

## 2022-07-16 NOTE — Unmapped (Signed)
Ok for periodic use pantoprazole  Ok for [employer] for peppermint-IBGuard or [employer] for gaviscon  Trial wedge pillow to sleep on

## 2022-07-16 NOTE — Unmapped (Addendum)
Assessment and Plan  1. Gastroesophageal reflux disease, unspecified whether esophagitis present -ok for regular use ppi. Ok for antacids if helpful.possibly gaviscon.  Ok for [employer] for peppermint-IBGuard or [employer] for [employer] wedge pillow to sleep on   2. Dyspepsia    3. Burping -check sibo breath test   4. History of chest pain -neg cardia eval  Consider esoph manometry     Orders Placed This Encounter   Procedures    Hydrogen Breath test (specify SIBO vs. lactose)       Return in about 6 months (around 01/15/2023).  Signs and symptoms that should prompt return sooner than scheduled were reviewed with the patient.  Visit time: In excess of  30  minutes, more than 50% of which was spent face-to-face in counseling and education activities regarding the diagnoses above.     HPI    Chief Complaint  Chief Complaint   Patient presents with    Follow-up     Reflux not improving      Jakita Wire is here for continued problem w reflux. Episode of chest pain w different sx 7/23. Eval in ED w/o cardiac injury noted. Nuclear stress nml 9/23. Has difficulty at night when lays down w burping,burning,acid coming up -hard to go to sleep. Does not raise hob. Does not eat for several hours prior to be(evening meal 4pm,bed 07/09/11). No particular food but does enjoy foods w varied spices. Noted eructation. Discomfort on intake sheet noted LLQ. If takes pantoprazole has relief and is able to go to sleep. Does not like to take meds so only uses 3-4x/wk. No dysphagia. No melena. BM daily,can be 3-4x/d.   7/22 egd re dyspepsia,gerd,bloating- Mild esophagitis,small hiatal hernia,mild antral gastritis. Biopsies mild irritation at edge of esophagus,unremarkabl stomach and duodenum,no H pylori or Celiac sprue.     Allergies:  Penicillins, Lisinopril, Statins-hmg-coa reductase inhibitors, and Sulfa (sulfonamide antibiotics)    Medications:   Outpatient Medications Prior to Visit   Medication Sig Dispense Refill amLODIPine (NORVASC) 10 MG tablet Take 1 tablet (10 mg total) by mouth daily. 90 tablet 3    clopidogreL (PLAVIX) 75 mg tablet Take 1 tablet (75 mg total) by mouth daily. 90 tablet 3    empty container (SHARPS-A-GATOR DISPOSAL SYSTEM) Misc Use as directed for sharps disposal 1 each 2    evolocumab (REPATHA SURECLICK) 140 mg/mL PnIj Inject the contents of 1 pen (140 mg) under the skin every fourteen (14) days. 6 mL 3    pantoprazole (PROTONIX) 40 MG tablet Take 1 tablet (40 mg total) by mouth daily. (Patient taking differently: Take 1 tablet (40 mg total) by mouth daily. [place] aneeded) 90 tablet 3    spironolactone (ALDACTONE) 50 MG tablet Take 1 tablet (50 mg total) by mouth daily. (Patient taking differently: Take 1 tablet (50 mg total) by mouth daily. Taking 1/2 tab daily) 90 tablet 3     No facility-administered medications prior to visit.       Medical History:  Past Medical History:   Diagnosis Date    Anemia     past history, over 40 yrs ago    Arthritis     hand, knees, joints    Asthma     past history, no recent attacks    Asthma     patient stated due to asthmatic medication    Cataract 2008    Surgery    Diabetes mellitus (CMS-HCC)     Type II,  on 9/15 patient stated she was not longer diabetic, not taking meds    Dry eye     Hearing impairment     Heart murmur 2016    [facility] Verdie [facility]    Heart valve disease     Heart mumur, pt reports diagnosis 10 yrs ago (05/10/20)    History of stent insertion of renal artery 01/29/2020    ASA indefinitely and plavix x 6 months per procedure note    Hypertension     pt reports taking meds since about 1995    Neuromuscular disorder (CMS-HCC) 2014    Feet & Legs    Tear of meniscus of knee     4.5 yrs ago    Urinary incontinence     seen a doctor about nighttime bathroom use (adressed in 2019)       Surgical History:  Past Surgical History:   Procedure Laterality Date    CATARACT EXTRACTION W/ INTRAOCULAR LENS IMPLANT Left 02/27/2016    EYE SURGERY  01/30/16    Cataract surgery is scheduled.    JOINT REPLACEMENT  Knee    KNEE CARTILAGE SURGERY Right 2007, 2008    x 2    KNEE SURGERY      pt reports total left knee joint replacement surgery around 2016, 2017    PLANTAR FASCIECTOMY Left     PR REVASCULARIZE FEM/POP ARTERY,ANGIOPLASTY/STENT N/A 01/29/2020    Procedure: Peripheral Angiography W Intervetion;  Surgeon: Alvira Philips, MD;  Location: [facility] CATH;  Service: Cardiology    PR REVASCULARIZE FEM/POP ARTERY,ANGIOPLASTY/STENT Right 12/26/2021    Procedure: right renal artery intervention;  Surgeon: Alvira Philips, MD;  Location: [facility] CATH;  Service: Cardiology    PR UPPER GI ENDOSCOPY,BIOPSY N/A 04/01/2021    Procedure: UGI ENDOSCOPY; WITH BIOPSY, SINGLE OR MULTIPLE;  Surgeon: Jarvis Morgan, MD;  Location: [facility];  Service: Gastroenterology    PR XCAPSL CTRC RMVL INSJ IO LENS PROSTH W/O ECP Right 02/13/2016    Procedure: EXTRACAPSULAR CATARACT REMOVAL W/INSERTION OF INTRAOCULAR LENS PROSTHESIS, MANUAL OR MECHANICAL TECHNIQUE;  Surgeon: Garey Ham, MD;  Location: [facility];  Service: Ophthalmology    PR XCAPSL CTRC RMVL INSJ IO LENS PROSTH W/O ECP Left 02/27/2016    Procedure: EXTRACAPSULAR CATARACT REMOVAL W/INSERTION OF INTRAOCULAR LENS PROSTHESIS, MANUAL OR MECHANICAL TECHNIQUE;  Surgeon: Garey Ham, MD;  Location: [facility];  Service: Ophthalmology    RENAL ARTERY STENT  01/11/2020    ROOT CANAL      TUBAL LIGATION  1973    [facility]    WISDOM TOOTH EXTRACTION         Social History:  Tobacco use:   reports that she quit smoking about 38 years ago. Her smoking use included cigarettes. She has never used smokeless tobacco.  Alcohol use:   reports that she does not currently use alcohol.  Drug use:  reports no history of drug use.    Review of Systems:  ROS:   General ROS: negative for - fever,weight loss  HEENT ROS: negative for sore throat, or rhinitis  Respiratory ROS: no cough, shortness of breath, or wheezing  Cardiovascular ROS: no chest pain or dyspnea on exertion  Gastrointestinal ROS: +LLQ abdominal pain, no change in bowel habits, or black or bloody stools  Genito-Urinary ROS: no dysuria or hematuria  Musculoskeletal ROS: negative for - new joint pain, muscle pain or muscular weakness   Dermatological ROS:  negative for rash or new lesions  Neuro:no headaches      Vitals:    07/16/22 0800   BP: 158/80   BP Site: L Arm   BP Position: Sitting   BP Cuff Size: Small   Pulse: 67   SpO2: 98%   Weight: 73.5 kg (162 lb)       Physical Exam  General: alert, oriented, no distress  EYES: Anicteric sclerae.  NECK: no lymphadenopathy,   RESP: Relaxed respiratory effort. Clear to auscultation without wheezes or crackles.    CV: Regular rate and rhythm. Normal S1 and S2. No murmurs or gallops.  Marland Kitchen   ABDO:soft,nontender,no hsm,no guarding or rebound,no distension  EXT:  No peripheral edema  Skin:no rash  Neuro:no focal deficits

## 2022-07-22 NOTE — Unmapped (Signed)
[facility] PT [facility] [place]  OUTPATIENT PHYSICAL THERAPY  07/22/2022  Note Type: Treatment Note       Patient Name: Kayla Knight  Date of Birth:05-Jul-1941  Diagnosis:   Encounter Diagnoses   Name Primary?    Right hip pain Yes    Left hip pain     Weakness of both hips      Referring Provider: Rudene Anda    Date of Onset of Impairment: 07/07/2021  Date PT Care Plan Established or Reviewed: 07/07/2022  Date PT Treatment Started: 07/07/2022     Plan of Care Effective Date: 07/07/2022 - 10/04/2022  Session Number:  3    ASSESSMENT & PLAN   Assessment  Assessment details:    Today: [name] is a pleasant 81 y.o. female who presents for Physical Therapy Evaluation with Chronic B hip pain due to no specific MOI. Patient challenged with palloff press requiring moderate verbal and visual cueing to maintain form. Patient challenged with step ups due to fatigue, but able to complete with adequate rest breaks. The patient will benefit from skilled Physical Therapy intervention to address the moderate impairments listed below and to assist the patient in maximizing her functional independence and safe return to prior level of function.    Evaluation: [name] is a pleasant 81 y.o. female who presents for Physical Therapy Evaluation with Chronic B hip pain due to no specific MOI. Primary impairments include difficulty standing > 10 min, difficulty ambulating > 10 min, difficulty with stairs, and difficulty with household chores. Clinical presentation today is consistent with Chronic hip pain due to weakness. No imaging completed. Hx of uncontrolled HTN and renal artery stenosis. The patient will benefit from skilled Physical Therapy intervention to address the moderate impairments listed below and to assist the patient in maximizing her functional independence and safe return to prior level of function.             Impairments: decreased endurance, pain, decreased strength, decreased range of motion and impaired motor control Personal Factors/Comorbidities: 3+    Specific Comorbidities: HTN, Cardiac, Renal artery stenosis    Examination of Body Systems: musculoskeletal, activity/participation and communication    Clinical Presentation: stable    Clinical Decision Making: low    Prognosis: good prognosis    Positive Prognosis Rationale: motivated for treatment.  Negative Prognosis Rationale: medical status/condition, chronicity of condition, severity of symptoms, ADL performance, endurance and strength.      Therapy Goals      Goals:        1. In 12 weeks the patient will demonstrate independent performance of HEP to maintain functional gains.   2. In 12 weeks: Pt. will have improved Lower extremity strength of 4+/5 in order to perform her house chores and ambulate in the community safely.  3. In 12 weeks: pt. will be able to ambulate for 30 min without needing to sit down to relieve pain in the hip to improve her abilities ambulate in community  4. Pt will be able to stand with min difficulty after sitting for 40 min within 12 weeks.        Plan    Therapy options: will be seen for skilled physical therapy services    Planned therapy interventions: Aquatic Therapy, Location manager, Education - Patient, Endurance Activites, Functional Mobility, Gait Training, Home Exercise Program, Manual Therapy, Neuromuscular Re-education, Therapeutic Activities, Therapeutic Exercises, Education - Family/Caregiver, Dry Needling, E-Stim, TENS, Self-Care/Home Training, Civil engineer, contracting and Diaphragmatic/Pursed-lip Breathing    DME  Equipment: [employer].    Frequency: 1x week    Duration in weeks: 12    Education provided to: patient.    Education provided: HEP, Treatment options and plan, Symptom management, Safety education, Importance of Therapy, Anatomy, Body mechanics, Role of therapy in Rehabilitation, Posture, Community resources and Body awareness    Education results: verbalized good understanding, demonstrates understanding and needs reinforcement.    Communication/Consultation: Medicare Cert/POC sent to Referring Provider.      Total Session Time: 40    Treatment rendered today:      40        SUBJECTIVE   Interpreter Use: Not applicable    History of Present Condition      History of Present Condition/Chief Complaint:       Associated Diagnoses    Weakness of both hips [R29.898]  - Primary    B hip pain ongoing about year with no specific MOI. Pain starts after standing for > 15 minutes. During covid was walking about 5 miles a day but now not walking as much as she was. Sitting a lot while sewing and on the computer. No pain with ambulating stairs. Pain in Left flank.      Date of Onset:  07/07/2021  Subjective:     Has been very busy but still feeling good. No pain in the hips and back.     Pain  Current pain rating: 0  At best pain rating: 0  At worst pain rating: 5  Location: B hips  Quality: aching  Relieving factors: rest  Aggravating factors: performance of leg dominant activites, standing, stairs, walking and rising from sitting  Pain Related Behaviors: avoidance  Progression: no change  Red flags: none.    Precautions and Equipment  Precautions: Cardiac  Current Braces/Orthoses: None  Equipment Currently Used: None  Prior Functional Status:     Functional Limitation(s)-No physical limitations  Current functional status: limited lifting, limited standing tolerance, limited travel, limited exercise, limited household activities and limited walking tolerance  Social Support  Patient lives at: Senior living.    Barriers to Learning: No Barriers    Diagnostic Tests  No diagnostic tests performed        Treatments  Previous treatment: medication  Current treatment: physical therapy      Patient Goals  Patient goals for therapy: improved ambulation, improved balance, increased ROM, increased strength, independence with ADLs/IADLs and decreased pain  Patient goal: Be able to completing all cleaning without pain at end of day      Hunger vital sign:  1. Within the past 12 months, we worried whether our food would run out before we got money to buy more. Never True  2. Within the past 12 months, the food we bought just didn't last, and we didn't have money to get more. Never True    If patient identifies with either of the above, patient was provided with local food resource guide and non perishables when possible.    OBJECTIVE     Outcome Measure: Revised Oswestry: 7/50    Posture/Observation:   Sitting: slouched  Correction of Posture: decreased symptoms  Standing: unremarkable  Gait: non-antalgic    Lumbar Spine ROM  Motion No ROM Loss  (0%) Min ROM Loss  (1-33%) Mod ROM Loss  (34-65%) Major ROM Loss  (66-100%) Symptoms    Flexion  x      Extension x       Sideglide Right  x  Sideglide Left  x        Special tests:   Right Left   Slump - -   SLR - -   Babinski - -   SIJ cluster: - -   Femoral nerve - -       Palpation: TTP: Left lumbar paraspinals          Gait Analysis:  non-antalgic        Stairs: nonreciprocal when flared up  Sit to Stand: Slight weight shift towards RLE  TUG: NT  5x STS: 17.77 sec  : 460 feet.    Balance: SLS   - Right: 17.87  seconds  - Left: 13.51  seconds    Hip ROM   Motion Right AROM  Left AROM   Flexion 110 119   ER  30 32   IR  32 34     Strength  Muscle Right Left   Hip Flexion 4/5 4/5   Knee extension 4/5 4/5   Knee flexion 4/5 4/5   Hip extension 4/5 4/5   Hip abduction 4/5 4/5     Special tests:       Right Left   FADIR Negative Negative   FABER Negative Negative   Scour Negative Negative   External De-Rotation test Not tested Not tested   Trendelenburg Not tested Not tested   Elys Not tested Not tested   Maisie Fus Not tested Not tested   Claiborne Rigg Not tested Not tested             TREATMENT RENDERED       Therapeutic Exercise:  40 Minutes   Performed with direct PT demonstration, instruction, supervision, and guidance.   - Education on condition, prognosis, and PT POC  - Bridges with march 2x8   - LTR 10x  - Recumbent Bike Seat 15  - Standing hip abduction/extension  L2TB 2x10   - STS from chair 3x8   - DL Leg Press Seat 9 47# 2x8  - Palloff Press GTB 2x10  - Step up + one riser 2x10        HEP Access Code: [id number]  Next Visit Plan: Progress lumbar mobility and hip strengthening,  Monster walks/lateral band walks      Total Treatment Time : 40 Minutes        Therapeutic Interventions Charges  $$ Therapeutic Exercise [mins]: 40                 I attest that I have reviewed the above information.  Signed: Army Chaco, PT, DPT  07/22/2022 5:10 PM        I reviewed the no-show/attendance policy with the patient and caregiver(s). The patient is aware that they must call to cancel appointments more than 24 hours in advance. They are also aware that if they late cancel or no-show three times, we reserve the right to cancel their remaining appointments. This policy is in place to allow [place] to best serve the needs of our caseload.    If patient returns to clinic with variance in plan of care, then it may be attributable to one or more of the following factors: preferred clinician availability, appointment time request availability, therapy pool appointment availability, major holiday with clinic closure, caregiver availability, patient transportation, conflicting medical appointment, inclement weather, and/or patient illness.    If patient does not return for follow up visit(s) related to this episode of care, this note will serve as their discharge note from  Physical Therapy.

## 2022-07-28 NOTE — Unmapped (Signed)
[facility]   [facility]  [address]                                 [place], [place] [address]    [phone]    Kayla Knight did not show for their scheduled physical therapy follow-up session. Patient did not notify us prior to missing our scheduled appointment. This is the patient's First no-show, late arrival, or late-cancellation.  Patients who no show, arrive late (and cannot be accommodated on the same day), or cancel within 24 hours of the scheduled appointment three times within 12 months with an individual clinic or provider, can be dismissed from the provider's or discipline's clinic and will have their no show frequency displayed in the [facility] (did not keep appointment) graph on the patient's appointment desk. Patient dismissals for repeat offenders gives providers the opportunity to improve access for new patients. All patient dismissals are made at the provider's discretion.     Please contact me if you have any questions or concerns.      Thank you for this referral,     Signed: Army Chaco, PT  07/27/2022 5:01 PM

## 2022-08-03 NOTE — Unmapped (Signed)
[facility] PT [facility] [place]  OUTPATIENT PHYSICAL THERAPY  08/03/2022  Note Type: Discharge Note       Patient Name: Kayla Knight  Date of Birth:[birth year]  Diagnosis:   Encounter Diagnoses   Name Primary?    Right hip pain Yes    Left hip pain     Weakness of both hips      Referring Provider: Rudene Anda    Date of Onset of Impairment: 07/07/2021  Date PT Care Plan Established or Reviewed: 07/07/2022  Date PT Treatment Started: 07/07/2022     Plan of Care Effective Date: 07/07/2022 - 08/03/2022  Session Number:  4    ASSESSMENT & PLAN   Assessment  Assessment details:    Today: Kayla Knight has been seen in PT for 4 visits for treatment of chronic B hip pain due to no specific MOI. Pt has progressed very well.  She reports complete resolution of pain and reports she is able to do all daily activities without limitations.  She has met all goals.  She is compliant and independent with her HEP, thus she will be discharged from PT at this time.            Personal Factors/Comorbidities: 3+    Specific Comorbidities: HTN, Cardiac, Renal artery stenosis    Examination of Body Systems: musculoskeletal, activity/participation and communication    Clinical Presentation: stable    Clinical Decision Making: low          Therapy Goals      Goals:        1. In 12 weeks the patient will demonstrate independent performance of HEP to maintain functional gains. - goal met  2. In 12 weeks: Pt. will have improved Lower extremity strength of 4+/5 in order to perform her house chores and ambulate in the community safely. - goal met  3. In 12 weeks: pt. will be able to ambulate for 30 min without needing to sit down to relieve pain in the hip to improve her abilities ambulate in community. - goal met  4. Pt will be able to stand with min difficulty after sitting for 40 min within 12 weeks. - goal met        Plan      DME Equipment: [employer].      Education provided to: patient.    Education provided: HEP, Treatment options and plan, Symptom management, Safety education, Importance of Therapy, Anatomy, Body mechanics, Role of therapy in Rehabilitation, Posture, Community resources and Body awareness    Education results: verbalized good understanding and demonstrates understanding.    Communication/Consultation: Medicare Cert/POC sent to Referring Provider.              SUBJECTIVE   Interpreter Use: Not applicable    History of Present Condition      History of Present Condition/Chief Complaint:       Associated Diagnoses    Weakness of both hips [R29.898]  - Primary    B hip pain ongoing about year with no specific MOI. Pain starts after standing for > 15 minutes. During covid was walking about 5 miles a day but now not walking as much as she was. Sitting a lot while sewing and on the computer. No pain with ambulating stairs. Pain in Left flank.      Date of Onset:  07/07/2021  Subjective:     Has been very busy but still feeling good. No pain in the hips and back. Feels ready for  today to be her last PT visit.    Pain  Current pain rating: 0  At best pain rating: 0  At worst pain rating: 0  Location: B hips  Quality: aching  Relieving factors: rest  Aggravating factors: performance of leg dominant activites, standing, stairs, walking and rising from sitting  Pain Related Behaviors: avoidance  Progression: no change  Red flags: none.    Precautions and Equipment  Precautions: Cardiac  Current Braces/Orthoses: None  Equipment Currently Used: None  Prior Functional Status:     Functional Limitation(s)-No physical limitations  Current functional status: limited lifting, limited standing tolerance, limited travel, limited exercise, limited household activities and limited walking tolerance  Social Support  Patient lives at: Senior living.    Barriers to Learning: No Barriers    Diagnostic Tests  No diagnostic tests performed        Treatments  Previous treatment: medication  Current treatment: physical therapy      Patient Goals  Patient goals for therapy: improved ambulation, improved balance, increased ROM, increased strength, independence with ADLs/IADLs and decreased pain  Patient goal: Be able to completing all cleaning without pain at end of day      Hunger vital sign:  1. Within the past 12 months, we worried whether our food would run out before we got money to buy more. Never True  2. Within the past 12 months, the food we bought just didn't last, and we didn't have money to get more. Never True    If patient identifies with either of the above, patient was provided with local food resource guide and non perishables when possible.    OBJECTIVE     Outcome Measure: Revised Oswestry: 0/50 on 08/03/22  5x sit to stand: 9 seconds on 08/03/22  LE MANUAL MUSCLE TESTING: 5/5 throughout (B)  Gait: WNL            TREATMENT RENDERED       Therapeutic Exercise:  38 Minutes   Performed with direct PT demonstration, instruction, supervision, and guidance.   - Discharge assessment  - Recumbent bike, seat 15, 5 minutes  - Standing hip abduction/extension  L2TB 2x10   - Lateral band walk L2 band: 10 feet x 6 laps  - DL Leg Press Seat 9 10# 9U04  - Step up + one riser 2x20  - Verbal review of HEP  - Encouraged pt to try to begin walking more or going back to the [facility] again        HEP Access Code: [id number]        Total Treatment Time : 38 Minutes        Therapeutic Interventions Charges  $$ Therapeutic Exercise [mins]: 38                 I attest that I have reviewed the above information.  Signed: Ashok Cordia, PT, DPT  08/03/2022 10:57 AM        I reviewed the no-show/attendance policy with the patient and caregiver(s). The patient is aware that they must call to cancel appointments more than 24 hours in advance. They are also aware that if they late cancel or no-show three times, we reserve the right to cancel their remaining appointments. This policy is in place to allow [place] to best serve the needs of our caseload.    If patient returns to clinic with variance in plan of care, then it may be attributable to one or more of the  following factors: preferred clinician availability, appointment time request availability, therapy pool appointment availability, major holiday with clinic closure, caregiver availability, patient transportation, conflicting medical appointment, inclement weather, and/or patient illness.    If patient does not return for follow up visit(s) related to this episode of care, this note will serve as their discharge note from Physical Therapy.            Objective

## 2022-08-04 NOTE — Unmapped (Signed)
Patient called ob/gyn nurse triage line to discuss that she's been having hot flashes. She does admit to having very high blood pressure- she will see pcp this week to make sure that hot flashes are not related to blood pressure. Scheduled for next available gyn appt.

## 2022-08-06 ENCOUNTER — Ambulatory Visit: Admit: 2022-08-06 | Discharge: 2022-08-06 | Disposition: A | Discharge status: Home / Self Care | Payer: MEDICARE

## 2022-08-06 DIAGNOSIS — M79602 Pain in left arm: Principal | ICD-10-CM

## 2022-08-06 LAB — BASIC METABOLIC PANEL
ANION GAP: 9 mmol/L (ref 5–14)
BLOOD UREA NITROGEN: 15 mg/dL (ref 9–23)
BUN / CREAT RATIO: 16
CALCIUM: 9.1 mg/dL (ref 8.7–10.4)
CHLORIDE: 106 mmol/L (ref 98–107)
CO2: 24 mmol/L (ref 20.0–31.0)
CREATININE: 0.96 mg/dL
EGFR CKD-EPI (2021) FEMALE: 60 mL/min/{1.73_m2} (ref >=60)
GLUCOSE RANDOM: 107 mg/dL (ref 70–179)
POTASSIUM: 3.9 mmol/L (ref 3.4–4.8)
SODIUM: 139 mmol/L (ref 135–145)

## 2022-08-06 LAB — HIGH SENSITIVITY TROPONIN I - SINGLE: HIGH SENSITIVITY TROPONIN I: 19 ng/L (ref <=34)

## 2022-08-06 NOTE — Unmapped (Addendum)
Pt here with L arm pain and weakness that started last night. Couldn't sleep because of the discomfort. Pt does not endorse weakness anywhere else or any unsteadiness walking.

## 2022-08-06 NOTE — Unmapped (Signed)
[facility]  Emergency Department Provider Note      ED Clinical Impression      Final diagnoses:   Left arm pain (Primary)          Impression, Medical Decision Making, Progress Notes and Critical Care      Impression, Differential Diagnosis and Plan of Care    Kayla Knight is a 81 y.o. female with a history as below presenting with resolved left arm pain.  Given her age she was concerned for possible cardiac disease.  Due to her pain limited weakness she was also concern for possible stroke.  Clinical picture is not suggestive of either these processes at this time.  Plan for EKG and troponin to rule out atypical cardiac presentation.  Neurologic exam is normal.  Will also check electrolytes due to what sounds like muscle spasms.  If no concerning findings on the above testing, anticipate discharge with primary care follow-up.    1:35 PM  BMP is normal.  Troponin is negative.  Clinical picture continues to be most consistent with resolved muscle spasm.  Patient discharged home with instructions to drink plenty of fluid and take Tylenol as needed. The test results, likely diagnosis and plan of care were discussed with the patient. Usual return precautions pertaining to these were explained, along with follow up instructions.  All questions have been answered. She states understanding and agreement.    Independent Interpretation of Studies    I have independently interpreted the following studies:  EKG: EKG dated 08/06/2022 at 11:14 AM demonstrates a sinus rhythm at 67 bpm.  There are occasional premature atrial complexes.  There were no acute ischemic changes.  A first-degree A-V block is present with PR interval of 208 ms.  Intervals are otherwise normal including QTc.  Axis is leftward.    Considerations Regarding Disposition/Escalation of Care and Critical Care    Indications for observation/admission (or consideration of observation/admission) and/or appropriateness for outpatient management: There is no indication for further testing, consultation or observation/admission at the time of discharge. She is stable and appropriate for outpatient management with return precautions.   Patient/Family/Caregiver Discussions: The available test results during my care of the patient, likely diagnosis and plan of care were discussed with the patient. All questions have been answered. She states understanding and agreement.       Portions of this record have been created using Scientist, clinical (histocompatibility and immunogenetics). Dictation errors have been sought, but may not have been identified and corrected.    See chart and resident provider documentation for details.    ____________________________________________         History        Reason for Visit  Arm Pain and Extremity Weakness      HPI   Kayla Knight is a 81 y.o. female with a history of HTN, T2DM, renal artery stenosis s/p bilateral stents, chronic bilateral hip pain, and GERD presenting to the ED after left upper arm muscle tightness/spasm and a lump to the outer aspect of her arm which began about 10 PM last night. She was concerned for a cardiac etiology of her symptoms and took 4 aspirin with some relief. The patient reports she was unable to carry her purse or lift her arm to comb her hair due to pain. She states her pain is improved this morning. She denies any injury or recent immunizations. Denies chest pain, shortness of breath, or weakness.    Outside Historian(s)  (EMS, Significant Other, Family, Parent,  Caregiver, [employer], etc.)    None    External Records Reviewed  Prior labs    08/03/22 PT note, 07/16/22 GI note, 12/26/21 Admission Summary for past medical history.    Past Medical History:   Diagnosis Date    Anemia     past history, over 40 yrs ago    Arthritis     hand, knees, joints    Asthma     past history, no recent attacks    Asthma     patient stated due to asthmatic medication    Cataract 2008    Surgery    Diabetes mellitus (CMS-HCC)     Type II, on 9/15 patient stated she was not longer diabetic, not taking meds    Dry eye     Hearing impairment     Heart murmur 2016    [facility]. Verdie [facility]ire [facility]    Heart valve disease     Heart mumur, pt reports diagnosis 10 yrs ago (05/10/20)    History of stent insertion of renal artery 01/29/2020    ASA indefinitely and plavix x 6 months per procedure note    Hypertension     pt reports taking meds since about 1995    Neuromuscular disorder (CMS-HCC) 2014    Feet & Legs    Tear of meniscus of knee     4.5 yrs ago    Urinary incontinence     seen a doctor about nighttime bathroom use (adressed in 2019)       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    JOINT REPLACEMENT  Knee    KNEE CARTILAGE SURGERY Right 2007, 2008    x 2    KNEE SURGERY      pt reports total left knee joint replacement surgery around 2016, 2017    PLANTAR FASCIECTOMY Left     PR REVASCULARIZE FEM/POP ARTERY,ANGIOPLASTY/STENT N/A 01/29/2020    Procedure: Peripheral Angiography W Intervetion;  Surgeon: Alvira Philips, MD;  Location: [facility] CATH;  Service: Cardiology    PR REVASCULARIZE FEM/POP ARTERY,ANGIOPLASTY/STENT Right 12/26/2021    Procedure: right renal artery intervention;  Surgeon: Alvira Philips, MD;  Location: [facility] CATH;  Service: Cardiology    PR UPPER GI ENDOSCOPY,BIOPSY N/A 04/01/2021  Procedure: UGI ENDOSCOPY; WITH BIOPSY, SINGLE OR MULTIPLE;  Surgeon: Jarvis Morgan, MD;  Location: [facility] PROCEDURES [facility];  Service: Gastroenterology    PR XCAPSL CTRC RMVL INSJ IO LENS PROSTH W/O ECP Right 02/13/2016    Procedure: EXTRACAPSULAR CATARACT REMOVAL W/INSERTION OF INTRAOCULAR LENS PROSTHESIS, MANUAL OR MECHANICAL TECHNIQUE;  Surgeon: Garey Ham, MD;  Location: [facility];  Service: Ophthalmology    PR XCAPSL CTRC RMVL INSJ IO LENS PROSTH W/O ECP Left 02/27/2016    Procedure: EXTRACAPSULAR CATARACT REMOVAL W/INSERTION OF INTRAOCULAR LENS PROSTHESIS, MANUAL OR MECHANICAL TECHNIQUE;  Surgeon: Garey Ham, MD;  Location: [facility] OR [facility];  Service: Ophthalmology    RENAL ARTERY STENT  01/11/2020    ROOT CANAL      TUBAL LIGATION  1973    [facility]    WISDOM TOOTH EXTRACTION         No current facility-administered medications for this encounter.    Current Outpatient Medications:     amLODIPine (NORVASC) 10 MG tablet, Take 1 tablet (10 mg total) by mouth daily., Disp: 90 tablet, Rfl: 3    clopidogreL (PLAVIX) 75 mg tablet, Take 1 tablet (75 mg total) by mouth daily., Disp: 90 tablet, Rfl: 3    empty container (SHARPS-A-GATOR DISPOSAL SYSTEM) Misc, Use as directed for sharps disposal, Disp: 1 each, Rfl: 2    evolocumab (REPATHA SURECLICK) 140 mg/mL PnIj, Inject the contents of 1 pen (140 mg) under the skin every fourteen (14) days., Disp: 6 mL, Rfl: 3    pantoprazole (PROTONIX) 40 MG tablet, Take 1 tablet (40 mg total) by mouth daily. (Patient taking differently: Take 1 tablet (40 mg total) by mouth daily. [place] aneeded), Disp: 90 tablet, Rfl: 3    spironolactone (ALDACTONE) 50 MG tablet, Take 1 tablet (50 mg total) by mouth daily. (Patient taking differently: Take 1 tablet (50 mg total) by mouth daily. Taking 1/2 tab daily), Disp: 90 tablet, Rfl: 3    Allergies  Penicillins, Lisinopril, Statins-hmg-coa reductase inhibitors, and Sulfa (sulfonamide antibiotics)    Family History   Problem Relation Age of Onset    Heart disease Mother     Hypertension Mother     Dementia Mother     Arthritis Mother     Heart disease Father     Thyroid disease Father     Glaucoma Father     Alcohol abuse Father     Heart attack Father 36    Arthritis Father     Hypertension Father         Massive heart attack    Arthritis Sister     Glaucoma Sister     Hypertension Sister     Diabetes Sister     Diabetes Daughter     Diabetes Paternal Aunt     No Known Problems Brother     No Known Problems Maternal Aunt     No Known Problems Maternal Uncle     No Known Problems Paternal Uncle     No Known Problems Maternal Grandmother     No Known Problems Maternal Grandfather     No Known Problems Paternal Grandmother     No Known Problems Paternal Grandfather     Arthritis Sister         Noticeable in extremities    COPD Sister     Depression Sister     Hypertension Sister     Glaucoma Sister     Asthma Daughter  Ocassional    Hypertension Daughter     Miscarriages / [place] Daughter     Cancer Maternal Aunt         Died more than 32yrs ago    Early death Maternal Aunt     Diabetes Paternal Aunt         4 Aunts    Kidney disease Paternal Aunt     Amblyopia Neg Hx     Blindness Neg Hx     Cancer Neg Hx     Cataracts Neg Hx     Macular degeneration Neg Hx     Retinal detachment Neg Hx     Strabismus Neg Hx     Stroke Neg Hx        Social History  Social History     Tobacco Use    Smoking status: Former     Packs/day: 0.00     Years: 0.00     Additional pack years: 0.00     Total pack years: 0.00     Types: Cigarettes     Quit date: 05/19/1984     Years since quitting: 38.2    Smokeless tobacco: Never   Vaping Use    Vaping Use: Never used   Substance Use Topics    Alcohol use: Not Currently    Drug use: Never          Physical Exam     This provider entered the patient's room: Yes:    If this provider did not enter the room, a comprehensive physical exam was not able to be performed due to increased infection risk to themselves, other providers, staff and other patients), as well as to conserve personal protective equipment (PPE) utilization during the COVID-19 pandemic.    If this provider did enter the patient room, the following was PPE worn: Surgical mask, eye protection and gloves     ED Triage Vitals   Enc Vitals Group      BP 08/06/22 0741 164/81      Heart Rate 08/06/22 0715 83      SpO2 Pulse --       Resp 08/06/22 0741 16      Temp 08/06/22 0741 36.7 ??C (98 ??F)      Temp Source 08/06/22 0741 Oral      SpO2 08/06/22 0715 97 %      Weight 08/06/22 0738 71.6 kg (157 lb 13.6 oz)     Constitutional: Alert and oriented. Well appearing and in no distress.  Eyes: Conjunctivae are normal.  ENT       Head: Normocephalic and atraumatic.       Nose: No congestion.       Mouth/Throat: Mucous membranes are moist.       Neck: No stridor.  Hematological/Lymphatic/Immunilogical: No axillary lymphadenopathy.  Cardiovascular: Normal rate, regular rhythm. Normal and symmetric distal pulses are present in all extremities. A systolic murmur is present c/w patients history of valvular heart disease.  Respiratory: Normal respiratory effort. Breath sounds are normal.  Gastrointestinal: Soft and nontender. There is no CVA tenderness.  Genitourinary: Deferred.  Musculoskeletal: Normal range of motion in all extremities. There is no tenderness, masses, or lymphadenopathy of the left upper extremity.       Right lower leg: No tenderness or edema.       Left lower leg: No tenderness or edema.  Neurologic: Normal speech and language. No gross focal neurologic deficits are appreciated. Normal speech and language without aphasia. PERRL.  EOMI without nystagmus. Face symmetrical without droop. Normal facial sensation. Tongue midline. Normal shoulder shrug. No pronator drift. Normal strength and sensation to light touch in all extremities. Normal finger-to-nose. Normal gait. Normal, symmetric biceps, patellar and Achilles reflexes. Negative Romberg test. No focal neurologic deficits are appreciated. Bilateral deltoid radial median and ulnar nerve motor and sensory function is intact.   Skin: Skin is warm, dry and intact. No rash noted.  Psychiatric: Mood and affect are normal. Speech and behavior are normal.    Documentation assistance was provided by Gus Height, Scribe on August 06, 2022 at 10:40 AM for Dorie Rank, MD.       Documentation assistance was provided by the scribe in my presence.  The documentation recorded by the scribe has been reviewed by me and accurately reflects the services I personally performed.           Jaydan Meidinger, Beverley Fiedler, MD  08/10/22 2232

## 2022-08-06 NOTE — Unmapped (Signed)
Patient states that last night she was watching TV and she suddenly felt a knot in [her] left arm (in deltoid - muscle feels tight). Patient states that she can't raise her arm to comb her hair or carry her purse. Patient denies injury. Patient denies CP.

## 2022-08-07 ENCOUNTER — Ambulatory Visit: Admit: 2022-08-07 | Discharge: 2022-08-08 | Payer: MEDICARE | Attending: Internal Medicine | Primary: Internal Medicine

## 2022-08-07 DIAGNOSIS — Z114 Encounter for screening for human immunodeficiency virus [HIV]: Principal | ICD-10-CM

## 2022-08-07 DIAGNOSIS — R61 Generalized hyperhidrosis: Principal | ICD-10-CM

## 2022-08-07 LAB — CBC W/ AUTO DIFF
HEMATOCRIT: 40.8 % (ref 35.0–45.0)
HEMOGLOBIN: 12.9 g/dL (ref 11.3–15.5)
LYMPHOCYTES ABSOLUTE COUNT: 1.9 10*9/L (ref 1.0–4.8)
LYMPHOCYTES RELATIVE PERCENT: 34.7 %
MEAN CORPUSCULAR HEMOGLOBIN CONC: 31.7 g/dL (ref 31.0–36.0)
MEAN CORPUSCULAR HEMOGLOBIN: 24 pg — ABNORMAL LOW (ref 26.0–32.0)
MEAN CORPUSCULAR VOLUME: 76 fL — ABNORMAL LOW (ref 89.0–97.0)
MEAN PLATELET VOLUME: 6.7 fL (ref 6.5–8.9)
MONOCYTES ABSOLUTE COUNT: 0.4 10*9/L (ref 0.0–0.8)
MONOCYTES RELATIVE PERCENT: 7.8 %
NEUTROPHILS ABSOLUTE COUNT: 3.4 10*9/L (ref 1.8–7.0)
NEUTROPHILS RELATIVE PERCENT: 57.5 %
PLATELET COUNT: 332 10*9/L (ref 140–440)
RED BLOOD CELL COUNT: 5.38 10*12/L — ABNORMAL HIGH (ref 3.80–5.10)
RED CELL DISTRIBUTION WIDTH: 18 % — ABNORMAL HIGH (ref 11.0–15.0)
WBC ADJUSTED: 5.7 10*9/L (ref 3.8–10.8)

## 2022-08-07 LAB — URINALYSIS WITH MICROSCOPY
BILIRUBIN UA: NEGATIVE
GLUCOSE UA: NEGATIVE
KETONES UA: NEGATIVE
NITRITE UA: NEGATIVE
PH UA: 5.5 (ref 5.0–8.0)
PROTEIN UA: NEGATIVE
RBC UA: 2 /HPF (ref 0–3)
SPECIFIC GRAVITY UA: >=1.03 (ref 1.002–1.030)
SQUAMOUS EPITHELIAL: 4 /HPF (ref 0–5)
UROBILINOGEN UA: 0.2
WBC UA: 1 /HPF (ref 0–3)

## 2022-08-07 LAB — TSH: THYROID STIMULATING HORMONE: 1.287 u[IU]/mL (ref 0.550–4.780)

## 2022-08-07 LAB — COMPREHENSIVE METABOLIC PANEL
ALBUMIN/GLOBULIN RATIO: 0.8 — ABNORMAL LOW (ref 1.1–2.2)
ALBUMIN: 3.6 g/dL (ref 3.5–5.0)
ALKALINE PHOSPHATASE: 78 U/L (ref 50–136)
ALT (SGPT): 19 U/L (ref 12–78)
ANION GAP: 8 mmol/L
AST (SGOT): 20 U/L (ref 7–31)
BILIRUBIN TOTAL: 0.3 mg/dL (ref 0.0–1.5)
BLOOD UREA NITROGEN: 21 mg/dL (ref 5–26)
BUN / CREAT RATIO: 20 (ref 12–25)
CALCIUM: 9.3 mg/dL (ref 8.5–10.5)
CHLORIDE: 103 mmol/L (ref 95–110)
CO2: 28.5 mmol/L (ref 21.0–31.0)
CREATININE: 1.06 mg/dL (ref 0.50–1.50)
EGFR CKD-EPI (2021) FEMALE: 53 mL/min/{1.73_m2} — ABNORMAL LOW (ref >=60)
GLOBULIN, TOTAL: 4.5 g/dL — ABNORMAL HIGH (ref 1.5–4.3)
GLUCOSE RANDOM: 96 mg/dL (ref 60–100)
POTASSIUM: 3.9 mmol/L (ref 3.5–5.1)
PROTEIN TOTAL: 8.1 g/dL (ref 6.4–8.3)
SODIUM: 139 mmol/L (ref 136–145)

## 2022-08-07 LAB — C-REACTIVE PROTEIN: C-REACTIVE PROTEIN: 35 mg/L — ABNORMAL HIGH (ref <=10.0)

## 2022-08-07 NOTE — Unmapped (Signed)
Assessment and Plan      1. Night sweats    2. Encounter for screening for human immunodeficiency virus (HIV)        We discussed the differential diagnosis and potential evaluation in detail. Exam is non-focal.  Follow up pending results.      No problem-specific Assessment & Plan notes found for this encounter.      Orders Placed This Encounter   Procedures    Blood culture    Blood culture    CBC w/ Differential    Comprehensive metabolic panel    TSH    HIV Antigen/Antibody Combo ([facility])    C-reactive protein    Urinalysis with Microscopy    Quantiferon TB Gold Plus       Requested Prescriptions      No prescriptions requested or ordered in this encounter       There are no discontinued medications.    No follow-ups on file.    Signs and symptoms that should prompt return sooner than scheduled were reviewed with the patient.      PCMH Components:     Medication adherence and barriers to the treatment plan have been addressed. Opportunities to optimize healthy behaviors have been discussed. Patient / caregiver voiced understanding.        HPI      Chief Complaint    Chief Complaint   Patient presents with    Routine Follow-up       Kayla Knight presents for evaluation and management of the following concern(s):    She notes new onset of hot flashes over the last two or three months which seem to be getting worse.  She awakens with night sweats and then chills.  They occur during the day.      Health Maintenance reviewed - no intervention today      I have reviewed the patient's medical history in detail and updated the computerized patient record.              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)    Renal artery stenosis, native, bilateral (CMS-HCC)    Hyperlipidemia    Aftercare following joint replacement    Presence of left artificial knee joint    Hypokalemia    History of chest pain    Decreased GFR    Nonrheumatic aortic valve insufficiency    Non compliance w medication regimen    Endometrial thickening on ultrasound    Weakness of both hips    Right hip pain         Allergies:  Penicillins, Lisinopril, Statins-hmg-coa reductase inhibitors, and Sulfa (sulfonamide antibiotics)    Medications:   Outpatient Medications Prior to Visit   Medication Sig Dispense Refill    amLODIPine (NORVASC) 10 MG tablet Take 1 tablet (10 mg total) by mouth daily. 90 tablet 3    clopidogreL (PLAVIX) 75 mg tablet Take 1 tablet (75 mg total) by mouth daily. 90 tablet 3  empty container (SHARPS-A-GATOR DISPOSAL SYSTEM) Misc Use as directed for sharps disposal 1 each 2    evolocumab (REPATHA SURECLICK) 140 mg/mL PnIj Inject the contents of 1 pen (140 mg) under the skin every fourteen (14) days. 6 mL 3    pantoprazole (PROTONIX) 40 MG tablet Take 1 tablet (40 mg total) by mouth daily. (Patient taking differently: Take 1 tablet (40 mg total) by mouth daily. [place] aneeded) 90 tablet 3    spironolactone (ALDACTONE) 50 MG tablet Take 1 tablet (50 mg total) by mouth daily. (Patient taking differently: Take 1 tablet (50 mg total) by mouth daily. Taking 1/2 tab daily) 90 tablet 3     No facility-administered medications prior to visit.       Medical History:  Past Medical History:   Diagnosis Date    Anemia     past history, over 40 yrs ago    Arthritis     hand, knees, joints    Asthma     past history, no recent attacks    Asthma     patient stated due to asthmatic medication    Cataract 2008    Surgery    Diabetes mellitus (CMS-HCC)     Type II, on 9/15 patient stated she was not longer diabetic, not taking meds    Dry eye     Hearing impairment     Heart murmur 2016    [facility]. Verdie [facility]ire @ [facility]    Heart valve disease     Heart mumur, pt reports diagnosis 10 yrs ago (05/10/20)    History of stent insertion of renal artery 01/29/2020    ASA indefinitely and plavix x 6 months per procedure note    Hypertension     pt reports taking meds since about 1995    Neuromuscular disorder (CMS-HCC) 2014    Feet & Legs    Tear of meniscus of knee     4.5 yrs ago    Urinary incontinence     seen a doctor about nighttime bathroom use (adressed in 2019)       Surgical History:  Past Surgical History:   Procedure Laterality Date    CATARACT EXTRACTION W/ INTRAOCULAR LENS IMPLANT Left 02/27/2016    EYE SURGERY  01/30/16    Cataract surgery is scheduled.    JOINT REPLACEMENT  Knee    KNEE CARTILAGE SURGERY Right 2007, 2008    x 2    KNEE SURGERY      pt reports total left knee joint replacement surgery around 2016, 2017    PLANTAR FASCIECTOMY Left     PR REVASCULARIZE FEM/POP ARTERY,ANGIOPLASTY/STENT N/A 01/29/2020    Procedure: Peripheral Angiography W Intervetion;  Surgeon: Alvira Philips, MD;  Location: [facility] CATH;  Service: Cardiology    PR REVASCULARIZE FEM/POP ARTERY,ANGIOPLASTY/STENT Right 12/26/2021    Procedure: right renal artery intervention;  Surgeon: Alvira Philips, MD;  Location: [facility] CATH;  Service: Cardiology    PR UPPER GI ENDOSCOPY,BIOPSY N/A 04/01/2021    Procedure: UGI ENDOSCOPY; WITH BIOPSY, SINGLE OR MULTIPLE;  Surgeon: Jarvis Morgan, MD;  Location: [facility];  Service: Gastroenterology    PR XCAPSL CTRC RMVL INSJ IO LENS PROSTH W/O ECP Right 02/13/2016    Procedure: EXTRACAPSULAR CATARACT REMOVAL W/INSERTION OF INTRAOCULAR LENS PROSTHESIS, MANUAL OR MECHANICAL TECHNIQUE;  Surgeon: Garey Ham, MD;  [facility];  Service: Ophthalmology    PR XCAPSL CTRC RMVL INSJ IO LENS PROSTH W/O  ECP Left 02/27/2016    Procedure: EXTRACAPSULAR CATARACT REMOVAL W/INSERTION OF INTRAOCULAR LENS PROSTHESIS, MANUAL OR MECHANICAL TECHNIQUE;  Surgeon: Garey Ham, MD;  Location: [facility];  Service: Ophthalmology    RENAL ARTERY STENT  01/11/2020    ROOT CANAL      TUBAL LIGATION  1973    [facility]    WISDOM TOOTH EXTRACTION         Social History:  Tobacco use:   reports that she quit smoking about 38 years ago. Her smoking use included cigarettes. She has never used smokeless tobacco.  Alcohol use:   reports that she does not currently use alcohol.  Drug use:  reports no history of drug use.      Family History:  Family History   Problem Relation Age of Onset    Heart disease Mother     Hypertension Mother     Dementia Mother     Arthritis Mother     Heart disease Father     Thyroid disease Father     Glaucoma Father     Alcohol abuse Father     Heart attack Father 65    Arthritis Father     Hypertension Father         Massive heart attack    Arthritis Sister     Glaucoma Sister     Hypertension Sister     Diabetes Sister     Diabetes Daughter     Diabetes Paternal Aunt     No Known Problems Brother     No Known Problems Maternal Aunt     No Known Problems Maternal Uncle     No Known Problems Paternal Uncle     No Known Problems Maternal Grandmother     No Known Problems Maternal Grandfather     No Known Problems Paternal Grandmother     No Known Problems Paternal Grandfather     Arthritis Sister         Noticeable in extremities    COPD Sister     Depression Sister     Hypertension Sister     Glaucoma Sister     Asthma Daughter         Ocassional    Hypertension Daughter     Miscarriages / Stillbirths Daughter     Cancer Maternal Aunt         Died more than 59yrs ago    Early death Maternal Aunt     Diabetes Paternal Aunt         4 Aunts    Kidney disease Paternal Aunt     Amblyopia Neg Hx     Blindness Neg Hx     Cancer Neg Hx     Cataracts Neg Hx     Macular degeneration Neg Hx     Retinal detachment Neg Hx     Strabismus Neg Hx     Stroke Neg Hx Review of Systems:    Pertinent review of systems is noted in the HPI. A comprehensive review of systems was otherwise negative.          Physical Exam      BP 142/78 (BP Site: L Arm, BP Position: Sitting, BP Cuff Size: Medium)  - Pulse 74  - Wt 71.7 kg (158 lb)  - SpO2 95%  - BMI 24.74 kg/m??       BP Readings from Last 3 Encounters:   08/07/22 142/78   08/06/22 143/71  07/16/22 158/80       Wt Readings from Last 3 Encounters:   08/07/22 71.7 kg (158 lb)   08/06/22 71.6 kg (157 lb 13.6 oz)   07/16/22 73.5 kg (162 lb)       She appears well, in no apparent distress.  Alert and oriented times three, pleasant and cooperative. Vital signs are as documented in vital signs section.   Normal sclerae/conjunctivae.  Mouth and throat are normal. Neck is supple without goiter.  Cor is regular rate and rhythm with 2/6 high pitched systolic murmur.  Lungs are clear throughout with normal respiratory effort. Abdomen is soft with normal bowel sounds and without tenderness, rebound or guarding, or messages.   No rash.  Skin is normal in turgor and texture. . No lymphadenopathy in the anterior or posterior neck, supraclavicular, axillary or inguinal areas. No hepato-splenomegaly noted. Skin is normal in turgor and texture.   No joint effusion.

## 2022-08-10 LAB — HIV ANTIGEN/ANTIBODY COMBO: HIV ANTIGEN/ANTIBODY COMBO: NONREACTIVE

## 2022-08-11 LAB — TB AG2: TB AG2 VALUE: 0.06

## 2022-08-11 LAB — QUANTIFERON TB GOLD PLUS
QUANTIFERON ANTIGEN 1 MINUS NIL: -0.01 [IU]/mL
QUANTIFERON ANTIGEN 2 MINUS NIL: -0.01 [IU]/mL
QUANTIFERON MITOGEN: 9.93 [IU]/mL
QUANTIFERON TB GOLD PLUS: NEGATIVE
QUANTIFERON TB NIL VALUE: 0.07 [IU]/mL

## 2022-08-11 LAB — TB MITOGEN: TB MITOGEN VALUE: 10

## 2022-08-11 LAB — TB NIL: TB NIL VALUE: 0.07

## 2022-08-11 LAB — TB AG1: TB AG1 VALUE: 0.06

## 2022-09-03 NOTE — Unmapped (Signed)
[facility] Refill Coordination Note    Specialty Lite Medication(s) to be Shipped:   General Specialty: Repatha    Other medication(s) to be shipped: No additional medications requested for fill at this time     Kayla Knight, DOB: 04-14-1941  Phone: [phone] (home)       All above HIPAA information was verified with patient.     Was a Nurse, learning disability used for this call? No    Changes to medications: [name] reports no changes at this time.  Changes to insurance: No      REFERRAL TO PHARMACIST     Referral to the pharmacist: Not needed      [facility]     Shipping address confirmed in Epic.     Delivery Scheduled: Yes, Expected medication delivery date: 09/08/22.     Medication will be delivered via Same Day Courier to the prescription address in Epic WAM.    Unk Lightning   [facility] Specialty Technician

## 2022-09-08 MED FILL — REPATHA SURECLICK 140 MG/ML SUBCUTANEOUS PEN INJECTOR: SUBCUTANEOUS | 84 days supply | Qty: 6 | Fill #2

## 2022-09-16 ENCOUNTER — Ambulatory Visit: Admit: 2022-09-16 | Discharge: 2022-09-17 | Payer: MEDICARE | Attending: Internal Medicine | Primary: Internal Medicine

## 2022-09-16 DIAGNOSIS — R61 Generalized hyperhidrosis: Principal | ICD-10-CM

## 2022-09-16 DIAGNOSIS — R9389 Abnormal findings on diagnostic imaging of other specified body structures: Principal | ICD-10-CM

## 2022-09-16 MED ORDER — VENLAFAXINE ER 37.5 MG CAPSULE,EXTENDED RELEASE 24 HR
ORAL_CAPSULE | Freq: Every day | ORAL | 1 refills | 30 days | Status: CP
Start: 2022-09-16 — End: 2023-09-16

## 2022-09-16 NOTE — Unmapped (Signed)
Assessment and Plan      1. Unexplained night sweats    2. Abnormal findings on diagnostic imaging of other specified body structures      Preliminary labs were unrevealing--reviewed with patient in detail.   CT chest/abdomen/pelvis per orders to rule out lymphoma, occult infection, et Kayla Knight. In the meantime, trial of venlafaxine.  Indications, pharmacology, and potential adverse effects (to include significant interactions) were reviewed with the patient.   Follow up pending results.        No problem-specific Assessment & Plan notes found for this encounter.      Orders Placed This Encounter   Procedures    CT Chest Abdomen Pelvis W Wo Contrast       Requested Prescriptions     Signed Prescriptions Disp Refills    venlafaxine (EFFEXOR XR) 37.5 MG 24 hr capsule 30 capsule 1     Sig: Take 1 capsule (37.5 mg total) by mouth daily.       There are no discontinued medications.    Return in about 1 month (around 10/17/2022).    Signs and symptoms that should prompt return sooner than scheduled were reviewed with the patient.      [facility]MH Components:     Medication adherence and barriers to the treatment plan have been addressed. Opportunities to optimize healthy behaviors have been discussed. Patient / caregiver voiced understanding.        HPI      Chief Complaint    Chief Complaint   Patient presents with    Hot Flashes    Abdominal Pain     Left side        Kayla Knight presents for follow up of the following concern(s):    Hot flashes and left sided abdominal pain persist.        Health Maintenance reviewed - no intervention today      I have reviewed the patient's medical history in detail and updated the computerized patient record.              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)    Renal artery stenosis, native, bilateral (CMS-HCC)    Hyperlipidemia    Aftercare following joint replacement    Presence of left artificial knee joint    Hypokalemia    History of chest pain    Decreased GFR    Nonrheumatic aortic valve insufficiency    Non compliance w medication regimen    Endometrial thickening on ultrasound    Weakness of both hips    Right hip pain         Allergies:  Penicillins, Lisinopril, Statins-hmg-coa reductase inhibitors, and Sulfa (sulfonamide antibiotics)    Medications:   Outpatient Medications Prior to Visit   Medication Sig Dispense Refill    amLODIPine (NORVASC) 10 MG tablet Take 1 tablet (10 mg total) by mouth daily. 90 tablet 3    clopidogreL (PLAVIX) 75 mg tablet Take 1 tablet (75 mg total) by  mouth daily. 90 tablet 3    empty container (SHARPS-A-GATOR DISPOSAL SYSTEM) Misc Use as directed for sharps disposal 1 each 2    evolocumab (REPATHA SURECLICK) 140 mg/mL PnIj Inject the contents of 1 pen (140 mg) under the skin every fourteen (14) days. 6 mL 3    pantoprazole (PROTONIX) 40 MG tablet Take 1 tablet (40 mg total) by mouth daily. (Patient taking differently: Take 1 tablet (40 mg total) by mouth daily. [place] aneeded) 90 tablet 3    spironolactone (ALDACTONE) 50 MG tablet Take 1 tablet (50 mg total) by mouth daily. (Patient taking differently: Take 1 tablet (50 mg total) by mouth daily. Taking 1/2 tab daily) 90 tablet 3     No facility-administered medications prior to visit.       Medical History:  Past Medical History:   Diagnosis Date    Anemia     past history, over 40 yrs ago    Arthritis     hand, knees, joints    Asthma     past history, no recent attacks    Asthma     patient stated due to asthmatic medication    Cataract 2008    Surgery    Diabetes mellitus (CMS-HCC) Type II, on 9/15 patient stated she was not longer diabetic, not taking meds    Dry eye     Hearing impairment     Heart murmur 2016    [facility] Verdie [facility]    Heart valve disease     Heart mumur, pt reports diagnosis 10 yrs ago (05/10/20)    History of stent insertion of renal artery 01/29/2020    ASA indefinitely and plavix x 6 months per procedure note    Hypertension     pt reports taking meds since about 1995    Neuromuscular disorder (CMS-HCC) 2014    Feet & Legs    Tear of meniscus of knee     4.5 yrs ago    Urinary incontinence     seen a doctor about nighttime bathroom use (adressed in 2019)       Surgical History:  Past Surgical History:   Procedure Laterality Date    CATARACT EXTRACTION W/ INTRAOCULAR LENS IMPLANT Left 02/27/2016    EYE SURGERY  01/30/16    Cataract surgery is scheduled.    JOINT REPLACEMENT  Knee    KNEE CARTILAGE SURGERY Right 2007, 2008    x 2    KNEE SURGERY      pt reports total left knee joint replacement surgery around 2016, 2017    PLANTAR FASCIECTOMY Left     PR REVASCULARIZE FEM/POP ARTERY,ANGIOPLASTY/STENT N/A 01/29/2020    Procedure: Peripheral Angiography W Intervetion;  Surgeon: Alvira Philips, MD;  Location: [facility] CATH;  Service: Cardiology    PR REVASCULARIZE FEM/POP ARTERY,ANGIOPLASTY/STENT Right 12/26/2021    Procedure: right renal artery intervention;  Surgeon: Alvira Philips, MD;  Location: [facility] CATH;  Service: Cardiology    PR UPPER GI ENDOSCOPY,BIOPSY N/A 04/01/2021    Procedure: UGI ENDOSCOPY; WITH BIOPSY, SINGLE OR MULTIPLE;  Surgeon: Jarvis Morgan, MD;  Location: [facility];  Service: Gastroenterology    PR XCAPSL CTRC RMVL INSJ IO LENS PROSTH W/O ECP Right 02/13/2016    Procedure: EXTRACAPSULAR CATARACT REMOVAL W/INSERTION OF INTRAOCULAR LENS PROSTHESIS, MANUAL OR MECHANICAL TECHNIQUE;  Surgeon: Garey Ham, MD;  Location: [facility] OR [facility];  Service: Ophthalmology    PR XCAPSL CTRC RMVL INSJ  IO LENS PROSTH W/O ECP Left 02/27/2016    Procedure: EXTRACAPSULAR CATARACT REMOVAL W/INSERTION OF INTRAOCULAR LENS PROSTHESIS, MANUAL OR MECHANICAL TECHNIQUE;  Surgeon: Garey Ham, MD;  Location: [facility] OR [facility];  Service: Ophthalmology    RENAL ARTERY STENT  01/11/2020    ROOT CANAL      TUBAL LIGATION  1973    [facility]    WISDOM TOOTH EXTRACTION         Social History:  Tobacco use:   reports that she quit smoking about 38 years ago. Her smoking use included cigarettes. She has never used smokeless tobacco.  Alcohol use:   reports that she does not currently use alcohol.  Drug use:  reports no history of drug use.      Family History:  Family History   Problem Relation Age of Onset    Heart disease Mother     Hypertension Mother     Dementia Mother     Arthritis Mother     Heart disease Father     Thyroid disease Father     Glaucoma Father     Alcohol abuse Father     Heart attack Father 65    Arthritis Father     Hypertension Father         Massive heart attack    Arthritis Sister     Glaucoma Sister     Hypertension Sister     Diabetes Sister     Diabetes Daughter     Diabetes Paternal Aunt     No Known Problems Brother     No Known Problems Maternal Aunt     No Known Problems Maternal Uncle     No Known Problems Paternal Uncle     No Known Problems Maternal Grandmother     No Known Problems Maternal Grandfather     No Known Problems Paternal Grandmother     No Known Problems Paternal Grandfather     Arthritis Sister         Noticeable in extremities    COPD Sister     Depression Sister     Hypertension Sister     Glaucoma Sister     Asthma Daughter         Ocassional    Hypertension Daughter     Miscarriages / Stillbirths Daughter     Cancer Maternal Aunt         Died more than 18yrs ago    Early death Maternal Aunt     Diabetes Paternal Aunt         4 Aunts    Kidney disease Paternal Aunt     Amblyopia Neg Hx     Blindness Neg Hx     Cancer Neg Hx     Cataracts Neg Hx     Macular degeneration Neg Hx Retinal detachment Neg Hx     Strabismus Neg Hx     Stroke Neg Hx            Review of Systems:    Pertinent review of systems is noted in the HPI.           Physical Exam      BP 140/72 (BP Site: L Arm, BP Position: Sitting, BP Cuff Size: Large)  - Pulse 72  - Temp 36.8 ??C (98.2 ??F) (Oral)  - Ht 170.2 cm (5' 7.01)  - Wt 71.7 kg (158 lb)  - SpO2 98%  - BMI 24.74 kg/m??  BP Readings from Last 3 Encounters:   09/16/22 140/72   08/07/22 142/78   08/06/22 143/71       Wt Readings from Last 3 Encounters:   09/16/22 71.7 kg (158 lb)   08/07/22 71.7 kg (158 lb)   08/06/22 71.6 kg (157 lb 13.6 oz)       She appears well, in no apparent distress.  Alert and oriented times three, pleasant and cooperative. Vital signs are as documented in vital signs section.

## 2022-09-24 ENCOUNTER — Ambulatory Visit: Admit: 2022-09-24 | Discharge: 2022-09-25 | Payer: MEDICARE

## 2022-09-24 DIAGNOSIS — U071 COVID-19: Principal | ICD-10-CM

## 2022-09-24 MED ORDER — NIRMATRELVIR 300 MG (150 MG X2)-RITONAVIR 100 MG TABLET,DOSE PACK
ORAL_TABLET | 0 refills | 0 days | Status: CP
Start: 2022-09-24 — End: ?

## 2022-09-24 NOTE — Unmapped (Signed)
You may try Coricidin HBP as needed.

## 2022-09-24 NOTE — Unmapped (Signed)
Name:  Kayla Knight  DOB: August 06, 1941  Date: 09/24/2022    ASSESSMENT/PLAN:  Kayla Knight was seen today for uri.    Diagnoses and all orders for this visit:    COVID-19  -     POCT Rapid Influenza A/B, RSV, SARS-CoV2 NAA  -     nirmatrelvir-ritonavir (PAXLOVID) 300 mg (150 mg x 2)-100 mg tablet; See package instructions.      Kayla Knight is a 81 y.o. female who presents with COVID 19. Patient is eligible for Paxlovid based on age/co-morbidities. It interacts with her clopidogrel so advised she confirm with Vascular that they are okay with her taking this. She was advised to take half of her normal dose of amlodipine and closely monitor BP. If BP > 150/100 on consecutive days restart full dose. If BP < 100/60 or any dizziness please contact PCP. Patient given home care instructions and return precautions. We also discussed isolation period per latest CDC guidelines.   ------------------------------------------------------------------------------    Chief Complaint   Patient presents with    URI     Cough, runny nose, and weak X 3 days.        HPI: Kayla Knight is a 81 y.o. female who presents with cough, runny nose and weakness for the past three days. She's also had some decreased appetite. She says that she's been getting plenty to drink. She had breakfast this morning.     Her BP is high today but she missed her dose of amlodipine yesterday and this morning. She denies any shortness of breath, fevers. She has had some malaise and fatigue.     She's tried Mucinex for her symptoms. Didn't find it very helpful.     ROS:  Review of systems as above.  Rest of review of systems negative unless otherwise noted as per HPI.    I have reviewed past medical, surgical, medications, allergies, social and family histories today and updated them in Epic where appropriate.    PMH:  Past Medical History:   Diagnosis Date    Anemia     past history, over 40 yrs ago    Arthritis     hand, knees, joints    Asthma     past history, no recent attacks    Asthma     patient stated due to asthmatic medication    Cataract 2008    Surgery    Diabetes mellitus (CMS-HCC)     Type II, on 9/15 patient stated she was not longer diabetic, not taking meds    Dry eye     Hearing impairment     Heart murmur 2016    [facility]. Verdie [facility]ire @ [facility]    Heart valve disease     Heart mumur, pt reports diagnosis 10 yrs ago (05/10/20)    History of stent insertion of renal artery 01/29/2020    ASA indefinitely and plavix x 6 months per procedure note    Hypertension     pt reports taking meds since about 1995    Neuromuscular disorder (CMS-HCC) 2014    Feet & Legs    Tear of meniscus of knee     4.5 yrs ago    Urinary incontinence     seen a doctor about nighttime bathroom use (adressed in 2019)       SURGICAL HX:  Past Surgical History:   Procedure Laterality Date    CATARACT EXTRACTION W/ INTRAOCULAR LENS IMPLANT Left 02/27/2016    EYE SURGERY  01/30/16    Cataract surgery is scheduled.    JOINT REPLACEMENT  Knee    KNEE CARTILAGE SURGERY Right 2007, 2008    x 2    KNEE SURGERY      pt reports total left knee joint replacement surgery around 2016, 2017    PLANTAR FASCIECTOMY Left     PR REVASCULARIZE FEM/POP ARTERY,ANGIOPLASTY/STENT N/A 01/29/2020    Procedure: Peripheral Angiography W Intervetion;  Surgeon: Alvira Philips, MD;  Location: [facility] CATH;  Service: Cardiology    PR REVASCULARIZE FEM/POP ARTERY,ANGIOPLASTY/STENT Right 12/26/2021    Procedure: right renal artery intervention;  Surgeon: Alvira Philips, MD;  Location: [facility] CATH;  Service: Cardiology    PR UPPER GI ENDOSCOPY,BIOPSY N/A 04/01/2021    Procedure: UGI ENDOSCOPY; WITH BIOPSY, SINGLE OR MULTIPLE;  Surgeon: Jarvis Morgan, MD;  Location: [facility];  Service: Gastroenterology    PR XCAPSL CTRC RMVL INSJ IO LENS PROSTH W/O ECP Right 02/13/2016    Procedure: EXTRACAPSULAR CATARACT REMOVAL W/INSERTION OF INTRAOCULAR LENS PROSTHESIS, MANUAL OR MECHANICAL TECHNIQUE;  Surgeon: Garey Ham, MD;  Location: [facility] OR [facility];  Service: Ophthalmology    PR XCAPSL CTRC RMVL INSJ IO LENS PROSTH W/O ECP Left 02/27/2016    Procedure: EXTRACAPSULAR CATARACT REMOVAL W/INSERTION OF INTRAOCULAR LENS PROSTHESIS, MANUAL OR MECHANICAL TECHNIQUE;  Surgeon: Garey Ham, MD;  Location: [facility];  Service: Ophthalmology    RENAL ARTERY STENT  01/11/2020    ROOT CANAL      TUBAL LIGATION  1973    [facility]    WISDOM TOOTH EXTRACTION         MEDS:    Current Outpatient Medications:     amLODIPine (NORVASC) 10 MG tablet, Take 1 tablet (10 mg total) by mouth daily., Disp: 90 tablet, Rfl: 3    clopidogreL (PLAVIX) 75 mg tablet, Take 1 tablet (75 mg total) by mouth daily., Disp: 90 tablet, Rfl: 3    empty container (SHARPS-A-GATOR DISPOSAL SYSTEM) Misc, Use as directed for sharps disposal, Disp: 1 each, Rfl: 2    evolocumab (REPATHA SURECLICK) 140 mg/mL PnIj, Inject the contents of 1 pen (140 mg) under the skin every fourteen (14) days., Disp: 6 mL, Rfl: 3    pantoprazole (PROTONIX) 40 MG tablet, Take 1 tablet (40 mg total) by mouth daily. (Patient taking differently: Take 1 tablet (40 mg total) by mouth daily. [place] aneeded), Disp: 90 tablet, Rfl: 3    spironolactone (ALDACTONE) 50 MG tablet, Take 1 tablet (50 mg total) by mouth daily. (Patient taking differently: Take 1 tablet (50 mg total) by mouth daily. Taking 1/2 tab daily), Disp: 90 tablet, Rfl: 3    venlafaxine (EFFEXOR XR) 37.5 MG 24 hr capsule, Take 1 capsule (37.5 mg total) by mouth daily., Disp: 30 capsule, Rfl: 1    nirmatrelvir-ritonavir (PAXLOVID) 300 mg (150 mg x 2)-100 mg tablet, See package instructions., Disp: 30 tablet, Rfl: 0    ALL:  Allergies   Allergen Reactions    Penicillins Itching and Swelling    Lisinopril Cough    Statins-Hmg-Coa Reductase Inhibitors      Ineffective at controlling cholesterol when tried in the past.    Sulfa (Sulfonamide Antibiotics) Itching       SH:  Social History Tobacco Use    Smoking status: Former     Current packs/day: 0.00     Types: Cigarettes     Quit date: 05/19/1984     Years since quitting: 38.3  Passive exposure: Past    Smokeless tobacco: Never   Vaping Use    Vaping Use: Never used   Substance Use Topics    Alcohol use: Not Currently    Drug use: Never       FH:  Family History   Problem Relation Age of Onset    Heart disease Mother     Hypertension Mother     Dementia Mother     Arthritis Mother     Heart disease Father     Thyroid disease Father     Glaucoma Father     Alcohol abuse Father     Heart attack Father 3    Arthritis Father     Hypertension Father         Massive heart attack    Arthritis Sister     Glaucoma Sister     Hypertension Sister     Diabetes Sister     Diabetes Daughter     Diabetes Paternal Aunt     No Known Problems Brother     No Known Problems Maternal Aunt     No Known Problems Maternal Uncle     No Known Problems Paternal Uncle     No Known Problems Maternal Grandmother     No Known Problems Maternal Grandfather     No Known Problems Paternal Grandmother     No Known Problems Paternal Grandfather     Arthritis Sister         Noticeable in extremities    COPD Sister     Depression Sister     Hypertension Sister     Glaucoma Sister     Asthma Daughter         Ocassional    Hypertension Daughter     Miscarriages / Stillbirths Daughter     Cancer Maternal Aunt         Died more than 70yrs ago    Early death Maternal Aunt     Diabetes Paternal Aunt         4 Aunts    Kidney disease Paternal Aunt     Amblyopia Neg Hx     Blindness Neg Hx     Cancer Neg Hx     Cataracts Neg Hx     Macular degeneration Neg Hx     Retinal detachment Neg Hx     Strabismus Neg Hx     Stroke Neg Hx        VITALS:  Vitals:    09/24/22 1521   BP: 173/82   Pulse: 73   Resp: 18   Temp: 36.8 ??C (98.3 ??F)   SpO2: 98%     Body mass index is 24.75 kg/m??.    Physical Exam  Constitutional:       General: She is not in acute distress.  HENT:      Head: Normocephalic and atraumatic.      Nose: Nose normal.   Eyes:      Conjunctiva/sclera: Conjunctivae normal.      Pupils: Pupils are equal, round, and reactive to light.   Cardiovascular:      Rate and Rhythm: Normal rate and regular rhythm.      Heart sounds: No murmur heard.     No friction rub. No gallop.   Pulmonary:      Effort: Pulmonary effort is normal. No respiratory distress.      Breath sounds: Normal breath sounds. No stridor. No wheezing, rhonchi or rales.  Musculoskeletal:         General: Normal range of motion.   Skin:     General: Skin is warm and dry.   Neurological:      General: No focal deficit present.      Mental Status: She is alert and oriented to person, place, and time.      Cranial Nerves: Cranial nerves 2-12 are intact.   Psychiatric:         Mood and Affect: Mood and affect normal.         Behavior: Behavior normal.         Judgment: Judgment normal.         TEST  RESULTS:    Results for orders placed or performed in visit on 09/24/22   POCT Rapid Influenza A/B, RSV, SARS-CoV2 NAA   Result Value Ref Range    POCT SARS-CoV-2 NAA Positive (A) Negative    Rapid Influenza A NAA, POC Negative Negative    Rapid Influenza B NAA, POC Negative Negative    Rapid RSV NAA, POC Negative Negative    POC FLURVID COMMENT  COMMENT     This test uses Cepheid Xpert SARS-CoV-2/Flu/RSV PCR assay; FDA has granted Emergency Use Authorization for this test.       SCREENINGS:  SDOH:   No SDOH interventions indicated during today's visit     PATIENT DISPOSITION:    Follow-up with PCP

## 2022-09-25 NOTE — Unmapped (Signed)
Pt called the clinic regarding to confirm her lab result. Nurse discussed the covid result and nurse told her the provider will review the result and send her message regarding to the rx. Nurse discussed the quarantine requirement.   Please review and advise.

## 2022-09-25 NOTE — Unmapped (Signed)
You have tested positive for COVID 19. I am sending Paxlovid as discussed over the phone. Here are the recommendations I've made regarding your medication.   - contact your vascular surgeon to make sure its okay that the Paxlovid will reduce the efficacy of your clopidogrel  - take your full dose of amlodipine today. If you start Paxlovid tomorrow, start by taking a 1/2 dose of your amlodipine. Monitor your BP. If your BP is higher than 150/100 on consecutive days, increase back to 10 mg. Continue to monitor your BP and contact your PCP if it becomes lower than 100/60 or you start having any dizziness.

## 2022-09-25 NOTE — Unmapped (Signed)
Called and stated that she was seen in Urgent Care and tested positive for Covid 19.  Stated that provider in Urgent Care told her that the medication that was given to her for Covid interferes with Plavix.  Provider asked pt to check with Cardiology to see if it is okay to only take 1/2 of her Plavix dose. I attempted to contact pt but she did not answer.  Voice message left with pt.  I will forward to provider.

## 2022-10-08 NOTE — Unmapped (Signed)
Patient called in today regarding the necessity of her visit 10/21/2022 @ 10:20 am. She is not sure after having the MRI if [facility] still needs to see her.    She is asking for a call back ASAP incase it is not necessary. She has an relative that she wants to go see.    Her best call back number is [phone]

## 2022-10-08 NOTE — Unmapped (Signed)
Please advise if she still needs to come in

## 2022-10-09 NOTE — Unmapped (Signed)
Patient calling back today in regards to my chart that was sent yesterday. She is wanting to go to [place] to visit a family member after [employer] Day next week and wanted to know if anything was concerning enough to hold her back from traveling. Advised that Dr. Rock Nephew is out of office today and that her message was sent to get back to her next Tuesday. She verbalized understanding.

## 2022-10-14 NOTE — Unmapped (Signed)
No reason not to travel to [place].  Dora Sims, MD

## 2022-10-14 NOTE — Unmapped (Signed)
Spoke with patient and was advised ok to travel. She verbalized understanding.

## 2022-10-21 ENCOUNTER — Ambulatory Visit: Admit: 2022-10-21 | Discharge: 2022-10-22 | Payer: MEDICARE | Attending: Internal Medicine | Primary: Internal Medicine

## 2022-10-21 DIAGNOSIS — I700 Atherosclerosis of aorta: Principal | ICD-10-CM

## 2022-10-21 DIAGNOSIS — E1169 Type 2 diabetes mellitus with other specified complication: Principal | ICD-10-CM

## 2022-10-21 DIAGNOSIS — R61 Generalized hyperhidrosis: Principal | ICD-10-CM

## 2022-10-21 DIAGNOSIS — E785 Hyperlipidemia, unspecified: Principal | ICD-10-CM

## 2022-10-21 LAB — ALBUMIN / CREATININE URINE RATIO
ALBUMIN QUANT URINE: <0.3 mg/dL
CREATININE, URINE: 284.7 mg/dL

## 2022-10-21 LAB — HEMOGLOBIN A1C
ESTIMATED AVERAGE GLUCOSE: 128 mg/dL
HEMOGLOBIN A1C: 6.1 % — ABNORMAL HIGH (ref 4.8–5.6)

## 2022-10-21 NOTE — Unmapped (Signed)
Assessment and Plan      1. Unexplained night sweats    2. Atherosclerosis of aorta (CMS-HCC)    3. 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  Follow up as scheduled with appropriate provider or sooner prn.    Discussed concerns relative to atherosclerosis of aorta.  On Repatha.  Follow up as scheduled with appropriate provider or sooner prn.     Labs per orders. Follow up pending results.      No problem-specific Assessment & Plan notes found for this encounter.      Orders Placed This Encounter   Procedures    HM DIABETES FOOT EXAM    Hemoglobin A1c    Albumin/creatinine urine ratio       Requested Prescriptions      No prescriptions requested or ordered in this encounter       Medications Discontinued During This Encounter   Medication Reason    nirmatrelvir-ritonavir (PAXLOVID) 300 mg (150 mg x 2)-100 mg tablet Therapy completed       Return for Annual physical or MWV.    Signs and symptoms that should prompt return sooner than scheduled were reviewed with the patient.      PCMH Components:     Medication adherence and barriers to the treatment plan have been addressed. Opportunities to optimize healthy behaviors have been discussed. Patient / caregiver voiced understanding.        HPI      Chief Complaint    Chief Complaint   Patient presents with    Follow-up       Notes fewer hot flashes at night and during the day.  (S)He denies adverse effects from medication.     Has questions about atherosclerosis of aorta noted on CT. No claudication or skin lesions.       Health Maintenance reviewed - per orders      I have reviewed the patient's medical history in detail and updated the computerized patient record.              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)    Renal artery stenosis, native, bilateral (CMS-HCC)    Hyperlipidemia    Aftercare following joint replacement    Presence of left artificial knee joint    Hypokalemia    History of chest pain    Decreased GFR    Nonrheumatic aortic valve insufficiency    Non compliance w medication regimen    Endometrial thickening on ultrasound    Weakness of both hips    Right hip pain         Allergies:  Penicillins, Lisinopril, Statins-hmg-coa reductase inhibitors, and Sulfa (sulfonamide antibiotics)    Medications:   Outpatient Medications Prior to Visit   Medication Sig Dispense Refill    amLODIPine (NORVASC) 10 MG tablet Take 1 tablet (10 mg total) by mouth daily. 90 tablet 3    clopidogreL (  PLAVIX) 75 mg tablet Take 1 tablet (75 mg total) by mouth daily. 90 tablet 3    empty container (SHARPS-A-GATOR DISPOSAL SYSTEM) Misc Use as directed for sharps disposal 1 each 2    evolocumab (REPATHA SURECLICK) 140 mg/mL PnIj Inject the contents of 1 pen (140 mg) under the skin every fourteen (14) days. 6 mL 3    pantoprazole (PROTONIX) 40 MG tablet Take 1 tablet (40 mg total) by mouth daily. (Patient taking differently: Take 1 tablet (40 mg total) by mouth daily. [place] aneeded) 90 tablet 3    spironolactone (ALDACTONE) 50 MG tablet Take 1 tablet (50 mg total) by mouth daily. (Patient taking differently: Take 1 tablet (50 mg total) by mouth daily. Taking 1/2 tab daily) 90 tablet 3    venlafaxine (EFFEXOR XR) 37.5 MG 24 hr capsule Take 1 capsule (37.5 mg total) by mouth daily. 30 capsule 1    nirmatrelvir-ritonavir (PAXLOVID) 300 mg (150 mg x 2)-100 mg tablet See package instructions. (Patient not taking: Reported on 10/21/2022) 30 tablet 0     No facility-administered medications prior to visit.       Medical History:  Past Medical History:   Diagnosis Date    Anemia     past history, over 40 yrs ago    Arthritis     hand, knees, joints    Asthma     past history, no recent attacks    Asthma     patient stated due to asthmatic medication    Cataract 2008    Surgery    Diabetes mellitus (CMS-HCC)     Type II, on 9/15 patient stated she was not longer diabetic, not taking meds    Dry eye     Hearing impairment     Heart murmur 2016    [facility]    Heart valve disease     Heart mumur, pt reports diagnosis 10 yrs ago (05/10/20)    History of stent insertion of renal artery 01/29/2020    ASA indefinitely and plavix x 6 months per procedure note    Hypertension     pt reports taking meds since about 1995    Neuromuscular disorder (CMS-HCC) 2014    Feet & Legs    Tear of meniscus of knee     4.5 yrs ago    Urinary incontinence     seen a doctor about nighttime bathroom use (adressed in 2019)       Surgical History:  Past Surgical History:   Procedure Laterality Date    CATARACT EXTRACTION W/ INTRAOCULAR LENS IMPLANT Left 02/27/2016    EYE SURGERY  01/30/16    Cataract surgery is scheduled.    JOINT REPLACEMENT  Knee    KNEE CARTILAGE SURGERY Right 2007, 2008    x 2    KNEE SURGERY      pt reports total left knee joint replacement surgery around 2016, 2017    PLANTAR FASCIECTOMY Left     PR REVASCULARIZE FEM/POP ARTERY,ANGIOPLASTY/STENT N/A 01/29/2020    Procedure: Peripheral Angiography W Intervetion;  Surgeon: Alvira Philips, MD;  Location: [facility] CATH;  Service: Cardiology    PR REVASCULARIZE FEM/POP ARTERY,ANGIOPLASTY/STENT Right 12/26/2021    Procedure: right renal artery intervention;  Surgeon: Alvira Philips, MD;  Location: [facility] CATH;  Service: Cardiology    PR UPPER GI ENDOSCOPY,BIOPSY N/A 04/01/2021    Procedure: UGI ENDOSCOPY; WITH BIOPSY, SINGLE OR MULTIPLE;  Surgeon: Jarvis Morgan, MD;  Location: [facility] [place];  Service: Gastroenterology    PR XCAPSL CTRC RMVL INSJ IO LENS PROSTH W/O ECP Right 02/13/2016    Procedure: EXTRACAPSULAR CATARACT REMOVAL W/INSERTION OF INTRAOCULAR LENS PROSTHESIS, MANUAL OR MECHANICAL TECHNIQUE;  Surgeon: Garey Ham, MD;  Location: [facility] OR [facility];  Service: Ophthalmology    PR XCAPSL CTRC RMVL INSJ IO LENS PROSTH W/O ECP Left 02/27/2016    Procedure: EXTRACAPSULAR CATARACT REMOVAL W/INSERTION OF INTRAOCULAR LENS PROSTHESIS, MANUAL OR MECHANICAL TECHNIQUE;  Surgeon: Garey Ham, MD;  Location: [facility] OR [facility];  Service: Ophthalmology    RENAL ARTERY STENT  01/11/2020    ROOT CANAL      TUBAL LIGATION  1973    [facility]    WISDOM TOOTH EXTRACTION         Social History:  Tobacco use:   reports that she quit smoking about 38 years ago. Her smoking use included cigarettes. She has been exposed to tobacco smoke. She has never used smokeless tobacco.  Alcohol use:   reports that she does not currently use alcohol.  Drug use:  reports no history of drug use.      Family History:  Family History   Problem Relation Age of Onset    Heart disease Mother     Hypertension Mother     Dementia Mother     Arthritis Mother     Heart disease Father     Thyroid disease Father     Glaucoma Father     Alcohol abuse Father     Heart attack Father 13    Arthritis Father     Hypertension Father         Massive heart attack    Arthritis Sister     Glaucoma Sister     Hypertension Sister     Diabetes Sister     Diabetes Daughter     Diabetes Paternal Aunt     No Known Problems Brother     No Known Problems Maternal Aunt     No Known Problems Maternal Uncle     No Known Problems Paternal Uncle     No Known Problems Maternal Grandmother     No Known Problems Maternal Grandfather     No Known Problems Paternal Grandmother     No Known Problems Paternal Grandfather     Arthritis Sister         Noticeable in extremities    COPD Sister     Depression Sister     Hypertension Sister     Glaucoma Sister     Asthma Daughter         Ocassional Hypertension Daughter     Miscarriages / Stillbirths Daughter     Cancer Maternal Aunt         Died more than 71yrs ago    Early death Maternal Aunt     Diabetes Paternal Aunt         4 Aunts    Kidney disease Paternal Aunt     Amblyopia Neg Hx     Blindness Neg Hx     Cancer Neg Hx     Cataracts Neg Hx     Macular degeneration Neg Hx     Retinal detachment Neg Hx     Strabismus Neg Hx     Stroke Neg Hx            Review of Systems:    Pertinent review of systems is noted in  the HPI.           Physical Exam      BP 146/78 (BP Site: L Arm, BP Position: Sitting, BP Cuff Size: Large)  - Pulse 71  - Temp 36.6 ??C (97.9 ??F) (Oral)  - Ht 170.2 cm (5' 7.01)  - Wt 71.7 kg (158 lb)  - SpO2 98%  - BMI 24.74 kg/m??       BP Readings from Last 3 Encounters:   10/21/22 146/78   09/24/22 173/82   09/16/22 140/72       Wt Readings from Last 3 Encounters:   10/21/22 71.7 kg (158 lb)   09/24/22 71.7 kg (158 lb)   09/16/22 71.7 kg (158 lb)       She appears well, in no apparent distress.  Alert and oriented times three, pleasant and cooperative. Vital signs are as documented in vital signs section. Cor is regular rate and rhythm with 2/6 murmur unchanged.  Lungs are clear throughout with normal respiratory effort.

## 2022-11-02 NOTE — Unmapped (Signed)
DUE ON 14TH - HAS 2 PENS LEFT.    [facility] Refill Coordination Note    Specialty Lite Medication(s) to be Shipped:   General Specialty: Repatha    Other medication(s) to be shipped: No additional medications requested for fill at this time     Kayla Knight, DOB: 1941/07/26  Phone: [phone] (home)       All above HIPAA information was verified with patient.     Was a Nurse, learning disability used for this call? No    Changes to medications: Nesly reports no changes at this time.  Changes to insurance: Yes: [employer], needs pa - transition allowed      REFERRAL TO PHARMACIST     Referral to the pharmacist: Not needed      [facility]     Shipping address confirmed in Epic.     Delivery Scheduled: Yes, Expected medication delivery date: 2/6.     Medication will be delivered via Same Day Courier to the prescription address in Epic WAM.    Rudene Poulsen A Desiree Lucy [facility] Specialty Pharmacist

## 2022-11-03 DIAGNOSIS — E782 Mixed hyperlipidemia: Principal | ICD-10-CM

## 2022-11-03 MED FILL — REPATHA SURECLICK 140 MG/ML SUBCUTANEOUS PEN INJECTOR: SUBCUTANEOUS | 28 days supply | Qty: 2 | Fill #3

## 2022-11-20 NOTE — Unmapped (Signed)
Key: [id number]  Prior authorization approved for Repatha   starting on 09/28/2022 and ending on 09/28/2023.

## 2022-11-23 NOTE — Unmapped (Signed)
Pt is requesting referral to see a neurologist for pt's occipital neuralgia. Pt has not discussed this issue with Dr. Rock Nephew, but would be willing to meet for an appt if necessary for the referral.

## 2022-11-24 ENCOUNTER — Ambulatory Visit: Admit: 2022-11-24 | Discharge: 2022-11-25 | Payer: MEDICARE | Attending: Internal Medicine | Primary: Internal Medicine

## 2022-11-24 DIAGNOSIS — M5481 Occipital neuralgia: Principal | ICD-10-CM

## 2022-11-24 NOTE — Unmapped (Signed)
Assessment and Plan      1. Occipital neuralgia of left side        Discussed treatment options in detail--defers all.  I don't think it is related to hair loss.  Follow up prn.      No problem-specific Assessment & Plan notes found for this encounter.      No orders of the defined types were placed in this encounter.      Requested Prescriptions      No prescriptions requested or ordered in this encounter       There are no discontinued medications.    No follow-ups on file.    Signs and symptoms that should prompt return sooner than scheduled were reviewed with the patient.      PCMH Components:     Medication adherence and barriers to the treatment plan have been addressed. Opportunities to optimize healthy behaviors have been discussed. Patient / caregiver voiced understanding.        HPI      Chief Complaint    Chief Complaint   Patient presents with    Skin Problem     Feels crawling sensation, dull pain, back of left side of head, hair loss episodes are more frequent       Kayla Knight presents for evaluation and management of the following concern(s):    She notes creepy-crawly sensation on left posterior neck and occipital scalp.  It has grown more frequent.  Now there is pain.  No numbness, tingling, weakness in  upper extremities.   She denies visual or hearing changes, dizziness.        Health Maintenance reviewed - no intervention today      I have reviewed the patient's medical history in detail and updated the computerized patient record.              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)    Neck pain    Encounter for subsequent annual wellness visit (AWV) in Medicare patient    Lipoma of torso    Skin lesion    Hearing loss    Lichen sclerosus et atrophicus of the vulva    Renal artery stenosis, native, bilateral (CMS-HCC)    Hyperlipidemia    Aftercare following joint replacement    Presence of left artificial knee joint    Hypokalemia    Decreased GFR    Nonrheumatic aortic valve insufficiency    Endometrial thickening on ultrasound    Weakness of both hips    Right hip pain    Hot flashes         Allergies:  Penicillins, Lisinopril, Statins-hmg-coa reductase inhibitors, and Sulfa (sulfonamide antibiotics)    Medications:   Outpatient Medications Prior to Visit   Medication Sig Dispense Refill    amLODIPine (NORVASC) 10 MG tablet Take 1 tablet (10 mg total) by mouth daily. 90 tablet 3    clopidogreL (PLAVIX) 75 mg tablet Take 1 tablet (75 mg total) by mouth daily. 90 tablet 3    evolocumab (REPATHA SURECLICK) 140 mg/mL PnIj Inject the contents of 1 pen (140 mg) under the skin every fourteen (14) days. 6 mL 3    spironolactone (ALDACTONE) 50 MG tablet Take 1 tablet (50 mg total) by mouth daily. (Patient taking  differently: Take 1 tablet (50 mg total) by mouth daily. Taking 1/2 tab daily) 90 tablet 3    pantoprazole (PROTONIX) 40 MG tablet Take 1 tablet (40 mg total) by mouth daily. 90 tablet 3    venlafaxine (EFFEXOR XR) 37.5 MG 24 hr capsule Take 1 capsule (37.5 mg total) by mouth daily. 30 capsule 1    empty container (SHARPS-A-GATOR DISPOSAL SYSTEM) Misc Use as directed for sharps disposal 1 each 2     No facility-administered medications prior to visit.       Medical History:  Past Medical History:   Diagnosis Date    Anemia     past history, over 40 yrs ago    Arthritis     hand, knees, joints    Asthma     past history, no recent attacks    Asthma     patient stated due to asthmatic medication    Cataract 2008    Surgery    Diabetes mellitus (CMS-HCC)     Type II, on 9/15 patient stated she was not longer diabetic, not taking meds    Dry eye     Hearing impairment     Heart murmur 2016    Dr. Verdie Shire @ [facility]    Heart valve disease     Heart mumur, pt reports diagnosis 10 yrs ago (05/10/20)    History of stent insertion of renal artery 01/29/2020    ASA indefinitely and plavix x 6 months per procedure note    Hypertension     pt reports taking meds since about 1995    Neuromuscular disorder (CMS-HCC) 2014    Feet & Legs    Tear of meniscus of knee     4.5 yrs ago    Urinary incontinence     seen a doctor about nighttime bathroom use (adressed in 2019)       Surgical History:  Past Surgical History:   Procedure Laterality Date    CATARACT EXTRACTION W/ INTRAOCULAR LENS IMPLANT Left 02/27/2016    EYE SURGERY  01/30/16    Cataract surgery is scheduled.    JOINT REPLACEMENT  Knee    KNEE CARTILAGE SURGERY Right 2007, 2008    x 2    KNEE SURGERY      pt reports total left knee joint replacement surgery around 2016, 2017    PLANTAR FASCIECTOMY Left     PR REVASCULARIZE FEM/POP ARTERY,ANGIOPLASTY/STENT N/A 01/29/2020    Procedure: Peripheral Angiography W Intervetion;  Surgeon: Alvira Philips, MD;  Location: [facility] CATH;  Service: Cardiology    PR REVASCULARIZE FEM/POP ARTERY,ANGIOPLASTY/STENT Right 12/26/2021    Procedure: right renal artery intervention;  Surgeon: Alvira Philips, MD;  Location: [facility] CATH;  Service: Cardiology    PR UPPER GI ENDOSCOPY,BIOPSY N/A 04/01/2021    Procedure: UGI ENDOSCOPY; WITH BIOPSY, SINGLE OR MULTIPLE;  Surgeon: Jarvis Morgan, MD;  Location: [facility];  Service: Gastroenterology    PR XCAPSL CTRC RMVL INSJ IO LENS PROSTH W/O ECP Right 02/13/2016    Procedure: EXTRACAPSULAR CATARACT REMOVAL W/INSERTION OF INTRAOCULAR LENS PROSTHESIS, MANUAL OR MECHANICAL TECHNIQUE;  Surgeon: Garey Ham, MD;  [facility];  Service: Ophthalmology    PR XCAPSL CTRC RMVL INSJ IO LENS PROSTH W/O ECP Left 02/27/2016    Procedure: EXTRACAPSULAR CATARACT REMOVAL W/INSERTION OF INTRAOCULAR LENS PROSTHESIS, MANUAL OR MECHANICAL TECHNIQUE;  Surgeon: Garey Ham, MD;  Location: [facility] OR [facility];  Service: Ophthalmology  RENAL ARTERY STENT  01/11/2020    ROOT CANAL      TUBAL LIGATION  1973    [facility]    WISDOM TOOTH EXTRACTION         Social History:  Tobacco use:   reports that she quit smoking about 38 years ago. Her smoking use included cigarettes. She has been exposed to tobacco smoke. She has never used smokeless tobacco.  Alcohol use:   reports that she does not currently use alcohol.  Drug use:  reports no history of drug use.      Family History:  Family History   Problem Relation Age of Onset    Heart disease Mother     Hypertension Mother     Dementia Mother     Arthritis Mother     Heart disease Father     Thyroid disease Father     Glaucoma Father     Alcohol abuse Father     Heart attack Father 37    Arthritis Father     Hypertension Father         Massive heart attack    Arthritis Sister     Glaucoma Sister     Hypertension Sister     Diabetes Sister     Diabetes Daughter     Diabetes Paternal Aunt     No Known Problems Brother     No Known Problems Maternal Aunt     No Known Problems Maternal Uncle     No Known Problems Paternal Uncle     No Known Problems Maternal Grandmother     No Known Problems Maternal Grandfather     No Known Problems Paternal Grandmother     No Known Problems Paternal Grandfather     Arthritis Sister         Noticeable in extremities    COPD Sister     Depression Sister     Hypertension Sister     Glaucoma Sister     Asthma Daughter         Ocassional    Hypertension Daughter     Miscarriages / Stillbirths Daughter     Cancer Maternal Aunt         Died more than 37yrs ago    Early death Maternal Aunt     Diabetes Paternal Aunt         4 Aunts    Kidney disease Paternal Aunt     Amblyopia Neg Hx     Blindness Neg Hx     Cancer Neg Hx     Cataracts Neg Hx     Macular degeneration Neg Hx     Retinal detachment Neg Hx     Strabismus Neg Hx     Stroke Neg Hx            Review of Systems:    Pertinent review of systems is noted in the HPI.           Physical Exam      BP 140/68 (BP Site: L Arm, BP Position: Sitting, BP Cuff Size: Small)  - Pulse 76  - Temp 36.8 ??C (98.2 ??F) (Oral)  - Resp 14  - Wt 70.8 kg (156 lb)  - SpO2 98%  - BMI 24.43 kg/m??       .  She appears well, in no apparent distress.  Alert and oriented times three, pleasant and cooperative. Vital signs are as documented in vital signs section.  Facial hyperpigmentaiton unchanged.  No tenderness over trigeminal nerve.  CN are grossly intact.

## 2022-11-24 NOTE — Unmapped (Signed)
Pt is now scheduled to be seen by Dr PT today (2/27) for this issue

## 2022-12-12 MED ORDER — VENLAFAXINE ER 37.5 MG CAPSULE,EXTENDED RELEASE 24 HR
ORAL_CAPSULE | Freq: Every day | ORAL | 0 refills | 0 days
Start: 2022-12-12 — End: ?

## 2022-12-14 MED ORDER — VENLAFAXINE ER 37.5 MG CAPSULE,EXTENDED RELEASE 24 HR
ORAL_CAPSULE | Freq: Every day | ORAL | 1 refills | 90 days | Status: CP
Start: 2022-12-14 — End: 2023-12-14

## 2022-12-14 NOTE — Unmapped (Signed)
Patient is requesting the following refill  Requested Prescriptions     Pending Prescriptions Disp Refills    venlafaxine (EFFEXOR-XR) 37.5 MG 24 hr capsule [Pharmacy Med Name: VENLAFAXINE HCL ER 37.5MG  CAP] 30 capsule 0     Sig: TAKE 1 CAPSULE (37.5 MG TOTAL) BY MOUTH DAILY.       Recent Visits  Date Type Provider Dept   11/24/22 Office Visit Powell-Tillman, Valetta Close, MD [facility] Internal Medicine   10/21/22 Office Visit Powell-Tillman, Valetta Close, MD [facility] Internal Medicine   09/16/22 Office Visit Powell-Tillman, Valetta Close, MD [facility] Internal Medicine   08/07/22 Office Visit Powell-Tillman, Valetta Close, MD [facility] Internal Medicine   03/30/22 Office Visit Powell-Tillman, Valetta Close, MD [facility] Internal Medicine   Showing recent visits within past 365 days with a meds authorizing provider and meeting all other requirements  Future Appointments  No visits were found meeting these conditions.  Showing future appointments within next 365 days with a meds authorizing provider and meeting all other requirements       Labs: PHQ9:        No data to display

## 2022-12-21 NOTE — Unmapped (Signed)
[facility] Refill Coordination Note    Specialty Lite Medication(s) to be Shipped:   General Specialty: Repatha    Other medication(s) to be shipped: No additional medications requested for fill at this time     Sephanie Rodman, DOB: 10-Jun-1941  Phone: [phone] (home)       All above HIPAA information was verified with patient.     Was a Nurse, learning disability used for this call? No    Changes to medications: Sherlon reports no changes at this time.  Changes to insurance: No      REFERRAL TO PHARMACIST     Referral to the pharmacist: Not needed      [facility]     Shipping address confirmed in Epic.     Delivery Scheduled: Yes, Expected medication delivery date: 12/29/22.     Medication will be delivered via Same Day Courier to the prescription address in Epic WAM.    Moshe Salisbury   [facility] Specialty Technician

## 2022-12-22 NOTE — Unmapped (Signed)
Fax received from: [facility] medicare advantage special needs plan  Given to provider for review and signature

## 2022-12-29 MED FILL — REPATHA SURECLICK 140 MG/ML SUBCUTANEOUS PEN INJECTOR: SUBCUTANEOUS | 28 days supply | Qty: 2 | Fill #4

## 2023-01-01 NOTE — Unmapped (Signed)
Patient AWV scheduled for Phone AWV 01/04/2023 @2pm  with Karen Chafe    Abstraction Result Flowsheet Data    This patient's last AWV date: [facility] Last Medicare Wellness Visit Date: 10/26/2018  This patients last WCC/CPE date: : 09/30/2021    Reason for Encounter  Reason for Encounter: Outreach  Primary Reason for Outreach: AWV  Text Message: No  MyChart Message: No  Outreach Call Outcome: Scheduled Telephone

## 2023-01-04 ENCOUNTER — Institutional Professional Consult (permissible substitution): Admit: 2023-01-04 | Discharge: 2023-01-05 | Payer: MEDICARE

## 2023-01-04 DIAGNOSIS — Z0000 Encounter for general adult medical examination without abnormal findings: Principal | ICD-10-CM

## 2023-01-04 NOTE — Unmapped (Signed)
Patient Education        Well Visit, Over 53: Care Instructions  Well visits can help you stay healthy. Your doctor has checked your overall health and may have suggested ways to take good care of yourself. Your doctor also may have recommended tests. You can help prevent illness with healthy eating, good sleep, vaccinations, regular exercise, and other steps.    Get the tests that you and your doctor decide on. Depending on your age and risks, examples might include hearing tests as well as screening for colon, breast, and lung cancer. Screening helps find diseases before any symptoms appear.   Eat healthy foods. Choose fruits, vegetables, whole grains, lean protein, and low-fat dairy foods. Limit saturated fat, and reduce salt.     Limit alcohol. Men should have no more than 2 drinks a day. Women should have no more than 1. For some people, no alcohol is the best choice.   Exercise. It can help prevent falls. Get at least 30 minutes of exercise on most days of the week. Walking, yoga, and tai chi can be good choices.     Reach and stay at your healthy weight. This will lower your risk for many health problems.   Take care of your mental health. Try to stay connected with friends, family, and community, and find ways to manage stress.     If you're feeling depressed or hopeless, talk to someone. A counselor can help. If you don't have a counselor, talk to your doctor.   Talk to your doctor if you think you may have a problem with alcohol or drug use. This includes prescription medicines and illegal drugs.     Avoid tobacco and nicotine: Don't smoke, vape, or chew. If you need help quitting, talk to your doctor.   Practice safer sex. Getting tested, using condoms or dental dams, and limiting sex partners can help prevent STIs.     Make an advance directive. This is a legal way to tell your family and doctor what you want to happen at the end of your life or when you can't speak for yourself.   Prevent problems where you can. Protect your skin from too much sun, wash your hands, brush your teeth twice a day, and wear a seat belt in the car.   Where can you learn more?  Go to [facility] at https://my[facility]chart.Armed forces logistics/support/administrative officer in the Menu. Enter [phone] in the search box to learn more about Well Visit, Over 65: Care Instructions.  Current as of: May 03, 2022               Content Version: 14.0  ?? 2006-2024 [employer].   Care instructions adapted under license by [facility]. If you have questions about a medical condition or this instruction, always ask your healthcare professional. [employer], Inc[employer] disclaims any warranty or liability for your use of this information.       Here is your personalized prevention plan based on your Annual Wellness Visit today.    Medicare Screening & Prevention Guidelines Recommendations Dates Completed HM Status and Next Due Follow-Up   Colorectal Cancer Screening Patients 50 to 75: stool cards annually OR colonoscopy every 10 years (or more frequently if high risk) OR FIT-DNA every 3 years.  Colonoscopy date: 04/16/2012  FOBT/FIT date: Not Found  Sigmoidoscopy date: Not Found  FIT-DNA date: Not Found Health Maintenance Summary     This patient has no relevant Health Maintenance data.  Not within age range   DEXA Bone Density Measurement Patients age 51-95 to have a Dexa every 5 years in postmenopausal women and high risk males. Males will defer to PCP DEXA date: 11/04/2016 Health Maintenance Summary    -      Overdue - DEXA Scan (Every 5 Years) Overdue since 11/04/2021    11/04/2016  Dexa Bone Density Skeletal    11/03/2016  Dexa Bone Density Skeletal             Due. Patient and Provider aware   Diabetes Eye Exam Annually if Diabetic. Eye Exam date: 02/06/2021 Health Maintenance Summary    -      Overdue - Retinal Eye Exam (Yearly) Overdue since 02/06/2022    02/06/2021  HM Diabetic Eye Exam component of HM DIABETES EYE EXAM    12/14/2019  HM Diabetic Eye Exam component of HM DIABETES EYE EXAM    10/19/2018  HM Diabetic Eye Exam component of HM DIABETES EYE EXAM    11/15/2017  OCT, Retina - OU - Both Eyes    01/20/2017  SmartData: OPHTH FUNDUS OD PERIPHERY    Only the first 5 history entries have been loaded, but more history   exists.           Due. Patient and Provider aware   Diabetes Foot Exam Annually if Diabetic. Most Recent Foot Exam Date: 09/30/2021 Health Maintenance Summary    -      Overdue - Foot Exam (Yearly) Overdue since 09/30/2022    09/30/2021  HM DIABETES FOOT EXAM    04/02/2021  HM DIABETES FOOT EXAM    02/20/2020  HM DIABETES FOOT EXAM    07/04/2018  HM DIABETES FOOT EXAM    10/25/2017  HM DIABETES FOOT EXAM    Only the first 5 history entries have been loaded, but more history   exists.           Due. Patient and Provider aware   Diabetes Urine Albumin/Creatinine Ratio Annually if Diabetic UACR Date: 09/24/2021   Health Maintenance Summary    -      Urine Albumin/Creatinine Ratio (Yearly) Next due on 10/22/2023    10/21/2022  Registry Metric: [facility] DM LAST ALBQTUR    09/24/2021  Albumin/Creatinine Ratio, Urine component of   Albumin/creatinine urine ratio    06/10/2020  Albumin/Creatinine Ratio, Urine component of   Albumin/creatinine urine ratio    10/17/2019  Albumin/Creatinine Ratio, Urine component of   Albumin/creatinine urine ratio    10/20/2018  Albumin/Creatinine Ratio, Urine component of   Albumin/creatinine urine ratio    Only the first 5 history entries have been loaded, but more history   exists.             Up to date   Diabetes Hemoglobin A1c Every 3 or 6 months depending on last result Last Hemoglobin A1c Date: 10/21/2022 Health Maintenance Summary    -      Hemoglobin A1c (Every 6 Months) Next due on 04/21/2023    10/21/2022  Hemoglobin A1c component of Hemoglobin A1c    03/16/2022  Hemoglobin A1c component of Hemoglobin A1c    09/24/2021  Hemoglobin A1c component of Hemoglobin A1c    04/02/2021  Hemoglobin A1c component of Hemoglobin A1c    09/18/2020  Hemoglobin A1c component of Hemoglobin A1c    Only the first 5 history entries have been loaded, but more history   exists.           Up to  date   Heart Disease Screening (fasting lipid panel) Minimum of every 5 years, patients age 28-75,  if no apparent signs or symptoms of heart disease. LDL date: 03/16/2022  Total choleseterol date: 03/16/2022  HDL date: 03/16/2022  Triglycerides date: 03/16/2022 Health Maintenance Summary     This patient has no relevant Health Maintenance data.       Up to date   Mammogram Screening Age 56-74 every 2 years.  Mammogram date: 10/28/2017 Health Maintenance Summary     This patient has no relevant Health Maintenance data.     Not within age range   Pelvic Exam & Pap Smear Women ages 44 to 28 every 3 years with negative cytology (pap smear)  OR,   women ages 52 to 32 every 5 years if they have had both a negative pap and human papillomavirus (HPV) OR,  Every 3 years if they had a positive HPV result Pap Smear date: 05/28/2022  HPV date: 05/14/2022 Health Maintenance Summary     This patient has no relevant Health Maintenance data.     Not within age range   Hepatitis C Screening A one-time screening for HCV infection for adults age 51 to 81 years old.  HCV screening date: 10/26/2019 Health Maintenance Summary     This patient has no relevant Health Maintenance data.       Complete   Covid-19 Vaccine For persons 5 and older @COVIDVACCINEDATES3X @ Health Maintenance   Topic Date Due    COVID-19 Vaccine (7 - 2023-24 season) 10/23/2022      Provided information on how to obtain at pharmacy and due. Patient and Provider aware   Tdap Every 10 years (will not be covered by Medicare) DTap/Tdap/TD vaccination: 12/13/2017 Health Maintenance Summary    -      DTaP/Tdap/Td Vaccines (2 - Td or Tdap) Next due on 12/14/2027    12/13/2017  Imm Admin: TdaP           Up to date   Influenza Vaccine Annually  Influenza Vaccination: 08/28/2022   Health Maintenance Summary    - Influenza Vaccine (Series Information) Completed    08/28/2022  Imm Admin: INFLUENZA QUAD ADJUVANTED 46YR UP(FLUAD)    07/25/2021  Imm Admin: INFLUENZA QUAD HIGH DOSE 46YRS+(FLUZONE)    06/20/2020  Imm Admin: INFLUENZA QUAD HIGH DOSE 46YRS+(FLUZONE)    06/21/2019  Imm Admin: INFLUENZA QUAD HIGH DOSE 46YRS+(FLUZONE)    09/02/2018  Imm Admin: INFLUENZA QUAD HIGH DOSE 46YRS+(FLUZONE)    Only the first 5 history entries have been loaded, but more history   exists.             Complete   Pneumococcal Vaccine  For people ?65, or with an underlying condition Pneumonia vaccination: 09/15/2017   Health Maintenance Summary    -      Pneumococcal Vaccine 65+ (Series Information) Completed    09/15/2017  Imm Admin: PNEUMOCOCCAL POLYSACCHARIDE 23-VALENT    01/06/2017  Imm Admin: Pneumococcal Conjugate 13-Valent             Complete   Zoster Vaccine Healthy adults 50 years and older receive 2 doses of recombinant zoster vaccine two to six months apart (may not be covered by Medicare).  Zoster vaccination: 12/07/2017 Health Maintenance Summary    -      Zoster Vaccines (Series Information) Completed    12/07/2017  Imm Admin: SHINGRIX-ZOSTER VACCINE   (HZV),RECOMBINANT,ADJUVANTED(IM)    09/07/2017  Imm Admin: SHINGRIX-ZOSTER VACCINE   (HZV),RECOMBINANT,ADJUVANTED(IM)  07/07/2017  Imm Admin: SHINGRIX-ZOSTER VACCINE   (HZV),RECOMBINANT,ADJUVANTED(IM)           Complete

## 2023-01-04 NOTE — Unmapped (Signed)
Advance Care Planning Note     Discussion Date: 01/04/23   Discussion Participants: patient    The patient wishes to discuss Advance Care Planning today and the following is a brief summary of our discussion.     Patient has capacity to make their own medical decisions: Yes  Health Care Agent/Surrogate Decision Maker documented in chart: Yes    Documents on file and valid:  Advance Directive/Living Will: Yes   Health Care Power of Attorney: Yes    Communication of Medical Status/Prognosis:   Patient and CM      Communication of Treatment Goals/Options:   HCDM and ACP documentation in chart and reviewed. Patient states desire to be full code.      Treatment Decisions  01/04/2023    Powell-Tillman, Valetta Close, MD was present and immediately available in office suite.    The patient reports they have brought a copy of their [place] Practical Advance Form to the office, verified this has been scanned to the chart. .      Case Manager facilitated discussion with patient and about advance care planning and documentation including [place] Practical Advance Form . The patient voluntarily agreed to bring to office [place] Practical Advance Form.   Note: forms that require notarization require two witnessess that are non-family members nor health care workers.         Time Statement: Total phone time spent on advance care planning was 5 minutes with 8 minutes spent in counseling, including the explanation.    Carollee Sires, RN  01/04/2023 2:38 PMThis auto-generated note displays some results identified during the AWV Assessments. For full results, please see the Flowsheet Links under the Additional Documentation section of this encounter in Chart Review.        The patient reports they are physically located in [place] and is currently: at home. I conducted a phone visit.  I spent 35 minutes on the phone call with the patient on the date of service .       Location of Patient: [facility]    Patient was unable to report the following vital signs today due to visit being performed by telehealth and patient lack of required equipment to obtain: Pulse and Temperature. Please see flowsheets for any vital signs that were reported this visit.      Patient reported Vital Signs: Height, weight, blood pressure    Inform/Regulatory Low priority, no response needed unless change in care.     I am reaching out to [name] as part of the embedded care management team regarding her Annual Medicare Wellness visit.  Patient is at risk for Concerning for memory/cognitive impairment.    Patient advised to Review AVS for education related to risk.Marland Kitchen      Next appt with PCP: none. Scheduling notified       Current Outpatient Medications:     amLODIPine (NORVASC) 10 MG tablet, Take 1 tablet (10 mg total) by mouth daily., Disp: 90 tablet, Rfl: 3    clopidogreL (PLAVIX) 75 mg tablet, Take 1 tablet (75 mg total) by mouth daily., Disp: 90 tablet, Rfl: 3    evolocumab (REPATHA SURECLICK) 140 mg/mL PnIj, Inject the contents of 1 pen (140 mg) under the skin every fourteen (14) days., Disp: 6 mL, Rfl: 3    spironolactone (ALDACTONE) 50 MG tablet, Take 1 tablet (50 mg total) by mouth daily. (Patient taking differently: Take 1 tablet (50 mg total) by mouth daily. Taking 1/2 tab daily), Disp:  90 tablet, Rfl: 3    venlafaxine (EFFEXOR-XR) 37.5 MG 24 hr capsule, TAKE 1 CAPSULE (37.5 MG TOTAL) BY MOUTH DAILY., Disp: 90 capsule, Rfl: 1    empty container (SHARPS-A-GATOR DISPOSAL SYSTEM) Misc, Use as directed for sharps disposal, Disp: 1 each, Rfl: 2    pantoprazole (PROTONIX) 40 MG tablet, Take 1 tablet (40 mg total) by mouth daily. (Patient not taking: Reported on 01/04/2023), Disp: 90 tablet, Rfl: 3    Sharing communication as part of regulatory requirements.      Social Determinants of Health:  Social Determinants of Health Screened today.  Interventions Provided: I provided an intervention for the Social Connections SDOH domain. The intervention was Counseling    PCP notified of above risks by  Routing encounter    The following list of current providers and suppliers reviewed and updated this visit.  Patient Care Team:  Powell-Tillman, Valetta Close, MD as PCP - General (Internal Medicine)  Powell-Tillman, Valetta Close, MD as PCP - Tharon Aquas, Lonn Georgia, MD as Consulting Physician (Cardiovascular Disease)    Medications and supplements were reviewed and updated this visit. See medication list in encounter summary.     A personalized prevent plan was updated and reviewed with the patient. A copy has been provided to the patient in Patient Instructions.    Recent Hospitalizations reviewed:  No recent hospitalizations     General Health:     Patient's BMI is 24.75   Pain identified during today's visit        01/04/23 1401   PainSc: 0-No pain         Safety:   Patient answered ADL/IADL's:  Dressing: Independent    Grooming: Independent   Feeding: Independent    Bathing: Independent    Toileting: Independent    Advertising account executive      Continence Continent      Psychosocial Assessment:   Patient expressed that they or a family member has concerns about their memory  Inform PCP    PHQ 2:  Patient had a PHQ 2 score of 0    PHQ 9: N/A    No intervention necessary (RN)PHQ-9:    Patient had a None-Minimal PHQ-9 score of 0  Inform PCP        Social Determinants of Health     Financial Resource Strain: Low Risk  (01/04/2023)    Overall Financial Resource Strain (CARDIA)     Difficulty of Paying Living Expenses: Not hard at all   Internet Connectivity: No Internet connectivity concern identified (01/04/2023)    Internet Connectivity     Do you have access to internet services: Yes     How do you connect to the internet: Personal Device at home     Is your internet connection strong enough for you to watch video on your device without major problems?: Yes     Do you have enough data to get through the month?: Yes     Does at least one of the devices have a camera that you can use for video chat?: Yes   Food Insecurity: No Food Insecurity (01/04/2023)    Hunger Vital Sign     Worried About Running Out of Food in the Last Year: Never true     Ran Out of Food in the Last Year: Never true   Tobacco Use: Medium Risk (01/04/2023)    Patient History     Smoking Tobacco Use: Former     Smokeless Tobacco Use:  Never     Passive Exposure: Past   Housing/Utilities: Low Risk  (01/04/2023)    Housing/Utilities     Within the past 12 months, have you ever stayed: outside, in a car, in a tent, in an overnight shelter, or temporarily in someone else's home (i.e. couch-surfing)?: No     Are you worried about losing your housing?: No     Within the past 12 months, have you been unable to get utilities (heat, electricity) when it was really needed?: No   Alcohol Use: Not At Risk (01/04/2023)    Alcohol Use     How often do you have a drink containing alcohol?: Monthly or less     How many drinks containing alcohol do you have on a typical day when you are drinking?: 1 - 2     How often do you have 5 or more drinks on one occasion?: Never   Transportation Needs: No Transportation Needs (01/04/2023)    PRAPARE - Transportation     Lack of Transportation (Medical): No     Lack of Transportation (Non-Medical): No   Substance Use: Low Risk  (01/04/2023)    Substance Use     Taken prescription drugs for non-medical reasons: Never     Taken illegal drugs: Never     Patient indicated they have taken drugs in the past year for non-medical reasons: No   Health Literacy: Low Risk  (01/04/2023)    Health Literacy     : Never   Physical Activity: Sufficiently Active (01/04/2023)    Exercise Vital Sign     Days of Exercise per Week: 7 days     Minutes of Exercise per Session: 30 min   Interpersonal Safety: Not at risk (01/04/2023)    Interpersonal Safety     Unsafe Where You Currently Live: No     Physically Hurt by Anyone: No     Abused by Anyone: No   Stress: No Stress Concern Present (01/04/2023) [employer] of Occupational Health - Occupational Stress Questionnaire     Feeling of Stress : Only a little   Intimate Partner Violence: Not At Risk (01/04/2023)    Humiliation, Afraid, Rape, and Kick questionnaire     Fear of Current or Ex-Partner: No     Emotionally Abused: No     Physically Abused: No     Sexually Abused: No   Depression: Not at risk (02/16/2022)    PHQ-2     PHQ-2 Score: 2   Social Connections: Moderately Isolated (01/04/2023)    Social Connection and Isolation Panel [NHANES]     Frequency of Communication with Friends and Family: More than three times a week     Frequency of Social Gatherings with Friends and Family: More than three times a week     Attends Religious Services: Never     Database administrator or Organizations: Yes     Attends Engineer, structural: More than 4 times per year     Marital Status: Divorced

## 2023-01-06 NOTE — Unmapped (Signed)
The patient has a phone mwv with Karen Chafe on 01/04/23, as scheduled by an outside source. She needs to schedule her annual physical sometime this month (April) to go along with her mwv. Please schedule.

## 2023-01-11 ENCOUNTER — Ambulatory Visit: Admit: 2023-01-11 | Discharge: 2023-01-12 | Payer: MEDICARE

## 2023-01-11 DIAGNOSIS — Z0000 Encounter for general adult medical examination without abnormal findings: Principal | ICD-10-CM

## 2023-01-11 LAB — BASIC METABOLIC PANEL
ANION GAP: 10 mmol/L
BLOOD UREA NITROGEN: 16 mg/dL (ref 5–26)
BUN / CREAT RATIO: 15 (ref 12–25)
CALCIUM: 8.9 mg/dL (ref 8.5–10.5)
CHLORIDE: 105 mmol/L (ref 95–110)
CO2: 27.3 mmol/L (ref 21.0–31.0)
CREATININE: 1.06 mg/dL (ref 0.50–1.50)
EGFR CKD-EPI (2021) FEMALE: 53 mL/min/{1.73_m2} — ABNORMAL LOW (ref >=60)
GLUCOSE RANDOM: 99 mg/dL (ref 60–100)
POTASSIUM: 4.5 mmol/L (ref 3.5–5.1)
SODIUM: 142 mmol/L (ref 136–145)

## 2023-01-11 LAB — CBC W/ AUTO DIFF
HEMATOCRIT: 39.7 % (ref 35.0–45.0)
HEMOGLOBIN: 13.3 g/dL (ref 11.3–15.5)
LYMPHOCYTES ABSOLUTE COUNT: 2 10*9/L (ref 1.0–4.8)
LYMPHOCYTES RELATIVE PERCENT: 33.1 %
MEAN CORPUSCULAR HEMOGLOBIN CONC: 33.6 g/dL (ref 31.0–36.0)
MEAN CORPUSCULAR HEMOGLOBIN: 25.4 pg — ABNORMAL LOW (ref 26.0–32.0)
MEAN CORPUSCULAR VOLUME: 76 fL — ABNORMAL LOW (ref 89.0–97.0)
MEAN PLATELET VOLUME: 6.3 fL — ABNORMAL LOW (ref 6.5–8.9)
MONOCYTES ABSOLUTE COUNT: 0.4 10*9/L (ref 0.0–0.8)
MONOCYTES RELATIVE PERCENT: 6.7 %
NEUTROPHILS ABSOLUTE COUNT: 3.7 10*9/L (ref 1.8–7.0)
NEUTROPHILS RELATIVE PERCENT: 60.2 %
PLATELET COUNT: 340 10*9/L (ref 140–440)
RED BLOOD CELL COUNT: 5.24 10*12/L — ABNORMAL HIGH (ref 3.80–5.10)
RED CELL DISTRIBUTION WIDTH: 15.9 % — ABNORMAL HIGH (ref 11.0–15.0)
WBC ADJUSTED: 6.1 10*9/L (ref 3.8–10.8)

## 2023-01-11 LAB — LIPID PANEL
CHOLESTEROL/HDL RATIO SCREEN: 2.7 (ref 0.0–4.5)
CHOLESTEROL: 168 mg/dL (ref 0–200)
HDL CHOLESTEROL: 63 mg/dL — ABNORMAL HIGH (ref 40–60)
LDL CHOLESTEROL CALCULATED: 82 mg/dL (ref 30–130)
NON-HDL CHOLESTEROL: 105 mg/dL
TRIGLYCERIDES: 115 mg/dL (ref <=150)
VLDL CHOLESTEROL CAL: 23 mg/dL — ABNORMAL LOW (ref 25–40)

## 2023-01-11 LAB — ALT: ALT (SGPT): 15 U/L (ref 12–78)

## 2023-01-11 LAB — AST: AST (SGOT): 24 U/L (ref 7–31)

## 2023-01-18 ENCOUNTER — Ambulatory Visit: Admit: 2023-01-18 | Discharge: 2023-01-18 | Payer: MEDICARE | Attending: Internal Medicine | Primary: Internal Medicine

## 2023-01-18 ENCOUNTER — Ambulatory Visit: Admit: 2023-01-18 | Discharge: 2023-01-18 | Payer: MEDICARE

## 2023-01-18 DIAGNOSIS — R232 Flushing: Principal | ICD-10-CM

## 2023-01-18 DIAGNOSIS — Z0000 Encounter for general adult medical examination without abnormal findings: Principal | ICD-10-CM

## 2023-01-18 DIAGNOSIS — R1013 Epigastric pain: Principal | ICD-10-CM

## 2023-01-18 DIAGNOSIS — R194 Change in bowel habit: Principal | ICD-10-CM

## 2023-01-18 DIAGNOSIS — K219 Gastro-esophageal reflux disease without esophagitis: Principal | ICD-10-CM

## 2023-01-18 MED ORDER — VENLAFAXINE ER 75 MG CAPSULE,EXTENDED RELEASE 24 HR
ORAL_CAPSULE | Freq: Every day | ORAL | 1 refills | 90.00000 days | Status: CP
Start: 2023-01-18 — End: 2023-07-17

## 2023-01-18 NOTE — Unmapped (Addendum)
Outpatient Gynecology Note: Return Visit    ASSESSMENT AND PLAN     Shelton Furniss is a 82 y.o. [id number] here for follow up of EMB results and discussion of hot flashes    - EMB results reviewed and endorsed understanding. Given previously established plan and that she has not had further bleeding, will defer [facility] D&C. Will let us know if she redevelops bleeding.  - Discussed the general timeline of hot flashes in the setting of menopause and that it would be unexpected to have recurrence of hot flashes after a near two decade absence. Discussed that these can be due to other etiologies such as thyroid disorders, TB (which her PCP had appropriately worked up and were negative) - as well as more rare disorders such as pheochromocytomas (for example). Otherwise no B symptoms. Would encourage that her PCP continue seeking other potential answers in event that these are not estrogen deficiency-related.  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 Has been asymptomatic. Offered pelvic exam today to monitor, but she deferred to follow up visit.    Return to care in 4 mo for follow up of her symptoms.      Dr. Danella Maiers was available for the care of this patient.    HISTORY     CC: follow up     HPI:  Ernestine Fallin presents for discussion of EMB results. Also discussion of hot flashes.    EMB performed 04/2022 for thickened EMS, post-TVUS vaginal spotting/bleeding that was originally obtained for left sided pelvic pain (TVUS unremarkable other than 8mm stripe and prominent pelvic venous varices). EMB demonstrated:    A: Endometrium, biopsy  - Predominantly scant superficial strips and small fragments of inactive endometrial tissue   - Fragment of endometrial tissue with fibrotic stroma, possibly suggestive of endometrial polyp  - No hyperplasia or malignancy identified in this sample    I see telephone note about plan for expectant management and for [facility] D&C in event that she still has bleeding. She reports that she has not had bleeding since her ultrasound.    Menopausal in her 92s - reports hot flashes for about 10 years after then stopped. 5-6 mo ago, restarted - parodoxical, wakes up in the middle of the night from them and is sweaty. Generalized throughout her body. Episodes last less than 5 minutes. Says that emotions can be a trigger. Thinks that these are more intense than her hot flashes when she was younger.    Denies weight loss, fever.    Her PCP had prescribed venlafaxine 37.5 to help with this, reports that it helps mildly - started a few weeks ago.       Review of Systems  A 12 system review of systems was negative except as noted in HPI    The following portions of the patient's history were reviewed and updated as appropriate: allergies, current medications, past obstetric history, family history, past medical history, past social history, past surgical history and problem list.    PHYSICAL EXAM   BP 169/80  - Pulse 80  - Wt 71.7 kg (158 lb)  - BMI 26.32 kg/m??   Body mass index is 26.32 kg/m??.     Constitutional: No distress.   Cardiovascular:  Normal rate.    Pulmonary/Chest: Normal work of breathing.   Genitourinary: Deferred per patient request  Neurological: She is alert and oriented to person, place, and time.   Neuro: Alert and conversational  Psychiatric: Normal mood and affect    LABS AND IMAGING     Lab Results   Component Value Date    WBC 6.1 01/11/2023    HGB 13.3 01/11/2023    HCT 39.7 01/11/2023    PLT 340 01/11/2023     Lab Results   Component Value Date    CREATININE 1.06 01/11/2023         MRI abdomen with and without contrast    Result Date: 10/06/2022  Exam:  MRI of the Kidneys without and with Contrast  History:  Renal lesions.  Technique: Breath-hold multiplanar assessment of the kidneys was undertaken. T1, T2 gradient echo, and steady-state images were created, with fat suppression per standardized protocol to assess tissue characteristics. Multi-oblique unilateral or select plane bilateral renal perfusion was then undertaken, creating cortical, nephrographic, and delayed urographic images after the uneventful administration contrast. Subtraction images were created as needed to clarify enhancement characteristics that might increase specificity of diagnosis. Delayed contrast MRU  (urograms) were created if felt diagnostically useful. IV contrast: 8 mL of Gadavist.   Comparison:  09/23/2022 and priors.  Findings: LOWER THORAX:  Lungs grossly clear. No pleural effusion.  LIVER:  Normal size. Smooth contour. No significant steatosis. Cyst in segment VI (series 6, image 13) measuring 1.6 cm. Patent hepatic vasculature.  GALLBLADDER: No stones or sludge. Non-distended. No wall thickening, hyperenhancement, or pericholecystic fat stranding.  BILE DUCTS: No intrahepatic biliary ductal dilation or irregularity. Common bile duct normal in caliber.  PANCREAS:  Normal background parenchyma. Main pancreatic duct normal in caliber.  SPLEEN:  Normal size.  ADRENALS:  Normal morphology.  KIDNEYS/URETERS:  - No hydroureteronephrosis. - Right inferior pole lesion (series 4, image 80) measuring 1.5 x 1.5 x 1.2 cm, unchanged from 01/23/2022. Lesion is circumscribed, T2 hyperintense, T1 hypointense, with single thin non-enhancing septation, without reduced diffusion. Bosniak II. - Left posterior interpolar lesion (image 10) measuring 2.6 x 2.4 x 1.9 cm, unchanged. Lesion is circumscribed, T2 hyperintense, T1 hypointense, with few thin non-enhancing septations, without reduced diffusion. Bosniak II. - Bilateral subcentimeter simple cysts. Bosniak I.  VASCULAR:  Normal caliber. Severe atherosclerosis of the abdominal aorta and its branches. Left renal artery stent. Stent patency not well evaluated due to susceptibility artifact.  LYMPH NODES:  No lymphadenopathy.  BOWEL: Small hiatal hernia. No evidence of enterocolitis. No obstruction. Moderate colonic stool burden. Colonic diverticulosis.  PERITONEUM: No ascites.  BONES:  No aggressive lesion. No acute abnormality.  SOFT TISSUES: Normal.      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 No further follow-up necessary.  Signed (Electronic Signature): 10/06/2022 3:50 PM Signed By: Trinidad Curet, MD

## 2023-01-18 NOTE — Unmapped (Signed)
Assessment and Plan    1. Routine general medical examination at a health care facility      Stable PE except for hypertension.  Lifestyle issues discussed ( including diet/sleep hygiene/exercise/stress management/sexual health/substance use as appropriate).  Lab results were reviewed in detail with the patient. A  hard copy was offered to the patient.   Detailed vaccine counseling re:  COVID-19 booster.   HM in diabetes mellitus was reviewed.  Aged out of routine cancer screenings.  BMD 2018.  Eye exam is current.  Follow up in six months or sooner prn.    Defers antihypertensive(s) changes.  Follow up as scheduled with appropriate provider or sooner prn.     Diabetes is well controlled.  No change in medical therapy.  Follow up as scheduled with appropriate provider or sooner prn.    No problem-specific Assessment & Plan notes found for this encounter.    Orders Placed This Encounter   Procedures    HM DIABETES FOOT EXAM     Requested Prescriptions      No prescriptions requested or ordered in this encounter       Indications, pharmacology, and potential adverse effects (to include significant interactions) were reviewed with the patient.      There are no discontinued medications.    Return in about 6 months (around 07/20/2023) for Recheck.    Signs and symptoms that should prompt return sooner than scheduled were reviewed with the patient.      PCMH Components:     Barriers to goals identified and addressed. Pertinent handouts were given today and reviewed with the patient as indicated.  The Care Plan and Self-Management goals have been included on the AVS and the AVS has been printed. Any outside resources or referrals needed at this time are noted above. Patient's current medications have been reviewed. Any new medications prescribed have been discussed, and side effects have been addressed. Have assessed the patient's understanding, response, and barriers to adherence to medications. Patient voiced understanding and all questions have been answered to satisfaction.         HPI    Chief Complaint    Chief Complaint   Patient presents with    Annual Exam       Kayla Knight presents for comprehensive wellness visit  and evaluation and management of the following concern(s):    None acute.  Using natural remedies to supplement antihypertensive(s) and gastro-esophageal reflux symptoms (fenugreek, garlic).  Talking spironolactone 25 mg daily.   Exercises regularly.        I have reviewed the patient's medical history in detail and updated the computerized patient record.    Patient Active Problem List   Diagnosis    Essential hypertension, benign    Primary osteoarthritis of right knee    Idiopathic peripheral neuropathy    MR (mitral regurgitation)    Neck pain    Encounter for subsequent annual wellness visit (AWV) in Medicare patient    Lipoma of torso    Skin lesion    Hearing loss    Lichen sclerosus et atrophicus of the vulva    Renal artery stenosis, native, bilateral (CMS-HCC)    Hyperlipidemia    Aftercare following joint replacement    Presence of left artificial knee joint    Hypokalemia    Decreased GFR    Nonrheumatic aortic valve insufficiency    Endometrial thickening on ultrasound    Weakness of both hips    Right hip pain  Hot flashes         Allergies:  Penicillins, Lisinopril, Statins-hmg-coa reductase inhibitors, and Sulfa (sulfonamide antibiotics)    Medications:   Outpatient Medications Prior to Visit   Medication Sig Dispense Refill    amLODIPine (NORVASC) 10 MG tablet Take 1 tablet (10 mg total) by mouth daily. 90 tablet 3    clopidogreL (PLAVIX) 75 mg tablet Take 1 tablet (75 mg total) by mouth daily. 90 tablet 3    empty container (SHARPS-A-GATOR DISPOSAL SYSTEM) Misc Use as directed for sharps disposal 1 each 2    evolocumab (REPATHA SURECLICK) 140 mg/mL PnIj Inject the contents of 1 pen (140 mg) under the skin every fourteen (14) days. 6 mL 3    spironolactone (ALDACTONE) 50 MG tablet Take 1 tablet (50 mg total) by mouth daily. (Patient taking differently: Take 1 tablet (50 mg total) by mouth daily. Taking 1/2 tab daily) 90 tablet 3    venlafaxine (EFFEXOR-XR) 37.5 MG 24 hr capsule TAKE 1 CAPSULE (37.5 MG TOTAL) BY MOUTH DAILY. 90 capsule 1    pantoprazole (PROTONIX) 40 MG tablet Take 1 tablet (40 mg total) by mouth daily. 90 tablet 3     No facility-administered medications prior to visit.       Medical History:  Past Medical History:   Diagnosis Date    Anemia     past history, over 40 yrs ago    Arthritis     hand, knees, joints    Asthma     past history, no recent attacks    Asthma     patient stated due to asthmatic medication    Cataract 2008    Surgery    Diabetes mellitus (CMS-HCC)     Type II, on 9/15 patient stated she was not longer diabetic, not taking meds    Dry eye     Hearing impairment     Heart murmur 2016    [facility]    Heart valve disease     Heart mumur, pt reports diagnosis 10 yrs ago (05/10/20)    History of stent insertion of renal artery 01/29/2020    ASA indefinitely and plavix x 6 months per procedure note    Hypertension     pt reports taking meds since about 1995    Neuromuscular disorder (CMS-HCC) 2014    Feet & Legs    Tear of meniscus of knee     4.5 yrs ago    Urinary incontinence     seen a doctor about nighttime bathroom use (adressed in 2019)       Surgical History:  Past Surgical History:   Procedure Laterality Date    CATARACT EXTRACTION W/ INTRAOCULAR LENS IMPLANT Left 02/27/2016    EYE SURGERY  01/30/16    Cataract surgery is scheduled.    JOINT REPLACEMENT  Knee    KNEE CARTILAGE SURGERY Right 2007, 2008    x 2    KNEE SURGERY      pt reports total left knee joint replacement surgery around 2016, 2017    PLANTAR FASCIECTOMY Left     PR REVASCULARIZE FEM/POP ARTERY,ANGIOPLASTY/STENT N/A 01/29/2020    Procedure: Peripheral Angiography W Intervetion;  Surgeon: Alvira Philips, MD;  Location[facility] CATH;  Service: Cardiology    PR REVASCULARIZE FEM/POP ARTERY,ANGIOPLASTY/STENT Right 12/26/2021    Procedure: right renal artery intervention;  Surgeon: Alvira Philips, MD;  Location: [facility] CATH;  Service: Cardiology    PR  UPPER GI ENDOSCOPY,BIOPSY N/A 04/01/2021    Procedure: UGI ENDOSCOPY; WITH BIOPSY, SINGLE OR MULTIPLE;  Surgeon: Jarvis Morgan, MD;  Location: [facility]  Service: Gastroenterology    PR XCAPSL CTRC RMVL INSJ IO LENS PROSTH W/O ECP Right 02/13/2016    Procedure: EXTRACAPSULAR CATARACT REMOVAL W/INSERTION OF INTRAOCULAR LENS PROSTHESIS, MANUAL OR MECHANICAL TECHNIQUE;  Surgeon: Garey Ham, MD;  Location: [facility] OR [facility];  Service: Ophthalmology    PR XCAPSL CTRC RMVL INSJ IO LENS PROSTH W/O ECP Left 02/27/2016    Procedure: EXTRACAPSULAR CATARACT REMOVAL W/INSERTION OF INTRAOCULAR LENS PROSTHESIS, MANUAL OR MECHANICAL TECHNIQUE;  Surgeon: Garey Ham, MD;  Location: [facility] OR [facility];  Service: Ophthalmology    RENAL ARTERY STENT  01/11/2020    ROOT CANAL      TUBAL LIGATION  1973    [facility]    WISDOM TOOTH EXTRACTION         Social History:  Tobacco use:   reports that she quit smoking about 38 years ago. Her smoking use included cigarettes. She has been exposed to tobacco smoke. She has never used smokeless tobacco.  Alcohol use:   reports that she does not currently use alcohol.  Drug use:  reports no history of drug use.      Family History:  Family History   Problem Relation Age of Onset    Heart disease Mother     Hypertension Mother     Dementia Mother     Arthritis Mother     Heart disease Father     Thyroid disease Father     Glaucoma Father     Alcohol abuse Father     Heart attack Father 66    Arthritis Father     Hypertension Father         Massive heart attack    Arthritis Sister     Glaucoma Sister     Hypertension Sister     Diabetes Sister     Diabetes Daughter     Diabetes Paternal Aunt     No Known Problems Brother     No Known Problems Maternal Aunt     No Known Problems Maternal Uncle     No Known Problems Paternal Uncle     No Known Problems Maternal Grandmother     No Known Problems Maternal Grandfather     No Known Problems Paternal Grandmother     No Known Problems Paternal Grandfather     Arthritis Sister         Noticeable in extremities    COPD Sister     Depression Sister     Hypertension Sister     Glaucoma Sister     Asthma Daughter         Ocassional    Hypertension Daughter     Miscarriages / Stillbirths Daughter     Cancer Maternal Aunt         Died more than 42yrs ago    Early death Maternal Aunt     Diabetes Paternal Aunt         4 Aunts    Kidney disease Paternal Aunt     Amblyopia Neg Hx     Blindness Neg Hx     Cancer Neg Hx     Cataracts Neg Hx     Macular degeneration Neg Hx     Retinal detachment Neg Hx     Strabismus Neg Hx     Stroke  Neg Hx            Review of Systems:    Pertinent review of systems is noted in the HPI.        Physical Exam    BP 152/78 (BP Site: L Arm, BP Position: Sitting, BP Cuff Size: Large)  - Pulse 69  - Temp 37.1 ??C (98.7 ??F) (Oral)  - Ht 165 cm (5' 4.96)  - Wt 70.8 kg (156 lb)  - SpO2 98%  - BMI 25.99 kg/m??     BP Readings from Last 3 Encounters:   01/18/23 169/80   01/18/23 152/78   01/18/23 140/80       Wt Readings from Last 3 Encounters:   01/18/23 71.7 kg (158 lb)   01/18/23 70.8 kg (156 lb)   01/18/23 71.2 kg (157 lb)         General: alert,  is oriented, is not in distress.  HEENT: NCAT.  Normal sclerae. Mouth and throat are normal. Ears are normal. Nose is normal.    NECK: Supple. Enlarged thyroid is not present.  Masses are not present.    RESP: Normal respiratory effort.  Clear to auscultation. No wheezing or rhonchi..  CV: Regular rate and rhythm. Normal S1 and S2.  Extra heart sounds are not present.  Murmur(s) is present 3/6 systolic.   Breasts: breasts appear normal, no suspicious masses, no skin or nipple changes or axillary nodes.   ABD:  Abdomen is soft, with normal  bowel sounds, without masses, without hepatosplenomegaly, without rebound or guarding.  Pelvic exam: examination not indicated.  LYMPH:  no cervical or supraclavicular LAD  MSK: Muscle tenderness is not present. Joint swelling, warmth, or tenderness are absent.  Right total knee arthroplasty  SKIN: Appropriately warm and moist.  Rash is absent.  NEURO: stable gait and normal coordination. Cranial nerves 2-12 are grossly intact (taste was not assessed).  Strength is normal.  DTR's are normal.  EXT:  Edema is not present .  Capillary refill is normal .  Ulcer(s) are absent. Peripheral pulses are palpable.   PSYCH:  she is alert, is oriented to time, person and place. Normal thought content, speech, affect, mood and dress are noted.    Brief Diabetic foot exam:    Monofilament test 8 of 8 sites normal   Visual foot inspection - Skin:  normal; Nails:  too long; Foot deformities:  none

## 2023-01-18 NOTE — Unmapped (Signed)
Assessment and Plan  1. Gastroesophageal reflux disease, unspecified whether esophagitis present -ok to cont current regimen. Ok for ppi as needed   2. Dyspepsia    3. Bowel habit changes        Return if symptoms worsen or fail to improve.  Signs and symptoms that should prompt return sooner than scheduled were reviewed with the patient.  Visit time: In excess of  20  minutes, more than 50% of which was spent face-to-face in counseling and education activities regarding the diagnoses above.     HPI    Chief Complaint  Chief Complaint   Patient presents with    Follow-up     Follow up - no longer taking pantoprazole, discontinued a few months ago - takes a slice of garlic which works well for her      Kayla Knight is here for f/u gerd,dyspepsia. Stopped pantoprazole 2 mos ago. Takes fenugreek tea and small slice of garlic as needed for sx,few times per wk. No exacerbation w cessation of ppi. BM 3-4x/d,occurs after meals. Variable formed,less. No urgency.    Allergies:  Penicillins, Lisinopril, Statins-hmg-coa reductase inhibitors, and Sulfa (sulfonamide antibiotics)    Medications:   Outpatient Medications Prior to Visit   Medication Sig Dispense Refill    amLODIPine (NORVASC) 10 MG tablet Take 1 tablet (10 mg total) by mouth daily. 90 tablet 3    clopidogreL (PLAVIX) 75 mg tablet Take 1 tablet (75 mg total) by mouth daily. 90 tablet 3    empty container (SHARPS-A-GATOR DISPOSAL SYSTEM) Misc Use as directed for sharps disposal 1 each 2    evolocumab (REPATHA SURECLICK) 140 mg/mL PnIj Inject the contents of 1 pen (140 mg) under the skin every fourteen (14) days. 6 mL 3    spironolactone (ALDACTONE) 50 MG tablet Take 1 tablet (50 mg total) by mouth daily. (Patient taking differently: Take 1 tablet (50 mg total) by mouth daily. Taking 1/2 tab daily) 90 tablet 3    venlafaxine (EFFEXOR-XR) 37.5 MG 24 hr capsule TAKE 1 CAPSULE (37.5 MG TOTAL) BY MOUTH DAILY. 90 capsule 1    pantoprazole (PROTONIX) 40 MG tablet Take 1 tablet (40 mg total) by mouth daily. (Patient not taking: Reported on 01/04/2023) 90 tablet 3     No facility-administered medications prior to visit.       Medical History:  Past Medical History:   Diagnosis Date    Anemia     past history, over 40 yrs ago    Arthritis     hand, knees, joints    Asthma     past history, no recent attacks    Asthma     patient stated due to asthmatic medication    Cataract 2008    Surgery    Diabetes mellitus (CMS-HCC)     Type II, on 9/15 patient stated she was not longer diabetic, not taking meds    Dry eye     Hearing impairment     Heart murmur 2016    [facility] Verdie [facility]ire @ [facility]    Heart valve disease     Heart mumur, pt reports diagnosis 10 yrs ago (05/10/20)    History of stent insertion of renal artery 01/29/2020    ASA indefinitely and plavix x 6 months per procedure note    Hypertension     pt reports taking meds since about 1995    Neuromuscular disorder (CMS-HCC) 2014    Feet & Legs    Tear  of meniscus of knee     4.5 yrs ago    Urinary incontinence     seen a doctor about nighttime bathroom use (adressed in 2019)       Surgical History:  Past Surgical History:   Procedure Laterality Date    CATARACT EXTRACTION W/ INTRAOCULAR LENS IMPLANT Left 02/27/2016    EYE SURGERY  01/30/16    Cataract surgery is scheduled.    JOINT REPLACEMENT  Knee    KNEE CARTILAGE SURGERY Right 2007, 2008    x 2    KNEE SURGERY      pt reports total left knee joint replacement surgery around 2016, 2017    PLANTAR FASCIECTOMY Left     PR REVASCULARIZE FEM/POP ARTERY,ANGIOPLASTY/STENT N/A 01/29/2020    Procedure: Peripheral Angiography W Intervetion;  Surgeon: Alvira Philips, MD;  Location: [facility] CATH;  Service: Cardiology    PR REVASCULARIZE FEM/POP ARTERY,ANGIOPLASTY/STENT Right 12/26/2021    Procedure: right renal artery intervention;  Surgeon: Alvira Philips, MD;  Location: [facility] CATH;  Service: Cardiology    PR UPPER GI ENDOSCOPY,BIOPSY N/A 04/01/2021 Procedure: UGI ENDOSCOPY; WITH BIOPSY, SINGLE OR MULTIPLE;  Surgeon: Jarvis Morgan, MD;  [facility];  Service: Gastroenterology    PR XCAPSL CTRC RMVL INSJ IO LENS PROSTH W/O ECP Right 02/13/2016    Procedure: EXTRACAPSULAR CATARACT REMOVAL W/INSERTION OF INTRAOCULAR LENS PROSTHESIS, MANUAL OR MECHANICAL TECHNIQUE;  Surgeon: Garey Ham, MD;  [facility]: [facility];  Service: Ophthalmology    PR XCAPSL CTRC RMVL INSJ IO LENS PROSTH W/O ECP Left 02/27/2016    Procedure: EXTRACAPSULAR CATARACT REMOVAL W/INSERTION OF INTRAOCULAR LENS PROSTHESIS, MANUAL OR MECHANICAL TECHNIQUE;  Surgeon: Garey Ham, MD;  Location: [facility] OR [facility];  Service: Ophthalmology    RENAL ARTERY STENT  01/11/2020    ROOT CANAL      TUBAL LIGATION  1973    [facility]    WISDOM TOOTH EXTRACTION         Social History:  Tobacco use:   reports that she quit smoking about 38 years ago. Her smoking use included cigarettes. She has been exposed to tobacco smoke. She has never used smokeless tobacco.  Alcohol use:   reports that she does not currently use alcohol.  Drug use:  reports no history of drug use.    Review of Systems:  ROS:   General ROS: negative for - fever,weight loss  HEENT ROS: negative for sore throat, or rhinitis  Respiratory ROS: no cough, shortness of breath, or wheezing  Cardiovascular ROS: no chest pain or dyspnea on exertion  Gastrointestinal ROS: no abdominal pain, change in bowel habits, or black or bloody stools  Genito-Urinary ROS: no dysuria or hematuria  Musculoskeletal ROS: negative for - new joint pain, muscle pain or muscular weakness   Dermatological ROS: negative for rash or new lesions  Neuro:no headaches      Vitals:    01/18/23 0757   BP: 140/80   BP Site: L Arm   BP Position: Sitting   BP Cuff Size: Small   Pulse: 62   SpO2: 97%   Weight: 71.2 kg (157 lb)       Physical Exam  General: alert, oriented, no distress

## 2023-01-19 NOTE — Unmapped (Signed)
Addended byLajoyce Corners on: 01/19/2023 10:12 AM     Modules accepted: Level of Service

## 2023-01-19 NOTE — Unmapped (Signed)
I have personally seen and evaluated the patient. I have reviewed the history and physical exam as documented by the resident and agree with the assessment and plan as documented by Dr. Binnie Kand

## 2023-01-20 NOTE — Unmapped (Signed)
[facility] Refill Coordination Note    Kayla Knight, [place]: [birth year]  Phone: [phone] (home)       All above HIPAA information was verified with patient.         01/20/2023     2:15 PM   Specialty Rx Medication Refill Questionnaire   Which Medications would you like refilled and shipped? Repatha   Please list all current allergies: Penicillin, Sulfur,   Have you missed any doses in the last 30 days? No   Have you had any changes to your medication(s) since your last refill? No   How many days remaining of each medication do you have at home? 01/24/2023   If receiving an injectable medication, next injection date is 02/09/2023   Have you experienced any side effects in the last 30 days? No   Please enter the full address (street address, city, state, zip code) where you would like your medication(s) to be delivered to. [address] [name], [place] [address]   Please specify on which day you would like your medication(s) to arrive. Note: if you need your medication(s) within 3 days, please call the pharmacy to schedule your order at [phone]  02/05/2023   Has your insurance changed since your last refill? No   Would you like a pharmacist to call you to discuss your medication(s)? No   Do you require a signature for your package? (Note: if we are billing Medicare Part B or your order contains a controlled substance, we will require a signature) No   Additional Comments: Door Code: [phone]         Completed refill call assessment today to schedule patient's medication shipment from the [facility] [phone]).  All relevant notes have been reviewed.       Confirmed patient received a Conservation officer, historic buildings and a Surveyor, mining with first shipment. The patient will receive a drug information handout for each medication shipped and additional FDA Medication Guides as required.         REFERRAL TO PHARMACIST     Referral to the pharmacist: Not needed      [facility] Shipping address confirmed in Epic.     Delivery Scheduled: Yes, Expected medication delivery date: 02/05/2023.     Medication will be delivered via Same Day Courier to the prescription address in Epic WAM.    Kayla Knight   [facility] Specialty Technician

## 2023-02-05 MED FILL — REPATHA SURECLICK 140 MG/ML SUBCUTANEOUS PEN INJECTOR: SUBCUTANEOUS | 28 days supply | Qty: 2 | Fill #5

## 2023-02-11 NOTE — Unmapped (Signed)
Fax received from: [employer] product replacement prescription verification form for Repatha  Given to provider for review and signature

## 2023-02-12 NOTE — Unmapped (Signed)
Fax completed from: [employer] product replacement prescription verification form for Repatha   Faxed to: [phone]  Placed to be scanned to patients chart.

## 2023-02-24 MED ORDER — REPATHA SURECLICK 140 MG/ML SUBCUTANEOUS PEN INJECTOR
SUBCUTANEOUS | 3 refills | 84 days | Status: CP
Start: 2023-02-24 — End: ?
  Filled 2023-03-12: qty 6, 84d supply, fill #0

## 2023-02-24 NOTE — Unmapped (Signed)
Patient is requesting the following refill  Requested Prescriptions     Pending Prescriptions Disp Refills    evolocumab (REPATHA SURECLICK) 140 mg/mL PnIj 6 mL 3     Sig: Inject the contents of 1 pen (140 mg) under the skin every fourteen (14) days.       Recent Visits  Date Type Provider Dept   01/18/23 Office Visit Powell-Tillman, Valetta Close, MD [facility] Internal Medicine   11/24/22 Office Visit Powell-Tillman, Valetta Close, MD [facility] Internal Medicine   10/21/22 Office Visit Powell-Tillman, Valetta Close, MD [facility] Internal Medicine   09/16/22 Office Visit Powell-Tillman, Valetta Close, MD [facility] Internal Medicine   08/07/22 Office Visit Powell-Tillman, Valetta Close, MD [facility] Internal Medicine   03/30/22 Office Visit Powell-Tillman, Valetta Close, MD [facility] Internal Medicine   Showing recent visits within past 365 days with a meds authorizing provider and meeting all other requirements  Future Appointments  Date Type Provider Dept   07/20/23 Appointment Powell-Tillman, Valetta Close, MD [facility] Internal Medicine   Showing future appointments within next 365 days with a meds authorizing provider and meeting all other requirements       Labs: Cholesterol:   Cholesterol (mg/dL)   Date Value   16/06/9603 168     Cholesterol, Total (mg/dL)   Date Value   54/05/8118 248 (H)   ,   Triglycerides (mg/dL)   Date Value   14/78/2956 115   10/20/2018 121   ,   HDL (mg/dL)   Date Value   21/30/8657 63 (H)   10/20/2018 49   ,   LDL calculated (mg/dl)   Date Value   84/69/6295 174.8 (H)     LDL Calculated (mg/dL)   Date Value   28/41/3244 82

## 2023-03-03 DIAGNOSIS — I10 Essential (primary) hypertension: Principal | ICD-10-CM

## 2023-03-03 DIAGNOSIS — I1A0 Resistant hypertension: Principal | ICD-10-CM

## 2023-03-03 MED ORDER — AMLODIPINE 10 MG TABLET
ORAL_TABLET | Freq: Every day | ORAL | 3 refills | 90 days | Status: CP
Start: 2023-03-03 — End: 2024-03-02

## 2023-03-10 NOTE — Unmapped (Signed)
[facility] Refill Coordination Note    Specialty Medication(s) to be Shipped:   General Specialty: Repatha    Other medication(s) to be shipped: No additional medications requested for fill at this time     Kayla Knight, DOB: 04-Jan-1941  Phone: [phone] (home)       All above HIPAA information was verified with patient.     Was a Nurse, learning disability used for this call? No    Completed refill call assessment today to schedule patient's medication shipment from the [facility] [phone]).  All relevant notes have been reviewed.     Specialty medication(s) and dose(s) confirmed: Regimen is correct and unchanged.   Changes to medications: Shruthika reports no changes at this time.  Changes to insurance: No  New side effects reported not previously addressed with a pharmacist or physician: None reported  Questions for the pharmacist: No    Confirmed patient received a Conservation officer, historic buildings and a Surveyor, mining with first shipment. The patient will receive a drug information handout for each medication shipped and additional FDA Medication Guides as required.       DISEASE/MEDICATION-SPECIFIC INFORMATION        For patients on injectable medications: Patient currently has 0 doses left.  Next injection is scheduled for 6/15.    SPECIALTY MEDICATION ADHERENCE     Medication Adherence    Patient reported X missed doses in the last month: 0  Specialty Medication: repatha sureclick 140mg /ml              Were doses missed due to medication being on hold? No    repatha sureclick 140mg /ml  : 0 days of medicine on hand       REFERRAL TO PHARMACIST     Referral to the pharmacist: Not needed      [facility]     Shipping address confirmed in Epic.       Delivery Scheduled: Yes, Expected medication delivery date: 6/14.     Medication will be delivered via Same Day Courier to the prescription address in Epic WAM.    Westley Gambles   [facility] Specialty Technician

## 2023-04-02 DIAGNOSIS — R232 Flushing: Principal | ICD-10-CM

## 2023-04-02 MED ORDER — VENLAFAXINE ER 75 MG CAPSULE,EXTENDED RELEASE 24 HR
ORAL_CAPSULE | Freq: Every day | ORAL | 0 refills | 90.00000 days | Status: CP
Start: 2023-04-02 — End: 2023-09-29

## 2023-05-12 DIAGNOSIS — G478 Other sleep disorders: Principal | ICD-10-CM

## 2023-05-12 DIAGNOSIS — I1A0 Resistant hypertension: Principal | ICD-10-CM

## 2023-05-12 NOTE — Unmapped (Signed)
Patient called regarding Sleep Study testing that was ordered by Dr. Paulino Rily.  States that she made an appointment but can't get in until November.  States that she can't sleep and is up two to three times nightly due to increased phlegm in her throat and snoring.  States that she is requesting an emergency consult and that if she can't get in for the sleep study she is requesting a referral to a pulmonologist or someone who can help her.  States that some nights she is afraid that she won't wake up and she can't go on this way.  States she has thick,thick phlegm in her throat every night.

## 2023-05-14 ENCOUNTER — Ambulatory Visit: Admit: 2023-05-14 | Discharge: 2023-05-15 | Payer: MEDICARE | Attending: Internal Medicine | Primary: Internal Medicine

## 2023-05-14 DIAGNOSIS — R0982 Postnasal drip: Principal | ICD-10-CM

## 2023-05-14 NOTE — Unmapped (Signed)
Assessment and Plan      1. Post-nasal drip      Suspect breathing difficulty is due to post nasal drip.  Recommended regular use of saline rinses as she prefers to avoid pharmaceuticals.  Follow up as scheduled with appropriate provider or sooner prn.    Referral to pulmonology per patient request (already has an appointment).  Follow up as scheduled with appropriate provider or sooner prn.      No problem-specific Assessment & Plan notes found for this encounter.      No orders of the defined types were placed in this encounter.      Requested Prescriptions      No prescriptions requested or ordered in this encounter       There are no discontinued medications.    No follow-ups on file.    Signs and symptoms that should prompt return sooner than scheduled were reviewed with the patient.      PCMH Components:     Medication adherence and barriers to the treatment plan have been addressed. Opportunities to optimize healthy behaviors have been discussed. Patient / caregiver voiced understanding.        HPI      Chief Complaint    Chief Complaint   Patient presents with    Referral Setup       Kayla Knight presents for evaluation and management of the following concern(s):    Im waking up every night thinking I'm going to die if I don't get this phlegm out of my throat.  Sleep is disrupted.  There is also witnessed snoring and apnea.  She denies significant wheezing or cough. There is cough intermittently.  No fever or chills, hemoptysis  notes some itchy eyes.  Denies sinus congestion or headache.  Using eucalyptus oil diffuser helps.           Health Maintenance reviewed - no intervention today      I have reviewed the patient's medical history in detail and updated the computerized patient record.              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)    Neck pain    Encounter for subsequent annual wellness visit (AWV) in Medicare patient    Lipoma of torso    Skin lesion    Hearing loss    Lichen sclerosus et atrophicus of the vulva    Renal artery stenosis, native, bilateral (CMS-HCC)    Hyperlipidemia    Aftercare following joint replacement    Presence of left artificial knee joint    Hypokalemia    Decreased GFR    Nonrheumatic aortic valve insufficiency    Endometrial thickening on ultrasound    Weakness of both hips    Right hip pain    Hot flashes         Allergies:  Penicillins, Lisinopril, Statins-hmg-coa reductase inhibitors, and Sulfa (sulfonamide antibiotics)    Medications:   Outpatient Medications Prior to Visit   Medication Sig Dispense Refill    amlodipine (NORVASC) 10 MG tablet Take 1 tablet (10 mg total) by mouth daily. 90 tablet 3    empty container (SHARPS-A-GATOR DISPOSAL SYSTEM) Misc Use as directed for sharps disposal 1 each 2    evolocumab (REPATHA SURECLICK) 140 mg/mL  PnIj Inject the contents of 1 pen (140 mg) under the skin every fourteen (14) days. 6 mL 3    venlafaxine (EFFEXOR-XR) 75 MG 24 hr capsule TAKE 1 CAPSULE (75 MG TOTAL) BY MOUTH DAILY. 90 capsule 0    clopidogreL (PLAVIX) 75 mg tablet Take 1 tablet (75 mg total) by mouth daily. 90 tablet 3    spironolactone (ALDACTONE) 50 MG tablet Take 1 tablet (50 mg total) by mouth daily. (Patient taking differently: Take 1 tablet (50 mg total) by mouth daily. Taking 1/2 tab daily) 90 tablet 3     No facility-administered medications prior to visit.       Medical History:  Past Medical History:   Diagnosis Date    Anemia     past history, over 40 yrs ago    Arthritis     hand, knees, joints    Asthma     past history, no recent attacks    Asthma     patient stated due to asthmatic medication    Cataract 2008    Surgery    Diabetes mellitus (CMS-HCC)     Type II, on 9/15 patient stated she was not longer diabetic, not taking meds    Dry eye     Hearing impairment     Heart murmur 2016    [facility] Verdie [facility]    Heart valve disease     Heart mumur, pt reports diagnosis 10 yrs ago (05/10/20)    History of stent insertion of renal artery 01/29/2020    ASA indefinitely and plavix x 6 months per procedure note    Hypertension     pt reports taking meds since about 1995    Neuromuscular disorder (CMS-HCC) 2014    Feet & Legs    Tear of meniscus of knee     4.5 yrs ago    Urinary incontinence     seen a doctor about nighttime bathroom use (adressed in 2019)       Surgical History:  Past Surgical History:   Procedure Laterality Date    CATARACT EXTRACTION W/ INTRAOCULAR LENS IMPLANT Left 02/27/2016    EYE SURGERY  01/30/16    Cataract surgery is scheduled.    JOINT REPLACEMENT  Knee    KNEE CARTILAGE SURGERY Right 2007, 2008    x 2    KNEE SURGERY      pt reports total left knee joint replacement surgery around 2016, 2017    PLANTAR FASCIECTOMY Left     PR REVASCULARIZE FEM/POP ARTERY,ANGIOPLASTY/STENT N/A 01/29/2020    Procedure: Peripheral Angiography W Intervetion;  Surgeon: Alvira Philips, MD;  Location: [facility] CATH;  Service: Cardiology    PR REVASCULARIZE FEM/POP ARTERY,ANGIOPLASTY/STENT Right 12/26/2021    Procedure: right renal artery intervention;  Surgeon: Alvira Philips, MD;  Location: [facility] CATH;  Service: Cardiology    PR UPPER GI ENDOSCOPY,BIOPSY N/A 04/01/2021    Procedure: UGI ENDOSCOPY; WITH BIOPSY, SINGLE OR MULTIPLE;  Surgeon: Jarvis Morgan, MD;  Location: [facility] PROCED[facility];  Service: Gastroenterology    PR XCAPSL CTRC RMVL INSJ IO LENS PROSTH W/O ECP Right 02/13/2016    Procedure: EXTRACAPSULAR CATARACT REMOVAL W/INSERTION OF INTRAOCULAR LENS PROSTHESIS, MANUAL OR MECHANICAL TECHNIQUE;  Surgeon: Garey Ham, MD;  Location: [facility];  Service: Ophthalmology    PR XCAPSL CTRC RMVL INSJ IO LENS PROSTH W/O ECP Left 02/27/2016    Procedure: EXTRACAPSULAR CATARACT REMOVAL W/INSERTION OF INTRAOCULAR LENS PROSTHESIS,  MANUAL OR MECHANICAL TECHNIQUE;  Surgeon: Garey Ham, MD;  Location: [facility];  Service: Ophthalmology    RENAL ARTERY STENT  01/11/2020    ROOT CANAL      TUBAL LIGATION  1973    [facility]    WISDOM TOOTH EXTRACTION         Social History:  Tobacco use:   reports that she quit smoking about 39 years ago. Her smoking use included cigarettes. She has been exposed to tobacco smoke. She has never used smokeless tobacco.  Alcohol use:   reports that she does not currently use alcohol.  Drug use:  reports no history of drug use.      Family History:  Family History   Problem Relation Age of Onset    Heart disease Mother     Hypertension Mother     Dementia Mother     Arthritis Mother     Heart disease Father     Thyroid disease Father     Glaucoma Father     Alcohol abuse Father     Heart attack Father 68    Arthritis Father     Hypertension Father         Massive heart attack    Arthritis Sister     Glaucoma Sister     Hypertension Sister     Diabetes Sister     Diabetes Daughter     Diabetes Paternal Aunt     No Known Problems Brother     No Known Problems Maternal Aunt     No Known Problems Maternal Uncle     No Known Problems Paternal Uncle     No Known Problems Maternal Grandmother     No Known Problems Maternal Grandfather     No Known Problems Paternal Grandmother     No Known Problems Paternal Grandfather     Arthritis Sister         Noticeable in extremities    COPD Sister     Depression Sister     Hypertension Sister     Glaucoma Sister     Asthma Daughter         Ocassional    Hypertension Daughter     Miscarriages / Stillbirths Daughter     Cancer Maternal Aunt         Died more than 58yrs ago    Early death Maternal Aunt     Diabetes Paternal Aunt         4 Aunts    Kidney disease Paternal Aunt     Amblyopia Neg Hx     Blindness Neg Hx     Cancer Neg Hx     Cataracts Neg Hx     Macular degeneration Neg Hx     Retinal detachment Neg Hx     Strabismus Neg Hx     Stroke Neg Hx            Review of Systems:    Pertinent review of systems is noted in the HPI.           Physical Exam      BP 130/74 (BP Site: L Arm, BP Position: Sitting, BP Cuff Size: Medium)  - Pulse 74  - Temp 37 ??C (98.6 ??F) (Oral)  - Ht 165 cm (5' 4.96)  - Wt 73 kg (161 lb)  - SpO2 97%  - BMI 26.82 kg/m??       BP Readings from Last 3  Encounters:   05/14/23 130/74   01/18/23 169/80   01/18/23 152/78       Wt Readings from Last 3 Encounters:   05/14/23 73 kg (161 lb)   01/18/23 71.7 kg (158 lb)   01/18/23 70.8 kg (156 lb)       She appears well, in no apparent distress.  Alert and oriented times three, pleasant and cooperative. Vital signs are as documented in vital signs section.  Not coughing or sniffling.  Normal sclerae/conjunctivae.  Mouth and throat are normal.  Ears are normal.  Cor is regular rate and rhythm with unchanged murmur.  Lungs are clear throughout with normal respiratory effort.  No lower extremity edema

## 2023-05-17 ENCOUNTER — Ambulatory Visit: Admit: 2023-05-17 | Discharge: 2023-05-18 | Payer: MEDICARE | Attending: Family | Primary: Family

## 2023-05-17 NOTE — Unmapped (Signed)
[facility] CLINIC NOTE      05/17/2023    PCP: Virgina Evener, MD       ASSESSMENT/PLAN:  Any labs and radiology results that were available during my care of the patient were independently reviewed by me and considered in my medical decision making.    Impression:   82 yo female with PMH significant for T2DM, HTN, OA, mitral regurgitation, renal artery stenosis and non rheumatic aortic valve insufficiency presenting today with body aches, nasal congestion cough and fatigue    Rapid COVID today is + fortunately her symptoms are mild and she is happy to continue OTC medications as needed for management    Plan:  Continue supportive care at home  Acetaminophen and or ibuprofen prn pain or fever      Problem List Items Addressed This Visit    None  Visit Diagnoses       Fatigue, unspecified type    -  Primary    Cough, unspecified type        Runny nose        SOB (shortness of breath)        COVID-19                RTC: if symptoms worsen or fail to improve      Follow-up as Needed     Items for PCP to follow-up on next visit: COVID           -----------------------------------------------------------  SUBJECTIVE:  Chief Complaint   Patient presents with    Nasal Congestion     Started on Wednesday. Was told by her PCP that was allergies    Fatigue    Cough     Is productive. Is feeling SOB    Generalized Body Aches       HPI:  Kayla Knight is a 82 y.o. female that presents to clinic today regarding the following issues:    # sick visit  Day 6  Fatigue  Cough  SOB  Runny nose  Body aches    Went to her PCP last week, was told it was allergies    Denies: n/v/d, fever    ROS: Please see HPI.     -----------------------------------------------------------  PAST MEDICAL HISTORY:   Past Medical History:   Diagnosis Date    Anemia     past history, over 40 yrs ago    Arthritis     hand, knees, joints    Asthma     past history, no recent attacks    Asthma     patient stated due to asthmatic medication    Cataract 2008    Surgery    Diabetes mellitus (CMS-HCC)     Type II, on 9/15 patient stated she was not longer diabetic, not taking meds    Dry eye     Hearing impairment     Heart murmur 2016    [facility] Verdie [facility]ire [facility]    Heart valve disease     Heart mumur, pt reports diagnosis 10 yrs ago (05/10/20)    History of stent insertion of renal artery 01/29/2020    ASA indefinitely and plavix x 6 months per procedure note    Hypertension     pt reports taking meds since about 1995    Neuromuscular disorder (CMS-HCC) 2014    Feet & Legs    Tear of meniscus of knee  4.5 yrs ago    Urinary incontinence     seen a doctor about nighttime bathroom use (adressed in 2019)       PAST SURGICAL HISTORY:  Past Surgical History:   Procedure Laterality Date    CATARACT EXTRACTION W/ INTRAOCULAR LENS IMPLANT Left 02/27/2016    EYE SURGERY  01/30/16    Cataract surgery is scheduled.    JOINT REPLACEMENT  Knee    KNEE CARTILAGE SURGERY Right 2007, 2008    x 2    KNEE SURGERY      pt reports total left knee joint replacement surgery around 2016, 2017    PLANTAR FASCIECTOMY Left     PR REVASCULARIZE FEM/POP ARTERY,ANGIOPLASTY/STENT N/A 01/29/2020    Procedure: Peripheral Angiography W Intervetion;  Surgeon: Alvira Philips, MD;  Location: [facility] CATH;  Service: Cardiology    PR REVASCULARIZE FEM/POP ARTERY,ANGIOPLASTY/STENT Right 12/26/2021    Procedure: right renal artery intervention;  Surgeon: Alvira Philips, MD;  Location: [facility] CATH;  Service: Cardiology    PR UPPER GI ENDOSCOPY,BIOPSY N/A 04/01/2021    Procedure: UGI ENDOSCOPY; WITH BIOPSY, SINGLE OR MULTIPLE;  Surgeon: Jarvis Morgan, MD;  Location: [facility];  Service: Gastroenterology    PR XCAPSL CTRC RMVL INSJ IO LENS PROSTH W/O ECP Right 02/13/2016    Procedure: EXTRACAPSULAR CATARACT REMOVAL W/INSERTION OF INTRAOCULAR LENS PROSTHESIS, MANUAL OR MECHANICAL TECHNIQUE;  Surgeon: Garey Ham, MD;  Location: [facility] OR [facility];  Service: Ophthalmology    PR XCAPSL CTRC RMVL INSJ IO LENS PROSTH W/O ECP Left 02/27/2016    Procedure: EXTRACAPSULAR CATARACT REMOVAL W/INSERTION OF INTRAOCULAR LENS PROSTHESIS, MANUAL OR MECHANICAL TECHNIQUE;  Surgeon: Garey Ham, MD;  Location: [facility] OR [facility];  Service: Ophthalmology    RENAL ARTERY STENT  01/11/2020    ROOT CANAL      TUBAL LIGATION  1973    [facility]    WISDOM TOOTH EXTRACTION         SOCIAL HISTORY:  Social History     Socioeconomic History    Marital status: Single   Tobacco Use    Smoking status: Former     Current packs/day: 0.00     Types: Cigarettes     Quit date: 05/19/1984     Years since quitting: 39.0     Passive exposure: Past    Smokeless tobacco: Never   Vaping Use    Vaping status: Never Used   Substance and Sexual Activity    Alcohol use: Yes     Comment: occassional    Drug use: Never    Sexual activity: Not Currently     Partners: Male   Social History Narrative    Exercise: water aerobics 3-4 x week for an hour        Eye: Dr.Merrit @ [facility] 2018        Dentist: [facility] 2018     Social Determinants of Health     Financial Resource Strain: Low Risk  (01/04/2023)    Overall Financial Resource Strain (CARDIA)     Difficulty of Paying Living Expenses: Not hard at all   Food Insecurity: No Food Insecurity (01/04/2023)    Hunger Vital Sign     Worried About Running Out of Food in the Last Year: Never true     Ran Out of Food in the Last Year: Never true   Transportation Needs: No Transportation Needs (01/04/2023)    PRAPARE - Transportation  Lack of Transportation (Medical): No     Lack of Transportation (Non-Medical): No   Physical Activity: Sufficiently Active (01/04/2023)    Exercise Vital Sign     Days of Exercise per Week: 7 days     Minutes of Exercise per Session: 30 min   Stress: No Stress Concern Present (01/04/2023)    [employer] of Occupational Health - Occupational Stress Questionnaire     Feeling of Stress : Only a little   Social Connections: Moderately Isolated (01/04/2023)    Social Connection and Isolation Panel [NHANES]     Frequency of Communication with Friends and Family: More than three times a week     Frequency of Social Gatherings with Friends and Family: More than three times a week     Attends Religious Services: Never     Database administrator or Organizations: Yes     Attends Engineer, structural: More than 4 times per year     Marital Status: Divorced       FAMILY HISTORY:  Family History   Problem Relation Age of Onset    Heart disease Mother     Hypertension Mother     Dementia Mother     Arthritis Mother     Heart disease Father     Thyroid disease Father     Glaucoma Father     Alcohol abuse Father     Heart attack Father 27    Arthritis Father     Hypertension Father         Massive heart attack    Arthritis Sister     Glaucoma Sister     Hypertension Sister     Diabetes Sister     Diabetes Daughter     Diabetes Paternal Aunt     No Known Problems Brother     No Known Problems Maternal Aunt     No Known Problems Maternal Uncle     No Known Problems Paternal Uncle     No Known Problems Maternal Grandmother     No Known Problems Maternal Grandfather     No Known Problems Paternal Grandmother     No Known Problems Paternal Grandfather     Arthritis Sister         Noticeable in extremities    COPD Sister     Depression Sister     Hypertension Sister     Glaucoma Sister     Asthma Daughter         Ocassional    Hypertension Daughter     Miscarriages / [place] Daughter     Cancer Maternal Aunt         Died more than 37yrs ago    Early death Maternal Aunt     Diabetes Paternal Aunt         4 Aunts    Kidney disease Paternal Aunt     Amblyopia Neg Hx     Blindness Neg Hx     Cancer Neg Hx     Cataracts Neg Hx     Macular degeneration Neg Hx     Retinal detachment Neg Hx     Strabismus Neg Hx     Stroke Neg Hx        ___________________________________  CURRENT MEDS:  Current Outpatient Medications   Medication Sig Dispense Refill    amlodipine (NORVASC) 10 MG tablet Take 1 tablet (10 mg total) by mouth daily. 90 tablet 3    clopidogreL (  PLAVIX) 75 mg tablet Take 1 tablet (75 mg total) by mouth daily. 90 tablet 3    empty container (SHARPS-A-GATOR DISPOSAL SYSTEM) Misc Use as directed for sharps disposal 1 each 2    evolocumab (REPATHA SURECLICK) 140 mg/mL PnIj Inject the contents of 1 pen (140 mg) under the skin every fourteen (14) days. 6 mL 3    spironolactone (ALDACTONE) 50 MG tablet Take 1 tablet (50 mg total) by mouth daily. (Patient taking differently: Take 1 tablet (50 mg total) by mouth daily. Taking 1/2 tab daily) 90 tablet 3    venlafaxine (EFFEXOR-XR) 75 MG 24 hr capsule TAKE 1 CAPSULE (75 MG TOTAL) BY MOUTH DAILY. 90 capsule 0     No current facility-administered medications for this visit.       ___________________________________  ALLERGIES:  Allergies   Allergen Reactions    Penicillins Itching and Swelling    Lisinopril Cough    Statins-Hmg-Coa Reductase Inhibitors      Ineffective at controlling cholesterol when tried in the past.    Sulfa (Sulfonamide Antibiotics) Itching     -----------------------------------------------------------  OBJECTIVE:  Physical Exam:  VITALS:   Vitals:    05/17/23 1045   BP: 142/67   Pulse: 80   Resp:    Temp:    SpO2:     Wt:   Wt Readings from Last 3 Encounters:   05/17/23 73 kg (161 lb)   05/14/23 73 kg (161 lb)   01/18/23 71.7 kg (158 lb)       PHQ-2 Score:           Physical Exam  Vitals and nursing note reviewed.   Constitutional:       Appearance: Normal appearance. She is not toxic-appearing.   Eyes:      Conjunctiva/sclera: Conjunctivae normal.   Cardiovascular:      Rate and Rhythm: Normal rate and regular rhythm.      Pulses: Normal pulses.      Heart sounds: Normal heart sounds.   Pulmonary:      Effort: Pulmonary effort is normal.      Breath sounds: Normal breath sounds.   Musculoskeletal:         General: Normal range of motion. Skin:     General: Skin is warm and dry.      Capillary Refill: Capillary refill takes 2 to 3 seconds.   Neurological:      General: No focal deficit present.      Mental Status: She is alert and oriented to person, place, and time.   Psychiatric:         Mood and Affect: Mood normal.         Behavior: Behavior normal.         Thought Content: Thought content normal.         Judgment: Judgment normal.               LABS:  Office Visit on 05/17/2023   Component Date Value Ref Range Status    POCT SARS-CoV-2 NAA 05/17/2023 Positive (A)  Negative Final       STUDIES:  MRI abdomen with and without contrast    Result Date: 10/06/2022  Exam:  MRI of the Kidneys without and with Contrast  History:  Renal lesions.  Technique: Breath-hold multiplanar assessment of the kidneys was undertaken. T1, T2 gradient echo, and steady-state images were created, with fat suppression per standardized protocol to assess tissue characteristics. Multi-oblique unilateral or select plane bilateral  renal perfusion was then undertaken, creating cortical, nephrographic, and delayed urographic images after the uneventful administration contrast. Subtraction images were created as needed to clarify enhancement characteristics that might increase specificity of diagnosis. Delayed contrast MRU  (urograms) were created if felt diagnostically useful. IV contrast: 8 mL of Gadavist.   Comparison:  09/23/2022 and priors.  Findings: LOWER THORAX:  Lungs grossly clear. No pleural effusion.  LIVER:  Normal size. Smooth contour. No significant steatosis. Cyst in segment VI (series 6, image 13) measuring 1.6 cm. Patent hepatic vasculature.  GALLBLADDER: No stones or sludge. Non-distended. No wall thickening, hyperenhancement, or pericholecystic fat stranding.  BILE DUCTS: No intrahepatic biliary ductal dilation or irregularity. Common bile duct normal in caliber.  PANCREAS:  Normal background parenchyma. Main pancreatic duct normal in caliber.  SPLEEN: Normal size.  ADRENALS:  Normal morphology.  KIDNEYS/URETERS:  - No hydroureteronephrosis. - Right inferior pole lesion (series 4, image 80) measuring 1.5 x 1.5 x 1.2 cm, unchanged from 01/23/2022. Lesion is circumscribed, T2 hyperintense, T1 hypointense, with single thin non-enhancing septation, without reduced diffusion. Bosniak II. - Left posterior interpolar lesion (image 10) measuring 2.6 x 2.4 x 1.9 cm, unchanged. Lesion is circumscribed, T2 hyperintense, T1 hypointense, with few thin non-enhancing septations, without reduced diffusion. Bosniak II. - Bilateral subcentimeter simple cysts. Bosniak I.  VASCULAR:  Normal caliber. Severe atherosclerosis of the abdominal aorta and its branches. Left renal artery stent. Stent patency not well evaluated due to susceptibility artifact.  LYMPH NODES:  No lymphadenopathy.  BOWEL: Small hiatal hernia. No evidence of enterocolitis. No obstruction. Moderate colonic stool burden. Colonic diverticulosis.  PERITONEUM: No ascites.  BONES:  No aggressive lesion. No acute abnormality.  SOFT TISSUES: Normal.      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 No further follow-up necessary.  Signed (Electronic Signature): 10/06/2022 3:50 PM Signed By: Trinidad Curet, MD       [facility]  [address], [place], [place] [address]  Telephone [phone]  Fax [phone]  [email or web address]

## 2023-05-18 DIAGNOSIS — G478 Other sleep disorders: Principal | ICD-10-CM

## 2023-05-18 DIAGNOSIS — I1A0 Resistant hypertension: Principal | ICD-10-CM

## 2023-05-18 NOTE — Unmapped (Signed)
patient left my chart message wanting to reschedule a rgyn appt she canceled with Crista Curb

## 2023-05-28 NOTE — Unmapped (Signed)
[facility] Refill Coordination Note    Specialty Lite Medication(s) to be Shipped:   Repatha    Other medication(s) to be shipped: No additional medications requested for fill at this time     Kayla Knight, DOB: Dec 31, 1940  Phone: [phone] (home)       All above HIPAA information was verified with patient.     Was a Nurse, learning disability used for this call? No    Changes to medications: [name] reports no changes at this time.  Changes to insurance: No      REFERRAL TO PHARMACIST     Referral to the pharmacist: Not needed      Kayla Knight     Shipping address confirmed in Epic.     Delivery Scheduled: Yes, Expected medication delivery date: 06/02/23.     Medication will be delivered via Same Day Courier to the prescription address in Epic WAM.    Kayla Knight   [name] Specialty Technician

## 2023-06-02 MED FILL — REPATHA SURECLICK 140 MG/ML SUBCUTANEOUS PEN INJECTOR: SUBCUTANEOUS | 84 days supply | Qty: 6 | Fill #1

## 2023-06-17 ENCOUNTER — Encounter: Payer: Self-pay | Admitting: Primary Care

## 2023-06-17 ENCOUNTER — Ambulatory Visit (INDEPENDENT_AMBULATORY_CARE_PROVIDER_SITE_OTHER): Payer: Medicare HMO | Admitting: Primary Care

## 2023-06-17 VITALS — BP 138/64 | HR 57 | Temp 98.4°F | Ht 66.0 in | Wt 163.2 lb

## 2023-06-17 DIAGNOSIS — R0982 Postnasal drip: Secondary | ICD-10-CM

## 2023-06-17 DIAGNOSIS — R0681 Apnea, not elsewhere classified: Secondary | ICD-10-CM

## 2023-06-17 NOTE — Progress Notes (Signed)
@Patient  ID: [name], female    DOB: 1940/11/08, 82 y.o.   MRN: [id number]  Chief Complaint  Patient presents with   Consult    Sleep consult-c/o thick phlegm wakes up,snoring     Referring provider: Mosetta Anis*  HPI: 82 year old female, former smoker.  Past medical history significant for hypertension, mitral regurgitation, pulmonary hypertension, type 2 diabetes, hyperlipidemia, just apnea  06/17/2023 Patient presents today for sleep consult.  She has symptoms of snoring and witnessed apnea.  She reports waking up at night with thick phlegm which will cause her to choke, she has a hard time bringing up mucus. Her sleep is disrupted. He daughter has witnessed her stop breathing a "long time" at night while sleeping. She had a sleep study more than 10 years ago. Her sister and daughter both have sleep apnea.    Allergies  Allergen Reactions   Elemental Sulfur Swelling and Rash   Penicillins Swelling and Rash    Immunization History  Administered Date(s) Administered   Moderna Sars-Covid-2 Vaccination 10/23/2019, 11/20/2019, 09/11/2020    Past Medical History:  Diagnosis Date   Diabetes mellitus without complication (HCC)    Hypertension     Tobacco History: Social History   Tobacco Use  Smoking Status Former  Smokeless Tobacco Not on file   Counseling given: Not Answered   Outpatient Medications Prior to Visit  Medication Sig Dispense Refill   amLODipine (NORVASC) 10 MG tablet Take 10 mg by mouth daily.     clopidogrel (PLAVIX) 75 MG tablet Take 75 mg by mouth daily.     REPATHA SURECLICK 140 MG/ML SOAJ 140 mg. Twice a month     spironolactone (ALDACTONE) 50 MG tablet Take 1 tablet by mouth daily. Taking 1/2 tablet daily     aspirin 81 MG tablet Take 81 mg by mouth daily. (Patient not taking: Reported on 06/17/2023)     doxycycline (MONODOX) 50 MG capsule Take 50 mg by mouth 2 (two) times daily. (Patient not taking: Reported on 06/17/2023)      SUMAtriptan (IMITREX) 25 MG tablet Take 25 mg by mouth every 2 (two) hours as needed for migraine. May repeat in 2 hours if headache persists or recurs. (Patient not taking: Reported on 06/17/2023)     No facility-administered medications prior to visit.    Review of Systems  Review of Systems  Constitutional: Negative.   HENT:  Positive for congestion and postnasal drip.   Respiratory:  Positive for apnea.   Psychiatric/Behavioral:  Positive for sleep disturbance.    Physical Exam  BP 138/64 (BP Location: Left Arm, Cuff Size: Normal)   Pulse (!) 57   Temp 98.4 F (36.9 C) (Temporal)   Ht 5\' 6"  (1.676 m)   Wt 163 lb 3.2 oz (74 kg)   SpO2 97%   BMI 26.34 kg/m  Physical Exam Constitutional:      General: She is not in acute distress.    Appearance: Normal appearance. She is not ill-appearing.  HENT:     Head: Normocephalic and atraumatic.     Mouth/Throat:     Mouth: Mucous membranes are moist.     Pharynx: Oropharynx is clear. No oropharyngeal exudate.     Comments: Mallampati class I Cardiovascular:     Rate and Rhythm: Normal rate and regular rhythm.  Pulmonary:     Effort: Pulmonary effort is normal.     Breath sounds: Normal breath sounds. No wheezing or rhonchi.  Musculoskeletal:  General: Normal range of motion.     Cervical back: Normal range of motion and neck supple.  Skin:    General: Skin is warm and dry.  Neurological:     General: No focal deficit present.     Mental Status: She is alert and oriented to person, place, and time. Mental status is at baseline.  Psychiatric:        Mood and Affect: Mood normal.        Behavior: Behavior normal.        Thought Content: Thought content normal.        Judgment: Judgment normal.      Lab Results:  CBC    Component Value Date/Time   WBC 6.4 04/29/2015 1230   RBC 5.61 (H) 04/29/2015 1230   HGB 13.3 04/29/2015 1230   HCT 41.8 04/29/2015 1230   PLT 298 04/29/2015 1230   MCV 74.4 (L) 04/29/2015  1230   MCH 23.7 (L) 04/29/2015 1230   MCHC 31.8 (L) 04/29/2015 1230   RDW 16.6 (H) 04/29/2015 1230    BMET    Component Value Date/Time   NA 137 04/29/2015 1230   K 4.0 04/29/2015 1230   CL 102 04/29/2015 1230   CO2 27 04/29/2015 1230   GLUCOSE 94 04/29/2015 1230   BUN 23 (H) 04/29/2015 1230   CREATININE 0.98 04/29/2015 1230   CALCIUM 9.2 04/29/2015 1230   GFRNONAA 56 (L) 04/29/2015 1230   GFRAA >60 04/29/2015 1230    BNP    Component Value Date/Time   BNP 375.0 (H) 02/12/2015 0309    ProBNP No results found for: "PROBNP"  Imaging: No results found.   Assessment & Plan:   Witnessed episode of apnea - Patient has symptoms of snoring and witnessed apnea. Associated postnasal drip, she has woken up choking on mucus. She has family history of sleep apnea.  Concern patient could have underlying obstructive sleep apnea, needs home sleep study evaluate.  We reviewed risk of untreated sleep apnea including cardiac arrhythmias, pulmonary hypertension, diabetes and stroke.  We also discussed treatment options including weight loss, oral appliance, CPAP therapy or referral to ENT for possible surgical options.  Encourage patient focus on side sleeping position or elevate head of bed 30 degrees.  Advised against driving if experiencing excessive daytime sleepiness fatigue.  FU in 1-2 weeks after sleep study for results.   Post-nasal drip - Patient wakes up at night with PND symptoms and thick mucus in the back of her throat. Recommend patient use saline nasal spray at bedtime and take prn robitussin to loosen congestion. Consider ENT referral if not better with above plan and if sleep study is not diagnostic.   [name], NP 06/17/2023

## 2023-06-17 NOTE — Assessment & Plan Note (Signed)
-   Patient wakes up at night with PND symptoms and thick mucus in the back of her throat. Recommend patient use saline nasal spray at bedtime and take prn robitussin to loosen congestion. Consider ENT referral if not better with above plan and if sleep study is not diagnostic.

## 2023-06-17 NOTE — Progress Notes (Signed)
Reviewed and agree with assessment/plan.   Coralyn Helling, MD [facility] Pulmonary/Critical Care 06/17/2023, 12:23 PM Pager:  [phone]

## 2023-06-17 NOTE — Assessment & Plan Note (Signed)
-   Patient has symptoms of snoring and witnessed apnea. Associated postnasal drip, she has woken up choking on mucus. She has family history of sleep apnea.  Concern patient could have underlying obstructive sleep apnea, needs home sleep study evaluate.  We reviewed risk of untreated sleep apnea including cardiac arrhythmias, pulmonary hypertension, diabetes and stroke.  We also discussed treatment options including weight loss, oral appliance, CPAP therapy or referral to ENT for possible surgical options.  Encourage patient focus on side sleeping position or elevate head of bed 30 degrees.  Advised against driving if experiencing excessive daytime sleepiness fatigue.  FU in 1-2 weeks after sleep study for results.

## 2023-06-17 NOTE — Patient Instructions (Addendum)
Sleep apnea is defined as period of 10 seconds or longer when you stop breathing at night. This can happen multiple times a night. Dx sleep apnea is when this occurs more than 5 times an hour.    Mild OSA 5-15 apneic events an hour Moderate OSA 15-30 apneic events an hour Severe OSA > 30 apneic events an hour   Untreated sleep apnea puts you at higher risk for cardiac arrhythmias, pulmonary HTN, stroke and diabetes  Treatment options include weight loss, side sleeping position, oral appliance, CPAP therapy or referral to ENT for possible surgical options   Recommendations: Sleep with head of bed elevated (wedge pillow) or sleep on your side  Restart saline nasal rinse at bedtime  Follow GERD diet  Work on weight loss efforts if needed Do not drive if experiencing excessive daytime sleepiness of fatigue   Orders: Home sleep study re: loud snoring (ordered)   Follow-up: Please call to schedule follow-up 1-2 weeks after completing home sleep study to review results and treatment if needed (can be virtual)  _________________________________________________________________________________________  Food Choices for Gastroesophageal Reflux Disease, Adult When you have gastroesophageal reflux disease (GERD), the foods you eat and your eating habits are very important. Choosing the right foods can help ease your discomfort. Think about working with a food expert (dietitian) to help you make good choices. What are tips for following this plan? Reading food labels Look for foods that are low in saturated fat. Foods that may help with your symptoms include: Foods that have less than 5% of daily value (DV) of fat. Foods that have 0 grams of trans fat. Cooking Do not fry your food. Cook your food by baking, steaming, grilling, or broiling. These are all methods that do not need a lot of fat for cooking. To add flavor, try to use herbs that are low in spice and acidity. Meal planning  Choose  healthy foods that are low in fat, such as: Fruits and vegetables. Whole grains. Low-fat dairy products. Lean meats, fish, and poultry. Eat small meals often instead of eating 3 large meals each day. Eat your meals slowly in a place where you are relaxed. Avoid bending over or lying down until 2-3 hours after eating. Limit high-fat foods such as fatty meats or fried foods. Limit your intake of fatty foods, such as oils, butter, and shortening. Avoid the following as told by your doctor: Foods that cause symptoms. These may be different for different people. Keep a food diary to keep track of foods that cause symptoms. Alcohol. Drinking a lot of liquid with meals. Eating meals during the 2-3 hours before bed. Lifestyle Stay at a healthy weight. Ask your doctor what weight is healthy for you. If you need to lose weight, work with your doctor to do so safely. Exercise for at least 30 minutes on 5 or more days each week, or as told by your doctor. Wear loose-fitting clothes. Do not smoke or use any products that contain nicotine or tobacco. If you need help quitting, ask your doctor. Sleep with the head of your bed higher than your feet. Use a wedge under the mattress or blocks under the bed frame to raise the head of the bed. Chew sugar-free gum after meals. What foods should eat?  Eat a healthy, well-balanced diet of fruits, vegetables, whole grains, low-fat dairy products, lean meats, fish, and poultry. Each person is different. Foods that may cause symptoms in one person may not cause any symptoms in another  person. Work with your doctor to find foods that are safe for you. The items listed above may not be a complete list of what you can eat and drink. Contact a food expert for more options. What foods should I avoid? Limiting some of these foods may help in managing the symptoms of GERD. Everyone is different. Talk with a food expert or your doctor to help you find the exact foods to  avoid, if any. Fruits Any fruits prepared with added fat. Any fruits that cause symptoms. For some people, this may include citrus fruits, such as oranges, grapefruit, pineapple, and lemons. Vegetables Deep-fried vegetables. [place] fries. Any vegetables prepared with added fat. Any vegetables that cause symptoms. For some people, this may include tomatoes and tomato products, chili peppers, onions and garlic, and horseradish. Grains Pastries or quick breads with added fat. Meats and other proteins High-fat meats, such as fatty beef or pork, hot dogs, ribs, ham, sausage, salami, and bacon. Fried meat or protein, including fried fish and fried chicken. Nuts and nut butters, in large amounts. Dairy Whole milk and chocolate milk. Sour cream. Cream. Ice cream. Cream cheese. Milkshakes. Fats and oils Butter. Margarine. Shortening. Ghee. Beverages Coffee and tea, with or without caffeine. Carbonated beverages. Sodas. Energy drinks. Fruit juice made with acidic fruits, such as orange or grapefruit. Tomato juice. Alcoholic drinks. Sweets and desserts Chocolate and cocoa. Donuts. Seasonings and condiments Pepper. Peppermint and spearmint. Added salt. Any condiments, herbs, or seasonings that cause symptoms. For some people, this may include curry, hot sauce, or vinegar-based salad dressings. The items listed above may not be a complete list of what you should not eat and drink. Contact a food expert for more options. Questions to ask your doctor Diet and lifestyle changes are often the first steps that are taken to manage symptoms of GERD. If diet and lifestyle changes do not help, talk with your doctor about taking medicines. Where to find more information [employer] G[employer]: aboutgerd.org Summary When you have GERD, food and lifestyle choices are very important in easing your symptoms. Eat small meals often instead of 3 large meals a day. Eat your meals slowly  and in a place where you are relaxed. Avoid bending over or lying down until 2-3 hours after eating. Limit high-fat foods such as fatty meats or fried foods. This information is not intended to replace advice given to you by your health care provider. Make sure you discuss any questions you have with your health care provider. Document Revised: 03/25/2020 Document Reviewed: 03/25/2020 Elsevier Patient Education  2024 [employer].

## 2023-07-09 ENCOUNTER — Ambulatory Visit: Admit: 2023-07-09 | Discharge: 2023-07-10 | Payer: MEDICARE

## 2023-07-09 DIAGNOSIS — Z961 Presence of intraocular lens: Principal | ICD-10-CM

## 2023-07-09 DIAGNOSIS — E119 Type 2 diabetes mellitus without complications: Principal | ICD-10-CM

## 2023-07-09 DIAGNOSIS — H04123 Dry eye syndrome of bilateral lacrimal glands: Principal | ICD-10-CM

## 2023-07-09 DIAGNOSIS — H43813 Vitreous degeneration, bilateral: Principal | ICD-10-CM

## 2023-07-09 DIAGNOSIS — H40003 Preglaucoma, unspecified, bilateral: Principal | ICD-10-CM

## 2023-07-09 NOTE — Unmapped (Signed)
I saw and evaluated the patient, participating in the key portions of the service.  I reviewed the resident???s note.  I agree with the resident???s findings and plan. Claris Pong, MD

## 2023-07-09 NOTE — Unmapped (Signed)
82 y/o woman with htn and migraines and DM.  Here for crusting of eyes. Thought she had an infection or could be TED because daughter has thyroid problems. She does not have any thyroid problems herself     1. Dry eye, syndrome Pt has 3+ PEE on the cornea surface OU     Prior treatment:   - Restasis  - dissolvable plugs (didn't help)   - Inflamase forte (didn't help)      Plan   - Frequent ATs that are preservative free q1h  - Start warm compresses  - fish oil 2 gm daily  - Next Available Dr. Julaine Hua for Dry Eye evaluation         2. 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 Glaucoma suspect; family history and has superior quadrant thinning today on OCT   - HVF 24-2 SS next visit   - Follow up with comprehensive     4. DM without DR, was dilated 07/09/23  - Follow up with comprehensive       Seen with Dr. Armond Hang MD  PGY 2  [facility]  [facility]

## 2023-07-13 ENCOUNTER — Ambulatory Visit: Admit: 2023-07-13 | Discharge: 2023-07-14 | Payer: MEDICARE

## 2023-07-13 MED ORDER — ERYTHROMYCIN 5 MG/GRAM (0.5 %) EYE OINTMENT
Freq: Every evening | OPHTHALMIC | 3 refills | 0 days | Status: CP
Start: 2023-07-13 — End: 2023-10-11

## 2023-07-13 MED ORDER — TIMOLOL MALEATE 0.25 % EYE DROPS
Freq: Every day | OPHTHALMIC | 5 refills | 0 days | Status: CP
Start: 2023-07-13 — End: 2024-07-12

## 2023-07-13 NOTE — Unmapped (Signed)
This is an 82 y/o woman with htn and migraines and DM.    Here for crusting of eyes. Noted to have Sup Q thinning on RNFL OCT  Increased Glaucoma Risk.     Pseudophakia since 2017 OU.    Severe Dry Eye noted 07/09/23 and she is referred for evaluation & Tx.     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    PVD (posterior vitreous detachment), bilateral  Monitor    Type 2 diabetes mellitus without complication, without long-term current use of insulin (CMS-HCC)  No BDR    S/P cataract extraction and insertion of intraocular lens, left  S/P cataract extraction and insertion of intraocular lens, right    Meibomian gland dysfunction (MGD), bilateral, both upper and lower lids  -     azithromycin (ZITHROMAX) 250 MG tablet; Take 1 tablet (250 mg total) by mouth 3 (three) times a week.    Other orders  -     spironolactone (ALDACTONE) 50 MG tablet; Take 0.5 tablets (25 mg total) by mouth daily.  -     erythromycin (ROMYCIN) 5 mg/gram (0.5 %) ophthalmic ointment; Administer to both eyes nightly.  -     timolol (TIMOPTIC) 0.25 % ophthalmic solution; Administer 1 drop to both eyes daily.      start timolol qAM for NTG     RTC in 3 months for V/T/SLE    I personally spent 30 minutes face-to-face and non-face-to-face in the care of this patient, which includes all pre, intra, and post visit time on the date of service.    Penelope Galas MD

## 2023-07-14 MED ORDER — AZITHROMYCIN 250 MG TABLET
ORAL_TABLET | ORAL | 0 refills | 93 days | Status: CP
Start: 2023-07-14 — End: 2023-10-12

## 2023-07-16 NOTE — Unmapped (Signed)
Referral request

## 2023-07-20 ENCOUNTER — Ambulatory Visit: Admit: 2023-07-20 | Discharge: 2023-07-21 | Payer: MEDICARE | Attending: Internal Medicine | Primary: Internal Medicine

## 2023-07-20 DIAGNOSIS — E1169 Type 2 diabetes mellitus with other specified complication: Principal | ICD-10-CM

## 2023-07-20 DIAGNOSIS — I10 Essential (primary) hypertension: Principal | ICD-10-CM

## 2023-07-20 DIAGNOSIS — E785 Hyperlipidemia, unspecified: Principal | ICD-10-CM

## 2023-07-20 DIAGNOSIS — K219 Gastro-esophageal reflux disease without esophagitis: Principal | ICD-10-CM

## 2023-07-20 LAB — HEMOGLOBIN A1C
ESTIMATED AVERAGE GLUCOSE: 117 mg/dL
HEMOGLOBIN A1C: 5.7 % — ABNORMAL HIGH (ref 4.8–5.6)

## 2023-07-20 MED ORDER — OMEPRAZOLE 20 MG CAPSULE,DELAYED RELEASE
ORAL_CAPSULE | Freq: Every day | ORAL | 0 refills | 90 days | Status: CP
Start: 2023-07-20 — End: 2024-07-19

## 2023-07-20 NOTE — Unmapped (Signed)
Assessment and Plan      1. Gastroesophageal reflux disease, unspecified whether esophagitis present    2. Type 2 diabetes mellitus with hyperlipidemia (CMS-HCC)    3. Benign essential hypertension        Referral per orders.  Medications per orders.  Indications, pharmacology, and potential adverse effects (to include significant interactions) were reviewed with the patient.   Follow up as scheduled with appropriate provider or sooner prn.    Detailed vaccine counseling and review. Labs per orders. Follow up pending results.    No change in antihypertensive(s) therapy.  Encouraged regular adherence.  Follow up as scheduled with appropriate provider or sooner prn.      No problem-specific Assessment & Plan notes found for this encounter.      Orders Placed This Encounter   Procedures    Hemoglobin A1c    Gastroenterology       Requested Prescriptions     Signed Prescriptions Disp Refills    omeprazole (PRILOSEC) 20 MG capsule 90 capsule 0     Sig: Take 1 capsule (20 mg total) by mouth daily.       There are no discontinued medications.    No follow-ups on file.    Signs and symptoms that should prompt return sooner than scheduled were reviewed with the patient.      [facility] Components:     Medication adherence and barriers to the treatment plan have been addressed. Opportunities to optimize healthy behaviors have been discussed. Patient / caregiver voiced understanding.        HPI      Chief Complaint    Chief Complaint   Patient presents with    Follow-up       Kayla Knight presents for follow up of the following concern(s):    Has had dyspepsia.  Epigastric discomfort and felt imminent need to vomit.  Doesn't matter what she eats.  Normal bowel movements without blood.  Alcohol:  none.  Caffeine:  occasional.  No dysphagia or odynophagia or weight loss are noted.      She has not taken antihypertensive(s) today.  (S)He denies adverse effects from medication. .  Not exercising as regularly as before.        Health Maintenance reviewed - Detailed vaccine counseling and review.        I have reviewed the patient's medical history in detail and updated the computerized patient record.        Patient Active Problem List   Diagnosis    Benign essential hypertension    Primary osteoarthritis of right knee    Idiopathic peripheral neuropathy    MR (mitral regurgitation)    Type 2 diabetes mellitus without complication, without long-term current use of insulin (CMS-HCC)    Neck pain    Encounter for subsequent annual wellness visit (AWV) in Medicare patient    Lipoma of torso    Skin lesion    Hearing loss    Lichen sclerosus et atrophicus of the vulva    Renal artery stenosis, native, bilateral (CMS-HCC)    Hyperlipidemia, unspecified    Aftercare following joint replacement    Presence of left artificial knee joint    Hypokalemia    History of chest pain    Decreased GFR    Nonrheumatic aortic valve insufficiency    Endometrial thickening on ultrasound    Weakness of both hips    Right hip pain    Hot flashes    Aortic valve disease  Post-nasal drip    Witnessed episode of apnea    Glaucoma suspect of both eyes    Dry eye syndrome, bilateral    PVD (posterior vitreous detachment), bilateral    S/P cataract extraction and insertion of intraocular lens, left    S/P cataract extraction and insertion of intraocular lens, right    Meibomian gland dysfunction (MGD), bilateral, both upper and lower lids         Allergies:  Penicillins, Lisinopril, Statins-hmg-coa reductase inhibitors, and Sulfa (sulfonamide antibiotics)    Medications:   Outpatient Medications Prior to Visit   Medication Sig Dispense Refill    amlodipine (NORVASC) 10 MG tablet Take 1 tablet (10 mg total) by mouth daily. 90 tablet 3    azithromycin (ZITHROMAX) 250 MG tablet Take 1 tablet (250 mg total) by mouth 3 (three) times a week. 40 tablet 0    clopidogrel (PLAVIX) 75 mg tablet Take 1 tablet (75 mg total) by mouth daily.      empty container (SHARPS-A-GATOR DISPOSAL SYSTEM) Misc Use as directed for sharps disposal 1 each 2    erythromycin (ROMYCIN) 5 mg/gram (0.5 %) ophthalmic ointment Administer to both eyes nightly. 3.5 g 3    evolocumab (REPATHA SURECLICK) 140 mg/mL PnIj Inject the contents of 1 pen (140 mg) under the skin every fourteen (14) days. 6 mL 3    spironolactone (ALDACTONE) 50 MG tablet Take 0.5 tablets (25 mg total) by mouth daily.      timolol (TIMOPTIC) 0.25 % ophthalmic solution Administer 1 drop to both eyes daily. 5 mL 5     No facility-administered medications prior to visit.       Medical History:  Past Medical History:   Diagnosis Date    Anemia     past history, over 40 yrs ago    Cataract 2008    Surgery    Diabetes mellitus (CMS-HCC)     Type II, on 9/15 patient stated she was not longer diabetic, not taking meds    Dry eye     Hearing impairment     Heart murmur 2016    [facility] Verdie [facility]ire [facility]    Heart valve disease     Heart mumur, pt reports diagnosis 10 yrs ago (05/10/20)    History of stent insertion of renal artery 01/29/2020    ASA indefinitely and plavix x 6 months per procedure note    Hypertension     pt reports taking meds since about 1995    Neuromuscular disorder (CMS-HCC) 2014    Feet & Legs    Tear of meniscus of knee     4.5 yrs ago    Urinary incontinence     seen a doctor about nighttime bathroom use (adressed in 2019)       Surgical History:  Past Surgical History:   Procedure Laterality Date    CATARACT EXTRACTION W/ INTRAOCULAR LENS IMPLANT Left 02/27/2016    EYE SURGERY  01/30/16    Cataract surgery is scheduled.    JOINT REPLACEMENT  Knee    KNEE CARTILAGE SURGERY Right 2007, 2008    x 2    KNEE SURGERY      pt reports total left knee joint replacement surgery around 2016, 2017    PLANTAR FASCIECTOMY Left     PR REVASCULARIZE FEM/POP ARTERY,ANGIOPLASTY/STENT N/A 01/29/2020    Procedure: Peripheral Angiography W Intervetion;  Surgeon: Alvira Philips, MD;  Location: [facility] CATH;  Service: Cardiology PR REVASCULARIZE FEM/POP  ARTERY,ANGIOPLASTY/STENT Right 12/26/2021    Procedure: right renal artery intervention;  Surgeon: Alvira Philips, MD;  Location: [facility] CATH;  Service: Cardiology    PR UPPER GI ENDOSCOPY,BIOPSY N/A 04/01/2021    Procedure: UGI ENDOSCOPY; WITH BIOPSY, SINGLE OR MULTIPLE;  Surgeon: Jarvis Morgan, MD;  Location: [facility] GI PROCEDURES [facility];  Service: Gastroenterology    PR XCAPSL CTRC RMVL INSJ IO LENS PROSTH W/O ECP Right 02/13/2016    Procedure: EXTRACAPSULAR CATARACT REMOVAL W/INSERTION OF INTRAOCULAR LENS PROSTHESIS, MANUAL OR MECHANICAL TECHNIQUE;  Surgeon: Garey Ham, MD;  [facility];  Service: Ophthalmology    PR XCAPSL CTRC RMVL INSJ IO LENS PROSTH W/O ECP Left 02/27/2016    Procedure: EXTRACAPSULAR CATARACT REMOVAL W/INSERTION OF INTRAOCULAR LENS PROSTHESIS, MANUAL OR MECHANICAL TECHNIQUE;  Surgeon: Garey Ham, MD;  Location: [facility];  Service: Ophthalmology    RENAL ARTERY STENT  01/11/2020    ROOT CANAL      TUBAL LIGATION  1973    [facility]    WISDOM TOOTH EXTRACTION         Social History:  Tobacco use:   reports that she quit smoking about 39 years ago. Her smoking use included cigarettes. She has been exposed to tobacco smoke. She has never used smokeless tobacco.  Alcohol use:   reports current alcohol use.  Drug use:  reports no history of drug use.      Family History:  Family History   Problem Relation Age of Onset    Heart disease Mother     Hypertension Mother     Dementia Mother     Arthritis Mother     Heart disease Father     Thyroid disease Father     Glaucoma Father     Alcohol abuse Father     Heart attack Father 47    Arthritis Father     Hypertension Father         Massive heart attack    Arthritis Sister     Glaucoma Sister     Hypertension Sister     Diabetes Sister     Diabetes Daughter     Diabetes Paternal Aunt     No Known Problems Brother     No Known Problems Maternal Aunt     No Known Problems Maternal Uncle     No Known Problems Paternal Uncle     No Known Problems Maternal Grandmother     No Known Problems Maternal Grandfather     No Known Problems Paternal Grandmother     No Known Problems Paternal Grandfather     Arthritis Sister         Noticeable in extremities    COPD Sister     Depression Sister     Hypertension Sister     Glaucoma Sister     Asthma Daughter         Ocassional    Hypertension Daughter     Miscarriages / Stillbirths Daughter     Cancer Maternal Aunt         Died more than 42yrs ago    Early death Maternal Aunt     Diabetes Paternal Aunt         4 Aunts    Kidney disease Paternal Aunt     Amblyopia Neg Hx     Blindness Neg Hx     Cancer Neg Hx     Cataracts Neg Hx  Macular degeneration Neg Hx     Retinal detachment Neg Hx     Strabismus Neg Hx     Stroke Neg Hx            Review of Systems:    Pertinent review of systems is noted in the HPI.           Physical Exam      BP 134/62 (BP Site: L Arm, BP Position: Sitting, BP Cuff Size: Large)  - Pulse 89  - Temp 37.7 ??C (99.8 ??F) (Oral)  - Ht 165 cm (5' 4.96)  - Wt 73.8 kg (162 lb 12.8 oz)  - SpO2 95%  - BMI 27.12 kg/m??       BP Readings from Last 3 Encounters:   07/20/23 134/62   05/17/23 142/67   05/14/23 130/74       Wt Readings from Last 3 Encounters:   07/20/23 73.8 kg (162 lb 12.8 oz)   05/17/23 73 kg (161 lb)   05/14/23 73 kg (161 lb)       She appears well, in no apparent distress.  Alert and oriented times three, pleasant and cooperative. Vital signs are as documented in vital signs section. Mouth and throat are normal. Cor is regular rate and rhythm with 2/6 systolic murmur at RUSB.  Lungs are clear throughout with normal respiratory effort.  No lower extremity edema

## 2023-07-21 ENCOUNTER — Ambulatory Visit: Admit: 2023-07-21 | Discharge: 2023-07-22 | Payer: MEDICARE

## 2023-07-21 DIAGNOSIS — J101 Influenza due to other identified influenza virus with other respiratory manifestations: Principal | ICD-10-CM

## 2023-07-21 DIAGNOSIS — M12812 Other specific arthropathies, not elsewhere classified, left shoulder: Principal | ICD-10-CM

## 2023-07-21 MED ORDER — OSELTAMIVIR 75 MG CAPSULE
ORAL_CAPSULE | Freq: Two times a day (BID) | ORAL | 0 refills | 5 days | Status: CP
Start: 2023-07-21 — End: 2023-07-26

## 2023-07-21 NOTE — Unmapped (Signed)
[facility] CLINIC NOTE    ASSESSMENT/PLAN:    Problem List Items Addressed This Visit    None  Visit Diagnoses       Influenza B    -  Primary    Relevant Medications    oseltamivir (TAMIFLU) 75 MG capsule    Rotator cuff arthropathy of left shoulder                 1. Influenza B  - POCT Rapid Influenza B test was positive today in clinic.   - Start Tamiflu 75 mg twice a day for 5 days for the flu symptoms.       2. Rotator cuff impingement  - Provided rotator cuff exercises in AVS and advised her to do them daily to help the arm pain.  - The patient is in agreement with the plan of care.    Return to clinic if symptoms worsen or do not improve.  Seek Emergency Care if you have difficulty breathing.     Chief Complaint   Patient presents with    Arm Pain     Left arm x2 days     Fatigue    Cough     X3 days         SUBJECTIVE:    Kayla Knight is a 82 y.o. female that presents to Urgent Care  today regarding the following issues:    # Productive cough    The patient presents to clinic today with a cough for 3-4 days. The cough is productive with yellow phlegm. Endorses generalized weakness, fatigue, and nasal congestion (in the mornings). She had Covid-19 on 05/16/23 and is wondering if her symptoms could be lingering. Her neighbor who she sees often is not sick with similar symptoms. No ill contacts, recent travel, or exposure to children. She thinks she has sleep apnea. Takes azithromycin 3 times a week for her eye issues. She is a former smoker who quit over 30 years ago. No fever or chills. No shortness of breath or difficulty breathing.      # Left arm pain    She reports constant left upper arm pain for 2 days. She states that the pain feels similar to when you get a vaccine (she has not received a vaccine recently). Reports that the pain is hindering her from doing things that she normally does, which is really decreasing her motivation to work..  No injury mechanism noted. She has not been taking anything for her symptoms.     I have reviewed the patients problem list, medical history, surgical history, laboratory history and recent hospitalizations, current medications, allergies, and social history and updated them as needed.    Review of symptoms:  Negative unless  Otherwise stated in HPI.     [name]  reports that she quit smoking about 39 years ago. Her smoking use included cigarettes. She has been exposed to tobacco smoke. She has never used smokeless tobacco.    OBJECTIVE:    VITALS:   Vitals:    07/21/23 1117   BP: 159/73   Pulse: 77   Temp: 36.6 ??C (97.8 ??F)   SpO2: 96%    Wt:   Wt Readings from Last 3 Encounters:   07/21/23 72.6 kg (160 lb)   07/20/23 73.8 kg (162 lb 12.8 oz)   05/17/23 73 kg (161 lb)       Gen: Pleasant and cooperative in NAD, resting comfortably,  appears stated age  Head: Normocephalic, atraumatic  EENT: Sclera clear, bilateral nasal passages without obstruction, no drainage, TMs with normal landmarks and without effusion, no injection, MMM,  no cervical lymphadenopathy  CV: Normal S1 S2 no m/r/g, RRR. Radial pulse 2+ bilaterally  Resp: Clear to ascultation bilaterally without adventitious sounds. No increased work of breathing  Msk: Tenderness to palpation at the left lateral upper arm. Pain in left upper arm with internal rotation. No UE weakness.   Neuro:  A&O x 4,  normal gait  Skin: Warm and dry,   Psych:  Mood is good, able to carry on normal conversation, with good eye contact, without evidence of anxiety or depression    LABS:  Results for orders placed or performed in visit on 07/21/23   POCT Influenza A/B   Result Value Ref Range    Influenza A Negative Negative    Influenza B Positive (A) Negative       STUDIES:  No results found.    ___________________________________  CURRENT MEDS:  Current Outpatient Medications   Medication Sig Dispense Refill    amlodipine (NORVASC) 10 MG tablet Take 1 tablet (10 mg total) by mouth daily. 90 tablet 3    azithromycin (ZITHROMAX) 250 MG tablet Take 1 tablet (250 mg total) by mouth 3 (three) times a week. 40 tablet 0    clopidogrel (PLAVIX) 75 mg tablet Take 1 tablet (75 mg total) by mouth daily.      empty container (SHARPS-A-GATOR DISPOSAL SYSTEM) Misc Use as directed for sharps disposal 1 each 2    erythromycin (ROMYCIN) 5 mg/gram (0.5 %) ophthalmic ointment Administer to both eyes nightly. 3.5 g 3    evolocumab (REPATHA SURECLICK) 140 mg/mL PnIj Inject the contents of 1 pen (140 mg) under the skin every fourteen (14) days. 6 mL 3    omeprazole (PRILOSEC) 20 MG capsule Take 1 capsule (20 mg total) by mouth daily. 90 capsule 0    spironolactone (ALDACTONE) 50 MG tablet Take 0.5 tablets (25 mg total) by mouth daily.      timolol (TIMOPTIC) 0.25 % ophthalmic solution Administer 1 drop to both eyes daily. 5 mL 5    oseltamivir (TAMIFLU) 75 MG capsule Take 1 capsule (75 mg total) by mouth two (2) times a day for 5 days. 10 capsule 0     No current facility-administered medications for this visit.       ___________________________________  ALLERGIES:  Allergies   Allergen Reactions    Penicillins Itching and Swelling    Lisinopril Cough    Statins-Hmg-Coa Reductase Inhibitors      Ineffective at controlling cholesterol when tried in the past.    Sulfa (Sulfonamide Antibiotics) Itching     ------------------------    PAST MEDICAL HISTORY:   Past Medical History:   Diagnosis Date    Anemia     past history, over 40 yrs ago    Cataract 2008    Surgery    Diabetes mellitus (CMS-HCC)     Type II, on 9/15 patient stated she was not longer diabetic, not taking meds    Dry eye     Hearing impairment     Heart murmur 2016    [facility]. 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  yes    Follow-up with PCP        [facility]  [address], [place], [place] [address]  Telephone [phone]  Fax [phone]  [email or web address]      Scribe's Attestation: Corene Cornea, FNP obtained and performed the history, physical exam and medical decision making elements that were entered into the chart.  Signed by Rubin Payor, Scribe, on July 21, 2023 at 12:05 PM.      The documentation recorded by the scribe accurately reflects the service I personally performed and the decisions made by me. Starleen Blue, FNP

## 2023-07-21 NOTE — Unmapped (Addendum)
It was a pleasure seeing you today.  Please do the exercises daily to help your arm pain.  Return to clinic if symptoms worsen or do not improve.  Seek Emergency Care if you have difficulty breathing.

## 2023-07-26 DIAGNOSIS — G4733 Obstructive sleep apnea (adult) (pediatric): Secondary | ICD-10-CM | POA: Diagnosis not present

## 2023-07-29 ENCOUNTER — Ambulatory Visit: Admit: 2023-07-29 | Discharge: 2023-07-30 | Payer: MEDICARE | Attending: Internal Medicine | Primary: Internal Medicine

## 2023-07-29 DIAGNOSIS — R194 Change in bowel habit: Principal | ICD-10-CM

## 2023-07-29 DIAGNOSIS — R1013 Epigastric pain: Principal | ICD-10-CM

## 2023-07-29 DIAGNOSIS — K219 Gastro-esophageal reflux disease without esophagitis: Principal | ICD-10-CM

## 2023-07-29 DIAGNOSIS — R1084 Generalized abdominal pain: Principal | ICD-10-CM

## 2023-07-29 LAB — COMPREHENSIVE METABOLIC PANEL
ALBUMIN/GLOBULIN RATIO: 0.7 — ABNORMAL LOW (ref 1.1–2.2)
ALBUMIN: 3.4 g/dL — ABNORMAL LOW (ref 3.5–5.0)
ALKALINE PHOSPHATASE: 72 U/L (ref 50–136)
ALT (SGPT): 17 U/L (ref 12–78)
ANION GAP: 11 mmol/L
AST (SGOT): 19 U/L (ref 7–31)
BILIRUBIN TOTAL: 0.3 mg/dL (ref 0.0–1.5)
BLOOD UREA NITROGEN: 20 mg/dL (ref 5–26)
BUN / CREAT RATIO: 16 (ref 12–25)
CALCIUM: 10.1 mg/dL (ref 8.5–10.5)
CHLORIDE: 103 mmol/L (ref 95–110)
CO2: 25 mmol/L (ref 21.0–31.0)
CREATININE: 1.26 mg/dL (ref 0.50–1.50)
EGFR CKD-EPI (2021) FEMALE: 43 mL/min/{1.73_m2} — ABNORMAL LOW (ref >=60)
GLOBULIN, TOTAL: 4.7 g/dL — ABNORMAL HIGH (ref 1.5–4.3)
GLUCOSE RANDOM: 95 mg/dL (ref 60–100)
POTASSIUM: 4.3 mmol/L (ref 3.5–5.1)
PROTEIN TOTAL: 8.1 g/dL (ref 6.4–8.3)
SODIUM: 139 mmol/L (ref 136–145)

## 2023-07-29 LAB — CBC W/ AUTO DIFF
HEMATOCRIT: 39.8 % (ref 35.0–45.0)
HEMOGLOBIN: 12.8 g/dL (ref 11.3–15.5)
LYMPHOCYTES ABSOLUTE COUNT: 2.4 10*9/L (ref 1.0–4.8)
LYMPHOCYTES RELATIVE PERCENT: 33.1 %
MEAN CORPUSCULAR HEMOGLOBIN CONC: 32.3 g/dL (ref 31.0–36.0)
MEAN CORPUSCULAR HEMOGLOBIN: 25.2 pg — ABNORMAL LOW (ref 26.0–32.0)
MEAN CORPUSCULAR VOLUME: 78 fL — ABNORMAL LOW (ref 89.0–97.0)
MEAN PLATELET VOLUME: 5.8 fL — ABNORMAL LOW (ref 6.5–8.9)
MONOCYTES ABSOLUTE COUNT: 0.5 10*9/L (ref 0.0–0.8)
MONOCYTES RELATIVE PERCENT: 7.5 %
NEUTROPHILS ABSOLUTE COUNT: 4.5 10*9/L (ref 1.8–7.0)
NEUTROPHILS RELATIVE PERCENT: 59.4 %
PLATELET COUNT: 445 10*9/L — ABNORMAL HIGH (ref 140–440)
RED BLOOD CELL COUNT: 5.09 10*12/L (ref 3.80–5.10)
RED CELL DISTRIBUTION WIDTH: 15.2 % — ABNORMAL HIGH (ref 11.0–15.0)
WBC ADJUSTED: 7.4 10*9/L (ref 3.8–10.8)

## 2023-07-29 LAB — LIPASE: LIPASE: 49 U/L (ref 12–53)

## 2023-07-29 MED ORDER — OMEPRAZOLE 20 MG CAPSULE,DELAYED RELEASE
ORAL_CAPSULE | Freq: Two times a day (BID) | ORAL | 2 refills | 90 days | Status: CP
Start: 2023-07-29 — End: 2023-10-27

## 2023-07-29 MED ORDER — FAMOTIDINE 20 MG TABLET
ORAL_TABLET | Freq: Two times a day (BID) | ORAL | 3 refills | 90 days | Status: CP
Start: 2023-07-29 — End: 2024-07-28

## 2023-07-30 NOTE — Unmapped (Signed)
Assessment and Plan  1. Gastroesophageal reflux disease, unspecified whether esophagitis present -ok for daily ppi,bid if needed. Ok if needed additional famotidine 1-2x/d. Review of prev egd. EGD if med changes do not relieve  Possible contribution of recent viral illnesses to sx.    2. Dyspepsia    3. Bowel habit changes    4. Generalized abdominal pain -labs ad abdo and pelvic u/s     Orders Placed This Encounter   Procedures    [place] ABD COMP -LIVER PANC GB BILE DUCT AORTA PROX IVC KIDNEY SPLN    [place] Endovaginal (Non-OB)    Comprehensive metabolic panel    Lipase Level    CBC w/ Differential         Signs and symptoms that should prompt return sooner than scheduled were reviewed with the patient.  Visit time: In excess of  30  minutes, more than 50% of which was spent face-to-face in counseling and education activities regarding the diagnoses above.     HPI    Chief Complaint  Chief Complaint   Patient presents with    Gastroesophageal Reflux     Worsening reflux during day and night      Kayla Knight is here for problem w gerd. Covid 8/24. Has been fatigued and not feeling right since.Flu 07/21/23. Episode after eating fish soup w regurgitation of ingested food when laid down to sleep.  Homemade pound cake can cause reflux. No dysphagia. PPI has been helpful. Mid day w mid abdo muscular discomfort. Lays down to resolve. BM more than nml  7/22 EGD re dyspepsia gerd,bloating-Mild esophagitis,small hiatal hernia,mild antral gastritis. Biopsies mild irritation at edge of esophagus,unremarkabl stomach and duodenum,no H pylori or Celiac sprue.     Allergies:  Penicillins, Lisinopril, Statins-hmg-coa reductase inhibitors, and Sulfa (sulfonamide antibiotics)    Medications:   Outpatient Medications Prior to Visit   Medication Sig Dispense Refill    amlodipine (NORVASC) 10 MG tablet Take 1 tablet (10 mg total) by mouth daily. 90 tablet 3    azithromycin (ZITHROMAX) 250 MG tablet Take 1 tablet (250 mg total) by mouth 3 (three) times a week. 40 tablet 0    clopidogrel (PLAVIX) 75 mg tablet Take 1 tablet (75 mg total) by mouth daily.      empty container (SHARPS-A-GATOR DISPOSAL SYSTEM) Misc Use as directed for sharps disposal 1 each 2    erythromycin (ROMYCIN) 5 mg/gram (0.5 %) ophthalmic ointment Administer to both eyes nightly. 3.5 g 3    evolocumab (REPATHA SURECLICK) 140 mg/mL PnIj Inject the contents of 1 pen (140 mg) under the skin every fourteen (14) days. 6 mL 3    mag hydrox/aluminum hyd/simeth (ANTACID ANTI-GAS ORAL) Take by mouth.      omeprazole (PRILOSEC) 20 MG capsule Take 1 capsule (20 mg total) by mouth daily. 90 capsule 0    spironolactone (ALDACTONE) 50 MG tablet Take 0.5 tablets (25 mg total) by mouth daily.      timolol (TIMOPTIC) 0.25 % ophthalmic solution Administer 1 drop to both eyes daily. 5 mL 5     No facility-administered medications prior to visit.       Medical History:  Past Medical History:   Diagnosis Date    Anemia     past history, over 40 yrs ago    Cataract 2008    Surgery    Diabetes mellitus (CMS-HCC)     Type II, on 9/15 patient stated she was not longer diabetic, not taking meds  Dry eye     Hearing impairment     Heart murmur 2016    Dr. 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    JOINT REPLACEMENT  Knee    KNEE CARTILAGE SURGERY Right 2007, 2008    x 2    KNEE SURGERY      pt reports total left knee joint replacement surgery around 2016, 2017    PLANTAR FASCIECTOMY Left     PR REVASCULARIZE FEM/POP ARTERY,ANGIOPLASTY/STENT N/A 01/29/2020    Procedure: Peripheral Angiography W Intervetion;  Surgeon: Alvira Philips, MD;  Location: [facility] CATH;  Service: Cardiology    PR REVASCULARIZE FEM/POP ARTERY,ANGIOPLASTY/STENT Right 12/26/2021    Procedure: right renal artery intervention;  Surgeon: Alvira Philips, MD;  Location: [facility] CATH;  Service: Cardiology    PR UPPER GI ENDOSCOPY,BIOPSY N/A 04/01/2021    Procedure: UGI ENDOSCOPY; WITH BIOPSY, SINGLE OR MULTIPLE;  Surgeon: Jarvis Morgan, MD;  Location: [facility] PROCEDURES [facility];  Service: Gastroenterology    PR XCAPSL CTRC RMVL INSJ IO LENS PROSTH W/O ECP Right 02/13/2016    Procedure: EXTRACAPSULAR CATARACT REMOVAL W/INSERTION OF INTRAOCULAR LENS PROSTHESIS, MANUAL OR MECHANICAL TECHNIQUE;  Surgeon: Garey Ham, MD;  Location: [facility];  Service: Ophthalmology    PR XCAPSL CTRC RMVL INSJ IO LENS PROSTH W/O ECP Left 02/27/2016    Procedure: EXTRACAPSULAR CATARACT REMOVAL W/INSERTION OF INTRAOCULAR LENS PROSTHESIS, MANUAL OR MECHANICAL TECHNIQUE;  Surgeon: Garey Ham, MD;  Location: [facility];  Service: Ophthalmology    RENAL ARTERY STENT  01/11/2020    ROOT CANAL      TUBAL LIGATION  1973    [facility]    WISDOM TOOTH EXTRACTION         Social History:  Tobacco use:   reports that she quit smoking about 39 years ago. Her smoking use included cigarettes. She has been exposed to tobacco smoke. She has never used smokeless tobacco.  Alcohol use:   reports current alcohol use.  Drug use:  reports no history of drug use.    Review of Systems:  ROS:   General ROS: negative for - fever,weight loss  HEENT ROS: negative for sore throat, or rhinitis  Respiratory ROS: no cough, shortness of breath, or wheezing  Cardiovascular ROS: no chest pain or dyspnea on exertion  Gastrointestinal ROS: no abdominal pain, change in bowel habits, or black or bloody stools  Genito-Urinary ROS: no dysuria or hematuria  Musculoskeletal ROS: negative for - new joint pain, muscle pain or muscular weakness   Dermatological ROS: negative for rash or new lesions  Neuro:no headaches  Vitals:    07/29/23 0800   BP: 140/70   BP Site: L Arm   BP Position: Sitting   BP Cuff Size: Small   Pulse: 65   SpO2: 97%   Weight: 72.9 kg (160 lb 12.8 oz)       Physical Exam  General: alert, oriented, no distress  EYES: Anicteric sclerae.  NECK: no lymphadenopathy,   RESP: Relaxed respiratory effort. Clear to auscultation without wheezes or crackles.    CV: Regular rate and rhythm. Normal S1 and S2. No murmurs or gallops.  Marland Kitchen   ABDO:soft,nontender,no hsm,no guarding or rebound,no distension  EXT:  No peripheral edema

## 2023-08-09 ENCOUNTER — Ambulatory Visit: Admit: 2023-08-09 | Discharge: 2023-08-10 | Payer: MEDICARE

## 2023-08-09 DIAGNOSIS — I340 Nonrheumatic mitral (valve) insufficiency: Principal | ICD-10-CM

## 2023-08-09 DIAGNOSIS — I1A0 Resistant hypertension: Principal | ICD-10-CM

## 2023-08-09 DIAGNOSIS — I701 Atherosclerosis of renal artery: Principal | ICD-10-CM

## 2023-08-09 DIAGNOSIS — E782 Mixed hyperlipidemia: Principal | ICD-10-CM

## 2023-08-09 DIAGNOSIS — I10 Essential (primary) hypertension: Principal | ICD-10-CM

## 2023-08-09 DIAGNOSIS — R079 Chest pain, unspecified: Principal | ICD-10-CM

## 2023-08-09 DIAGNOSIS — I351 Nonrheumatic aortic (valve) insufficiency: Principal | ICD-10-CM

## 2023-08-09 NOTE — Unmapped (Signed)
Attending Physician Attestation:      I confirm that I interviewed and examined the patient, reviewed all the relevant lab and diagnostic data, and we agreed upon a plan of treatment. I agree with the findings, assessment, and plan as documented by Dr. Ladona Ridgel.    Kennis Carina, MD

## 2023-08-09 NOTE — Unmapped (Signed)
DIVISION OF CARDIOLOGY  [facility] [place]        Date of Service: 08/09/2023      PCP: Referring Provider:   Virgina Evener, MD  [address] [facility] Internal Medicine  [place] [address]  Phone: [phone]  Fax: [phone] Powell-Tillman, Valetta Close, MD  [address] [facility] Internal Medicine  [place],  [place] [address]  Phone: [phone]  Fax: [phone]     ASSESSMENT & PLAN:   Kayla Knight is a 82 y.o. female w/PMHx of HTN, HLD, Endometriosis and DM2 who presents for routine follow up.    Hypertension- uncontrolled/ non-compliance  Resistant HTN  History of renal artery stenosis s/p stenting to the left renal artery (2021) and right renal artery (2023)  Blood pressures are poorly controlled. Previously, she has been seeing BPs of 150-160s and is now seeing BPs at 190s while she has been off losartan/hydrochlorothiazide. She stopped taking losartan/hydrochlorothiazide due to concerns that it was not working- BPs were not controlled on 3 drug regimen suggesting resistant HTN. Suspect she may have some degree of secondary HTN. BP in clinic today 14/73. Home BPs: 140s-150s systolic. Recently tested for sleep apnea with home assessment, noted to have severe sleep apnea and scheduled for in-person assessment 08/19/2023.  Plan:  - Continue amlodipine 10mg  daily, continue spironolactone 25mg  with garlic. If BPs worsen with decrease to 25mg , would increase back to 50mg .  - Continue Plavix 75 mg daily, asa 81 - can transition to monotherapy on follow up visit  - Sleep study to evaluate for secondary causes of HTN; scheduled for 08/19/2023    Hyperlipidemia  Lab Results   Component Value Date    LDL 82 01/11/2023   Well controlled on Repatha.    Valvular heart disease  She was noted to have moderate aortic regurgitation and mild mitral regurgitation on last echocardiogram.  Plan:  - 12/2021 TTE showing mild to mod AI and mild MR; stable from prior. Continue with q51yr follow up.    Chest pain  Was seen in [facility] ED on 06/21/2022 for chest pain with reassuring workup, referred to Chest pain transitions clinic and was referred for NM stress. The NM stress (06/2022) was normal with no evidence of ischemia or scar. Coronary calcifications were noted.  - No interventions at this time, continue to monitor and treat hyperlipidemia as above.    Type II DM  Lab Results   Component Value Date    A1C 5.7 (H) 07/20/2023   Not on therapies. Diet controlled.          Return in about 1 year (around 08/08/2024).      SUBJECTIVE:        Reason for Consultation: HTN    History of Present Illness: Kayla Knight is a 82 y.o. female w/PMHx of uncontrolled hypertension, history of renal artery stenosis status post stenting to the left renal artery, hyperlipidemia, type 2 diabetes and medication nonadherence.  She comes in today for routine follow-up.    Kayla Knight has a history of longstanding hypertension previously well controlled on maxzide, but requiring increasing medications in 2020-2021. Additionally had admission for HTN urgency in 10/2019.  Renal artery duplex 01-15-20 revealed bilateral renal artery stenosis, for which he underwent left renal artery stenting on 01-29-20; angiography showed a 60% right renal artery stenosis so no intervention was completed.      She was concerned regarding pain in her  flank and was referred for renal artery duplex that did show potential progression in stenosis. She was sent for cath and had stenting of the inferior branch of the right renal artery with good result. She did not see any change in her symptoms following this and has been seeing blood pressures of 190s frequently. She self-discontinued losartan-hydrochlorothiazide and has been resistant in the past to further titration.    Was seen in [facility] ED on 06/21/2022 for chest pain with reassuring workup, referred to Chest pain transitions clinic and was referred for NM stress. The NM stress (06/2022) was normal with no evidence of ischemia or scar. Coronary calcifications were noted.        She denies complaints of chest pain, dyspnea, exertional dyspnea, fatigue, palpitations, orthopnea, PND.        Interval history:  She reports that after starting spironolactone, she did not see improvement in her blood pressure until she started taking garlic. Currently she is taking it once daily, but would like to increase to twice daily.   She has continued to have flank discomfort and ultimately had pelvic US which demonstrates increased endometrial stripe. She has a history of endometriosis and is planned for endometrial biopsy. She had prior CT that did not show any abnormality in the flank region or evidence of endometrial invasion.      Cardiovascular History:  Mitral regurgitation and moderate aortic regurgitation  Hypertension (20 years)  History of anemia in her 69s    I have independently reviewed of all the diagnostic studies. I have reviewed the old medical records from [facility] prior to this clinic visit.     Cardiovascular Studies Date Comments     ECG  NSR with 1st deg AVB, +PACs   Echo 01/05/2022                      10/20/2019   1. The left ventricle is normal in size with normal wall thickness.    2. The left ventricular systolic function is normal, LVEF is visually estimated at > 55%.    3. There is mild mitral valve regurgitation.    4. There is mild to moderate aortic regurgitation.    5. The right ventricle is normal in size, with normal systolic function.       1. The left ventricle is normal in size with mildly to moderately increased wall thickness.    2. The left ventricular systolic function is normal, LVEF is visually estimated at > 55%.    3. There is moderate mitral valve regurgitation.    4. The aortic valve is trileaflet with mildly thickened leaflets with normal excursion.    5. There is mild aortic regurgitation.    6. The left atrium is mildly dilated in size. 7. The right ventricle is normal in size, with normal systolic function.   Stress test 07/13/2022 Non specific T wave abnormalities during stress. No symptoms during stress test. Baseline BP 180/72, peak BP 205/166   Cardiac catheterization     [id number] Genotype     Rhythm Monitoring     Cardiac CT/MRI     Electrophysiology      Cardiovascular Surgery     Peripheral Vascular Studies Renal Angiogram 12/26/2021                                      Renal Angiogram   Patent  stent in the left renal artery with 40% instent restenosis with no pressure gradient across the lesion  Diffuse right renal artery atherosclerotic disease including a 50% stenosis in the main branch of the right renal artery (pressure gradient was 2 - 3 mm Hg mean and 6 mm Hg systolic), a 50% ostial stenosis of the inferior branch of the right renal artery (pressure gradient was 2 mm Hg mean and 5 mm Hg systolic) and a 70% stenosis in the mid portion of the inferior branch of the right renal artery (pressure gradient was 6 mm Hg mean and 13 mm Hg systolic)   Successful PTI of mid-stenosis in the inferior branch of the right renal artery with placement of an Onyx drug eluting stent      Bilateral renal artery stenosis including heavily calcified serial 80% lesions in the left renal artery and 60% right renal artery stenosis at a bifurcation  Successful intervention on left renal artery with placement of an Onyx drug eluting stent     I have personally reviewed the recent imaging studies on this patient.    Past medical history:  Patient Active Problem List   Diagnosis    Benign essential hypertension    Primary osteoarthritis of right knee    Idiopathic peripheral neuropathy    MR (mitral regurgitation)    Type 2 diabetes mellitus without complication, without long-term current use of insulin (CMS-HCC)    Neck pain    Encounter for subsequent annual wellness visit (AWV) in Medicare patient    Lipoma of torso    Skin lesion    Hearing loss    Lichen sclerosus et atrophicus of the vulva    Renal artery stenosis, native, bilateral (CMS-HCC)    Hyperlipidemia, unspecified    Aftercare following joint replacement    Presence of left artificial knee joint    Hypokalemia    History of chest pain    Decreased GFR    Nonrheumatic aortic valve insufficiency    Endometrial thickening on ultrasound    Weakness of both hips    Right hip pain    Hot flashes    Aortic valve disease    Post-nasal drip    Witnessed episode of apnea    Glaucoma suspect of both eyes    Dry eye syndrome, bilateral    PVD (posterior vitreous detachment), bilateral    S/P cataract extraction and insertion of intraocular lens, left    S/P cataract extraction and insertion of intraocular lens, right    Meibomian gland dysfunction (MGD), bilateral, both upper and lower lids       Past surgical history:  Past Surgical History:   Procedure Laterality Date    CATARACT EXTRACTION W/ INTRAOCULAR LENS IMPLANT Left 02/27/2016    EYE SURGERY  01/30/16    Cataract surgery is scheduled.    JOINT REPLACEMENT  Knee    KNEE CARTILAGE SURGERY Right 2007, 2008    x 2    KNEE SURGERY      pt reports total left knee joint replacement surgery around 2016, 2017    PLANTAR FASCIECTOMY Left     PR REVASCULARIZE FEM/POP ARTERY,ANGIOPLASTY/STENT N/A 01/29/2020    Procedure: Peripheral Angiography W Intervetion;  Surgeon: Alvira Philips, MD;  Location: [facility] CATH;  Service: Cardiology    PR REVASCULARIZE FEM/POP ARTERY,ANGIOPLASTY/STENT Right 12/26/2021    Procedure: right renal artery intervention;  Surgeon: Alvira Philips, MD;  Location: [facility] CATH;  Service: Cardiology    PR UPPER  GI ENDOSCOPY,BIOPSY N/A 04/01/2021    Procedure: UGI ENDOSCOPY; WITH BIOPSY, SINGLE OR MULTIPLE;  Surgeon: Jarvis Morgan, MD;  Location: [facility] GI PROCEDURES [facility];  Service: Gastroenterology    PR XCAPSL CTRC RMVL INSJ IO LENS PROSTH W/O ECP Right 02/13/2016    Procedure: EXTRACAPSULAR CATARACT REMOVAL W/INSERTION OF INTRAOCULAR LENS PROSTHESIS, MANUAL OR MECHANICAL TECHNIQUE;  Surgeon: Garey Ham, MD;  Location: [facility];  Service: Ophthalmology    PR XCAPSL CTRC RMVL INSJ IO LENS PROSTH W/O ECP Left 02/27/2016    Procedure: EXTRACAPSULAR CATARACT REMOVAL W/INSERTION OF INTRAOCULAR LENS PROSTHESIS, MANUAL OR MECHANICAL TECHNIQUE;  Surgeon: Garey Ham, MD;  Location: [facility] OR [facility];  Service: Ophthalmology    RENAL ARTERY STENT  01/11/2020    ROOT CANAL      TUBAL LIGATION  1973    [facility]    WISDOM TOOTH EXTRACTION         Medications:   Patient's Medications   New Prescriptions    No medications on file   Previous Medications    AMLODIPINE (NORVASC) 10 MG TABLET    Take 1 tablet (10 mg total) by mouth daily.    AZITHROMYCIN (ZITHROMAX) 250 MG TABLET    Take 1 tablet (250 mg total) by mouth 3 (three) times a week.    CLOPIDOGREL (PLAVIX) 75 MG TABLET    Take 1 tablet (75 mg total) by mouth daily.    EMPTY CONTAINER (SHARPS-A-GATOR DISPOSAL SYSTEM) MISC    Use as directed for sharps disposal    ERYTHROMYCIN (ROMYCIN) 5 MG/GRAM (0.5 %) OPHTHALMIC OINTMENT    Administer to both eyes nightly.    EVOLOCUMAB (REPATHA SURECLICK) 140 MG/ML PNIJ    Inject the contents of 1 pen (140 mg) under the skin every fourteen (14) days.    FAMOTIDINE (PEPCID) 20 MG TABLET    Take 1 tablet (20 mg total) by mouth two (2) times a day.    MAG HYDROX/ALUMINUM HYD/SIMETH (ANTACID ANTI-GAS ORAL)    Take by mouth.    OMEPRAZOLE (PRILOSEC) 20 MG CAPSULE    Take 1 capsule (20 mg total) by mouth two (2) times a day.    SPIRONOLACTONE (ALDACTONE) 50 MG TABLET    Take 0.5 tablets (25 mg total) by mouth daily.    TIMOLOL (TIMOPTIC) 0.25 % OPHTHALMIC SOLUTION    Administer 1 drop to both eyes daily.   Modified Medications    No medications on file   Discontinued Medications    OMEPRAZOLE (PRILOSEC) 20 MG CAPSULE    Take 1 capsule (20 mg total) by mouth daily.       Allergies:  Allergies   Allergen Reactions    Penicillins Itching and Swelling Lisinopril Cough    Statins-Hmg-Coa Reductase Inhibitors      Ineffective at controlling cholesterol when tried in the past.    Sulfa (Sulfonamide Antibiotics) Itching       Social History:  She  reports that she quit smoking about 39 years ago. Her smoking use included cigarettes. She has been exposed to tobacco smoke. She has never used smokeless tobacco. She reports current alcohol use. She reports that she does not use drugs.    Family History:  Her family history includes Alcohol abuse in her father; Arthritis in her father, mother, sister, and sister; Asthma in her daughter; COPD in her sister; Cancer in her maternal aunt; Dementia in her mother; Depression in her sister; Diabetes in her daughter, paternal aunt, paternal aunt, and  sister; Early death in her maternal aunt; Glaucoma in her father, sister, and sister; Heart attack (age of onset: 60) in her father; Heart disease in her father and mother; Hypertension in her daughter, father, mother, sister, and sister; Kidney disease in her paternal aunt; Miscarriages / Stillbirths in her daughter; No Known Problems in her brother, maternal aunt, maternal grandfather, maternal grandmother, maternal uncle, paternal grandfather, paternal grandmother, and paternal uncle; Thyroid disease in her father.    Review of Systems  10 systems were reviewed and negative except as noted in HPI.      OBJECTIVE:       Physical Exam  BP 143/73 (BP Site: L Arm, BP Position: Sitting, BP Cuff Size: Medium)  - Pulse 58  - Resp 18  - Ht 167.6 cm (5' 6)  - Wt 73.5 kg (162 lb)  - SpO2 95%  - BMI 26.15 kg/m??    Wt Readings from Last 3 Encounters:   08/09/23 73.5 kg (162 lb)   07/29/23 72.9 kg (160 lb 12.8 oz)   07/21/23 72.6 kg (160 lb)       General:  Alert, no distress.   Eyes:  Intact, sclerae anicteric.   ENT: Moist mucous membranes. Supple. No obvious thyromegaly.   Respiratory:   CTAB bilaterally with normal WOB.   Cardiovascular:  No carotid bruit, JVD normal at 90 degrees, 3/6 decrescendo systolic murmur heard throughout the precordium, RRR, no rubs or gallops   No edema bilaterally. Pulses full and equal throughout   Gastrointestinal:   nondistented   Musculoskeletal: Normal bulk   Skin: Warm, well perfused.   Neurologic: No focal deficits.       Most recent labs   Lab Results   Component Value Date    Sodium 139 07/29/2023    Sodium 137 04/24/2019    Potassium 4.3 07/29/2023    Potassium 3.7 04/24/2019    Chloride 103 07/29/2023    Chloride 103 04/24/2019    CO2 25.0 07/29/2023    CO2 30.3 04/24/2019    BUN 20 07/29/2023    BUN 12.00 04/24/2019    Creatinine 1.26 07/29/2023    Creatinine 0.8 04/24/2019    Magnesium 2.0 11/13/2019     Lab Results   Component Value Date    HGB 12.8 07/29/2023    HGB 12.7 10/20/2018    MCV 78.0 (L) 07/29/2023    MCV 77 (L) 10/20/2018    Platelet 445 (H) 07/29/2023    Platelet 343 10/20/2018     Lab Results   Component Value Date    Cholesterol 168 01/11/2023    Cholesterol, Total 248 (H) 10/20/2018    Triglycerides 115 01/11/2023    Triglycerides 121 10/20/2018    HDL 63 (H) 01/11/2023    HDL 49 10/20/2018    Non-HDL Cholesterol 105 01/11/2023    LDL calculated 174.8 (H) 10/20/2018    LDL Calculated 82 01/11/2023    Hemoglobin A1C 5.7 (H) 07/20/2023    Hemoglobin A1C 6.0 04/24/2019    TSH 1.287 08/07/2022    TSH 0.88 10/06/2016    PRO-BNP 88.2 02/17/2020    INR 0.91 08/13/2018

## 2023-08-19 ENCOUNTER — Ambulatory Visit: Admit: 2023-08-19 | Discharge: 2023-08-19 | Payer: MEDICARE

## 2023-08-20 ENCOUNTER — Telehealth: Payer: Self-pay | Admitting: Primary Care

## 2023-08-20 ENCOUNTER — Telehealth (INDEPENDENT_AMBULATORY_CARE_PROVIDER_SITE_OTHER): Payer: Medicare Other | Admitting: Primary Care

## 2023-08-20 DIAGNOSIS — G4730 Sleep apnea, unspecified: Secondary | ICD-10-CM | POA: Diagnosis not present

## 2023-08-20 NOTE — Progress Notes (Signed)
Virtual Visit via Video Note  I connected with Rachael Gill on 08/20/23 at  3:00 PM EST by a video enabled telemedicine application and verified that I am speaking with the correct person using two identifiers.  Location: Patient: Home Provider: Office    I discussed the limitations of evaluation and management by telemedicine and the availability of in person appointments. The patient expressed understanding and agreed to proceed.  History of Present Illness: 82 year old female, former smoker.  Past medical history significant for hypertension, mitral regurgitation, pulmonary hypertension, type 2 diabetes, hyperlipidemia, just apnea  Previous [facility] pulmonary encounter:  06/17/2023 Patient presents today for sleep consult.  She has symptoms of snoring and witnessed apnea.  She reports waking up at night with thick phlegm which will cause her to choke, she has a hard time bringing up mucus. Her sleep is disrupted. He daughter has witnessed her stop breathing a "long time" at night while sleeping. She had a sleep study more than 10 years ago. Her sister and daughter both have sleep apnea.   Witnessed episode of apnea - Patient has symptoms of snoring and witnessed apnea. Associated postnasal drip, she has woken up choking on mucus. She has family history of sleep apnea.  Concern patient could have underlying obstructive sleep apnea, needs home sleep study evaluate.  We reviewed risk of untreated sleep apnea including cardiac arrhythmias, pulmonary hypertension, diabetes and stroke.  We also discussed treatment options including weight loss, oral appliance, CPAP therapy or referral to ENT for possible surgical options.  Encourage patient focus on side sleeping position or elevate head of bed 30 degrees.  Advised against driving if experiencing excessive daytime sleepiness fatigue.  FU in 1-2 weeks after sleep study for results.    Post-nasal drip - Patient wakes up at night with PND symptoms and thick  mucus in the back of her throat. Recommend patient use saline nasal spray at bedtime and take prn robitussin to loosen congestion. Consider ENT referral if not better with above plan and if sleep study is not diagnostic.   08/20/2023- Interim hx  Discussed the use of AI scribe software for clinical note transcription with the patient, who gave verbal consent to proceed.  Patient was seen for a sleep consult in September 2024 due to symptoms of snoring and witnessed apnea. Associated postnasal drip, she has woken up choking on mucus. She has family history of sleep apnea. HST 06/15/23 showed severe sleep apnea, average AHI 28.5/hour with SpO2 low 88% (baseline 97%). Patient spent 0 minutes with an oxygen level <88%.   She had another sleep study last night through [facility], she was under the assumption that we ordered the testing. Appears her cardiologist with [facility] health placed that order. We reviewed her HST today from 06/15/23 and her treatment options, recommending patient be started on auto CPAP due to severity of underlying OSA. Patient is in agreement with plan.   The patient also reported experiencing hot flashes, which were discussed with a gynecologist. The gynecologist suggested that these symptoms might be interconnected with the patient's sleep issues.   Observations/Objective:  Appears well without overt respiratory symptoms  Assessment and Plan:  Severe Sleep Apnea Seen for a sleep consult in September 2024 due to symptoms of snoring and witnessed apnea. Diagnosed via home sleep study on June 15, 2023, with an average of 49 apneic events per hour. Baseline oxygen level was 97%, with a lowest level of 88%. Reviewed sleep study results, risks of untreated sleep apnea and  treatment options with patient. Recommending patient be started on AUTO CPAP dur to severity of her OSA and patient is in agreement with plan -Order Auto CPAP 5-20cm h20, patient would prefer to use a medical supply  store in [facility] -Advise patient to wear CPAP anytime she is sleeping, including naps. -Continue weight loss efforts and advised against driving if exercising excessive daytime sleepiness   Follow Up Instructions:   -Schedule follow-up appointment in 6 weeks to assess CPAP usage and effectiveness.  I discussed the assessment and treatment plan with the patient. The patient was provided an opportunity to ask questions and all were answered. The patient agreed with the plan and demonstrated an understanding of the instructions.   The patient was advised to call back or seek an in-person evaluation if the symptoms worsen or if the condition fails to improve as anticipated.  I provided 22 minutes of non-face-to-face time during this encounter.   [name], NP

## 2023-08-20 NOTE — Telephone Encounter (Signed)
Need virtual visit in 6-8 weeks for CPAP compliance/new start

## 2023-08-20 NOTE — Patient Instructions (Signed)
Recommendation: Aim to   Follow-up:    CPAP and BIPAP Information CPAP and BIPAP are methods that use air pressure to keep your airways open and to help you breathe well. CPAP and BIPAP use different amounts of pressure. Your health care provider will tell you whether CPAP or BIPAP would be more helpful for you. CPAP stands for "continuous positive airway pressure." With CPAP, the amount of pressure stays the same while you breathe in (inhale) and out (exhale). BIPAP stands for "bi-level positive airway pressure." With BIPAP, the amount of pressure will be higher when you inhale and lower when you exhale. This allows you to take larger breaths. CPAP or BIPAP may be used in the hospital, or your health care provider may want you to use it at home. You may need to have a sleep study before your health care provider can order a machine for you to use at home. What are the advantages? CPAP or BIPAP can be helpful if you have: Sleep apnea. Chronic obstructive pulmonary disease (COPD). Heart failure. Medical conditions that cause muscle weakness, including muscular dystrophy or amyotrophic lateral sclerosis (ALS). Other problems that cause breathing to be shallow, weak, abnormal, or difficult. CPAP and BIPAP are most commonly used for obstructive sleep apnea (OSA) to keep the airways from collapsing when the muscles relax during sleep. What are the risks? Generally, this is a safe treatment. However, problems may occur, including: Irritated skin or skin sores if the mask does not fit properly. Dry or stuffy nose or nosebleeds. Dry mouth. Feeling gassy or bloated. Sinus or lung infection if the equipment is not cleaned properly. When should CPAP or BIPAP be used? In most cases, the mask only needs to be worn during sleep. Generally, the mask needs to be worn throughout the night and during any daytime naps. People with certain medical conditions may also need to wear the mask at other times,  such as when they are awake. Follow instructions from your health care provider about when to use the machine. What happens during CPAP or BIPAP?  Both CPAP and BIPAP are provided by a small machine with a flexible plastic tube that attaches to a plastic mask that you wear. Air is blown through the mask into your nose or mouth. The amount of pressure that is used to blow the air can be adjusted on the machine. Your health care provider will set the pressure setting and help you find the best mask for you. Tips for using the mask Because the mask needs to be snug, some people feel trapped or closed-in (claustrophobic) when first using the mask. If you feel this way, you may need to get used to the mask. One way to do this is to hold the mask loosely over your nose or mouth and then gradually apply the mask more snugly. You can also gradually increase the amount of time that you use the mask. Masks are available in various types and sizes. If your mask does not fit well, talk with your health care provider about getting a different one. Some common types of masks include: Full face masks, which fit over the mouth and nose. Nasal masks, which fit over the nose. Nasal pillow or prong masks, which fit into the nostrils. If you are using a mask that fits over your nose and you tend to breathe through your mouth, a chin strap may be applied to help keep your mouth closed. Use a skin barrier to protect your skin  as told by your health care provider. Some CPAP and BIPAP machines have alarms that may sound if the mask comes off or develops a leak. If you have trouble with the mask, it is very important that you talk with your health care provider about finding a way to make the mask easier to tolerate. Do not stop using the mask. There could be a negative impact on your health if you stop using the mask. Tips for using the machine Place your CPAP or BIPAP machine on a secure table or stand near an electrical  outlet. Know where the on/off switch is on the machine. Follow instructions from your health care provider about how to set the pressure on your machine and when you should use it. Do not eat or drink while the CPAP or BIPAP machine is on. Food or fluids could get pushed into your lungs by the pressure of the CPAP or BIPAP. For home use, CPAP and BIPAP machines can be rented or purchased through home health care companies. Many different brands of machines are available. Renting a machine before purchasing may help you find out which particular machine works well for you. Your health insurance company may also decide which machine you may get. Keep the CPAP or BIPAP machine and attachments clean. Ask your health care provider for specific instructions. Check the humidifier if you have a dry stuffy nose or nosebleeds. Make sure it is working correctly. Follow these instructions at home: Take over-the-counter and prescription medicines only as told by your health care provider. Ask if you can take sinus medicine if your sinuses are blocked. Do not use any products that contain nicotine or tobacco. These products include cigarettes, chewing tobacco, and vaping devices, such as e-cigarettes. If you need help quitting, ask your health care provider. Keep all follow-up visits. This is important. Contact a health care provider if: You have redness or pressure sores on your head, face, mouth, or nose from the mask or head gear. You have trouble using the CPAP or BIPAP machine. You cannot tolerate wearing the CPAP or BIPAP mask. Someone tells you that you snore even when wearing your CPAP or BIPAP. Get help right away if: You have trouble breathing. You feel confused. Summary CPAP and BIPAP are methods that use air pressure to keep your airways open and to help you breathe well. If you have trouble with the mask, it is very important that you talk with your health care provider about finding a way to  make the mask easier to tolerate. Do not stop using the mask. There could be a negative impact to your health if you stop using the mask. Follow instructions from your health care provider about when to use the machine. This information is not intended to replace advice given to you by your health care provider. Make sure you discuss any questions you have with your health care provider. Document Revised: 04/23/2021 Document Reviewed: 08/23/2020 Elsevier Patient Education  2023 [employer].

## 2023-08-23 ENCOUNTER — Telehealth: Admit: 2023-08-23 | Discharge: 2023-08-24 | Payer: MEDICARE | Attending: Internal Medicine | Primary: Internal Medicine

## 2023-08-23 DIAGNOSIS — Z7185 Vaccine counseling: Principal | ICD-10-CM

## 2023-08-23 NOTE — Unmapped (Signed)
Contact Information     Person Contacted: Patient   Contact Phone number: [phone]   Phone Outcome: Contacted Patient/Caregiver Yes     Kayla Knight is 82 y.o.  participating in a real-time audio and video visit encounter.       Reason for Call    Chief Complaint   Patient presents with    Questions           Subjective:    Kayla Knight presents for evaluation of the following concern(s):    Should she have a influenza vaccine(s)?  She has had COVID-19 and influenza despite previous vaccine(s)?      She has recent polysomnography consistent with obstructive sleep apnea.  CPAP has been ordered.      She purchased an air purifier which helped with post nasal drip and AM phlegm production.         I have reviewed the problem list, medications, and allergies and have updated/reconciled them if needed.     Physical Exam:    As part of this visit, no in-person exam was conducted.  If applicable, video visit findings are noted below:      She is alert, fully oriented, and in no apparent distress.        Assessment & Plan:     Encounter Diagnosis   Name Primary?    Vaccine counseling Yes          Detailed vaccine counseling and review.  Strongly recommended influenza vaccines to reduce complications of influenza (inclusive of hospitalization and death).  She will pursue at pharmacy.      Briefly reviewed polysomnography--AHI was 28.7, associated arousals, desaturation, PLMs.  Follow up as scheduled with appropriate provider or sooner prn.        No orders of the defined types were placed in this encounter.         Signs and symptoms that should prompt further evaluation were reviewed with the patient.      The patient reports they are physically located in [place] and is currently: at home. I conducted a audio/video visit. I spent  50m 13s on the video call with the patient. I spent an additional  5 minutes on pre- and post-visit activities on the date of service .

## 2023-08-23 NOTE — Telephone Encounter (Signed)
Left message on VM for patient to call clinic to set upt virtual visit to review CPAP compliance/new start

## 2023-08-30 NOTE — Telephone Encounter (Signed)
Left message x2 on VM for patient to call clinic to set upt virtual visit in 6-8 weeks to review CPAP compliance/new start.  Will send MyChart message.  This visit should be around mid January 2025.

## 2023-09-01 NOTE — Unmapped (Signed)
[facility] Refill Coordination Note    Specialty Medication(s) to be Shipped:   Specialty Lite: Repatha    Other medication(s) to be shipped: No additional medications requested for fill at this time     Kayla Knight, DOB: September 01, 1941  Phone: [phone] (home)       All above HIPAA information was verified with patient.     Was a Nurse, learning disability used for this call? No    Completed refill call assessment today to schedule patient's medication shipment from the [facility]  [phone]).  All relevant notes have been reviewed.     Specialty medication(s) and dose(s) confirmed: Regimen is correct and unchanged.   Changes to medications: Kayla Knight reports no changes at this time.  Changes to insurance: No  New side effects reported not previously addressed with a pharmacist or physician: None reported  Questions for the pharmacist: No    Confirmed patient received a Conservation officer, historic buildings and a Surveyor, mining with first shipment. The patient will receive a drug information handout for each medication shipped and additional FDA Medication Guides as required.       DISEASE/MEDICATION-SPECIFIC INFORMATION        For patients on injectable medications: Patient currently has 0 doses left.  Next injection is scheduled for 09/11/23.    SPECIALTY MEDICATION ADHERENCE     Medication Adherence    Patient reported X missed doses in the last month: 0  Specialty Medication: Repatha 140mg /ml  Patient is on additional specialty medications: No              Were doses missed due to medication being on hold? No    Repatha 140mg /ml: 0 days of medicine on hand     REFERRAL TO PHARMACIST     Referral to the pharmacist: Not needed      [facility]     Shipping address confirmed in Epic.       Delivery Scheduled: Yes, Expected medication delivery date: 09/11/23.     Medication will be delivered via Same Day Courier to the prescription address in Epic WAM.    Tera Helper, [facility]   [facility]  Specialty Pharmacist

## 2023-09-04 ENCOUNTER — Ambulatory Visit: Admit: 2023-09-04 | Discharge: 2023-09-05 | Payer: MEDICARE

## 2023-09-04 DIAGNOSIS — J3489 Other specified disorders of nose and nasal sinuses: Principal | ICD-10-CM

## 2023-09-04 NOTE — Unmapped (Addendum)
It was a pleasure seeing you today  Your influenza and COVID-19 tests today are negative.  Please use saline nasal spray two to three times daily.

## 2023-09-04 NOTE — Unmapped (Signed)
[name] NOTE    ASSESSMENT/PLAN:    Problem List Items Addressed This Visit    None  Visit Diagnoses         Nasal drainage    -  Primary        POCT Rapid COVID-19 and influenza tests today are negative.  Continue supportive care measures at home  Return to clinic if symptoms worsen or do not improve.    Chief Complaint   Patient presents with    running nose x 3 days        SUBJECTIVE:    Kayla Knight is a 82 y.o. female with a history of   Patient Active Problem List   Diagnosis    Benign essential hypertension    Primary osteoarthritis of right knee    Idiopathic peripheral neuropathy    MR (mitral regurgitation)    Type 2 diabetes mellitus without complication, without long-term current use of insulin (CMS-HCC)    Neck pain    Encounter for subsequent annual wellness visit (AWV) in Medicare patient    Lipoma of torso    Skin lesion    Hearing loss    Lichen sclerosus et atrophicus of the vulva    Renal artery stenosis, native, bilateral (CMS-HCC)    Hyperlipidemia, unspecified    Aftercare following joint replacement    Presence of left artificial knee joint    Hypokalemia    History of chest pain    Decreased GFR    Nonrheumatic aortic valve insufficiency    Endometrial thickening on ultrasound    Weakness of both hips    Right hip pain    Hot flashes    Aortic valve disease    Post-nasal drip    Witnessed episode of apnea    Glaucoma suspect of both eyes    Dry eye syndrome, bilateral    PVD (posterior vitreous detachment), bilateral    S/P cataract extraction and insertion of intraocular lens, left    S/P cataract extraction and insertion of intraocular lens, right    Meibomian gland dysfunction (MGD), bilateral, both upper and lower lids    that presents to Urgent Care  today regarding the following issues:    #1 Nasal drainge for 3 days. No cough, fever, congestion or sore throat. She is concerned that she may have COVID-19 or influenza. She has been doing saline nasal rinse.      I have reviewed the patients problem list, medical history, surgical history, laboratory history and recent hospitalizations, current medications, allergies, and social history and updated them as needed.    Review of symptoms:  Negative unless  Otherwise stated in HPI.     Kayla Knight  reports that she quit smoking about 39 years ago. Her smoking use included cigarettes. She has been exposed to tobacco smoke. She has never used smokeless tobacco.    OBJECTIVE:    VITALS:   Vitals:    09/04/23 1345   BP: 164/71   Pulse: 60   Temp: 37 ??C (98.6 ??F)   SpO2: 97%    Wt:   Wt Readings from Last 3 Encounters:   09/06/23 73.5 kg (162 lb)   09/04/23 73.5 kg (162 lb)   08/09/23 73.5 kg (162 lb)       Gen: Well appearing, pleasant and cooperative in NAD, appears stated age  Head: Normocephalic, atraumatic  EENT: , sclerae clear, bilateral nasal passages without obstruction without discharge,TMs  with normal light reflex and landmarks, without effusion, no erythema,  Resp:  No increased work of breathing  Neuro:  A&O x 4,  normal gait  Skin: Warm and dry,   Psych:  Mood is good, able to carry on normal conversation, with good eye contact, without evidence of anxiety or depression    LABS:  Results for orders placed or performed in visit on 09/04/23   POCT Influenza A/B   Result Value Ref Range    Influenza A Negative Negative    Influenza B Negative Negative   POCT SARS-COV-2   Result Value Ref Range    POCT SARS-CoV-2 NAA Negative Negative       STUDIES:  No results found.    ___________________________________  CURRENT MEDS:  Current Outpatient Medications   Medication Sig Dispense Refill    amlodipine (NORVASC) 10 MG tablet Take 1 tablet (10 mg total) by mouth daily. 90 tablet 3    azithromycin (ZITHROMAX) 250 MG tablet Take 1 tablet (250 mg total) by mouth 3 (three) times a week. 40 tablet 0    clopidogrel (PLAVIX) 75 mg tablet Take 1 tablet (75 mg total) by mouth daily.      empty container (SHARPS-A-GATOR DISPOSAL SYSTEM) Misc Use as directed for sharps disposal 1 each 2    erythromycin (ROMYCIN) 5 mg/gram (0.5 %) ophthalmic ointment Administer to both eyes nightly. 3.5 g 3    evolocumab (REPATHA SURECLICK) 140 mg/mL PnIj Inject the contents of 1 pen (140 mg) under the skin every fourteen (14) days. 6 mL 3    famotidine (PEPCID) 20 MG tablet Take 1 tablet (20 mg total) by mouth two (2) times a day. 180 tablet 3    mag hydrox/aluminum hyd/simeth (ANTACID ANTI-GAS ORAL) Take by mouth.      omeprazole (PRILOSEC) 20 MG capsule Take 1 capsule (20 mg total) by mouth two (2) times a day. 180 capsule 2    spironolactone (ALDACTONE) 50 MG tablet Take 0.5 tablets (25 mg total) by mouth daily.      timolol (TIMOPTIC) 0.25 % ophthalmic solution Administer 1 drop to both eyes daily. 5 mL 5     No current facility-administered medications for this visit.       ___________________________________  ALLERGIES:  Allergies   Allergen Reactions    Penicillins Itching and Swelling    Lisinopril Cough    Statins-Hmg-Coa Reductase Inhibitors      Ineffective at controlling cholesterol when tried in the past.    Sulfa (Sulfonamide Antibiotics) Itching     ------------------------    PAST MEDICAL HISTORY:   Past Medical History:   Diagnosis Date    Anemia     past history, over 40 yrs ago    Cataract 2008    Surgery    Diabetes mellitus (CMS-HCC)     Type II, on 9/15 patient stated she was not longer diabetic, not taking meds    Dry eye     Hearing impairment     Heart murmur 2016    [facility]. Verdie [facility] @ [facility]    Heart valve disease     Heart mumur, pt reports diagnosis 10 yrs ago (05/10/20)    History of stent insertion of renal artery 01/29/2020    ASA indefinitely and plavix x 6 months per procedure note    Hypertension     pt reports taking meds since about 1995    Neuromuscular disorder (CMS-HCC) 2014    Feet & Legs  Tear of meniscus of knee     4.5 yrs ago    Urinary incontinence     seen a doctor about nighttime bathroom use (adressed in 2019)         FMURGENTCARETRACKING       Did today's visit save an ED/Direct Admission?  yes    Follow-up with PCP        [facility]  [address] [place], [place], [place] [address]  Telephone [phone]  Fax [phone]  [email or web address]

## 2023-09-06 ENCOUNTER — Ambulatory Visit: Admit: 2023-09-06 | Discharge: 2023-09-07 | Payer: MEDICARE

## 2023-09-06 ENCOUNTER — Ambulatory Visit: Admit: 2023-09-06 | Discharge: 2023-09-07 | Payer: MEDICARE | Attending: Family | Primary: Family

## 2023-09-06 DIAGNOSIS — M79605 Pain in left leg: Principal | ICD-10-CM

## 2023-09-06 LAB — BASIC METABOLIC PANEL
ANION GAP: 11 mmol/L (ref 5–14)
BLOOD UREA NITROGEN: 17 mg/dL (ref 9–23)
BUN / CREAT RATIO: 17
CALCIUM: 10.2 mg/dL (ref 8.7–10.4)
CHLORIDE: 103 mmol/L (ref 98–107)
CO2: 26 mmol/L (ref 20.0–31.0)
CREATININE: 1.01 mg/dL (ref 0.55–1.02)
EGFR CKD-EPI (2021) FEMALE: 56 mL/min/{1.73_m2} — ABNORMAL LOW (ref >=60)
GLUCOSE RANDOM: 69 mg/dL — ABNORMAL LOW (ref 70–179)
POTASSIUM: 4.7 mmol/L (ref 3.4–4.8)
SODIUM: 140 mmol/L (ref 135–145)

## 2023-09-06 LAB — D-DIMER, QUANTITATIVE: D-DIMER QUANTITATIVE (CW,ML,HL,HS,CH,JS,JC,RX,RH): 854 ng{FEU}/mL — ABNORMAL HIGH (ref <=500)

## 2023-09-06 MED FILL — REPATHA SURECLICK 140 MG/ML SUBCUTANEOUS PEN INJECTOR: SUBCUTANEOUS | 84 days supply | Qty: 6 | Fill #2

## 2023-09-07 ENCOUNTER — Ambulatory Visit: Admit: 2023-09-07 | Discharge: 2023-09-08 | Payer: MEDICARE

## 2023-09-07 NOTE — Unmapped (Signed)
 [facility] CLINIC NOTE      09/08/2023    PCP: Virgina Evener, MD       ASSESSMENT/PLAN:  Any labs and radiology results that were available during my care of the patient were independently reviewed by me and considered in my medical decision making.    Impression:     82 y/o female presents to clinic today with leg pain.     Sudden onset left calf pain concerning for DVT. Unable to schedule PVL today but will go tomorrow morning. Will check labs today and strict ER precautions reviewed.     Plan:     1. Left leg pain   - D-dimer and BMP done in clinic today   - PVL scheduled for tomorrow   - Strict ER precautions reviewed     Continue supportive care at home    Problem List Items Addressed This Visit    None  Visit Diagnoses         Left leg pain    -  Primary    Relevant Orders    PVL Venous Duplex Lower Extremity Left (Completed)    D-Dimer, Quantitative (Completed)    Basic metabolic panel (Completed)            RTC: if symptoms worsen or fail to improve      Follow-up with PCP    Items for PCP to follow-up on next visit: Leg Pain           -----------------------------------------------------------  SUBJECTIVE:  Chief Complaint   Patient presents with    Leg Pain     Left leg x2 days        HPI:  Kayla Knight is a 82 y.o. female that presents to clinic today regarding the following issues:    # Left Leg Pain   Onset: sudden     Duration: 3 days      Associated symptoms:  Difficulty walking   Concern for blood  clot because she feels knot   Worsening pain at night     Pertinent negatives:  Shortness of breath   Chest pain     Treatments tried:         ROS: Please see HPI.     -----------------------------------------------------------  PAST MEDICAL HISTORY:   Past Medical History:   Diagnosis Date    Anemia     past history, over 40 yrs ago    Cataract 2008    Surgery    Diabetes mellitus (CMS-HCC)     Type II, on 9/15 patient stated she was not longer diabetic, not taking meds    Dry eye     Hearing impairment     Heart murmur 2016    [facility] Verdie [facility]    Heart valve disease     Heart mumur, pt reports diagnosis 10 yrs ago (05/10/20)    History of stent insertion of renal artery 01/29/2020    ASA indefinitely and plavix x 6 months per procedure note    Hypertension     pt reports taking meds since about 1995    Neuromuscular disorder (CMS-HCC) 2014    Feet & Legs    Tear of meniscus of knee     4.5 yrs ago    Urinary incontinence     seen a doctor about nighttime bathroom use (adressed in 2019)       PAST SURGICAL HISTORY:  Past  Surgical History:   Procedure Laterality Date    CATARACT EXTRACTION W/ INTRAOCULAR LENS IMPLANT Left 02/27/2016    EYE SURGERY  01/30/16    Cataract surgery is scheduled.    JOINT REPLACEMENT  Knee    KNEE CARTILAGE SURGERY Right 2007, 2008    x 2    KNEE SURGERY      pt reports total left knee joint replacement surgery around 2016, 2017    PLANTAR FASCIECTOMY Left     PR REVASCULARIZE FEM/POP ARTERY,ANGIOPLASTY/STENT N/A 01/29/2020    Procedure: Peripheral Angiography W Intervetion;  Surgeon: Alvira Philips, MD;  Location: [facility] CATH;  Service: Cardiology    PR REVASCULARIZE FEM/POP ARTERY,ANGIOPLASTY/STENT Right 12/26/2021    Procedure: right renal artery intervention;  Surgeon: Alvira Philips, MD;  Location: [facility] CATH;  Service: Cardiology    PR UPPER GI ENDOSCOPY,BIOPSY N/A 04/01/2021    Procedure: UGI ENDOSCOPY; WITH BIOPSY, SINGLE OR MULTIPLE;  Surgeon: Jarvis Morgan, MD;  Location: [facility];  Service: Gastroenterology    PR XCAPSL CTRC RMVL INSJ IO LENS PROSTH W/O ECP Right 02/13/2016    Procedure: EXTRACAPSULAR CATARACT REMOVAL W/INSERTION OF INTRAOCULAR LENS PROSTHESIS, MANUAL OR MECHANICAL TECHNIQUE;  Surgeon: Garey Ham, MD;  Location: [facility];  Service: Ophthalmology    PR XCAPSL CTRC RMVL INSJ IO LENS PROSTH W/O ECP Left 02/27/2016    Procedure: EXTRACAPSULAR CATARACT REMOVAL W/INSERTION OF INTRAOCULAR LENS PROSTHESIS, MANUAL OR MECHANICAL TECHNIQUE;  Surgeon: Garey Ham, MD;  Location: [facility] OR [facility];  Service: Ophthalmology    RENAL ARTERY STENT  01/11/2020    ROOT CANAL      TUBAL LIGATION  1973    [facility]    WISDOM TOOTH EXTRACTION         SOCIAL HISTORY:  Social History     Socioeconomic History    Marital status: Single   Tobacco Use    Smoking status: Former     Current packs/day: 0.00     Types: Cigarettes     Quit date: 05/19/1984     Years since quitting: 39.3     Passive exposure: Past    Smokeless tobacco: Never   Vaping Use    Vaping status: Never Used   Substance and Sexual Activity    Alcohol use: Yes     Comment: occassional    Drug use: Never    Sexual activity: Not Currently     Partners: Male   Social History Narrative    Exercise: water aerobics 3-4 x week for an hour        Eye: Dr.Merrit @ [facility] 2018        Dentist: [facility] 2018     Social Drivers of Health     Financial Resource Strain: Low Risk  (01/04/2023)    Overall Financial Resource Strain (CARDIA)     Difficulty of Paying Living Expenses: Not hard at all   Food Insecurity: No Food Insecurity (01/04/2023)    Hunger Vital Sign     Worried About Running Out of Food in the Last Year: Never true     Ran Out of Food in the Last Year: Never true   Transportation Needs: No Transportation Needs (01/04/2023)    PRAPARE - Therapist, art (Medical): No     Lack of Transportation (Non-Medical): No   Physical Activity: Sufficiently Active (01/04/2023)    Exercise Vital Sign     Days of  Exercise per Week: 7 days     Minutes of Exercise per Session: 30 min   Stress: No Stress Concern Present (01/04/2023)    [employer] of Occupational Health - Occupational Stress Questionnaire     Feeling of Stress : Only a little   Social Connections: Moderately Isolated (01/04/2023)    Social Connection and Isolation Panel [NHANES]     Frequency of Communication with Friends and Family: More than three times a week     Frequency of Social Gatherings with Friends and Family: More than three times a week     Attends Religious Services: Never     Database administrator or Organizations: Yes     Attends Engineer, structural: More than 4 times per year     Marital Status: Divorced       FAMILY HISTORY:  Family History   Problem Relation Age of Onset    Heart disease Mother     Hypertension Mother     Dementia Mother     Arthritis Mother     Heart disease Father     Thyroid disease Father     Glaucoma Father     Alcohol abuse Father     Heart attack Father 7    Arthritis Father     Hypertension Father         Massive heart attack    Arthritis Sister     Glaucoma Sister     Hypertension Sister     Diabetes Sister     Diabetes Daughter     Diabetes Paternal Aunt     No Known Problems Brother     No Known Problems Maternal Aunt     No Known Problems Maternal Uncle     No Known Problems Paternal Uncle     No Known Problems Maternal Grandmother     No Known Problems Maternal Grandfather     No Known Problems Paternal Grandmother     No Known Problems Paternal Grandfather     Arthritis Sister         Noticeable in extremities    COPD Sister     Depression Sister     Hypertension Sister     Glaucoma Sister     Asthma Daughter         Ocassional    Hypertension Daughter     Miscarriages / [place] Daughter     Cancer Maternal Aunt         Died more than 59yrs ago    Early death Maternal Aunt     Diabetes Paternal Aunt         4 Aunts    Kidney disease Paternal Aunt     Amblyopia Neg Hx     Blindness Neg Hx     Cancer Neg Hx     Cataracts Neg Hx     Macular degeneration Neg Hx     Retinal detachment Neg Hx     Strabismus Neg Hx     Stroke Neg Hx        ___________________________________  CURRENT MEDS:  Current Outpatient Medications   Medication Sig Dispense Refill    amlodipine (NORVASC) 10 MG tablet Take 1 tablet (10 mg total) by mouth daily. 90 tablet 3    azithromycin (ZITHROMAX) 250 MG tablet Take 1 tablet (250 mg total) by mouth 3 (three) times a week. 40 tablet 0    clopidogrel (PLAVIX) 75 mg tablet Take 1 tablet (75 mg  total) by mouth daily.      empty container (SHARPS-A-GATOR DISPOSAL SYSTEM) Misc Use as directed for sharps disposal 1 each 2    erythromycin (ROMYCIN) 5 mg/gram (0.5 %) ophthalmic ointment Administer to both eyes nightly. 3.5 g 3    evolocumab (REPATHA SURECLICK) 140 mg/mL PnIj Inject the contents of 1 pen (140 mg) under the skin every fourteen (14) days. 6 mL 3    famotidine (PEPCID) 20 MG tablet Take 1 tablet (20 mg total) by mouth two (2) times a day. 180 tablet 3    mag hydrox/aluminum hyd/simeth (ANTACID ANTI-GAS ORAL) Take by mouth.      omeprazole (PRILOSEC) 20 MG capsule Take 1 capsule (20 mg total) by mouth two (2) times a day. 180 capsule 2    spironolactone (ALDACTONE) 50 MG tablet Take 0.5 tablets (25 mg total) by mouth daily.      timolol (TIMOPTIC) 0.25 % ophthalmic solution Administer 1 drop to both eyes daily. 5 mL 5     No current facility-administered medications for this visit.       ___________________________________  ALLERGIES:  Allergies   Allergen Reactions    Penicillins Itching and Swelling    Lisinopril Cough    Statins-Hmg-Coa Reductase Inhibitors      Ineffective at controlling cholesterol when tried in the past.    Sulfa (Sulfonamide Antibiotics) Itching     -----------------------------------------------------------  OBJECTIVE:  Physical Exam:  VITALS:   Vitals:    09/06/23 1603   BP: 183/79   Pulse: 66   Temp: 36.7 ??C (98 ??F)    Wt:   Wt Readings from Last 3 Encounters:   09/06/23 73.5 kg (162 lb)   09/04/23 73.5 kg (162 lb)   08/09/23 73.5 kg (162 lb)       PHQ-2 Score:           Physical Exam  Vitals and nursing note reviewed.   Constitutional:       General: She is not in acute distress.  Cardiovascular:      Rate and Rhythm: Normal rate and regular rhythm.      Pulses: Normal pulses.      Heart sounds: Normal heart sounds. Pulmonary:      Effort: Pulmonary effort is normal.      Breath sounds: Normal breath sounds.   Musculoskeletal:      Left lower leg: Tenderness present. No swelling or bony tenderness.   Skin:     General: Skin is warm and dry.   Neurological:      Mental Status: She is alert and oriented to person, place, and time.   Psychiatric:         Behavior: Behavior normal.               LABS:  Office Visit on 09/06/2023   Component Date Value Ref Range Status    Sodium 09/06/2023 140  135 - 145 mmol/L Final    Potassium 09/06/2023 4.7  3.4 - 4.8 mmol/L Final    Chloride 09/06/2023 103  98 - 107 mmol/L Final    CO2 09/06/2023 26.0  20.0 - 31.0 mmol/L Final    Anion Gap 09/06/2023 11  5 - 14 mmol/L Final    BUN 09/06/2023 17  9 - 23 mg/dL Final    Creatinine 16/06/9603 1.01  0.55 - 1.02 mg/dL Final    BUN/Creatinine Ratio 09/06/2023 17   Final    eGFR CKD-EPI (2021) Female 09/06/2023 56 (L)  >=60 mL/min/1.51m2  Final    Glucose 09/06/2023 69 (L)  70 - 179 mg/dL Final    Calcium 69/62/9528 10.2  8.7 - 10.4 mg/dL Final   Appointment on 09/06/2023   Component Date Value Ref Range Status    D-Dimer 09/06/2023 854 (H)  <=500 ng/mL FEU Final   Office Visit on 09/04/2023   Component Date Value Ref Range Status    Influenza A 09/04/2023 Negative  Negative Final    Influenza B 09/04/2023 Negative  Negative Final    POCT SARS-CoV-2 NAA 09/04/2023 Negative  Negative Final       STUDIES:  PVL Venous Duplex Lower Extremity Left  Result Date: 09/07/2023   Peripheral Vascular Lab     [address]   [place], [place] [address]  PVL VENOUS DUPLEX LOWER EXTREMITY LEFT Patient Demographics Pt. Name: TAMMY SAYNE Location: PVL Outpatient Lab MRN:      [id number] Sex:      F DOB:      [birth year]     Age:      45 years  Study Information Authorizing         [id number] [facility] LEE      Performed Time       09/07/2023 Provider Name       Robyne Matar                                     10:23:08 AM Ordering Physician  Paulita Fujita      Patient Location     [facility] Accession Number    [id number] UN       Technologist         Eli Phillips                                                               RVT Diagnosis:                               Assisting            Adelene Amas,                                          Technologist         Student Ordered Reason For Exam: left calf pain and swelling Indication: acute-onset left calf pain x 3 days, lessened in severity at time of exam; elevated D-dimer Risk Factors: None Identified. Protocol The major deep veins from the inguinal ligament to the ankle are assessed for compressibility and color and spectral Doppler flow characteristics on the requested limb. The assessed veins include common femoral vein, femoral vein in the thigh, popliteal vein, and intramuscular calf veins. The iliac vein is assessed indirectly using Doppler waveform analysis. The great saphenous vein is assessed for compressibility at the saphenofemoral junction, and the small saphenous vein assessed for compressibility behind the knee. A contralateral PW Doppler waveform is obtained for comparison.  Final Interpretation Right There is no evidence of obstruction proximal to the inguinal ligament or in the common femoral vein. Left There is  no evidence of DVT in the lower extremity. There is no evidence of obstruction proximal to the inguinal ligament or in the common femoral vein. No abnormality in the area of pain (see below)  Electronically signed by [id number] Jodell Cipro MD on 09/07/2023 at 11:43:34 AM.  -------------------------------------------------------------------------------- Right Duplex Findings CFV Doppler waveform appears appropriately phasic.  Left Duplex Findings All veins visualized appear fully compressible. Doppler flow signals demonstrate normal spontaneity, phasicity, and augmentation. Due to prominent arterial plaque observed during exam, the distal tibial arteries were briefly interrogated to rule out a possible arterial etiology of pain. Multiphasic waveforms were noted in the distal PTA and distal peroneal artery. 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 Imaging of the area of focal pain at the posteromedial prox-mid calf demonstrated no evidence of vascular abnormality. Right Technical Summary No evidence of iliofemoral obstruction. Left Technical Summary No evidence of deep venous obstruction in the lower extremity. No indirect evidence of obstruction proximal to the inguinal ligament. Imaging of the area of focal pain at the posteromedial prox-mid calf demonstrated no evidence of vascular abnormality. ** Final [facility]  [place] [facility]  [address], [place], [place] [address]  Telephone [phone]  Fax [phone]  [email or web address]    Scribe's Attestation: Aida Puffer, FNP obtained and performed the history, physical exam and medical decision making elements that were entered into the chart.  Signed by Durenda Guthrie, Scribe, on September 06, 2023 at 4:41 PM.    The documentation recorded by the scribe accurately reflects the service I personally performed and the decisions made by me. Aida Puffer, FNP  September 08, 2023 8:52 AM

## 2023-09-07 NOTE — Unmapped (Signed)
-   -   Should symptoms worsen go to ER

## 2023-09-13 MED ORDER — CLOPIDOGREL 75 MG TABLET
ORAL_TABLET | Freq: Every day | ORAL | 2 refills | 0.00 days
Start: 2023-09-13 — End: ?

## 2023-09-14 MED ORDER — CLOPIDOGREL 75 MG TABLET
ORAL_TABLET | Freq: Every day | ORAL | 2 refills | 90.00 days | Status: CP
Start: 2023-09-14 — End: ?

## 2023-09-14 NOTE — Unmapped (Signed)
Had left a message requesting a refill of her Clopidogrel.  I returned her call to let her know that her Rx was sent to her pharmacy this am.

## 2023-09-14 NOTE — Unmapped (Signed)
error 

## 2023-09-14 NOTE — Unmapped (Signed)
Refill request received for patient.      Medication Requested: Plavix  Last Office Visit: 08/09/2023   Next Office Visit: Visit date not found  Last Prescriber: Historical Provider(Weickert/Taylor)    Nurse refill requirements met? Yes  If not met, why:     Sent to: Pharmacy per protocol  If sent to provider, which provider?:

## 2023-09-14 NOTE — Unmapped (Signed)
Per Dr. Lolita Lenz, called, spoke with patient, and scheduled an appointment for tomorrow morning at 0800.

## 2023-09-14 NOTE — Unmapped (Signed)
Patient called to request testing to rule in/out colon cancer.  She stated she forgot to mention a change in stool shape at her last appointment with Dr. Lolita Lenz.  Will consult with Dr. Lolita Lenz to see if he would like to see her first or simply order colonoscopy given how recently she was last seen.

## 2023-09-15 ENCOUNTER — Ambulatory Visit: Admit: 2023-09-15 | Discharge: 2023-09-16 | Payer: MEDICARE | Attending: Internal Medicine | Primary: Internal Medicine

## 2023-09-15 DIAGNOSIS — R194 Change in bowel habit: Principal | ICD-10-CM

## 2023-09-15 DIAGNOSIS — K219 Gastro-esophageal reflux disease without esophagitis: Principal | ICD-10-CM

## 2023-09-15 DIAGNOSIS — R1084 Generalized abdominal pain: Principal | ICD-10-CM

## 2023-09-15 MED ORDER — PEG-ELECTROLYTE SOLUTION 420 GRAM ORAL SOLUTION
Freq: Once | ORAL | 0 refills | 1.00 days | Status: CP
Start: 2023-09-15 — End: 2023-09-15

## 2023-09-15 NOTE — Unmapped (Addendum)
 Assessment and Plan  1. Generalized abdominal pain -further eval w abdo xray for stool burden,CT A/P(chest asw ell re small pulmonary nodule)  colonoscopy   2. Change in bowel habits -fiber ,water,may need mom,senna    3. Gastroesophageal reflux disease, unspecified whether esophagitis present -may benefit from regular use of ppi   4       RML pulmonary nodule,small -f/u CT chest.    Orders Placed This Encounter   Procedures    XR Abdomen 2 Views    CT Abdomen with contrast    CT chest with contrast    Colonoscopy   Procedures,prep risks and alternatives explained at length      Signs and symptoms that should prompt return sooner than scheduled were reviewed with the patient.  Visit time: In excess of  30  minutes, more than 50% of which was spent face-to-face in counseling and education activities regarding the diagnoses above.     HPI    Chief Complaint  Chief Complaint   Patient presents with    irregular bowel movement     Pt co ongoing irregular bm x 1 year (intermittent logs, pebbles and slime). Daily  dull Mid abdominal pain.      Kayla Knight is here for f/u gerd and problem w BM. Reflux better w prn use of omeprazole,once per day. Has not used famotdine.   BM too freq thru day. Occurs after and between meals. Does not wake from sleep but when up in night due to sleep apnea,recent cpap uses bathroom and will pass small amt stool No particular food as trigger. No straining. Does report felling of incomplete BM. Stool varies in consistency,harder smaller pellets and some mucus. No blood. Associated abdo pain lower abdo,right and left. Was previously just in afternoon,now more thru day upon waking in am.   Remote colonoscopy >10 yrs ago.    Allergies:  Penicillins, Lisinopril, Statins-hmg-coa reductase inhibitors, and Sulfa (sulfonamide antibiotics)    Medications:   Outpatient Medications Prior to Visit   Medication Sig Dispense Refill    amlodipine (NORVASC) 10 MG tablet Take 1 tablet (10 mg total) by mouth daily. 90 tablet 3    azithromycin (ZITHROMAX) 250 MG tablet Take 1 tablet (250 mg total) by mouth 3 (three) times a week. 40 tablet 0    clopidogrel (PLAVIX) 75 mg tablet TAKE 1 TABLET (75 MG TOTAL) BY MOUTH DAILY. 90 tablet 2    empty container (SHARPS-A-GATOR DISPOSAL SYSTEM) Misc Use as directed for sharps disposal 1 each 2    erythromycin (ROMYCIN) 5 mg/gram (0.5 %) ophthalmic ointment Administer to both eyes nightly. 3.5 g 3    evolocumab (REPATHA SURECLICK) 140 mg/mL PnIj Inject the contents of 1 pen (140 mg) under the skin every fourteen (14) days. 6 mL 3    famotidine (PEPCID) 20 MG tablet Take 1 tablet (20 mg total) by mouth two (2) times a day. 180 tablet 3    mag hydrox/aluminum hyd/simeth (ANTACID ANTI-GAS ORAL) Take by mouth.      omeprazole (PRILOSEC) 20 MG capsule Take 1 capsule (20 mg total) by mouth two (2) times a day. 180 capsule 2    spironolactone (ALDACTONE) 50 MG tablet Take 0.5 tablets (25 mg total) by mouth daily.      timolol (TIMOPTIC) 0.25 % ophthalmic solution Administer 1 drop to both eyes daily. 5 mL 5     No facility-administered medications prior to visit.       Medical History:  Past  Medical History:   Diagnosis Date    Anemia     past history, over 40 yrs ago    Cataract 2008    Surgery    Diabetes mellitus (CMS-HCC)     Type II, on 9/15 patient stated she was not longer diabetic, not taking meds    Dry eye     Hearing impairment     Heart murmur 2016    [facility]. 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    JOINT REPLACEMENT  Knee    KNEE CARTILAGE SURGERY Right 2007, 2008    x 2    KNEE SURGERY      pt reports total left knee joint replacement surgery around 2016, 2017    PLANTAR FASCIECTOMY Left     PR REVASCULARIZE FEM/POP ARTERY,ANGIOPLASTY/STENT N/A 01/29/2020    Procedure: Peripheral Angiography W Intervetion;  Surgeon: Alvira Philips, MD;  Location: [facility] CATH;  Service: Cardiology    PR REVASCULARIZE FEM/POP ARTERY,ANGIOPLASTY/STENT Right 12/26/2021    Procedure: right renal artery intervention;  Surgeon: Alvira Philips, MD;  Location: [facility] CATH;  Service: Cardiology    PR UPPER GI ENDOSCOPY,BIOPSY N/A 04/01/2021    Procedure: UGI ENDOSCOPY; WITH BIOPSY, SINGLE OR MULTIPLE;  Surgeon: Jarvis Morgan, MD;  Location: [facility];  Service: Gastroenterology    PR XCAPSL CTRC RMVL INSJ IO LENS PROSTH W/O ECP Right 02/13/2016    Procedure: EXTRACAPSULAR CATARACT REMOVAL W/INSERTION OF INTRAOCULAR LENS PROSTHESIS, MANUAL OR MECHANICAL TECHNIQUE;  Surgeon: Garey Ham, MD;  Location: [facility];  Service: Ophthalmology    PR XCAPSL CTRC RMVL INSJ IO LENS PROSTH W/O ECP Left 02/27/2016    Procedure: EXTRACAPSULAR CATARACT REMOVAL W/INSERTION OF INTRAOCULAR LENS PROSTHESIS, MANUAL OR MECHANICAL TECHNIQUE;  Surgeon: Garey Ham, MD;  Location: [facility];  Service: Ophthalmology    RENAL ARTERY STENT  01/11/2020    ROOT CANAL      TUBAL LIGATION  1973    [facility]    WISDOM TOOTH EXTRACTION         Social History:  Tobacco use:   reports that she quit smoking about 39 years ago. Her smoking use included cigarettes. She has been exposed to tobacco smoke. She has never used smokeless tobacco.  Alcohol use:   reports current alcohol use.  Drug use:  reports no history of drug use.      Review of Systems:  ROS:   General ROS: negative for - fever,weight loss  HEENT ROS: negative for sore throat, or rhinitis  Respiratory ROS: no cough, shortness of breath, or wheezing  Cardiovascular ROS: no chest  pain or dyspnea on exertion  Gastrointestinal ROS: no abdominal pain, change in bowel habits, or black or bloody stools  Genito-Urinary ROS: no dysuria or hematuria  Musculoskeletal ROS: negative for - new joint pain, muscle pain or muscular weakness   Dermatological ROS: negative for rash or new lesions  Neuro:no headaches      Vitals:    09/15/23 0808   BP: 160/70   BP Site: L Arm   BP Position: Sitting   BP Cuff Size: Medium   Pulse: 60   Temp: 36.6 ??C (97.9 ??F)   TempSrc: Oral   SpO2: 96%   Weight: 73.9 kg (163 lb)       Physical Exam  General: alert, oriented, no distress  EYES: Anicteric sclerae.  NECK: no lymphadenopathy,   RESP: Relaxed respiratory effort. Clear to auscultation without wheezes or crackles.    CV: Regular rate and rhythm. Normal S1 and S2. No murmurs or gallops.  Marland Kitchen   ABDO:soft,nontender,no hsm,no guarding or rebound,no distension  EXT:  No peripheral edema  Skin:no rash  Neuro:no focal deficits

## 2023-09-17 ENCOUNTER — Ambulatory Visit: Admit: 2023-09-17 | Discharge: 2023-09-17 | Disposition: A | Discharge status: Home / Self Care | Payer: MEDICARE

## 2023-09-17 ENCOUNTER — Emergency Department: Admit: 2023-09-17 | Discharge: 2023-09-17 | Disposition: A | Discharge status: Home / Self Care | Payer: MEDICARE

## 2023-09-17 ENCOUNTER — Ambulatory Visit: Admit: 2023-09-17 | Payer: MEDICARE

## 2023-09-17 DIAGNOSIS — K5900 Constipation, unspecified: Principal | ICD-10-CM

## 2023-09-17 LAB — CBC W/ AUTO DIFF
BASOPHILS ABSOLUTE COUNT: 0.1 10*9/L (ref 0.0–0.1)
BASOPHILS RELATIVE PERCENT: 0.9 %
EOSINOPHILS ABSOLUTE COUNT: 0 10*9/L (ref 0.0–0.5)
EOSINOPHILS RELATIVE PERCENT: 0.4 %
HEMATOCRIT: 39.9 % (ref 34.0–44.0)
HEMOGLOBIN: 13.3 g/dL (ref 11.3–14.9)
LYMPHOCYTES ABSOLUTE COUNT: 1.2 10*9/L (ref 1.1–3.6)
LYMPHOCYTES RELATIVE PERCENT: 15.2 %
MEAN CORPUSCULAR HEMOGLOBIN CONC: 33.4 g/dL (ref 32.0–36.0)
MEAN CORPUSCULAR HEMOGLOBIN: 25.2 pg — ABNORMAL LOW (ref 25.9–32.4)
MEAN CORPUSCULAR VOLUME: 75.4 fL — ABNORMAL LOW (ref 77.6–95.7)
MEAN PLATELET VOLUME: 6.6 fL — ABNORMAL LOW (ref 6.8–10.7)
MONOCYTES ABSOLUTE COUNT: 0.4 10*9/L (ref 0.3–0.8)
MONOCYTES RELATIVE PERCENT: 5.3 %
NEUTROPHILS ABSOLUTE COUNT: 6 10*9/L (ref 1.8–7.8)
NEUTROPHILS RELATIVE PERCENT: 78.2 %
NUCLEATED RED BLOOD CELLS: 0 /100{WBCs} (ref <=4)
PLATELET COUNT: 387 10*9/L (ref 150–450)
RED BLOOD CELL COUNT: 5.29 10*12/L — ABNORMAL HIGH (ref 3.95–5.13)
RED CELL DISTRIBUTION WIDTH: 15.9 % — ABNORMAL HIGH (ref 12.2–15.2)
WBC ADJUSTED: 7.7 10*9/L (ref 3.6–11.2)

## 2023-09-17 LAB — URINALYSIS WITH MICROSCOPY WITH CULTURE REFLEX PERFORMABLE
BACTERIA: NONE SEEN /HPF
BILIRUBIN UA: NEGATIVE
BLOOD UA: NEGATIVE
GLUCOSE UA: NEGATIVE
KETONES UA: NEGATIVE
NITRITE UA: NEGATIVE
PH UA: 8 (ref 5.0–9.0)
RBC UA: 9 /HPF — ABNORMAL HIGH (ref <=4)
SPECIFIC GRAVITY UA: 1.04 — ABNORMAL HIGH (ref 1.003–1.030)
SQUAMOUS EPITHELIAL: 3 /HPF (ref 0–5)
UROBILINOGEN UA: <2
WBC UA: 3 /HPF (ref 0–5)

## 2023-09-17 LAB — COMPREHENSIVE METABOLIC PANEL
ALBUMIN: 3.9 g/dL (ref 3.4–5.0)
ALKALINE PHOSPHATASE: 83 U/L (ref 46–116)
ALT (SGPT): 10 U/L (ref 10–49)
ANION GAP: 16 mmol/L — ABNORMAL HIGH (ref 5–14)
AST (SGOT): 26 U/L (ref <=34)
BILIRUBIN TOTAL: 0.5 mg/dL (ref 0.3–1.2)
BLOOD UREA NITROGEN: 17 mg/dL (ref 9–23)
BUN / CREAT RATIO: 19
CALCIUM: 9.9 mg/dL (ref 8.7–10.4)
CHLORIDE: 102 mmol/L (ref 98–107)
CO2: 23.5 mmol/L (ref 20.0–31.0)
CREATININE: 0.9 mg/dL (ref 0.55–1.02)
EGFR CKD-EPI (2021) FEMALE: 64 mL/min/{1.73_m2} (ref >=60)
GLUCOSE RANDOM: 121 mg/dL (ref 70–179)
POTASSIUM: 4.8 mmol/L (ref 3.4–4.8)
PROTEIN TOTAL: 8.4 g/dL — ABNORMAL HIGH (ref 5.7–8.2)
SODIUM: 141 mmol/L (ref 135–145)

## 2023-09-17 LAB — LIPASE: LIPASE: 38 U/L (ref 12–53)

## 2023-09-17 MED ORDER — DOCUSATE SODIUM 100 MG CAPSULE
ORAL_CAPSULE | Freq: Every day | ORAL | 0 refills | 30.00 days | Status: CP
Start: 2023-09-17 — End: 2023-10-17

## 2023-09-17 MED ORDER — POLYETHYLENE GLYCOL 3350 17 GRAM ORAL POWDER PACKET
PACK | Freq: Every day | ORAL | 0 refills | 30.00 days | Status: CP
Start: 2023-09-17 — End: 2023-10-17

## 2023-09-17 MED ADMIN — morphine injection 2 mg: 2 mg | INTRAVENOUS | @ 16:00:00 | Stop: 2023-09-17

## 2023-09-17 MED ADMIN — oxyCODONE (ROXICODONE) immediate release tablet 5 mg: 5 mg | ORAL | @ 21:00:00 | Stop: 2023-09-17

## 2023-09-17 MED ADMIN — morphine injection 2 mg: 2 mg | INTRAVENOUS | @ 18:00:00 | Stop: 2023-09-17

## 2023-09-17 MED ADMIN — iohexol (OMNIPAQUE) 350 mg iodine/mL solution 100 mL: 100 mL | INTRAVENOUS | @ 17:00:00 | Stop: 2023-09-17

## 2023-09-17 MED ADMIN — SMOG ENEMA: 240 mL | RECTAL | @ 19:00:00 | Stop: 2023-09-17

## 2023-09-17 MED ADMIN — ondansetron (ZOFRAN) injection 4 mg: 4 mg | INTRAVENOUS | @ 16:00:00 | Stop: 2023-09-17

## 2023-09-17 MED ADMIN — acetaminophen (TYLENOL) tablet 1,000 mg: 1000 mg | ORAL | @ 21:00:00 | Stop: 2023-09-17

## 2023-09-17 NOTE — Unmapped (Signed)
[facility]  Emergency Department Provider Note        ED Clinical Impression      Final diagnoses:   Constipation, unspecified constipation type (Primary)           Impression, ED Course, Assessment and Plan      Impression: Kayla Knight is a 82 y.o. female with PMH significant for T2DM, EMEA, MVR, idiopathic peripheral neuropathy, HTN, OA glaucoma who presents to the emergency department for LLQ abdominal pain.  Slightly hypertensive in triage, nontoxic in appearance.     Ddx includes constipation vs IBS vs diverticulitis vs less likely malignancy.  Less likely SBO however this was considered.  No urinary s/s to suggest cystitis.  Could consider less likely gastroenteritis, pancreatitis.  Will check basic labs including lipase, UA.  CXR and CTAP with contrast.  Morphine and Zofran provided for symptomatic control.    12:07 PM  Labs overall unremarkable compared to previous.  No significant leukocytosis, lipase normal.  CXR clear.  UA/CT pending.    1:18 PM  Urine has been provided and pending.  CT with extensive diverticulosis without any evidence of diverticulitis.  Moderate colonic stool burden.  Incidentally noted findings similar to previous of stable RLL pulmonary nodule, renal cysts.  Have discussed results of testing with patient and will give SMOG enema.     3:07 PM  Patient with large stool, mix of semiformed and small hard stool balls after enema.  Feels slightly improved however with recurrent pain.  Upon further discussion, she is requesting if we are going to address her vaginal infection.  Per further discussion, she has not been treated per a provider for her vaginal discharge and odor however has been taken OTC treatment for Candida vaginitis (vaginal cream).  Will send vaginitis screen.    5:08 PM  Speculum exam performed with nurse chaperone to the bedside (Angelica) overall unremarkable with normal exam with very scant discharge noted to the vaginal wall without any discharge pooling or malodor.  Vaginitis screen negative hence no additional workup indicated.  For her noted constipation, have recommended MiraLAX and Colace which were sent to the pharmacy, Senokot if needed for step up therapy OTC.  Fiber, hydration and rest.  PCP or GI follow-up if symptoms persist.  Strict reevaluation criteria were provided and patient verbalized understanding agreeable to discharge.     Additional Medical Decision Making     I have reviewed the vital signs and the nursing notes. Labs and radiology results that were available during my care of the patient were independently reviewed by me and considered in my medical decision making.     I directly visualized and independently interpreted the EKG tracing.   I independently visualized the radiology images.   I reviewed the patient's prior medical records.     Portions of this record have been created using Scientist, clinical (histocompatibility and immunogenetics). Dictation errors have been sought, but may not have been identified and corrected.  ____________________________________________         History        Chief Complaint  Abdominal Pain      HPI   Kayla Knight is a 82 y.o. female with PMH significant for T2DM, EMEA, MVR, idiopathic peripheral neuropathy, HTN, OA glaucoma who presents to the emergency department for abdominal pain.  Verbalizes chronic pain to the abdomen which became severe this morning.  Note, she did recently see GI for her abdominal pain 12/18 with abdominal x-ray with moderate colonic stool  burden.  They recommended outpatient CTAP, colonoscopy.  Excision when she woke this morning she called her daughter immediately as the pain was severe.  Pain is located to the left lower quadrant and radiates diffusely throughout the abdomen.  States that this has been ongoing for multiple months however never this severe.  Does note that she has irregular bowel movements with LBM yesterday, large and long, some hard some semiformed.  Also notes that she felt sob however states this has been ongoing, was recently started on CPAP 1wk ago and adjusting, unclear if related.  Has had more mucous production since CPAP initiated.  Not had any fevers, dysuria, hematuria, constipation, melena or hematochezia.  Denies any recent dietary changes or recreational drug use.  Does imbibe in occasional alcohol, not regularly.  No known hx diverticulitis.     Per chart review, last colonoscopy on file 2013 per GI normal.  She did have UGI in 2022 with mild esophagitis/gastritis and noted hiatal hernia.    Past Medical History:   Diagnosis Date    Anemia     past history, over 40 yrs ago    Cataract 2008    Surgery    Diabetes mellitus (CMS-HCC)     Type II, on 9/15 patient stated she was not longer diabetic, not taking meds    Dry eye     Hearing impairment     Heart murmur 2016    [facility]. Verdie Shire [facility]    Heart valve disease     Heart mumur, pt reports diagnosis 10 yrs ago (05/10/20)    History of stent insertion of renal artery 01/29/2020    ASA indefinitely and plavix x 6 months per procedure note    Hypertension     pt reports taking meds since about 1995    Neuromuscular disorder (CMS-HCC) 2014    Feet & Legs    Tear of meniscus of knee     4.5 yrs ago    Urinary incontinence     seen a doctor about nighttime bathroom use (adressed in 2019)       Patient Active Problem List   Diagnosis    Benign essential hypertension    Primary osteoarthritis of right knee    Idiopathic peripheral neuropathy    MR (mitral regurgitation)    Type 2 diabetes mellitus without complication, without long-term current use of insulin (CMS-HCC)    Neck pain    Encounter for subsequent annual wellness visit (AWV) in Medicare patient    Lipoma of torso    Skin lesion    Hearing loss    Lichen sclerosus et atrophicus of the vulva    Renal artery stenosis, native, bilateral (CMS-HCC)    Hyperlipidemia, unspecified    Aftercare following joint replacement    Presence of left artificial knee joint    Hypokalemia    History of chest pain    Decreased GFR    Nonrheumatic aortic valve insufficiency    Endometrial thickening on ultrasound    Weakness of both hips    Right hip pain    Hot flashes    Aortic valve disease    Post-nasal drip    Witnessed episode of apnea    Glaucoma suspect of both eyes    Dry eye syndrome, bilateral    PVD (posterior vitreous detachment), bilateral    S/P cataract extraction and insertion of intraocular lens, left    S/P cataract extraction and insertion of intraocular lens,  right    Meibomian gland dysfunction (MGD), bilateral, both upper and lower lids       Past Surgical History:   Procedure Laterality Date    CATARACT EXTRACTION W/ INTRAOCULAR LENS IMPLANT Left 02/27/2016    EYE SURGERY  01/30/16    Cataract surgery is scheduled.    JOINT REPLACEMENT  Knee    KNEE CARTILAGE SURGERY Right 2007, 2008    x 2    KNEE SURGERY      pt reports total left knee joint replacement surgery around 2016, 2017    PLANTAR FASCIECTOMY Left     PR REVASCULARIZE FEM/POP ARTERY,ANGIOPLASTY/STENT N/A 01/29/2020    Procedure: Peripheral Angiography W Intervetion;  Surgeon: Alvira Philips, MD;  Location: [facility] C[facility];  Service: Cardiology    PR REVASCULARIZE FEM/POP ARTERY,ANGIOPLASTY/STENT Right 12/26/2021    Procedure: right renal artery intervention;  Surgeon: Alvira Philips, MD;  Location: [facility] CATH;  Service: Cardiology    PR UPPER GI ENDOSCOPY,BIOPSY N/A 04/01/2021    Procedure: UGI ENDOSCOPY; WITH BIOPSY, SINGLE OR MULTIPLE;  Surgeon: Jarvis Morgan, MD;  Location: [facility];  Service: Gastroenterology    PR XCAPSL CTRC RMVL INSJ IO LENS PROSTH W/O ECP Right 02/13/2016    Procedure: EXTRACAPSULAR CATARACT REMOVAL W/INSERTION OF INTRAOCULAR LENS PROSTHESIS, MANUAL OR MECHANICAL TECHNIQUE;  Surgeon: Garey Ham, MD;  Location: [facility];  Service: Ophthalmology    PR XCAPSL CTRC RMVL INSJ IO LENS PROSTH W/O ECP Left 02/27/2016 Procedure: EXTRACAPSULAR CATARACT REMOVAL W/INSERTION OF INTRAOCULAR LENS PROSTHESIS, MANUAL OR MECHANICAL TECHNIQUE;  Surgeon: Garey Ham, MD;  Location: [facility] OR [facility];  Service: Ophthalmology    RENAL ARTERY STENT  01/11/2020    ROOT CANAL      TUBAL LIGATION  1973    [facility]    WISDOM TOOTH EXTRACTION         No current facility-administered medications for this encounter.    Current Outpatient Medications:     amlodipine (NORVASC) 10 MG tablet, Take 1 tablet (10 mg total) by mouth daily., Disp: 90 tablet, Rfl: 3    azithromycin (ZITHROMAX) 250 MG tablet, Take 1 tablet (250 mg total) by mouth 3 (three) times a week., Disp: 40 tablet, Rfl: 0    clopidogrel (PLAVIX) 75 mg tablet, TAKE 1 TABLET (75 MG TOTAL) BY MOUTH DAILY., Disp: 90 tablet, Rfl: 2    docusate sodium (COLACE) 100 MG capsule, Take 1 capsule (100 mg total) by mouth daily., Disp: 30 capsule, Rfl: 0    empty container (SHARPS-A-GATOR DISPOSAL SYSTEM) Misc, Use as directed for sharps disposal, Disp: 1 each, Rfl: 2    erythromycin (ROMYCIN) 5 mg/gram (0.5 %) ophthalmic ointment, Administer to both eyes nightly., Disp: 3.5 g, Rfl: 3    evolocumab (REPATHA SURECLICK) 140 mg/mL PnIj, Inject the contents of 1 pen (140 mg) under the skin every fourteen (14) days., Disp: 6 mL, Rfl: 3    famotidine (PEPCID) 20 MG tablet, Take 1 tablet (20 mg total) by mouth two (2) times a day., Disp: 180 tablet, Rfl: 3    mag hydrox/aluminum hyd/simeth (ANTACID ANTI-GAS ORAL), Take by mouth., Disp: , Rfl:     omeprazole (PRILOSEC) 20 MG capsule, Take 1 capsule (20 mg total) by mouth two (2) times a day., Disp: 180 capsule, Rfl: 2    polyethylene glycol (MIRALAX) 17 gram packet, Take 17 g by mouth daily., Disp: 30 packet, Rfl: 0    spironolactone (ALDACTONE) 50 MG tablet, Take  0.5 tablets (25 mg total) by mouth daily., Disp: , Rfl:     timolol (TIMOPTIC) 0.25 % ophthalmic solution, Administer 1 drop to both eyes daily., Disp: 5 mL, Rfl: 5    Allergies  Penicillins, Lisinopril, Statins-hmg-coa reductase inhibitors, and Sulfa (sulfonamide antibiotics)    Family History   Problem Relation Age of Onset    Heart disease Mother     Hypertension Mother     Dementia Mother     Arthritis Mother     Heart disease Father     Thyroid disease Father     Glaucoma Father     Alcohol abuse Father     Heart attack Father 89    Arthritis Father     Hypertension Father         Massive heart attack    Arthritis Sister     Glaucoma Sister     Hypertension Sister     Diabetes Sister     Diabetes Daughter     Diabetes Paternal Aunt     No Known Problems Brother     No Known Problems Maternal Aunt     No Known Problems Maternal Uncle     No Known Problems Paternal Uncle     No Known Problems Maternal Grandmother     No Known Problems Maternal Grandfather     No Known Problems Paternal Grandmother     No Known Problems Paternal Grandfather     Arthritis Sister         Noticeable in extremities    COPD Sister     Depression Sister     Hypertension Sister     Glaucoma Sister     Asthma Daughter         Ocassional    Hypertension Daughter     Miscarriages / Stillbirths Daughter     Cancer Maternal Aunt         Died more than 16yrs ago    Early death Maternal Aunt     Diabetes Paternal Aunt         4 Aunts    Kidney disease Paternal Aunt     Amblyopia Neg Hx     Blindness Neg Hx     Cancer Neg Hx     Cataracts Neg Hx     Macular degeneration Neg Hx     Retinal detachment Neg Hx     Strabismus Neg Hx     Stroke Neg Hx        Social History  Social History     Tobacco Use    Smoking status: Former     Current packs/day: 0.00     Types: Cigarettes     Quit date: 05/19/1984     Years since quitting: 39.3     Passive exposure: Past    Smokeless tobacco: Never   Vaping Use    Vaping status: Never Used   Substance Use Topics    Alcohol use: Yes     Comment: occassional    Drug use: Never        Physical Exam     This provider entered the patient's room: Yes:    If this provider did not enter the room, a comprehensive physical exam was not able to be performed due to increased infection risk to themselves, other providers, staff and other patients), as well as to conserve personal protective equipment (PPE) utilization during the COVID-19 pandemic.    If this provider did enter the  patient room, the following was PPE worn: Surgical mask, eye protection and gloves    ED Triage Vitals [09/17/23 1047]   Enc Vitals Group      BP 159/61      Heart Rate 61      SpO2 Pulse       Resp 19      Temp 36.2 ??C (97.1 ??F)      Temp Source Skin      SpO2 99 %      Weight 73.9 kg (163 lb)     Constitutional: Alert and oriented. Well appearing and in no distress.  Eyes: Conjunctivae are normal.  Cardiovascular: Normal rate, regular rhythm. Systolic murmur 2/6 auscultated loudest at the left 2nd ICS. No rubs or gallops.  Respiratory: Normal respiratory effort. Breath sounds are normal in all lobes.  Gastrointestinal: Soft and distended.  Audible bowel sounds in all 4 quadrants.  Mild TTP to the lateral LLQ with no significant rebound or guarding.  No CVA TTP.  Neurologic: Normal speech and language. No gross focal neurologic deficits are appreciated.  GCS 15.  Skin: Skin is warm, dry and intact. No rash noted.  Psychiatric: Mood and affect are normal. Speech and behavior are normal.     EKG     Normal rate, regular rhythm, 67 bpm w/ 1st degree AVB  Left axis deviation  Normal intervals excluding prolonged PR  Twave inversion in V6  No significant ST elevation or depression from baseline     Radiology     CT Abdomen Pelvis W IV Contrast Only   Final Result   Extensive colonic diverticulosis without diverticulitis. No CT evidence of acute findings in the abdomen and pelvis.      Moderate colonic stool burden.      Stable right lower lobe pulmonary nodule measuring up to 0.9 cm compared to 2021 CT and therefore is likely benign.       Additional chronic and incidental findings, as described above.         XR Chest 2 views   Final Result      No acute cardiopulmonary abnormalities.             Procedures     N/a           Chrissie Noa, FNP  09/17/23 1714

## 2023-09-17 NOTE — Unmapped (Signed)
The patient reports experiencing pain in the lower left quadrant for several months, along with nausea and vomiting yellow phlegm since this morning. She mentions recently starting CPAP  about a week ago.

## 2023-09-18 ENCOUNTER — Ambulatory Visit: Admit: 2023-09-18 | Discharge: 2023-09-18 | Disposition: A | Discharge status: Home / Self Care | Payer: MEDICARE

## 2023-09-18 DIAGNOSIS — R1032 Left lower quadrant pain: Principal | ICD-10-CM

## 2023-09-18 DIAGNOSIS — K5900 Constipation, unspecified: Principal | ICD-10-CM

## 2023-09-18 DIAGNOSIS — K5790 Diverticulosis of intestine, part unspecified, without perforation or abscess without bleeding: Principal | ICD-10-CM

## 2023-09-18 DIAGNOSIS — K921 Melena: Principal | ICD-10-CM

## 2023-09-18 LAB — CBC W/ AUTO DIFF
BASOPHILS ABSOLUTE COUNT: 0 10*9/L (ref 0.0–0.1)
BASOPHILS RELATIVE PERCENT: 0.5 %
EOSINOPHILS ABSOLUTE COUNT: 0 10*9/L (ref 0.0–0.5)
EOSINOPHILS RELATIVE PERCENT: 0.4 %
HEMATOCRIT: 40.4 % (ref 34.0–44.0)
HEMOGLOBIN: 13.3 g/dL (ref 11.3–14.9)
LYMPHOCYTES ABSOLUTE COUNT: 1.6 10*9/L (ref 1.1–3.6)
LYMPHOCYTES RELATIVE PERCENT: 22.7 %
MEAN CORPUSCULAR HEMOGLOBIN CONC: 32.8 g/dL (ref 32.0–36.0)
MEAN CORPUSCULAR HEMOGLOBIN: 24.9 pg — ABNORMAL LOW (ref 25.9–32.4)
MEAN CORPUSCULAR VOLUME: 75.9 fL — ABNORMAL LOW (ref 77.6–95.7)
MEAN PLATELET VOLUME: 6.5 fL — ABNORMAL LOW (ref 6.8–10.7)
MONOCYTES ABSOLUTE COUNT: 0.5 10*9/L (ref 0.3–0.8)
MONOCYTES RELATIVE PERCENT: 7 %
NEUTROPHILS ABSOLUTE COUNT: 5 10*9/L (ref 1.8–7.8)
NEUTROPHILS RELATIVE PERCENT: 69.4 %
NUCLEATED RED BLOOD CELLS: 0 /100{WBCs} (ref <=4)
PLATELET COUNT: 385 10*9/L (ref 150–450)
RED BLOOD CELL COUNT: 5.33 10*12/L — ABNORMAL HIGH (ref 3.95–5.13)
RED CELL DISTRIBUTION WIDTH: 15.8 % — ABNORMAL HIGH (ref 12.2–15.2)
WBC ADJUSTED: 7.3 10*9/L (ref 3.6–11.2)

## 2023-09-18 LAB — COMPREHENSIVE METABOLIC PANEL
ALBUMIN: 3.8 g/dL (ref 3.4–5.0)
ALKALINE PHOSPHATASE: 79 U/L (ref 46–116)
ALT (SGPT): 9 U/L — ABNORMAL LOW (ref 10–49)
ANION GAP: 11 mmol/L (ref 5–14)
AST (SGOT): 20 U/L (ref <=34)
BILIRUBIN TOTAL: 0.5 mg/dL (ref 0.3–1.2)
BLOOD UREA NITROGEN: 16 mg/dL (ref 9–23)
BUN / CREAT RATIO: 15
CALCIUM: 9.9 mg/dL (ref 8.7–10.4)
CHLORIDE: 101 mmol/L (ref 98–107)
CO2: 26.2 mmol/L (ref 20.0–31.0)
CREATININE: 1.08 mg/dL — ABNORMAL HIGH (ref 0.55–1.02)
EGFR CKD-EPI (2021) FEMALE: 52 mL/min/{1.73_m2} — ABNORMAL LOW (ref >=60)
GLUCOSE RANDOM: 113 mg/dL (ref 70–179)
POTASSIUM: 4.7 mmol/L (ref 3.4–4.8)
PROTEIN TOTAL: 8.1 g/dL (ref 5.7–8.2)
SODIUM: 138 mmol/L (ref 135–145)

## 2023-09-18 MED ORDER — DICYCLOMINE 20 MG TABLET
ORAL_TABLET | Freq: Every day | ORAL | 0 refills | 10.00 days | Status: CP | PRN
Start: 2023-09-18 — End: 2023-09-28

## 2023-09-18 NOTE — Unmapped (Signed)
Pt was seen here yesterday for pain to LLQ and n/v. She reports blood in stool x 2 today.

## 2023-09-18 NOTE — Unmapped (Signed)
[facility]  Emergency Department Provider Note     HPI     Kayla Knight is a very pleasant 82 y.o. female with a past medical history of T2DM, EMEA, MVR, idiopathic peripheral neuropathy, HTN, OA glaucoma who presents with bloody stools. The patient reports 2 episodes of bloody stool that were gelatinous in texture in the setting of months of constant left-sided abdominal pain. She presented to the ED yesterday for left lower quadrant abdominal pain, where a CT A/P revealed extensive colonic diverticulosis without diverticulitis, moderate colonic stool burden, and stable right lower lobe pulmonary nodule measuring up to 0.9 cm. She had an enema yesterday in the ED with large stool, mix of semiformed and small hard stool balls. She reports no history of bloody stools. She reports she has regular bowel movements at baseline.  Denies lightheadedness or palpitations.       MDM     BP 181/77  - Pulse 64  - Temp 36.8 ??C (98.2 ??F) (Oral)  - Resp 16  - Wt 73.9 kg (163 lb)  - SpO2 98%  - BMI 26.31 kg/m??      Hemodynamically stable. On exam, patient is overall well-appearing in no acute distress.  No rubs murmurs or gallops on cardiac auscultation, lungs are CTA bilaterally, abdomen is soft, nontender, 2+ pulses in distal extremities, no leg swelling. Ddx: Abdominal exam without evidence of acute abdomen at this time. Well appearing. Given work up, low suspicion for acute hepatobiliary disease including acute cholecystitis or cholangitis, PUD, acute infectious processes like pneumonia, hepatitis, pyelonephritis, acute appendicitis, vascular catastrophe, bowel obstruction, viscus perforation, expected her pain is most likely secondary to diverticulitis/diverticulosis constipation.  Expect bowel movements are likely secondary to patient's enema yesterday versus diverticular bleeding.  Diagnostic workup as below. Disposition pending further work-up. See progress notes below.     Orders Placed This Encounter   Procedures CBC w/ Differential    Comprehensive Metabolic Panel    POC Guaiac Fecal Occult Blood Test (FOBT) X 1       ED Course as of 09/18/23 1536   Sat Sep 18, 2023   1424 HGB: 13.3   1424 CBC without actionable findings   1427 CMP without actionable findings.   1456 Review of patient's CT imaging from yesterday, shows extensive diverticulosis, without significant stool burden, patient did have an enema yesterday, expect that her 2 episodes of bloody bowel movements are more likely secondary to the enema versus diverticular bleed, without significant change in her hemoglobin level, no significant changes in her hemodynamic status, I do believe that is appropriate for discharge, discussed with the patient a regimen for MiraLAX, Bentyl, Tylenol, provided with return precautions and recommended that she follow-up with her PCP.  She and her daughter both voiced understanding and agreement with plan.       The case was discussed with the attending physician who is in agreement with the above assessment and plan.    - Any discussion of this patient's case/presentation between myself and consultants, admitting teams, or other team members has been documented above.  - Imaging and other studies, if performed, that were available during my care of the patient were independently reviewed and interpreted by me and considered in my medical decision making as documented above.  - External records reviewed: 09/17/2023 [facility] ED Provider Note  - Consideration of admission, observation, transfer, or escalation of care: Yes, however patient was deemed appropriate for outpatient management, please see ED course and MDM for further  clinical reasoning.     ED Clinical Impression     Final diagnoses:   Left lower quadrant abdominal pain (Primary)   Constipation, unspecified constipation type   Diverticulosis   Bloody stool        Physical Exam     Vitals:    09/18/23 1230 09/18/23 1527   BP: 152/73 181/77   Pulse: 57 64   Resp: 16 16   Temp: 36.8 ??C (98.2 ??F)    TempSrc: Oral    SpO2: 99% 98%   Weight: 73.9 kg (163 lb)         Constitutional: In no acute distress.  Eyes: Conjunctivae are normal. EOMI.   HEENT: Normocephalic and atraumatic. Mucous membranes are moist.   Neck: Full active range of motion  Cardiovascular: See heart rate listed above.  No murmurs appreciated.  Normal skin perfusion.  2+ pulses in distal extremities, no lower extremity edema.   Respiratory: See respiratory rate listed above.  Lungs are clear to auscultation bilaterally.  Speaking easily in full sentences  Gastrointestinal: Soft, non-distended, non-tender.  No guarding.  Musculoskeletal: No long bone deformities.   Neurologic: Normal speech and language. No gross focal neurologic deficits are appreciated.   Skin: Skin is warm, dry and intact.  Psychiatric: Mood and affect are normal.     Past History     PAST MEDICAL HISTORY/PAST SURGICAL HISTORY:   Past Medical History:   Diagnosis Date    Anemia     past history, over 40 yrs ago    Cataract 2008    Surgery    Diabetes mellitus (CMS-HCC)     Type II, on 9/15 patient stated she was not longer diabetic, not taking meds    Dry eye     Hearing impairment     Heart murmur 2016    Dr. Verdie Shire @ [facility]    Heart valve disease     Heart mumur, pt reports diagnosis 10 yrs ago (05/10/20)    History of stent insertion of renal artery 01/29/2020    ASA indefinitely and plavix x 6 months per procedure note    Hypertension     pt reports taking meds since about 1995    Neuromuscular disorder (CMS-HCC) 2014    Feet & Legs    Tear of meniscus of knee     4.5 yrs ago    Urinary incontinence     seen a doctor about nighttime bathroom use (adressed in 2019)       Past Surgical History:   Procedure Laterality Date    CATARACT EXTRACTION W/ INTRAOCULAR LENS IMPLANT Left 02/27/2016    EYE SURGERY  01/30/16    Cataract surgery is scheduled.    JOINT REPLACEMENT  Knee    KNEE CARTILAGE SURGERY Right 2007, 2008    x 2    KNEE SURGERY      pt reports total left knee joint replacement surgery around 2016, 2017    PLANTAR FASCIECTOMY Left     PR REVASCULARIZE FEM/POP ARTERY,ANGIOPLASTY/STENT N/A 01/29/2020    Procedure: Peripheral Angiography W Intervetion;  Surgeon: Alvira Philips, MD;  Location: [facility] CATH;  Service: Cardiology    PR REVASCULARIZE FEM/POP ARTERY,ANGIOPLASTY/STENT Right 12/26/2021    Procedure: right renal artery intervention;  Surgeon: Alvira Philips, MD;  Location: [facility] CATH;  Service: Cardiology    PR UPPER GI ENDOSCOPY,BIOPSY N/A 04/01/2021    Procedure: UGI ENDOSCOPY; WITH BIOPSY, SINGLE OR MULTIPLE;  Surgeon: Stevphen Rochester  Lolita Lenz, MD;  Location: [facility]ED[facility];  Service: Gastroenterology    PR XCAPSL CTRC RMVL INSJ IO LENS PROSTH W/O ECP Right 02/13/2016    Procedure: EXTRACAPSULAR CATARACT REMOVAL W/INSERTION OF INTRAOCULAR LENS PROSTHESIS, MANUAL OR MECHANICAL TECHNIQUE;  Surgeon: Garey Ham, MD;  Location: [facility];  Service: Ophthalmology    PR XCAPSL CTRC RMVL INSJ IO LENS PROSTH W/O ECP Left 02/27/2016    Procedure: EXTRACAPSULAR CATARACT REMOVAL W/INSERTION OF INTRAOCULAR LENS PROSTHESIS, MANUAL OR MECHANICAL TECHNIQUE;  Surgeon: Garey Ham, MD;  Location: [facility];  Service: Ophthalmology    RENAL ARTERY STENT  01/11/2020    ROOT CANAL      TUBAL LIGATION  1973    [facility]    WISDOM TOOTH EXTRACTION         MEDICATIONS:   No current facility-administered medications for this encounter.    Current Outpatient Medications:     amlodipine (NORVASC) 10 MG tablet, Take 1 tablet (10 mg total) by mouth daily., Disp: 90 tablet, Rfl: 3    azithromycin (ZITHROMAX) 250 MG tablet, Take 1 tablet (250 mg total) by mouth 3 (three) times a week., Disp: 40 tablet, Rfl: 0    clopidogrel (PLAVIX) 75 mg tablet, TAKE 1 TABLET (75 MG TOTAL) BY MOUTH DAILY., Disp: 90 tablet, Rfl: 2    dicyclomine (BENTYL) 20 mg tablet, Take 1 tablet (20 mg total) by mouth daily as needed for up to 10 days., Disp: 10 tablet, Rfl: 0    docusate sodium (COLACE) 100 MG capsule, Take 1 capsule (100 mg total) by mouth daily., Disp: 30 capsule, Rfl: 0    empty container (SHARPS-A-GATOR DISPOSAL SYSTEM) Misc, Use as directed for sharps disposal, Disp: 1 each, Rfl: 2    erythromycin (ROMYCIN) 5 mg/gram (0.5 %) ophthalmic ointment, Administer to both eyes nightly., Disp: 3.5 g, Rfl: 3    evolocumab (REPATHA SURECLICK) 140 mg/mL PnIj, Inject the contents of 1 pen (140 mg) under the skin every fourteen (14) days., Disp: 6 mL, Rfl: 3    famotidine (PEPCID) 20 MG tablet, Take 1 tablet (20 mg total) by mouth two (2) times a day., Disp: 180 tablet, Rfl: 3    mag hydrox/aluminum hyd/simeth (ANTACID ANTI-GAS ORAL), Take by mouth., Disp: , Rfl:     omeprazole (PRILOSEC) 20 MG capsule, Take 1 capsule (20 mg total) by mouth two (2) times a day., Disp: 180 capsule, Rfl: 2    polyethylene glycol (MIRALAX) 17 gram packet, Take 17 g by mouth daily., Disp: 30 packet, Rfl: 0    spironolactone (ALDACTONE) 50 MG tablet, Take 0.5 tablets (25 mg total) by mouth daily., Disp: , Rfl:     timolol (TIMOPTIC) 0.25 % ophthalmic solution, Administer 1 drop to both eyes daily., Disp: 5 mL, Rfl: 5    ALLERGIES:   Penicillins, Lisinopril, Statins-hmg-coa reductase inhibitors, and Sulfa (sulfonamide antibiotics)    SOCIAL HISTORY:   Social History     Tobacco Use    Smoking status: Former     Current packs/day: 0.00     Types: Cigarettes     Quit date: 05/19/1984     Years since quitting: 39.3     Passive exposure: Past    Smokeless tobacco: Never   Substance Use Topics    Alcohol use: Yes     Comment: occassional       FAMILY HISTORY:  Family History   Problem Relation Age of Onset    Heart disease Mother  Hypertension Mother     Dementia Mother     Arthritis Mother     Heart disease Father     Thyroid disease Father     Glaucoma Father     Alcohol abuse Father     Heart attack Father 52    Arthritis Father     Hypertension Father Massive heart attack    Arthritis Sister     Glaucoma Sister     Hypertension Sister     Diabetes Sister     Diabetes Daughter     Diabetes Paternal Aunt     No Known Problems Brother     No Known Problems Maternal Aunt     No Known Problems Maternal Uncle     No Known Problems Paternal Uncle     No Known Problems Maternal Grandmother     No Known Problems Maternal Grandfather     No Known Problems Paternal Grandmother     No Known Problems Paternal Grandfather     Arthritis Sister         Noticeable in extremities    COPD Sister     Depression Sister     Hypertension Sister     Glaucoma Sister     Asthma Daughter         Ocassional    Hypertension Daughter     Miscarriages / Stillbirths Daughter     Cancer Maternal Aunt         Died more than 27yrs ago    Early death Maternal Aunt     Diabetes Paternal Aunt         4 Aunts    Kidney disease Paternal Aunt     Amblyopia Neg Hx     Blindness Neg Hx     Cancer Neg Hx     Cataracts Neg Hx     Macular degeneration Neg Hx     Retinal detachment Neg Hx     Strabismus Neg Hx     Stroke Neg Hx          Radiology     No orders to display        Laboratory Data     Lab Results   Component Value Date    WBC 7.3 09/18/2023    HGB 13.3 09/18/2023    HCT 40.4 09/18/2023    PLT 385 09/18/2023       Lab Results   Component Value Date    NA 138 09/18/2023    K 4.7 09/18/2023    CL 101 09/18/2023    CO2 26.2 09/18/2023    BUN 16 09/18/2023    CREATININE 1.08 (H) 09/18/2023    GLU 113 09/18/2023    CALCIUM 9.9 09/18/2023    MG 2.0 11/13/2019       Lab Results   Component Value Date    BILITOT 0.5 09/18/2023    PROT 8.1 09/18/2023    ALBUMIN 3.8 09/18/2023    ALT 9 (L) 09/18/2023    AST 20 09/18/2023    ALKPHOS 79 09/18/2023       Lab Results   Component Value Date    INR 0.91 08/13/2018    APTT 34.1 08/13/2018       Hollie Beach, DO  PGY2 EM    Portions of this record have been created using Scientist, clinical (histocompatibility and immunogenetics). Dictation errors have been sought, but may not have been identified and corrected.    Documentation assistance was provided by Amil Amen  Bevely Palmer, on September 18, 2023 at 2:14 PM for Hollie Beach, DO.    Documentation assistance was provided by the scribe in my presence.  The documentation recorded by the scribe has been reviewed by me and accurately reflects the services I personally performed. Edits were made as necessary.    Hollie Beach, DO       Alan Ripper, [place]  Resident  09/18/23 [phone]

## 2023-09-20 NOTE — Unmapped (Signed)
Urine Culture  Order: [id number] - Reflex for Order [id number]   Status: Final result      Clean Catch; Urine  Urine Culture, Comprehensive   10,000 to 50,000 CFU/mL Streptococcus agalactiae (group b) .  10,000 to 50,000 CFU/mL Mixed Urogenital Flora      Patient with a positive urine culture. Patient was not started on antibiotics prior to dispo from ED. EMAP messaged with results.

## 2023-09-21 NOTE — Unmapped (Signed)
[facility] ED Post-Discharge Call    Multiple ED visits this month    Contacted patient at [phone] to discuss ED visit. Confirmed patient identity.     We want to ensure you understood your plan of care. Review AVS instructions. Did your discharge instructions answer all of your questions? yes  Have you made a follow-up appointment? yes  Have you filled your prescriptions (if applicable)? Yes  Do you have any questions regarding your prescriptions?No  Do you have any other questions? No  Return precautions discussed.

## 2023-09-21 NOTE — Unmapped (Signed)
I called and spoke with patient regarding urine culture. I asked patient if she was experiencing any urinary symptoms including dysuria, urinary hesitancy/urgency/frequency or focal suprapubic pain. Patient denying any symptoms at this time. Patient encouraged to follow-up with her PCP. Return precautions reviewed. No further questions.

## 2023-09-21 NOTE — Unmapped (Signed)
EMAP test result follow-up note    September 20, 2023 8:04 PM     I was contacted by the ED resource nurse Mariane Duval) with regard to [facility]. She was seen in the [facility] Emergency Department on 09/17/23 at which time a clean catch urine culture was done which has returned positive for:    Urine Culture, Comprehensive 10,000 to 50,000 CFU/mL Streptococcus agalactiae (group b) - Abnormal    Group B Strep are susceptible to ampicillin and penicillin.  Please consult the Microbiology Lab [phone]) if  further susceptibility testing is needed.   10,000 to 50,000 CFU/mL Mixed Urogenital Flora - Abnormal       Specimen Source: Clean Catch        Resulting Agency: [facility]          Specimen Collected: 09/17/23 13:12 Last Resulted: 09/18/23 17:32     I have reviewed the patient's chart from this ED visit.  She is a 82 year old female who was evaluated for abdominal pain.  She denied any UTI symptoms including fever, dysuria or hematuria.  Urinalysis done as part of this evaluation showed small leukocyte esterase, negative nitrite, 3 WBC, 9 RBC, 3 squamous epithelial cells and no bacteria.  No antibiotics were given either during the patient's ED stay or at the time of discharge.  Urine culture has since returned with the findings as documented above.    Given the lack of any documented urinary symptoms and growth of multiple common skin/urogenital flora contaminants with bacterial colony count < 100,000 CFU/mL, clinical picture is suggestive of a contaminant and the patient may not require antibiotic treatment.     The ED resource nurse will contact the patient or their representative, inform them of this test result and reassess if she has had any urinary symptoms including dysuria, urinary hesitancy/urgency/frequency or focal suprapubic pain. If she has had any of these symptoms, she will be instructed to follow-up with her primary care doctor, urgent care or return to the emergency department for repeat urinalysis and culture. Otherwise, return precautions should be reviewed and she should be instructed to follow-up as directed at the time of her ED visit.

## 2023-09-24 ENCOUNTER — Ambulatory Visit: Admit: 2023-09-24 | Discharge: 2023-09-25 | Payer: MEDICARE | Attending: Internal Medicine | Primary: Internal Medicine

## 2023-09-24 DIAGNOSIS — K5900 Constipation, unspecified: Principal | ICD-10-CM

## 2023-09-24 DIAGNOSIS — R911 Solitary pulmonary nodule: Principal | ICD-10-CM

## 2023-09-24 NOTE — Unmapped (Signed)
Assessment and Plan      1. Constipation, unspecified constipation type    2. Nodule of right lung        Discussed treatment options in detail. Follow up prn.    Reviewed results, namely stable right lower lobe nodule also noted on 2021 CT.  No further follow up is indicated as lesion has been stable for > 2 years.  Follow up prn.      No problem-specific Assessment & Plan notes found for this encounter.      No orders of the defined types were placed in this encounter.      Requested Prescriptions      No prescriptions requested or ordered in this encounter       There are no discontinued medications.    Return for Next scheduled follow up.    Signs and symptoms that should prompt return sooner than scheduled were reviewed with the patient.      [facility] Components:     Medication adherence and barriers to the treatment plan have been addressed. Opportunities to optimize healthy behaviors have been discussed. Patient / caregiver voiced understanding.        HPI      Chief Complaint    Chief Complaint   Patient presents with    Follow-up       Kayla Knight presents for evaluation and management of the following concern(s):      Seen at urgent care 12/7, 12/9 for evaluation of URI symptoms and left calf pain, respectively.  D dimer was elevated.  Bilateral lower extremities duplex ultrasound were negative.      She was seen in  ED 12/20 and 12/21 with GI concerns:  In ED  12/20 with nausea, left lower quadrant pain.  Labs were unremarkable.  CT-scan of abdomen and pelvis showed diverticulosis without diverticulitis.  Bowel regimen was recommended and enema administered.  She presented on 12/21 with bright red rectal bleeding x 2.  Hemoglobin was OK.  She consumed only a liquid diet for three days.  She is looking at going vegan.     Health Maintenance reviewed - no intervention today      I have reviewed the patient's medical history in detail and updated the computerized patient record.          Patient Active Problem List   Diagnosis    Benign essential hypertension    Primary osteoarthritis of right knee    Idiopathic peripheral neuropathy    MR (mitral regurgitation)    Type 2 diabetes mellitus without complication, without long-term current use of insulin (CMS-HCC)    Neck pain    Encounter for subsequent annual wellness visit (AWV) in Medicare patient    Lipoma of torso    Skin lesion    Hearing loss    Lichen sclerosus et atrophicus of the vulva    Renal artery stenosis, native, bilateral (CMS-HCC)    Hyperlipidemia, unspecified    Aftercare following joint replacement    Presence of left artificial knee joint    Hypokalemia    History of chest pain    Decreased GFR    Nonrheumatic aortic valve insufficiency    Endometrial thickening on ultrasound    Weakness of both hips    Right hip pain    Hot flashes    Aortic valve disease    Post-nasal drip    Witnessed episode of apnea    Glaucoma suspect of both eyes    Dry  eye syndrome, bilateral    PVD (posterior vitreous detachment), bilateral    S/P cataract extraction and insertion of intraocular lens, left    S/P cataract extraction and insertion of intraocular lens, right    Meibomian gland dysfunction (MGD), bilateral, both upper and lower lids         Allergies:  Penicillins, Lisinopril, Statins-hmg-coa reductase inhibitors, and Sulfa (sulfonamide antibiotics)    Medications:   Outpatient Medications Prior to Visit   Medication Sig Dispense Refill    amlodipine (NORVASC) 10 MG tablet Take 1 tablet (10 mg total) by mouth daily. 90 tablet 3    azithromycin (ZITHROMAX) 250 MG tablet Take 1 tablet (250 mg total) by mouth 3 (three) times a week. 40 tablet 0    clopidogrel (PLAVIX) 75 mg tablet TAKE 1 TABLET (75 MG TOTAL) BY MOUTH DAILY. 90 tablet 2    dicyclomine (BENTYL) 20 mg tablet Take 1 tablet (20 mg total) by mouth daily as needed for up to 10 days. 10 tablet 0    docusate sodium (COLACE) 100 MG capsule Take 1 capsule (100 mg total) by mouth daily. 30 capsule 0    empty container (SHARPS-A-GATOR DISPOSAL SYSTEM) Misc Use as directed for sharps disposal 1 each 2    erythromycin (ROMYCIN) 5 mg/gram (0.5 %) ophthalmic ointment Administer to both eyes nightly. 3.5 g 3    evolocumab (REPATHA SURECLICK) 140 mg/mL PnIj Inject the contents of 1 pen (140 mg) under the skin every fourteen (14) days. 6 mL 3    famotidine (PEPCID) 20 MG tablet Take 1 tablet (20 mg total) by mouth two (2) times a day. 180 tablet 3    mag hydrox/aluminum hyd/simeth (ANTACID ANTI-GAS ORAL) Take by mouth.      omeprazole (PRILOSEC) 20 MG capsule Take 1 capsule (20 mg total) by mouth two (2) times a day. 180 capsule 2    polyethylene glycol (MIRALAX) 17 gram packet Take 17 g by mouth daily. 30 packet 0    timolol (TIMOPTIC) 0.25 % ophthalmic solution Administer 1 drop to both eyes daily. 5 mL 5    spironolactone (ALDACTONE) 50 MG tablet Take 0.5 tablets (25 mg total) by mouth daily.       No facility-administered medications prior to visit.       Medical History:  Past Medical History:   Diagnosis Date    Anemia     past history, over 40 yrs ago    Cataract 2008    Surgery    Diabetes mellitus (CMS-HCC)     Type II, on 9/15 patient stated she was not longer diabetic, not taking meds    Dry eye     Hearing impairment     Heart murmur 2016    [facility] Verdie [facility]    Heart valve disease     Heart mumur, pt reports diagnosis 10 yrs ago (05/10/20)    History of stent insertion of renal artery 01/29/2020    ASA indefinitely and plavix x 6 months per procedure note    Hypertension     pt reports taking meds since about 1995    Neuromuscular disorder (CMS-HCC) 2014    Feet & Legs    Tear of meniscus of knee     4.5 yrs ago    Urinary incontinence     seen a doctor about nighttime bathroom use (adressed in 2019)       Surgical History:  Past Surgical History:   Procedure Laterality  Date    CATARACT EXTRACTION W/ INTRAOCULAR LENS IMPLANT Left 02/27/2016    EYE SURGERY  01/30/16    Cataract surgery is scheduled.    JOINT REPLACEMENT  Knee    KNEE CARTILAGE SURGERY Right 2007, 2008    x 2    KNEE SURGERY      pt reports total left knee joint replacement surgery around 2016, 2017    PLANTAR FASCIECTOMY Left     PR REVASCULARIZE FEM/POP ARTERY,ANGIOPLASTY/STENT N/A 01/29/2020    Procedure: Peripheral Angiography W Intervetion;  Surgeon: Alvira Philips, MD;  Location: [facility] CATH;  Service: Cardiology    PR REVASCULARIZE FEM/POP ARTERY,ANGIOPLASTY/STENT Right 12/26/2021    Procedure: right renal artery intervention;  Surgeon: Alvira Philips, MD;  Location: [facility] CATH;  Service: Cardiology    PR UPPER GI ENDOSCOPY,BIOPSY N/A 04/01/2021    Procedure: UGI ENDOSCOPY; WITH BIOPSY, SINGLE OR MULTIPLE;  Surgeon: Jarvis Morgan, MD;  Location: [facility]ED[facility];  Service: Gastroenterology    PR XCAPSL CTRC RMVL INSJ IO LENS PROSTH W/O ECP Right 02/13/2016    Procedure: EXTRACAPSULAR CATARACT REMOVAL W/INSERTION OF INTRAOCULAR LENS PROSTHESIS, MANUAL OR MECHANICAL TECHNIQUE;  Surgeon: Garey Ham, MD;  Location: [facility] OR [facility];  Service: Ophthalmology    PR XCAPSL CTRC RMVL INSJ IO LENS PROSTH W/O ECP Left 02/27/2016    Procedure: EXTRACAPSULAR CATARACT REMOVAL W/INSERTION OF INTRAOCULAR LENS PROSTHESIS, MANUAL OR MECHANICAL TECHNIQUE;  Surgeon: Garey Ham, MD;  Location: [facility] OR [facility];  Service: Ophthalmology    RENAL ARTERY STENT  01/11/2020    ROOT CANAL      TUBAL LIGATION  1973    [facility]    WISDOM TOOTH EXTRACTION         Social History:  Tobacco use:   reports that she quit smoking about 39 years ago. Her smoking use included cigarettes. She has been exposed to tobacco smoke. She has never used smokeless tobacco.  Alcohol use:   reports current alcohol use.  Drug use:  reports no history of drug use.      Family History:  Family History   Problem Relation Age of Onset    Heart disease Mother     Hypertension Mother     Dementia Mother     Arthritis Mother     Heart disease Father     Thyroid disease Father     Glaucoma Father     Alcohol abuse Father     Heart attack Father 50    Arthritis Father     Hypertension Father         Massive heart attack    Arthritis Sister     Glaucoma Sister     Hypertension Sister     Diabetes Sister     Diabetes Daughter     Diabetes Paternal Aunt     No Known Problems Brother     No Known Problems Maternal Aunt     No Known Problems Maternal Uncle     No Known Problems Paternal Uncle     No Known Problems Maternal Grandmother     No Known Problems Maternal Grandfather     No Known Problems Paternal Grandmother     No Known Problems Paternal Grandfather     Arthritis Sister         Noticeable in extremities    COPD Sister     Depression Sister     Hypertension Sister     Glaucoma Sister  Asthma Daughter         Ocassional    Hypertension Daughter     Miscarriages / [place] Daughter     Cancer Maternal Aunt         Died more than 51yrs ago    Early death Maternal Aunt     Diabetes Paternal Aunt         4 Aunts    Kidney disease Paternal Aunt     Amblyopia Neg Hx     Blindness Neg Hx     Cancer Neg Hx     Cataracts Neg Hx     Macular degeneration Neg Hx     Retinal detachment Neg Hx     Strabismus Neg Hx     Stroke Neg Hx            Review of Systems:    Pertinent review of systems is noted in the HPI.       Physical Exam      BP 126/70 (BP Site: L Arm, BP Position: Sitting, BP Cuff Size: Medium)  - Pulse 84  - Temp 36.4 ??C (97.6 ??F) (Oral)  - Ht 167.6 cm (5' 5.98)  - Wt 72.2 kg (159 lb 3.2 oz)  - SpO2 96%  - BMI 25.71 kg/m??       BP Readings from Last 3 Encounters:   09/24/23 126/70   09/18/23 181/77   09/17/23 180/82       Wt Readings from Last 3 Encounters:   09/24/23 72.2 kg (159 lb 3.2 oz)   09/18/23 73.9 kg (163 lb)   09/17/23 73.9 kg (163 lb)         She appears well, in no apparent distress.  Alert and oriented times three, pleasant and cooperative. Vital signs are as documented in vital signs section.  Nares are clear.  Mucous membranes are moist. Cor is regular rate and rhythm.  Lungs are clear throughout with normal respiratory effort.  No lower extremity edema.

## 2023-09-27 MED ORDER — SPIRONOLACTONE 50 MG TABLET
ORAL_TABLET | Freq: Every day | ORAL | 2 refills | 90.00 days | Status: CP
Start: 2023-09-27 — End: ?

## 2023-09-27 NOTE — Unmapped (Signed)
Refill request received for patient.      Medication Requested: Sprinolactone  Last Office Visit: 08/09/2023   Next Office Visit: Visit date not found  Last Prescriber: historical provider    Nurse refill requirements met? Yes  If not met, why: n/a    Sent to: Provider for signing  If sent to provider, which provider?: Kennis Carina

## 2023-09-29 NOTE — Unmapped (Signed)
-----   Message from Viann Shove, MD sent at 09/15/2023  2:30 PM EST -----  Regarding: xray  Xray of abdomen with moderate stool.  Ok for daily use agent to help bowels  Small dose milk of magnesia  Senna,  Smooth move tea  Prunes  Not all ,can try one to help

## 2023-09-29 NOTE — Unmapped (Signed)
Per Dr.Malkin Xray of abdomen with moderate stool.   Ok for daily use agent to help bowels   Small dose milk of magnesia   Senna,   Smooth move tea   Prunes   Not all ,can try one to help

## 2023-10-20 NOTE — Unmapped (Signed)
Colonoscopy  Procedure #1     Procedure #2   [id number]  MRN   Lolita Lenz to do  Endoscopist     Is the patient's health insurance [employer], [employer], [place] [employer] ([employer]), [employer] [place], [employer], or [place]?     Urgent procedure     Are you pregnant?     Are you in the process of scheduling or awaiting results of a heart ultrasound, stress test, or catheterization to evaluate new or worsening chest pain, dizziness, or shortness of breath?   TRUE  Do you take: Plavix (clopidogrel), Coumadin (warfarin), Lovenox (enoxaparin), Pradaxa (dabigatran), Effient (prasugrel), Xarelto (rivaroxaban), Eliquis (apixaban), Pletal (cilostazol), or Brilinta (ticagrelor)?          Did ordering provider indicate how long to hold this medication in the order comments?   Plavix       Which of the above medications are you taking?   [facility]       What is the name of the medical practice that manages this medication?   Rudene Anda, MD      What is the name of the medical provider who manages this medication?     Do you have hemophilia, von Willebrand disease, or low platelets?     Do you have a pacemaker or implanted cardiac defibrillator?     Has a [place] [facility] provider specified the location(s)?     Which location(s) did the [facility] provider specify?          [facility]          [place]          [facility]     Do you see a liver specialist for chronic liver disease?     Is the procedure indication for variceal screening?     Is procedure indication for variceal banding (this does NOT include variceal screening)?     Have you had a heart attack, stroke or heart stent placement within the past 6 months?     Month of event     Year of event (ONLY ENTER LAST 2 DIGITS)        5  Height (feet)   6  Height (inches)   160  Weight (pounds)   25.8  BMI          Did the ordering provider specify a bowel prep?          What bowel prep was specified?     Do you have an ostomy (bag on your stomach that collects your stool)? Is it an ileostomy?          Is it a colostomy?          Patient doesn't know.     Do you have chronic kidney disease?     Do you have chronic constipation or have you had poor quality bowel preps for past colonoscopies?     Do you have Crohn's disease or ulcerative colitis?     Have you had weight loss surgery?          When you walk around your house or grocery store, do you have to stop and rest due to shortness of breath, chest pain, or light-headedness?     Do you ever use supplemental oxygen?     Have you been hospitalized for cirrhosis of the liver or heart failure in the last 12 months?     Have you been treated for mouth or throat cancer with radiation or  surgery?     Have you been told that it is difficult for doctors to insert a breathing tube in you during anesthesia?     Have you had a heart or lung transplant?          Are you on dialysis?     Do you have cirrhosis of the liver?     Do you have myasthenia gravis?     Is the patient a prisoner?   ################# ## ###################################################################################################################   MRN:  [id number]   Anticoag Review  Yes. Enter Plavix and Rudene Anda, MD in 'Notes' section prior to sending to workqueue.   Nurse Triage  No   GI clinic consult  No   Procedure(s):  Colonoscopy     0   Endoscopist:  Malkin to do   Urgent:  No   Prep:  Nulytely Prep                  --------------------------- --- ----------------------------------------------------------------------------------------------------------------------------------------------------------------------------   [facility] Locations:  [facility]     [facility]     [place]        Requested Locations:              ################# ## ###################################################################################################################

## 2023-11-02 NOTE — Unmapped (Signed)
Received a message from patient stating she would like to decrease her Amlodipine 10 mg cut down to Amlodipine 5 mg daily. She says her blood pressures have been lower.  Called patient to see how blood pressures have been running since she is exercising and eating better 138/65 but never over 145/70. Messaged Dr. Laurice Record regarding this.

## 2023-11-11 ENCOUNTER — Emergency Department
Admit: 2023-11-11 | Discharge: 2023-11-12 | Disposition: A | Discharge status: Home / Self Care | Payer: MEDICARE | Attending: Emergency Medicine

## 2023-11-11 ENCOUNTER — Ambulatory Visit: Admit: 2023-11-11 | Discharge: 2023-11-12 | Payer: MEDICARE | Attending: Family | Primary: Family

## 2023-11-11 DIAGNOSIS — R079 Chest pain, unspecified: Principal | ICD-10-CM

## 2023-11-11 LAB — CBC W/ AUTO DIFF
BASOPHILS ABSOLUTE COUNT: 0.1 10*9/L (ref 0.0–0.1)
BASOPHILS RELATIVE PERCENT: 0.7 %
EOSINOPHILS ABSOLUTE COUNT: 0.2 10*9/L (ref 0.0–0.5)
EOSINOPHILS RELATIVE PERCENT: 2.3 %
HEMATOCRIT: 37.6 % (ref 34.0–44.0)
HEMOGLOBIN: 12.6 g/dL (ref 11.3–14.9)
LYMPHOCYTES ABSOLUTE COUNT: 2.7 10*9/L (ref 1.1–3.6)
LYMPHOCYTES RELATIVE PERCENT: 37 %
MEAN CORPUSCULAR HEMOGLOBIN CONC: 33.6 g/dL (ref 32.0–36.0)
MEAN CORPUSCULAR HEMOGLOBIN: 25 pg — ABNORMAL LOW (ref 25.9–32.4)
MEAN CORPUSCULAR VOLUME: 74.4 fL — ABNORMAL LOW (ref 77.6–95.7)
MEAN PLATELET VOLUME: 7 fL (ref 6.8–10.7)
MONOCYTES ABSOLUTE COUNT: 0.7 10*9/L (ref 0.3–0.8)
MONOCYTES RELATIVE PERCENT: 9.3 %
NEUTROPHILS ABSOLUTE COUNT: 3.8 10*9/L (ref 1.8–7.8)
NEUTROPHILS RELATIVE PERCENT: 50.7 %
PLATELET COUNT: 381 10*9/L (ref 150–450)
RED BLOOD CELL COUNT: 5.06 10*12/L (ref 3.95–5.13)
RED CELL DISTRIBUTION WIDTH: 16.3 % — ABNORMAL HIGH (ref 12.2–15.2)
WBC ADJUSTED: 7.4 10*9/L (ref 3.6–11.2)

## 2023-11-11 LAB — COMPREHENSIVE METABOLIC PANEL
ALBUMIN: 3.9 g/dL (ref 3.4–5.0)
ALKALINE PHOSPHATASE: 84 U/L (ref 46–116)
ALT (SGPT): <7 U/L — ABNORMAL LOW (ref 10–49)
ANION GAP: 11 mmol/L (ref 5–14)
AST (SGOT): 19 U/L (ref <=34)
BILIRUBIN TOTAL: 0.3 mg/dL (ref 0.3–1.2)
BLOOD UREA NITROGEN: 19 mg/dL (ref 9–23)
BUN / CREAT RATIO: 20
CALCIUM: 9.8 mg/dL (ref 8.7–10.4)
CHLORIDE: 101 mmol/L (ref 98–107)
CO2: 28 mmol/L (ref 20.0–31.0)
CREATININE: 0.95 mg/dL (ref 0.55–1.02)
EGFR CKD-EPI (2021) FEMALE: 60 mL/min/{1.73_m2} (ref >=60)
GLUCOSE RANDOM: 116 mg/dL (ref 70–179)
POTASSIUM: 3.9 mmol/L (ref 3.4–4.8)
PROTEIN TOTAL: 8 g/dL (ref 5.7–8.2)
SODIUM: 140 mmol/L (ref 135–145)

## 2023-11-11 LAB — D-DIMER, QUANTITATIVE: D-DIMER QUANTITATIVE (CW,ML,HL,HS,CH,JS,JC,RX,RH): 887 ng{FEU}/mL — ABNORMAL HIGH (ref <=500)

## 2023-11-11 LAB — HIGH SENSITIVITY TROPONIN I - 2 HOUR SERIAL
HIGH SENSITIVITY TROPONIN - DELTA (0-2H): 0 ng/L (ref <=7)
HIGH-SENSITIVITY TROPONIN I - 2 HOUR: 13 ng/L (ref <=34)

## 2023-11-11 LAB — HIGH SENSITIVITY TROPONIN I - SERIAL: HIGH SENSITIVITY TROPONIN I: 13 ng/L (ref <=34)

## 2023-11-11 LAB — LIPASE: LIPASE: 40 U/L (ref 12–53)

## 2023-11-11 MED ADMIN — aspirin chewable tablet 81 mg: 81 mg | ORAL | @ 23:00:00 | Stop: 2023-11-11

## 2023-11-11 NOTE — Unmapped (Signed)
Pt coming in POV for abnormal EKG. Sent her over for card work up.

## 2023-11-11 NOTE — Unmapped (Signed)
[facility]ENT [facility] [place]  [address]  [place] [address]  [phone]       [facility] URGENT CARE AT THE  F[facility] CLINIC NOTE        11/11/2023     PCP: Virgina Evener, MD       Assessment & Plan:       Kayla Knight is a 83 y.o. 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        Assessment & Plan  Chest Pain  New onset chest pain since this morning, worsened after eating. Pain is located on the left side, no radiation. Patient reports difficulty breathing and pain with deep breaths. EKG changes noted compared to previous EKG in December. No reported profuse sweating or new leg swelling.  -Administered chewable aspirin.  -Urgent referral to the emergency department for further evaluation and management.    Obstructive Sleep Apnea  Patient reports difficulty tolerating CPAP machine.  -No changes to current management plan at this time.    Neck Pain  Patient reports neck pain, recently had an MRI. Noted to have a fall at the senior center last week.  -No changes to current management plan at this time.      Results      DIAGNOSTIC  EKG: Rhythm:   Sinus rhythm with PVCs   Pulmonary disease pattern   Left anterior fascicular block   Voltage criteria for LVH         Transferred to ED Patient Disposition    Indication for Disposition: Requires a higher level of care    Chief Complaint: Chest pain      Kayla Knight given instructions to proceed to ED via personal vehicle; Location: [facility] due to Needs imaging not available in UC        *Patient note was created using Abridge  Any errors in syntax or even information may not have been identified and edited on initial review prior to signing this note.      Please let me know if there are any questions or concerns.     Sincerely,  Rondell Frick N Kin Galbraith     -----------------------------------------------------------    Subjective:     Chief Complaint   Patient presents with    Chest Pain     Left sided chest pain; seems related to breathing     Shortness of Breath       History of Present Illness  Kayla Knight is an 83 year old female who presents with chest pain.    She experienced the onset of chest pain this morning around 5:30 AM upon waking. The pain was constant until her 9:00 AM MRI appointment, after which it seemed to subside. However, the pain returned and worsened after she ate upon returning home. The chest pain is located on the left side and does not radiate to the neck, back, or shoulder. She describes difficulty breathing and an inability to perform tasks due to the pain. No profuse sweating or new swelling in her legs. She has not taken any medication for the pain.    She mentions experiencing wheezing upon waking on two consecutive mornings prior to today. She attributes this to trying to get used to  her CPAP machine, which she finds uncomfortable and has not been using.    She also reports neck pain, which she associates with a fall at the senior center while playing ping pong last week. This neck pain was the reason for her MRI. She describes a tingling sensation in the neck area.      Please see HPI for ROS  -----------------------------------------------------------  PAST MEDICAL HISTORY:   Past Medical History:   Diagnosis Date    Anemia     past history, over 40 yrs ago    Cataract 2008    Surgery    Diabetes mellitus (CMS-HCC)     Type II, on 9/15 patient stated she was not longer diabetic, not taking meds    Dry eye     Hearing impairment     Heart murmur 2016    [facility] Verdie [facility]    Heart valve disease     Heart mumur, pt reports diagnosis 10 yrs ago (05/10/20)    History of stent insertion of renal artery 01/29/2020    ASA indefinitely and plavix x 6 months per procedure note    Hypertension     pt reports taking meds since about 1995    Neuromuscular disorder (CMS-HCC) 2014    Feet & Legs    Tear of meniscus of knee     4.5 yrs ago    Urinary incontinence     seen a doctor about nighttime bathroom use (adressed in 2019)       PAST SURGICAL HISTORY:  Past Surgical History:   Procedure Laterality Date    CATARACT EXTRACTION W/ INTRAOCULAR LENS IMPLANT Left 02/27/2016    EYE SURGERY  01/30/16    Cataract surgery is scheduled.    JOINT REPLACEMENT  Knee    KNEE CARTILAGE SURGERY Right 2007, 2008    x 2    KNEE SURGERY      pt reports total left knee joint replacement surgery around 2016, 2017    PLANTAR FASCIECTOMY Left     PR REVASCULARIZE FEM/POP ARTERY,ANGIOPLASTY/STENT N/A 01/29/2020    Procedure: Peripheral Angiography W Intervetion;  Surgeon: Alvira Philips, MD;  Location: [facility] CATH;  Service: Cardiology    PR REVASCULARIZE FEM/POP ARTERY,ANGIOPLASTY/STENT Right 12/26/2021    Procedure: right renal artery intervention;  Surgeon: Alvira Philips, MD;  Location: [facility] CATH;  Service: Cardiology    PR UPPER GI ENDOSCOPY,BIOPSY N/A 04/01/2021    Procedure: UGI ENDOSCOPY; WITH BIOPSY, SINGLE OR MULTIPLE;  Surgeon: Jarvis Morgan, MD;  Location: [facility];  Service: Gastroenterology    PR XCAPSL CTRC RMVL INSJ IO LENS PROSTH W/O ECP Right 02/13/2016    Procedure: EXTRACAPSULAR CATARACT REMOVAL W/INSERTION OF INTRAOCULAR LENS PROSTHESIS, MANUAL OR MECHANICAL TECHNIQUE;  Surgeon: Garey Ham, MD;  [facility] OR [facility];  Service: Ophthalmology    PR XCAPSL CTRC RMVL INSJ IO LENS PROSTH W/O ECP Left 02/27/2016    Procedure: EXTRACAPSULAR CATARACT REMOVAL W/INSERTION OF INTRAOCULAR LENS PROSTHESIS, MANUAL OR MECHANICAL TECHNIQUE;  Surgeon: Garey Ham, MD;  Location: [facility];  Service: Ophthalmology    RENAL ARTERY STENT  01/11/2020    ROOT CANAL TUBAL LIGATION  1973    [facility]    WISDOM TOOTH EXTRACTION         FAMILY HISTORY:  Family History   Problem Relation Age of Onset    Heart disease Mother     Hypertension Mother  Dementia Mother     Arthritis Mother     Heart disease Father     Thyroid disease Father     Glaucoma Father     Alcohol abuse Father     Heart attack Father 85    Arthritis Father     Hypertension Father         Massive heart attack    Arthritis Sister     Glaucoma Sister     Hypertension Sister     Diabetes Sister     Diabetes Daughter     Diabetes Paternal Aunt     No Known Problems Brother     No Known Problems Maternal Aunt     No Known Problems Maternal Uncle     No Known Problems Paternal Uncle     No Known Problems Maternal Grandmother     No Known Problems Maternal Grandfather     No Known Problems Paternal Grandmother     No Known Problems Paternal Grandfather     Arthritis Sister         Noticeable in extremities    COPD Sister     Depression Sister     Hypertension Sister     Glaucoma Sister     Asthma Daughter         Ocassional    Hypertension Daughter     Miscarriages / [place] Daughter     Cancer Maternal Aunt         Died more than 8yrs ago    Early death Maternal Aunt     Diabetes Paternal Aunt         4 Aunts    Kidney disease Paternal Aunt     Amblyopia Neg Hx     Blindness Neg Hx     Cancer Neg Hx     Cataracts Neg Hx     Macular degeneration Neg Hx     Retinal detachment Neg Hx     Strabismus Neg Hx     Stroke Neg Hx        ___________________________________  CURRENT MEDS:  Current Outpatient Medications   Medication Sig Dispense Refill    amlodipine (NORVASC) 10 MG tablet Take 1 tablet (10 mg total) by mouth daily. 90 tablet 3    clopidogrel (PLAVIX) 75 mg tablet TAKE 1 TABLET (75 MG TOTAL) BY MOUTH DAILY. 90 tablet 2    empty container (SHARPS-A-GATOR DISPOSAL SYSTEM) Misc Use as directed for sharps disposal 1 each 2    evolocumab (REPATHA SURECLICK) 140 mg/mL PnIj Inject the contents of 1 pen (140 mg) under the skin every fourteen (14) days. 6 mL 3    mag hydrox/aluminum hyd/simeth (ANTACID ANTI-GAS ORAL) Take by mouth.      spironolactone (ALDACTONE) 50 MG tablet TAKE 1 TABLET (50 MG TOTAL) BY MOUTH DAILY. 90 tablet 2    timolol (TIMOPTIC) 0.25 % ophthalmic solution Administer 1 drop to both eyes daily. 5 mL 5    famotidine (PEPCID) 20 MG tablet Take 1 tablet (20 mg total) by mouth two (2) times a day. 180 tablet 3     No current facility-administered medications for this visit.       ___________________________________  ALLERGIES:  Allergies   Allergen Reactions    Penicillins Itching and Swelling    Lisinopril Cough    Statins-Hmg-Coa Reductase Inhibitors      Ineffective at controlling cholesterol when tried in the past.    Sulfa (Sulfonamide Antibiotics) Itching     -----------------------------------------------------------  Objective:         Vitals:    11/11/23 1721   BP: 129/70   Pulse: 72   Temp: 36.3 ??C (97.4 ??F)   TempSrc: Temporal   SpO2: 97%   Weight: 70.7 kg (155 lb 12.8 oz)             Physical Exam  Vitals and nursing note reviewed.   Constitutional:       General: She is not in acute distress.     Appearance: She is not ill-appearing or diaphoretic.   Cardiovascular:      Rate and Rhythm: Rhythm irregular.   Pulmonary:      Effort: Pulmonary effort is normal.      Breath sounds: Normal breath sounds.   Skin:     General: Skin is warm and dry.   Neurological:      Mental Status: She is alert and oriented to person, place, and time.                  [facility]  [place] [facility] [place] [facility]  [address], [place], [place] [address]  Telephone [phone]  Fax [phone]  [email or web address]

## 2023-11-11 NOTE — Unmapped (Signed)
 ED Progress Note    I received signout from previous ED physician.     ED Course as of 11/12/23 0329   Thu Nov 11, 2023   2323 Received signout from daytime resident. This is a 83yo female that presented wi chest pain that began in the morning. Chest pain described as pressure-like and pleuritic. Hemodynamcally stable,  EKG with possible left anterior fascicular block, no other abnormalities, Troponins x2 negative, Wells score 3, D-dimer positive, pending CTA chest to rule out pulmonary embolism.    Fri Nov 12, 2023   4782 CTA negative for PE. Discharged home with return precautions.         Final diagnoses:   Pleuritic chest pain (Primary)       Vitals:    11/11/23 2215 11/11/23 2216 11/11/23 2259 11/11/23 2358   BP: 154/85  169/82 155/73   Pulse: 88 83 78 75   Resp: 16 23 23 20    Temp:       TempSrc:       SpO2:  94% 94% 95%       CTA Chest W Contrast   Final Result      1. No evidence of pulmonary embolism.   2. Mosaic attenuation of the lung parenchyma, favored to reflect air trapping and/or atelectasis. No focal pneumonia.   3. Chronic and incidental findings as noted above.      XR Chest 2 views   Final Result      No acute abnormality.          Lab Results   Component Value Date    WBC 7.4 11/11/2023    HGB 12.6 11/11/2023    HCT 37.6 11/11/2023    PLT 381 11/11/2023       Lab Results   Component Value Date    NA 140 11/11/2023    K 3.9 11/11/2023    CL 101 11/11/2023    CO2 28.0 11/11/2023    BUN 19 11/11/2023    CREATININE 0.95 11/11/2023    GLU 116 11/11/2023    CALCIUM 9.8 11/11/2023    MG 2.0 11/13/2019       Lab Results   Component Value Date    BILITOT 0.3 11/11/2023    PROT 8.0 11/11/2023    ALBUMIN 3.9 11/11/2023    ALT <7 (L) 11/11/2023    AST 19 11/11/2023    ALKPHOS 84 11/11/2023       Lab Results   Component Value Date    INR 0.91 08/13/2018    APTT 34.1 08/13/2018

## 2023-11-11 NOTE — Unmapped (Signed)
It was a pleasure meeting you today!  Below you will find the plan for your care and any reading material related to your symptoms:       There are no diagnoses linked to this encounter.    If tests or imaging was performed today, I will contact you via MyChart with positive or concerning results.      If symptoms do not improve, please be sure to follow up with your primary care provider regarding today's visit.  If your symptoms become severe, please report to Emergency Room or call 911.

## 2023-11-11 NOTE — Unmapped (Signed)
 [facility]  Emergency Department Provider Note      ED Clinical Impression       Diagnosis ICD-10-CM Associated Orders   1. Pleuritic chest pain  R07.81                Impression, Medical Decision Making, Progress Notes and Critical Care      Impression, Differential Diagnosis and Plan of Care      ED Course as of 11/11/23 2249   Thu Nov 11, 2023   2101 Patient presenting with midline and left-sided chest pain beginning this morning after eating lunch. Chest pain pressure-like in character, pleuritic. Denies cough, fevers. Satting well on RA, afebrile. Crackles in the left base on exam. Given sudden onset, pleuritic character to chest pain, will obtain D-dimer to help assess for PE. Wells score 3. EKG with possible left anterior fascicular block, no other abnormalities. Lower concern for cardiac cause of chest pain given stable EKG and negative troponins.   2103 XR Chest 2 views  No acute abnormality, scarring in the left base.   2104 CBC, CMP unremarkable.   2145 D-Dimer(!): 887  D-dimer elevated, will proceed with CTA chest to evaluate for PE.       Additional MDM Elements                          Portions of this record have been created using Dragon dictation software. Dictation errors have been sought, but may not have been identified and corrected.    See chart and nursing documentation for additional ED course details.    ____________________________________________         History        Reason for Visit  Irregular Heart Beat      HPI   Kayla Knight is a 83 y.o. female with PMHx anemia, DM type II, MVR, renal artery stent presenting from urgent care for evaluation of abnormal EKG.    Patient presenting for evaluation of chest pain, which began in the morning of 2/13. States that she first noticed a pressure-like pain of her midline and left chest that began after eating lunch this morning. Since then, the pain has been constant and progressively worsening. Went to an urgent care, where she received an EKG and was told to come to the emergency department for further evaluation. States that the chest pain is worse with inspiration. Has never had any chest pain in the past. Endorses shortness of breath, which she attributes to not being able to take deep inspirations. Was otherwise in her normal state of health. Denies abdominal pain, cough, congestion, urinary symptoms.    Has a history of mitral valve regurgitation. No other cardiac history. Denies history of DVT/PE. Says she usually walks 5 miles a day, is now unable to do that with her chest pain.    Outside Historian(s)  (EMS, Significant Other, Family, Parent, Caregiver, Friend, Law Enforcement, etc.)    None    Past Medical History:   Diagnosis Date    Anemia     past history, over 40 yrs ago    Cataract 2008    Surgery    Diabetes mellitus (CMS-HCC)     Type II, on 9/15 patient stated she was not longer diabetic, not taking meds    Dry eye     Hearing impairment     Heart murmur 2016    Dr. Verdie Shire @ [facility]  Heart valve disease     Heart mumur, pt reports diagnosis 10 yrs ago (05/10/20)    History of stent insertion of renal artery 01/29/2020    ASA indefinitely and plavix x 6 months per procedure note    Hypertension     pt reports taking meds since about 1995    Neuromuscular disorder (CMS-HCC) 2014    Feet & Legs    Tear of meniscus of knee     4.5 yrs ago    Urinary incontinence     seen a doctor about nighttime bathroom use (adressed in 2019)       Past Surgical History:   Procedure Laterality Date    CATARACT EXTRACTION W/ INTRAOCULAR LENS IMPLANT Left 02/27/2016    EYE SURGERY  01/30/16    Cataract surgery is scheduled.    JOINT REPLACEMENT  Knee    KNEE CARTILAGE SURGERY Right 2007, 2008    x 2    KNEE SURGERY      pt reports total left knee joint replacement surgery around 2016, 2017    PLANTAR FASCIECTOMY Left     PR REVASCULARIZE FEM/POP ARTERY,ANGIOPLASTY/STENT N/A 01/29/2020    Procedure: Peripheral Angiography W Intervetion;  Surgeon: Alvira Philips, MD;  Location: [facility] CATH;  Service: Cardiology    PR REVASCULARIZE FEM/POP ARTERY,ANGIOPLASTY/STENT Right 12/26/2021    Procedure: right renal artery intervention;  Surgeon: Alvira Philips, MD;  Location: [facility] CATH;  Service: Cardiology    PR UPPER GI ENDOSCOPY,BIOPSY N/A 04/01/2021    Procedure: UGI ENDOSCOPY; WITH BIOPSY, SINGLE OR MULTIPLE;  Surgeon: Jarvis Morgan, MD;  [facility]: [facility];  Service: Gastroenterology    PR XCAPSL CTRC RMVL INSJ IO LENS PROSTH W/O ECP Right 02/13/2016    Procedure: EXTRACAPSULAR CATARACT REMOVAL W/INSERTION OF INTRAOCULAR LENS PROSTHESIS, MANUAL OR MECHANICAL TECHNIQUE;  Surgeon: Garey Ham, MD;  Location: [facility];  Service: Ophthalmology    PR XCAPSL CTRC RMVL INSJ IO LENS PROSTH W/O ECP Left 02/27/2016    Procedure: EXTRACAPSULAR CATARACT REMOVAL W/INSERTION OF INTRAOCULAR LENS PROSTHESIS, MANUAL OR MECHANICAL TECHNIQUE;  Surgeon: Garey Ham, MD;  [facility];  Service: Ophthalmology    RENAL ARTERY STENT  01/11/2020    ROOT CANAL      TUBAL LIGATION  1973    [facility]    WISDOM TOOTH EXTRACTION         No current facility-administered medications for this encounter.    Current Outpatient Medications:     amlodipine (NORVASC) 10 MG tablet, Take 1 tablet (10 mg total) by mouth daily., Disp: 90 tablet, Rfl: 3    clopidogrel (PLAVIX) 75 mg tablet, TAKE 1 TABLET (75 MG TOTAL) BY MOUTH DAILY., Disp: 90 tablet, Rfl: 2    empty container (SHARPS-A-GATOR DISPOSAL SYSTEM) Misc, Use as directed for sharps disposal, Disp: 1 each, Rfl: 2    evolocumab (REPATHA SURECLICK) 140 mg/mL PnIj, Inject the contents of 1 pen (140 mg) under the skin every fourteen (14) days., Disp: 6 mL, Rfl: 3    famotidine (PEPCID) 20 MG tablet, Take 1 tablet (20 mg total) by mouth two (2) times a day., Disp: 180 tablet, Rfl: 3    mag hydrox/aluminum hyd/simeth (ANTACID ANTI-GAS ORAL), Take by mouth., Disp: , Rfl:     spironolactone (ALDACTONE) 50 MG tablet, TAKE 1 TABLET (50 MG TOTAL) BY MOUTH DAILY., Disp: 90 tablet, Rfl: 2    timolol (TIMOPTIC) 0.25 % ophthalmic solution, Administer 1 drop  to both eyes daily., Disp: 5 mL, Rfl: 5    Allergies  Penicillins, Lisinopril, Statins-hmg-coa reductase inhibitors, and Sulfa (sulfonamide antibiotics)    Family History   Problem Relation Age of Onset    Heart disease Mother     Hypertension Mother     Dementia Mother     Arthritis Mother     Heart disease Father     Thyroid disease Father     Glaucoma Father     Alcohol abuse Father     Heart attack Father 46    Arthritis Father     Hypertension Father         Massive heart attack    Arthritis Sister     Glaucoma Sister     Hypertension Sister     Diabetes Sister     Diabetes Daughter     Diabetes Paternal Aunt     No Known Problems Brother     No Known Problems Maternal Aunt     No Known Problems Maternal Uncle     No Known Problems Paternal Uncle     No Known Problems Maternal Grandmother     No Known Problems Maternal Grandfather     No Known Problems Paternal Grandmother     No Known Problems Paternal Grandfather     Arthritis Sister         Noticeable in extremities    COPD Sister     Depression Sister     Hypertension Sister     Glaucoma Sister     Asthma Daughter         Ocassional    Hypertension Daughter     Miscarriages / Stillbirths Daughter     Cancer Maternal Aunt         Died more than 16yrs ago    Early death Maternal Aunt     Diabetes Paternal Aunt         4 Aunts    Kidney disease Paternal Aunt     Amblyopia Neg Hx     Blindness Neg Hx     Cancer Neg Hx     Cataracts Neg Hx     Macular degeneration Neg Hx     Retinal detachment Neg Hx     Strabismus Neg Hx     Stroke Neg Hx        Social History     Tobacco Use    Smoking status: Former     Current packs/day: 0.00     Types: Cigarettes     Quit date: 05/19/1984     Years since quitting: 39.5     Passive exposure: Past    Smokeless tobacco: Never Vaping Use    Vaping status: Never Used   Substance Use Topics    Alcohol use: Yes     Comment: occassional    Drug use: Never          Physical Exam     ED Triage Vitals   Enc Vitals Group      BP 11/11/23 1830 150/74      Heart Rate 11/11/23 1807 75      SpO2 Pulse --       Resp 11/11/23 1828 20      Temp 11/11/23 1828 36.8 ??C (98.2 ??F)      Temp Source 11/11/23 1828 Oral      SpO2 11/11/23 1807 99 %      Weight --       Height --  Head Circumference --       Peak Flow --       Pain Score --       Pain Loc --       Pain Education --       Exclude from Growth Chart --        Constitutional: Alert and oriented. Well appearing and in no distress.  Eyes: Conjunctivae are normal.  ENT       Head: Normocephalic and atraumatic.       Nose: No congestion.       Mouth/Throat: Mucous membranes are moist.       Neck: No stridor.  Hematological/Lymphatic/Immunilogical: No cervical lymphadenopathy.  Cardiovascular: Normal rate, regular rhythm. Holosystolic murmur present.  Respiratory: Normal respiratory effort. Breath sounds decreased in the left base.  Gastrointestinal: Soft and nontender. There is no CVA tenderness.  Genitourinary: deferred  Musculoskeletal: Normal range of motion in all extremities.       Right lower leg: No tenderness or edema.       Left lower leg: No tenderness or edema.  Neurologic: Normal speech and language. No gross focal neurologic deficits are appreciated.  Skin: Skin is warm, dry and intact. No rash noted.  Psychiatric: Mood and affect are normal. Speech and behavior are normal.       Radiology     XR Chest 2 views   Final Result      No acute abnormality.      CTA Chest W Contrast    (Results Pending)        Procedures including Critical Care            Jory Ee, MD  Resident  11/11/23 [phone]

## 2023-11-12 LAB — HIGH SENSITIVITY TROPONIN I - 6 HOUR SERIAL
HIGH SENSITIVITY TROPONIN - DELTA (2-6H): 1 ng/L (ref <=7)
HIGH-SENSITIVITY TROPONIN I - 6 HOUR: 14 ng/L (ref <=34)

## 2023-11-12 MED ADMIN — iohexol (OMNIPAQUE) 350 mg iodine/mL solution 75 mL: 75 mL | INTRAVENOUS | @ 06:00:00 | Stop: 2023-11-12

## 2023-11-12 MED ADMIN — acetaminophen (TYLENOL) tablet 650 mg: 650 mg | ORAL | @ 02:00:00 | Stop: 2023-11-11

## 2023-11-12 MED ADMIN — ketorolac (TORADOL) injection 15 mg: 15 mg | INTRAVENOUS | @ 05:00:00 | Stop: 2023-11-11

## 2023-12-01 NOTE — Unmapped (Signed)
 Kayla Knight requested a refill of their Repatha via IVR/Web. [facility] has scheduled delivery per the patients request via Same Day Courier to be delivered to their prescription address on 12/03/23.

## 2023-12-03 MED FILL — REPATHA SURECLICK 140 MG/ML SUBCUTANEOUS PEN INJECTOR: SUBCUTANEOUS | 84 days supply | Qty: 6 | Fill #3

## 2023-12-09 ENCOUNTER — Ambulatory Visit: Admit: 2023-12-09 | Discharge: 2023-12-10 | Payer: MEDICARE | Attending: Family | Primary: Family

## 2023-12-09 DIAGNOSIS — R109 Unspecified abdominal pain: Principal | ICD-10-CM

## 2023-12-09 MED ORDER — FOSFOMYCIN TROMETHAMINE 3 GRAM ORAL PACKET
Freq: Once | ORAL | 0 refills | 1.00 days | Status: CP
Start: 2023-12-09 — End: 2023-12-09

## 2023-12-09 NOTE — Unmapped (Signed)
 Addended by: Aida Puffer on: 12/09/2023 03:17 PM     Modules accepted: Orders

## 2023-12-09 NOTE — Unmapped (Signed)
 [name] CLINIC NOTE      12/09/2023    PCP: Virgina Evener, MD       ASSESSMENT/PLAN:  Any labs and radiology results that were available during my care of the patient were independently reviewed by me and considered in my medical decision making.    Impression:   Kayla Knight is a 83 y.o. female with PMH significant for T2DM, HTN, OA, mitral regurgitation, renal artery stenosis bilateral, aortic valve insufficiency, glaucoma and bilateral posterior vitreous detachment presenting to clinic today regarding the following issues: left flank pain     I did review recent labs, imaging and notes from her UC and ER visits 1 month ago. Given this is a new pain I do not feel it is related to the fall.  The pain is located in her left CVA region but there is not CVA tenderness on exam.     Urine dip today shows 1+ leukocytes and trace ketones    Plan:  Try fosfomycin  Will send urine for culture  Return precautions reviewed  Continue supportive care at home    Problem List Items Addressed This Visit    None  Visit Diagnoses         Left flank pain    -  Primary            RTC: if symptoms worsen or fail to improve      Follow-up as Needed     Items for PCP to follow-up on next visit: left flank pain with + leukocytes in urine           -----------------------------------------------------------  SUBJECTIVE:  Chief Complaint   Patient presents with    Fall     1x month ago;     Rib Pain     Also having diverticulitis unsure if related;        HPI:  Kayla Knight is a 83 y.o. female that presents to clinic today regarding the following issues:    History of Present Illness  The patient presents with left flank and low back pain following a fall over a month ago.    She has been experiencing persistent pain in the left flank and low back area recently. She had a fall over a month ago and is wondering if the pain could be related. The pain is intermittent, sore to touch, and becomes 'real sore' when pressed. She is uncertain if this pain is related to the fall, diverticulitis, or possibly kidney issues. No urinary symptoms are present, and she has not noticed any swelling, bruising, or redness, although she cannot see her back and has no one at home to check for her. The pain impacts her daily activities, making it difficult to get out of the car and requiring her to use a raised toilet seat. She does not take any medication for the pain but uses an oil made of frankincense and myrrh, which provides some relief.    She has a history of diverticulitis, which was diagnosed after the fall. She experienced excruciating pain in the lower abdomen, leading to a hospital visit where she received an enema that resolved the symptoms. She has been managing her diverticulitis well since then, although she occasionally eats foods not recommended for her condition, such as cookies and beef.    She reports very slight bowel movements despite taking Miralax, which previously resulted in more significant bowel movements. Her last bowel movement  was yesterday, and she notes no blood in her stool. The constipation may be related to dietary habits or underlying diverticulitis.    Following the fall, she experienced chest pain severe enough to affect her breathing, which led her to the ER where it was determined to be muscular in nature. The chest pain has since resolved.        ROS: Please see HPI.     -----------------------------------------------------------  PAST MEDICAL HISTORY:   Past Medical History:   Diagnosis Date    Anemia     past history, over 40 yrs ago    Cataract 2008    Surgery    Diabetes mellitus (CMS-HCC)     Type II, on 9/15 patient stated she was not longer diabetic, not taking meds    Dry eye     Hearing impairment     Heart murmur 2016    [facility] Verdie Shire @ [facility]    Heart valve disease     Heart mumur, pt reports diagnosis 10 yrs ago (05/10/20)    History of stent insertion of renal artery 01/29/2020    ASA indefinitely and plavix x 6 months per procedure note    Hypertension     pt reports taking meds since about 1995    Neuromuscular disorder (CMS-HCC) 2014    Feet & Legs    Tear of meniscus of knee     4.5 yrs ago    Urinary incontinence     seen a doctor about nighttime bathroom use (adressed in 2019)       PAST SURGICAL HISTORY:  Past Surgical History:   Procedure Laterality Date    CATARACT EXTRACTION W/ INTRAOCULAR LENS IMPLANT Left 02/27/2016    EYE SURGERY  01/30/16    Cataract surgery is scheduled.    JOINT REPLACEMENT  Knee    KNEE CARTILAGE SURGERY Right 2007, 2008    x 2    KNEE SURGERY      pt reports total left knee joint replacement surgery around 2016, 2017    PLANTAR FASCIECTOMY Left     PR REVASCULARIZE FEM/POP ARTERY,ANGIOPLASTY/STENT N/A 01/29/2020    Procedure: Peripheral Angiography W Intervetion;  Surgeon: Alvira Philips, MD;  Location: [facility] CATH;  Service: Cardiology    PR REVASCULARIZE FEM/POP ARTERY,ANGIOPLASTY/STENT Right 12/26/2021    Procedure: right renal artery intervention;  Surgeon: Alvira Philips, MD;  Location: [facility] CATH;  Service: Cardiology    PR UPPER GI ENDOSCOPY,BIOPSY N/A 04/01/2021    Procedure: UGI ENDOSCOPY; WITH BIOPSY, SINGLE OR MULTIPLE;  Surgeon: Jarvis Morgan, MD;  Location: [facility];  Service: Gastroenterology    PR XCAPSL CTRC RMVL INSJ IO LENS PROSTH W/O ECP Right 02/13/2016    Procedure: EXTRACAPSULAR CATARACT REMOVAL W/INSERTION OF INTRAOCULAR LENS PROSTHESIS, MANUAL OR MECHANICAL TECHNIQUE;  Surgeon: Garey Ham, MD;  [facility];  Service: Ophthalmology    PR XCAPSL CTRC RMVL INSJ IO LENS PROSTH W/O ECP Left 02/27/2016    Procedure: EXTRACAPSULAR CATARACT REMOVAL W/INSERTION OF INTRAOCULAR LENS PROSTHESIS, MANUAL OR MECHANICAL TECHNIQUE;  Surgeon: Garey Ham, MD;  [facility];  Service: Ophthalmology    RENAL ARTERY STENT  01/11/2020 ROOT CANAL      TUBAL LIGATION  1973    [facility]    WISDOM TOOTH EXTRACTION         SOCIAL HISTORY:  Social History     Socioeconomic History    Marital status: Single  Tobacco Use    Smoking status: Former     Current packs/day: 0.00     Types: Cigarettes     Quit date: 05/19/1984     Years since quitting: 39.5     Passive exposure: Past    Smokeless tobacco: Never   Vaping Use    Vaping status: Never Used   Substance and Sexual Activity    Alcohol use: Yes     Comment: occassional    Drug use: Never    Sexual activity: Not Currently     Partners: Male   Social History Narrative    Exercise: water aerobics 3-4 x week for an hour        Eye: Dr.Merrit @ [facility] 2018        Dentist: [facility] 2018     Social Drivers of Health     Financial Resource Strain: Low Risk  (01/04/2023)    Overall Financial Resource Strain (CARDIA)     Difficulty of Paying Living Expenses: Not hard at all   Food Insecurity: No Food Insecurity (01/04/2023)    Hunger Vital Sign     Worried About Running Out of Food in the Last Year: Never true     Ran Out of Food in the Last Year: Never true   Transportation Needs: No Transportation Needs (01/04/2023)    PRAPARE - Therapist, art (Medical): No     Lack of Transportation (Non-Medical): No   Physical Activity: Sufficiently Active (01/04/2023)    Exercise Vital Sign     Days of Exercise per Week: 7 days     Minutes of Exercise per Session: 30 min   Stress: No Stress Concern Present (01/04/2023)    [employer] of Occupational Health - Occupational Stress Questionnaire     Feeling of Stress : Only a little   Social Connections: Moderately Isolated (01/04/2023)    Social Connection and Isolation Panel [NHANES]     Frequency of Communication with Friends and Family: More than three times a week     Frequency of Social Gatherings with Friends and Family: More than three times a week     Attends Religious Services: Never     Database administrator or Organizations: Yes Attends Engineer, structural: More than 4 times per year     Marital Status: Divorced   Housing: Unknown (11/02/2023)    Received from [employer] Stability Vital Sign     Homeless in the Last Year: No       FAMILY HISTORY:  Family History   Problem Relation Age of Onset    Heart disease Mother     Hypertension Mother     Dementia Mother     Arthritis Mother     Heart disease Father     Thyroid disease Father     Glaucoma Father     Alcohol abuse Father     Heart attack Father 50    Arthritis Father     Hypertension Father         Massive heart attack    Arthritis Sister     Glaucoma Sister     Hypertension Sister     Diabetes Sister     Diabetes Daughter     Diabetes Paternal Aunt     No Known Problems Brother     No Known Problems Maternal Aunt     No Known Problems Maternal Uncle  No Known Problems Paternal Uncle     No Known Problems Maternal Grandmother     No Known Problems Maternal Grandfather     No Known Problems Paternal Grandmother     No Known Problems Paternal Grandfather     Arthritis Sister         Noticeable in extremities    COPD Sister     Depression Sister     Hypertension Sister     Glaucoma Sister     Asthma Daughter         Ocassional    Hypertension Daughter     Miscarriages / [place] Daughter     Cancer Maternal Aunt         Died more than 62yrs ago    Early death Maternal Aunt     Diabetes Paternal Aunt         4 Aunts    Kidney disease Paternal Aunt     Amblyopia Neg Hx     Blindness Neg Hx     Cancer Neg Hx     Cataracts Neg Hx     Macular degeneration Neg Hx     Retinal detachment Neg Hx     Strabismus Neg Hx     Stroke Neg Hx        ___________________________________  CURRENT MEDS:  Current Outpatient Medications   Medication Sig Dispense Refill    amlodipine (NORVASC) 10 MG tablet Take 1 tablet (10 mg total) by mouth daily. 90 tablet 3    clopidogrel (PLAVIX) 75 mg tablet TAKE 1 TABLET (75 MG TOTAL) BY MOUTH DAILY. 90 tablet 2    empty container (SHARPS-A-GATOR DISPOSAL SYSTEM) Misc Use as directed for sharps disposal 1 each 2    evolocumab (REPATHA SURECLICK) 140 mg/mL PnIj Inject the contents of 1 pen (140 mg) under the skin every fourteen (14) days. 6 mL 3    famotidine (PEPCID) 20 MG tablet Take 1 tablet (20 mg total) by mouth two (2) times a day. 180 tablet 3    mag hydrox/aluminum hyd/simeth (ANTACID ANTI-GAS ORAL) Take by mouth.      spironolactone (ALDACTONE) 50 MG tablet TAKE 1 TABLET (50 MG TOTAL) BY MOUTH DAILY. 90 tablet 2    timolol (TIMOPTIC) 0.25 % ophthalmic solution Administer 1 drop to both eyes daily. 5 mL 5     No current facility-administered medications for this visit.       ___________________________________  ALLERGIES:  Allergies   Allergen Reactions    Penicillins Itching and Swelling    Lisinopril Cough    Statins-Hmg-Coa Reductase Inhibitors      Ineffective at controlling cholesterol when tried in the past.    Sulfa (Sulfonamide Antibiotics) Itching     -----------------------------------------------------------  OBJECTIVE:  Physical Exam:  VITALS:   Vitals:    12/09/23 1005   BP: 122/60   Pulse: 66   Temp: 36.6 ??C (97.9 ??F)    Wt:   Wt Readings from Last 3 Encounters:   12/09/23 68 kg (150 lb)   11/11/23 70.7 kg (155 lb 12.8 oz)   09/24/23 72.2 kg (159 lb 3.2 oz)       PHQ-2 Score:           Physical Exam  Vitals and nursing note reviewed.   Constitutional:       General: She is not in acute distress.     Appearance: Normal appearance. She is not toxic-appearing.   HENT:      Mouth/Throat:  Mouth: Mucous membranes are moist.   Eyes:      Conjunctiva/sclera: Conjunctivae normal.   Cardiovascular:      Rate and Rhythm: Normal rate and regular rhythm.      Heart sounds: Murmur heard.   Pulmonary:      Effort: Pulmonary effort is normal.      Breath sounds: Normal breath sounds.   Abdominal:      General: Abdomen is flat. Bowel sounds are normal.      Palpations: Abdomen is soft.      Tenderness: There is no right CVA tenderness or left CVA tenderness.   Skin:     General: Skin is warm and dry.      Findings: No bruising, erythema or rash.   Neurological:      General: No focal deficit present.      Mental Status: She is alert and oriented to person, place, and time.   Psychiatric:         Mood and Affect: Mood normal.         Behavior: Behavior normal.         Thought Content: Thought content normal.         Judgment: Judgment normal.               LABS:  Office Visit on 12/09/2023   Component Date Value Ref Range Status    Spec Gravity/POC 12/09/2023 1.020  1.003 - 1.030 Final    PH/POC 12/09/2023 6.0  5.0 - 9.0 Final    Leuk Esterase/POC 12/09/2023 1+ (A)  Negative Final    Nitrite/POC 12/09/2023 Negative  Negative Final    Protein/POC 12/09/2023 Negative  Negative Final    UA Glucose/POC 12/09/2023 Negative  Negative Final    Ketones, POC 12/09/2023 Trace (A)  Negative Final    Bilirubin/POC 12/09/2023 Negative  Negative Final    Blood/POC 12/09/2023 Negative  Negative Final    Urobilinogen/POC 12/09/2023 0.2  0.2 - 1.0 mg/dL Final       STUDIES:  OCT Glaucoma - Cirrus - OU - Both Eyes  Result Date: 12/01/2023  Right Eye Reliability was good. RNFL thickness was abnormal. RNFL thinning was inferior. No previous testing to compare RNFL progression. Left Eye Reliability was good. RNFL thickness was normal. No previous testing to compare RNFL progression.     Humphrey Visual Field Extended - OU - Both Eyes  Result Date: 12/01/2023  Right Eye Threshold was 24-2. Strategy was SITA-Fast. Reliability was good. General findings include normal observations. No previous to compare. Left Eye Threshold was 24-2. Strategy was SITA-Fast. Reliability was good. General findings include normal observations. Superior defects include non-specific. No previous to compare.     ECG 12 Lead  Result Date: 11/12/2023  SINUS RHYTHM WITH PREMATURE SUPRAVENTRICULAR BEATS LEFT ANTERIOR FASCICULAR BLOCK VOLTAGE CRITERIA FOR LEFT VENTRICULAR HYPERTROPHY ABNORMAL ECG WHEN COMPARED WITH ECG OF 11-Nov-2023 18:17, INVERTED T WAVES HAVE REPLACED NONSPECIFIC T WAVE ABNORMALITY IN LATERAL LEADS Confirmed by Eldred Manges ([id number]) on 11/12/2023 10:58:47 AM    ECG 12 Lead  Result Date: 11/12/2023  SINUS RHYTHM WITH PREMATURE SUPRAVENTRICULAR BEATS LEFT ANTERIOR FASCICULAR BLOCK ABNORMAL ECG WHEN COMPARED WITH ECG OF 17-Sep-2023 12:17, PREMATURE SUPRAVENTRICULAR BEATS ARE NOW PRESENT Confirmed by Eldred Manges ([id number]) on 11/12/2023 10:57:06 AM    CTA Chest W Contrast  Result Date: 11/12/2023  EXAM: CTA CHEST W CONTRAST ACCESSION: [id number] UN REPORT DATE: 11/12/2023 12:59 AM CLINICAL INDICATION: Acute onset L - sided pleuritic chest pain TECHNIQUE: Contiguous  axial images were reconstructed through the chest following a single breath hold helical acquisition during the administration of intravenous contrast material. Images were reformatted in the coronal and sagittal planes. MIP slabs were also constructed. COMPARISON: CT 09/23/2022 and prior; radiograph 11/11/2023 FINDINGS: PULMONARY ARTERIES: No emboli in either lung. Unchanged caliber of the central pulmonary arteries. HEART AND VASCULATURE: Heart size is normal. No thoracic aortic aneurysm. Atherosclerotic calcifications in the aorta and coronary arteries. No pericardial effusion. LUNGS, AIRWAYS, AND PLEURA: Unchanged 0.9 cm right lower lobe solid nodule. Mild mosaic attenuation of the lung parenchyma. Subsegmental atelectasis. No pleural effusion or pneumothorax. MEDIASTINUM AND LYMPH NODES: No enlarged intrathoracic, axillary, or supraclavicular lymph nodes. No mediastinal mass. Sliding-type hiatal hernia. CHEST WALL AND BONES: Advanced degenerative changes in the thoracic spine. UPPER ABDOMEN: Fat-containing posteromedial diaphragmatic hernias. OTHER: No other findings.     1. No evidence of pulmonary embolism. 2. Mosaic attenuation of the lung parenchyma, favored to reflect air trapping and/or atelectasis. No focal pneumonia. 3. Chronic and incidental findings as noted above.    XR Chest 2 views  Result Date: 11/11/2023  EXAM: XR CHEST 2 VIEWS ACCESSION: [id number] UN REPORT DATE: 11/11/2023 8:49 PM CLINICAL INDICATION: Abnormal EKG TECHNIQUE: PA and Lateral Chest Radiographs. COMPARISON: 09/17/2023 FINDINGS: Unchanged cardiac silhouette size. Linear scarring at the left lung base. No lobar consolidation. No sizable pleural effusion or pneumothorax.     No acute abnormality.    ECG 12 Lead  Rate: 78 PR: 200 QRS: 76 QT: 378 QTc: 430 P Axis: 80 R Axis: -56 T Axis: 83 Rhythm: Sinus rhythm with PVCs Pulmonary disease pattern Left anterior fascicular block Voltage criteria for LVH Comparison: PVCs new compared to 09/17/2023     MRI brain with and without contrast  Result Date: 11/11/2023  MRI BRAIN WITHOUT AND WITH CONTRAST INDICATION: Headache, chronic, new features or increased frequency, R51.9 Headache, unspecified   COMPARISON: 01/14/2023 MRI cervical spine TECHNIQUE/PROTOCOL: Standard adult brain protocol without and with IV contrast. FINDINGS: Brain Parenchyma:  No hemorrhage, cerebral edema, acute cortical infarction, mass, mass effect, or midline shift.  No abnormal enhancement. Moderate deep and periventricular white matter T2/FLAIR hyperintensities commonly associated with chronic microvascular ischemic change. Ventricles and Sulci: Normal for age.   Extra-Axial Spaces: No extra-axial fluid collection. Basal Cisterns: Normal. Intracranial Flow-Voids: Normal. Paranasal Sinuses: Normal. Mastoid air cells: well aerated Orbits: Bilateral lens replacements. Cranium: Normal. Visualized upper cervical spine: Mild spinal canal narrowing at C3-C4, unchanged from prior 01/14/2023 MRI. IMPRESSION: No etiology for headache identified.   Electronically Reviewed by:  Arminda Resides, MD, [facility] Electronically Reviewed on:  11/11/2023 4:04 PM I have reviewed the images and concur with the above findings. Electronically Signed by:  Cherlynn Perches, MD, [facility] Electronically Signed on:  11/11/2023 4:13 PM        [facility]  [place] [facility]  [address] [place], [place] [address]  Telephone [phone]  Fax [phone]  [email or web address]

## 2023-12-16 ENCOUNTER — Inpatient Hospital Stay: Admit: 2023-12-16 | Discharge: 2023-12-17 | Payer: MEDICARE

## 2023-12-16 ENCOUNTER — Ambulatory Visit: Admit: 2023-12-16 | Discharge: 2023-12-17 | Payer: MEDICARE

## 2023-12-16 DIAGNOSIS — T148XXA Other injury of unspecified body region, initial encounter: Principal | ICD-10-CM

## 2023-12-16 DIAGNOSIS — W19XXXD Unspecified fall, subsequent encounter: Principal | ICD-10-CM

## 2023-12-16 DIAGNOSIS — R109 Unspecified abdominal pain: Principal | ICD-10-CM

## 2023-12-16 NOTE — Unmapped (Signed)
 [facility] CLINIC NOTE    ASSESSMENT/PLAN:  Assessment & Plan  Left mid back pain  Left mid back pain likely due to mild scoliosis and arthritis vs musculoskeletal strain after fall one month ago, exacerbated by movement. No fractures on imaging. Temporary relief with fosfomycin suggests non-infectious etiology. Prefers non-pharmacological management.  - Recommend acetaminophen as needed for pain relief.  - Apply lidocaine patch to the area of pain as needed.  - Apply warm compress to the area of pain three times daily for twenty minutes.  - Consider physical therapy if pain does not improve.  - Provide daily back exercises to alleviate pain.  - Advise returning to the clinic if symptoms worsen or do not improve.      Chief Complaint   Patient presents with    Pain     Pt states she started having pain again on the left side of her back after finishing medication prescribed back on 12/09/23 from previous visit         SUBJECTIVE:  History of Present Illness  Kayla Knight is an 83 year old female who presents with persistent left mid back pain.    She experiences persistent pain in the left mid back area, which is constant and significantly impacts her daily activities. The pain has been occurring for about 2 weeks. The pain increases with bending over, making tasks like sitting on the toilet and picking items up from the floor difficult.    Approximately a month ago, she had a fall, which she believes may have contributed to her current symptoms. She initially sought emergency care, fearing a heart attack, but was diagnosed with a pulled chest muscle after undergoing various diagnostic tests, including MRI.    She recalls a recent episode where she was treated with a single dose of fosfomycin for a suspected bladder infection, which temporarily relieved her pain. However, the pain returned, though not as severe as initially experienced.    She avoids taking any pain relievers, including Tylenol, as she prefers to avoid medication if possible.    During the review of symptoms, the pain does worsen with certain movements and when pressing on the affected region.      I have reviewed the patients problem list, medical history, surgical history, laboratory history and recent hospitalizations, current medications, allergies, and social history and updated them as needed.    Review of symptoms:  Negative unless  Otherwise stated in HPI.     Kayla Knight  reports that she quit smoking about 39 years ago. Her smoking use included cigarettes. She has been exposed to tobacco smoke. She has never used smokeless tobacco.    OBJECTIVE:    VITALS:   Vitals:    12/16/23 0924   BP: 130/60   Pulse: 61   Temp: 36.4 ??C (97.5 ??F)   SpO2: 99%    Wt:   Wt Readings from Last 3 Encounters:   12/16/23 67.6 kg (149 lb)   12/09/23 68 kg (150 lb)   11/11/23 70.7 kg (155 lb 12.8 oz)       Gen: Well appearing, pleasant and cooperative in NAD, appears stated age  Head: Normocephalic, atraumatic   Resp:  No increased work of breathing  Msk: No tenderness to percussion of the thoracic spine. No tenderness to palpation of the thoracic paraspinal muscles. Tenderness to palpation at the left rib lateral to left flank  Neuro:  A&O x 4,  normal gait  Skin: Warm and dry, no rash  present on the back  Psych:  Mood is good, able to carry on normal conversation, with good eye contact, without evidence of anxiety or depression    LABS:  Results for orders placed or performed in visit on 12/16/23   POCT Urinalysis Dipstick   Result Value Ref Range    Spec Gravity/POC 1.015 1.003 - 1.030    PH/POC 6.5 5.0 - 9.0    Leuk Esterase/POC Trace (A) Negative    Nitrite/POC Negative Negative    Protein/POC Negative Negative    UA Glucose/POC Negative Negative    Ketones, POC Negative Negative    Bilirubin/POC Negative Negative    Blood/POC Trace-intact (A) Negative    Urobilinogen/POC 0.2 0.2 - 1.0 mg/dL       STUDIES:  Impression   No acute displaced left rib fractures. For findings within the thoracic spine, please see concurrently dictated dedicated thoracic spine radiographs.      Moderate-severe left glenohumeral joint degenerative changes.     Narrative   EXAM: XR RIBS LEFT W PA CHEST   DATE: 12/16/2023 10:00 AM   ACCESSION: [id number] UN   DICTATED: 12/16/2023 10:03 AM   INTERPRETATION LOCATION: [facility]      CLINICAL INDICATION: 83 years old Female with Left flank pain and tenderness on rib after fall  - R10.9 - Left flank pain - W19.XXXD - Fall, subsequent encounter          COMPARISON: CTA 11/12/2023, 11/11/2023      TECHNIQUE:  AP and posterior oblique views of the left ribs. PA view of the chest.      FINDINGS:   No acute displaced rib fracture. Calcification overlies the left axilla, similar to comparison CTA. Moderate-severe left glenohumeral joint degenerative changes. For findings within the thoracic spine please see concurrently dictated dedicated thoracic spine radiographs. Lungs are clear. No pleural effusion or pneumothorax. Tortuous calcified thoracic aorta.     Impression   No new vertebral body height loss or spondylolisthesis.      Similar moderate multilevel degenerative disc disease of the thoracic spine with reverse S-shaped thoracolumbar spinal curvature.     Narrative   EXAM: XR THORACIC SPINE 3 VIEWS   DATE: 12/16/2023 9:59 AM   ACCESSION: [id number] UN   DICTATED: 12/16/2023 10:02 AM   INTERPRETATION LOCATION: [facility]      CLINICAL INDICATION: 83 years old Female with left flank pain after fall  - R10.9 - Left flank pain - W19.XXXD - Fall, subsequent encounter        COMPARISON: CTA chest 11/12/2023, 11/11/2023, 09/23/2022      TECHNIQUE: AP and lateral views of the thoracic spine. Swimmer's lateral view of the cervicothoracic spine.      FINDINGS:   Vertebral body heights are maintained. There is similar mild-moderate reverse S-shaped curvature of the thoracolumbar spine. Normal thoracic kyphosis. Multilevel endplate degenerative changes of the thoracic spine. No spondylolisthesis. Tortuous thoracic aorta with calcifications. Imaged portions of the hemithoraces are within normal limits.        Results  RADIOLOGY  Thoracic spine X-ray: No compression fractures, mild scoliosis, mild degenerative changes (12/16/2023)  Rib X-ray: No fractures (12/16/2023)      ___________________________________  CURRENT MEDS:  Current Outpatient Medications   Medication Sig Dispense Refill    amlodipine (NORVASC) 10 MG tablet Take 1 tablet (10 mg total) by mouth daily. 90 tablet 3    clopidogrel (PLAVIX) 75 mg tablet TAKE 1 TABLET (  75 MG TOTAL) BY MOUTH DAILY. 90 tablet 2    empty container (SHARPS-A-GATOR DISPOSAL SYSTEM) Misc Use as directed for sharps disposal 1 each 2    evolocumab (REPATHA SURECLICK) 140 mg/mL PnIj Inject the contents of 1 pen (140 mg) under the skin every fourteen (14) days. 6 mL 3    famotidine (PEPCID) 20 MG tablet Take 1 tablet (20 mg total) by mouth two (2) times a day. 180 tablet 3    mag hydrox/aluminum hyd/simeth (ANTACID ANTI-GAS ORAL) Take by mouth.      spironolactone (ALDACTONE) 50 MG tablet TAKE 1 TABLET (50 MG TOTAL) BY MOUTH DAILY. 90 tablet 2    timolol (TIMOPTIC) 0.25 % ophthalmic solution Administer 1 drop to both eyes daily. 5 mL 5     No current facility-administered medications for this visit.       ___________________________________  ALLERGIES:  Allergies   Allergen Reactions    Penicillins Itching and Swelling    Lisinopril Cough    Statins-Hmg-Coa Reductase Inhibitors      Ineffective at controlling cholesterol when tried in the past.    Sulfa (Sulfonamide Antibiotics) Itching     ------------------------    PAST MEDICAL HISTORY:   Past Medical History:   Diagnosis Date    Anemia     past history, over 40 yrs ago    Cataract 2008    Surgery    Diabetes mellitus (CMS-HCC)     Type II, on 9/15 patient stated she was not longer diabetic, not taking meds    Dry eye     Hearing impairment     Heart murmur 2016    [facility] Verdie [facility]    Heart valve disease     Heart mumur, pt reports diagnosis 10 yrs ago (05/10/20)    History of stent insertion of renal artery 01/29/2020    ASA indefinitely and plavix x 6 months per procedure note    Hypertension     pt reports taking meds since about 1995    Neuromuscular disorder (CMS-HCC) 2014    Feet & Legs    Tear of meniscus of knee     4.5 yrs ago    Urinary incontinence     seen a doctor about nighttime bathroom use (adressed in 2019)         FMURGENTCARETRACKING       Did today's visit save an ED/Direct Admission?  yes    Follow-up with PCP      [facility]  [address], [place], [place] [address]  Telephone [phone]  Fax [phone]  [email or web address]

## 2023-12-16 NOTE — Unmapped (Addendum)
 It was a pleasure seeing you today.  Please take Tylenol as directed as needed for back/side pain.  Apply a lidocaine patch to area of pain as needed.  Apply a warm compress to area of pain for 20 minutes three times daily.  Return to clinic if symptoms worsen or do not improve.

## 2023-12-20 ENCOUNTER — Emergency Department: Admit: 2023-12-20 | Discharge: 2023-12-21 | Discharge status: Against Medical Advice | Payer: MEDICARE

## 2023-12-20 ENCOUNTER — Emergency Department: Admit: 2023-12-20 | Discharge: 2023-12-21 | Discharge status: Against Medical Advice

## 2023-12-20 LAB — COMPREHENSIVE METABOLIC PANEL
ALBUMIN: 3.8 g/dL (ref 3.4–5.0)
ALKALINE PHOSPHATASE: 91 U/L (ref 46–116)
ALT (SGPT): 9 U/L — ABNORMAL LOW (ref 10–49)
ANION GAP: 13 mmol/L (ref 5–14)
AST (SGOT): 18 U/L (ref <=34)
BILIRUBIN TOTAL: 0.4 mg/dL (ref 0.3–1.2)
BLOOD UREA NITROGEN: 13 mg/dL (ref 9–23)
BUN / CREAT RATIO: 14
CALCIUM: 9.7 mg/dL (ref 8.7–10.4)
CHLORIDE: 103 mmol/L (ref 98–107)
CO2: 27 mmol/L (ref 20.0–31.0)
CREATININE: 0.94 mg/dL (ref 0.55–1.02)
EGFR CKD-EPI (2021) FEMALE: 61 mL/min/{1.73_m2} (ref >=60)
GLUCOSE RANDOM: 92 mg/dL (ref 70–179)
POTASSIUM: 4.2 mmol/L (ref 3.4–4.8)
PROTEIN TOTAL: 7.8 g/dL (ref 5.7–8.2)
SODIUM: 143 mmol/L (ref 135–145)

## 2023-12-20 LAB — CBC W/ AUTO DIFF
BASOPHILS ABSOLUTE COUNT: 0.1 10*9/L (ref 0.0–0.1)
BASOPHILS RELATIVE PERCENT: 0.6 %
EOSINOPHILS ABSOLUTE COUNT: 0.1 10*9/L (ref 0.0–0.5)
EOSINOPHILS RELATIVE PERCENT: 1.4 %
HEMATOCRIT: 36.8 % (ref 34.0–44.0)
HEMOGLOBIN: 12 g/dL (ref 11.3–14.9)
LYMPHOCYTES ABSOLUTE COUNT: 2.8 10*9/L (ref 1.1–3.6)
LYMPHOCYTES RELATIVE PERCENT: 31.4 %
MEAN CORPUSCULAR HEMOGLOBIN CONC: 32.7 g/dL (ref 32.0–36.0)
MEAN CORPUSCULAR HEMOGLOBIN: 24.3 pg — ABNORMAL LOW (ref 25.9–32.4)
MEAN CORPUSCULAR VOLUME: 74.3 fL — ABNORMAL LOW (ref 77.6–95.7)
MEAN PLATELET VOLUME: 6.9 fL (ref 6.8–10.7)
MONOCYTES ABSOLUTE COUNT: 0.8 10*9/L (ref 0.3–0.8)
MONOCYTES RELATIVE PERCENT: 8.7 %
NEUTROPHILS ABSOLUTE COUNT: 5.1 10*9/L (ref 1.8–7.8)
NEUTROPHILS RELATIVE PERCENT: 57.9 %
PLATELET COUNT: 396 10*9/L (ref 150–450)
RED BLOOD CELL COUNT: 4.95 10*12/L (ref 3.95–5.13)
RED CELL DISTRIBUTION WIDTH: 16.4 % — ABNORMAL HIGH (ref 12.2–15.2)
WBC ADJUSTED: 8.8 10*9/L (ref 3.6–11.2)

## 2023-12-20 LAB — SLIDE REVIEW

## 2023-12-20 LAB — HIGH SENSITIVITY TROPONIN I - SINGLE: HIGH SENSITIVITY TROPONIN I: 12 ng/L (ref <=34)

## 2023-12-20 LAB — MAGNESIUM: MAGNESIUM: 2.1 mg/dL (ref 1.6–2.6)

## 2023-12-20 LAB — PRO-BNP: PRO-BNP: 206 pg/mL (ref <=300.0)

## 2023-12-20 NOTE — Unmapped (Signed)
 BIB [facility] for midsternal CP that started around 3pm. Denies pain radiating anywhere. PMH: T2DM, MVR. Reports some SOB.     324 Aspirin given enroute

## 2023-12-20 NOTE — Unmapped (Signed)
 BIB [facility] for CP.

## 2023-12-21 NOTE — Unmapped (Signed)
 LWBS/AMA Patient Follow-Up Call  Chart reviewed after patient LWBS after triage. Patient has since been evaluated by PCP, per chart documentation. No further actions needed.

## 2023-12-21 NOTE — Unmapped (Signed)
 FYI

## 2023-12-21 NOTE — Unmapped (Signed)
 Spoke with patient who states she had chest pain yesterday she went to the ER at 3 p.m. yesterday evening and left around 12:30 a.m. because she hadn't seen a provider and was still waiting. Inquired if patient was currently having chest pain. Patient declined and stated that her chest pain subsided this morning. Informed patient of providers recommendation, if chest pain accelerates in the meantime to return to the ER as soon as possible. Patient verbalized understanding.

## 2023-12-21 NOTE — Unmapped (Signed)
 Patient called stating she has been to ED and UC due to chest pain. She left without being seen at ED last night because she was there too long. Patient said she would rather die at home. Patient wanted to know if Dr. Sharlyne Pacas wanted to talk to her. I advised she should be seen and scheduled patient with Dr. PT tomorrow at 3pm.    Waunita Schooner, RN

## 2023-12-22 ENCOUNTER — Ambulatory Visit: Admit: 2023-12-22 | Discharge: 2023-12-23 | Payer: MEDICARE | Attending: Internal Medicine | Primary: Internal Medicine

## 2023-12-22 DIAGNOSIS — Z87898 Personal history of other specified conditions: Principal | ICD-10-CM

## 2023-12-22 DIAGNOSIS — R109 Unspecified abdominal pain: Principal | ICD-10-CM

## 2023-12-22 DIAGNOSIS — M419 Scoliosis, unspecified: Principal | ICD-10-CM

## 2023-12-22 NOTE — Unmapped (Signed)
 Assessment and Plan      1. Left flank pain    2. History of chest pain    3. Scoliosis, unspecified scoliosis type, unspecified spinal region        Low suspicion for cardiac ischemia.  ? valvular heart disease  Continue to monitor.    Seems musculoskeletal.  PT referral per orders.        No problem-specific Assessment & Plan notes found for this encounter.      Orders Placed This Encounter   Procedures    Ambulatory referral to Physical Therapy       Requested Prescriptions      No prescriptions requested or ordered in this encounter       There are no discontinued medications.    No follow-ups on file.    Signs and symptoms that should prompt return sooner than scheduled were reviewed with the patient.      [facility] Components:     Medication adherence and barriers to the treatment plan have been addressed. Opportunities to optimize healthy behaviors have been discussed. Patient / caregiver voiced understanding.        HPI      Chief Complaint    Chief Complaint   Patient presents with    Follow-up    Back Pain     Lower back and sides.       Kayla Knight presents for follow up of the following concern(s):      She went to ED night before last with chest pain  but left without being seen.    She had chest pain when she left but it has resolved after she slept.    Left flank pain is ongoing.  It worsens over the course of the day.  She has not been exercising as much as before.  Applying essential oils and heating pad have helped.  CT-scan of abdomen and pelvis and rib films have been unrevealing.  Thoracic spine films showed degenerative disc disease and scoliosis.       Health Maintenance reviewed - no intervention today      I have reviewed the patient's medical history in detail and updated the computerized patient record.              Patient Active Problem List   Diagnosis    Benign essential hypertension    Primary osteoarthritis of right knee    Idiopathic peripheral neuropathy    MR (mitral regurgitation) Type 2 diabetes mellitus without complication, without long-term current use of insulin (CMS-HCC)    Neck pain    Encounter for subsequent annual wellness visit (AWV) in Medicare patient    Lipoma of torso    Skin lesion    Hearing loss    Lichen sclerosus et atrophicus of the vulva    Renal artery stenosis, native, bilateral (CMS-HCC)    Hyperlipidemia, unspecified    Aftercare following joint replacement    Presence of left artificial knee joint    Hypokalemia    History of chest pain    Decreased GFR    Nonrheumatic aortic valve insufficiency    Endometrial thickening on ultrasound    Weakness of both hips    Right hip pain    Hot flashes    Aortic valve disease    Post-nasal drip    Witnessed episode of apnea    Glaucoma suspect of both eyes    Dry eye syndrome, bilateral    PVD (posterior vitreous detachment), bilateral  S/P cataract extraction and insertion of intraocular lens, left    S/P cataract extraction and insertion of intraocular lens, right    Meibomian gland dysfunction (MGD), bilateral, both upper and lower lids    Left flank pain         Allergies:  Penicillins, Lisinopril, Statins-hmg-coa reductase inhibitors, and Sulfa (sulfonamide antibiotics)    Medications:   Outpatient Medications Prior to Visit   Medication Sig Dispense Refill    amlodipine (NORVASC) 10 MG tablet Take 1 tablet (10 mg total) by mouth daily. 90 tablet 3    clopidogrel (PLAVIX) 75 mg tablet TAKE 1 TABLET (75 MG TOTAL) BY MOUTH DAILY. 90 tablet 2    empty container (SHARPS-A-GATOR DISPOSAL SYSTEM) Misc Use as directed for sharps disposal 1 each 2    evolocumab (REPATHA SURECLICK) 140 mg/mL PnIj Inject the contents of 1 pen (140 mg) under the skin every fourteen (14) days. 6 mL 3    famotidine (PEPCID) 20 MG tablet Take 1 tablet (20 mg total) by mouth two (2) times a day. 180 tablet 3    mag hydrox/aluminum hyd/simeth (ANTACID ANTI-GAS ORAL) Take by mouth.      MIEBO, PF, 100 % Drop Administer to both eyes two (2) times a day. spironolactone (ALDACTONE) 50 MG tablet TAKE 1 TABLET (50 MG TOTAL) BY MOUTH DAILY. 90 tablet 2    timolol (TIMOPTIC) 0.25 % ophthalmic solution Administer 1 drop to both eyes daily. 5 mL 5     No facility-administered medications prior to visit.       Medical History:  Past Medical History:   Diagnosis Date    Anemia     past history, over 40 yrs ago    Cataract 2008    Surgery    Diabetes mellitus     Type II, on 9/15 patient stated she was not longer diabetic, not taking meds    Dry eye     Hearing impairment     Heart murmur 2016    [facility]. Verdie [facility]ire @ [facility]    Heart valve disease     Heart mumur, pt reports diagnosis 10 yrs ago (05/10/20)    History of stent insertion of renal artery 01/29/2020    ASA indefinitely and plavix x 6 months per procedure note    Hypertension     pt reports taking meds since about 1995    Neuromuscular disorder 2014    Feet & Legs    Tear of meniscus of knee     4.5 yrs ago    Urinary incontinence     seen a doctor about nighttime bathroom use (adressed in 2019)       Surgical History:  Past Surgical History:   Procedure Laterality Date    CATARACT EXTRACTION W/ INTRAOCULAR LENS IMPLANT Left 02/27/2016    EYE SURGERY  01/30/16    Cataract surgery is scheduled.    JOINT REPLACEMENT  Knee    KNEE CARTILAGE SURGERY Right 2007, 2008    x 2    KNEE SURGERY      pt reports total left knee joint replacement surgery around 2016, 2017    PLANTAR FASCIECTOMY Left     PR REVASCULARIZE FEM/POP ARTERY,ANGIOPLASTY/STENT N/A 01/29/2020    Procedure: Peripheral Angiography W Intervetion;  Surgeon: Alvira Philips, MD;  Location: [facility] CATH;  Service: Cardiology    PR REVASCULARIZE FEM/POP ARTERY,ANGIOPLASTY/STENT Right 12/26/2021    Procedure: right renal artery intervention;  Surgeon: Alvira Philips, MD;  Location: [facility] CATH;  Service: Cardiology    PR UPPER GI ENDOSCOPY,BIOPSY N/A 04/01/2021    Procedure: UGI ENDOSCOPY; WITH BIOPSY, SINGLE OR MULTIPLE;  Surgeon: Jarvis Morgan, MD;  Location: [facility];  Service: Gastroenterology    PR XCAPSL CTRC RMVL INSJ IO LENS PROSTH W/O ECP Right 02/13/2016    Procedure: EXTRACAPSULAR CATARACT REMOVAL W/INSERTION OF INTRAOCULAR LENS PROSTHESIS, MANUAL OR MECHANICAL TECHNIQUE;  Surgeon: Garey Ham, MD;  [facility];  Service: Ophthalmology    PR XCAPSL CTRC RMVL INSJ IO LENS PROSTH W/O ECP Left 02/27/2016    Procedure: EXTRACAPSULAR CATARACT REMOVAL W/INSERTION OF INTRAOCULAR LENS PROSTHESIS, MANUAL OR MECHANICAL TECHNIQUE;  Surgeon: Garey Ham, MD;  Location: [facility] OR [facility];  Service: Ophthalmology    RENAL ARTERY STENT  01/11/2020    ROOT CANAL      TUBAL LIGATION  1973    [facility]    WISDOM TOOTH EXTRACTION         Social History:  Tobacco use:   reports that she quit smoking about 39 years ago. Her smoking use included cigarettes. She has been exposed to tobacco smoke. She has never used smokeless tobacco.  Alcohol use:   reports current alcohol use.  Drug use:  reports no history of drug use.      Family History:  Family History   Problem Relation Age of Onset    Heart disease Mother     Hypertension Mother     Dementia Mother     Arthritis Mother     Heart disease Father     Thyroid disease Father     Glaucoma Father     Alcohol abuse Father     Heart attack Father 95    Arthritis Father     Hypertension Father         Massive heart attack    Arthritis Sister     Glaucoma Sister     Hypertension Sister     Diabetes Sister     Diabetes Daughter     Diabetes Paternal Aunt     No Known Problems Brother     No Known Problems Maternal Aunt     No Known Problems Maternal Uncle     No Known Problems Paternal Uncle     No Known Problems Maternal Grandmother     No Known Problems Maternal Grandfather     No Known Problems Paternal Grandmother     No Known Problems Paternal Grandfather     Arthritis Sister         Noticeable in extremities    COPD Sister     Depression Sister Hypertension Sister     Glaucoma Sister     Asthma Daughter         Ocassional    Hypertension Daughter     Miscarriages / Stillbirths Daughter     Cancer Maternal Aunt         Died more than 29yrs ago    Early death Maternal Aunt     Diabetes Paternal Aunt         4 Aunts    Kidney disease Paternal Aunt     Amblyopia Neg Hx     Blindness Neg Hx     Cancer Neg Hx     Cataracts Neg Hx     Macular degeneration Neg Hx     Retinal detachment Neg Hx     Strabismus Neg Hx  Stroke Neg Hx            Review of Systems:    Pertinent review of systems is noted in the HPI. A comprehensive review of systems was otherwise negative.          Physical Exam      BP 130/62 (BP Site: L Arm, BP Position: Sitting, BP Cuff Size: Medium)  - Pulse 72  - Temp 37 ??C (98.6 ??F) (Oral)  - Wt 68.4 kg (150 lb 12.8 oz)  - SpO2 97%  - BMI 24.35 kg/m??       BP Readings from Last 3 Encounters:   12/22/23 130/62   12/16/23 130/60   12/09/23 122/60       Wt Readings from Last 3 Encounters:   12/22/23 68.4 kg (150 lb 12.8 oz)   12/16/23 67.6 kg (149 lb)   12/09/23 68 kg (150 lb)       She appears well, in no apparent distress.  Alert and oriented times three, pleasant and cooperative. Vital signs are as documented in vital signs section. Cor is regular rate and rhythm with 2/6 harsh systolic murmur.  No chest wall tenderness.  Lungs are clear throughout with normal respiratory effort.    Tenderness along left costal margin.  No lower extremity edema.

## 2023-12-27 ENCOUNTER — Emergency Department: Admit: 2023-12-27 | Discharge: 2023-12-28 | Disposition: A | Discharge status: Home / Self Care | Attending: Family

## 2023-12-27 DIAGNOSIS — R079 Chest pain, unspecified: Principal | ICD-10-CM

## 2023-12-27 LAB — COMPREHENSIVE METABOLIC PANEL
ALBUMIN: 3.6 g/dL (ref 3.4–5.0)
ALKALINE PHOSPHATASE: 99 U/L (ref 46–116)
ALT (SGPT): <7 U/L — ABNORMAL LOW (ref 10–49)
ANION GAP: 11 mmol/L (ref 5–14)
AST (SGOT): 15 U/L (ref <=34)
BILIRUBIN TOTAL: 0.3 mg/dL (ref 0.3–1.2)
BLOOD UREA NITROGEN: 23 mg/dL (ref 9–23)
BUN / CREAT RATIO: 22
CALCIUM: 9.9 mg/dL (ref 8.7–10.4)
CHLORIDE: 103 mmol/L (ref 98–107)
CO2: 25.7 mmol/L (ref 20.0–31.0)
CREATININE: 1.04 mg/dL — ABNORMAL HIGH (ref 0.55–1.02)
EGFR CKD-EPI (2021) FEMALE: 54 mL/min/1.73m2 — ABNORMAL LOW (ref >=60)
GLUCOSE RANDOM: 123 mg/dL (ref 70–179)
POTASSIUM: 4.3 mmol/L (ref 3.4–4.8)
PROTEIN TOTAL: 7.8 g/dL (ref 5.7–8.2)
SODIUM: 140 mmol/L (ref 135–145)

## 2023-12-27 LAB — CBC W/ AUTO DIFF
BASOPHILS ABSOLUTE COUNT: 0.1 10*9/L (ref 0.0–0.1)
BASOPHILS RELATIVE PERCENT: 0.7 %
EOSINOPHILS ABSOLUTE COUNT: 0.1 10*9/L (ref 0.0–0.5)
EOSINOPHILS RELATIVE PERCENT: 1.9 %
HEMATOCRIT: 36.3 % (ref 34.0–44.0)
HEMOGLOBIN: 12.2 g/dL (ref 11.3–14.9)
LYMPHOCYTES ABSOLUTE COUNT: 2.2 10*9/L (ref 1.1–3.6)
LYMPHOCYTES RELATIVE PERCENT: 28.2 %
MEAN CORPUSCULAR HEMOGLOBIN CONC: 33.7 g/dL (ref 32.0–36.0)
MEAN CORPUSCULAR HEMOGLOBIN: 24.8 pg — ABNORMAL LOW (ref 25.9–32.4)
MEAN CORPUSCULAR VOLUME: 73.5 fL — ABNORMAL LOW (ref 77.6–95.7)
MEAN PLATELET VOLUME: 6.6 fL — ABNORMAL LOW (ref 6.8–10.7)
MONOCYTES ABSOLUTE COUNT: 0.7 10*9/L (ref 0.3–0.8)
MONOCYTES RELATIVE PERCENT: 8.7 %
NEUTROPHILS ABSOLUTE COUNT: 4.6 10*9/L (ref 1.8–7.8)
NEUTROPHILS RELATIVE PERCENT: 60.5 %
NUCLEATED RED BLOOD CELLS: 0 /100{WBCs} (ref <=4)
PLATELET COUNT: 456 10*9/L — ABNORMAL HIGH (ref 150–450)
RED BLOOD CELL COUNT: 4.93 10*12/L (ref 3.95–5.13)
RED CELL DISTRIBUTION WIDTH: 16.2 % — ABNORMAL HIGH (ref 12.2–15.2)
WBC ADJUSTED: 7.7 10*9/L (ref 3.6–11.2)

## 2023-12-27 LAB — PROTIME-INR
INR: 1.04
PROTIME: 11.9 s (ref 9.9–12.6)

## 2023-12-27 LAB — HIGH SENSITIVITY TROPONIN I - 2 HOUR SERIAL
HIGH SENSITIVITY TROPONIN - DELTA (0-2H): 1 ng/L (ref <=7)
HIGH-SENSITIVITY TROPONIN I - 2 HOUR: 9 ng/L (ref <=34)

## 2023-12-27 LAB — HIGH SENSITIVITY TROPONIN I - SERIAL: HIGH SENSITIVITY TROPONIN I: 8 ng/L (ref <=34)

## 2023-12-27 NOTE — Unmapped (Signed)
 [facility]  Emergency Department Provider Note      ED Clinical Impression      Final diagnoses:   Chest pain, unspecified type (Primary)            Impression, Medical Decision Making, Progress Notes and Critical Care      Impression, Differential Diagnosis and Plan of Care      Kayla Knight is a 83 y.o. female with a past medical history of T2DM, HTN and a heart murmur who presents to the emergency department due to wheezing, shortness of breath and chest pain. The wheezing is a new development today per patient, however shortness of breath and chest pain have been ongoing for approximately 1 month after a mechanical fall while playing ping pong. She has been evaluated by her PCP for similar symptoms and work up of possible rib fracture. On exam she is afebrile and well-appearing, cardiac exam remarkable for longstanding systolic murmur, lungs are clear to auscultation, and there is no lower extremity edema. Work up to include EKG, troponin x2, INR, CMP and CBC. Chest X-ray was ordered however patient denied imaging as she recently had it done on 3/24 and did not feel it was warranted today.     CMP was remarkable for a mildly elevated Creatinine and decreased eGFR from baseline. CBC with longstanding changes, remarkable today only for mild elevation of 456. Troponin negative x2. EKG: NSR, left anterior fascicular block, minimal voltage criteria for LVH, may be normal variant.     Given reassuring diagnostic work up including negative troponin x2 and normal EKG, patient's symptoms are most likely secondary to prior chest wall contusion. Unable to fully evaluate cardiopulmonary differentials given patient's hesitancy for repeat chest x-ray, however given benign physical exam, patient can be discharged home with emphasis on close follow up with PCP and strict return precautions outlined. Patient is agreeable to the plan of care and is stable for discharge at this time.       Portions of this record have been created using Scientist, clinical (histocompatibility and immunogenetics). Dictation errors have been sought, but may not have been identified and corrected.    See chart and resident provider documentation for details.    ____________________________________________         History        Reason for Visit  Chest Pain and Shortness of Breath      HPI   Kayla Knight is a 83 y.o. female with a past medical history of T2DM, HTN and a heart murmur who presents to the emergency department due to wheezing, shortness of breath and chest pain. She endorses that her chest pain and shortness of breath began following a fall approximately 1 month ago. Her wheezing is a new development this morning, which she believes is attributable to a new CPAP mouth piece, however was concerning enough to prompt her visit to the emergency department today.     The chest pain has been intermittent, localized over her left chest wall without radiation and is sharp in nature. She recently presented to the ED on 3/24 for chest pain symptoms and left prior to evaluation. Today, her symptoms have since resolved while in the waiting room and she denies chest pain currently. The shortness of breath is typically worse in the morning, and is now associated with subjective wheezing. She denies fever, chills, sore throat, cough and recent sick contacts. She has no history of asthma, COPD or emphysema. She endorses chronic rhinorrhea at baseline,  which has not changed.     She denies dysuria, hematuria, urinary frequency/urgency, abdominal pain, N/V/D, and lower extremity edema.         External Records Reviewed  (Inpatient/Outpatient notes, Prior labs/imaging studies, Care Everywhere, PDMP, External ED notes, etc)    Reviewed previous ED visits.    Past Medical History:   Diagnosis Date    Anemia     past history, over 40 yrs ago    Cataract 2008    Surgery    Diabetes mellitus     Type II, on 9/15 patient stated she was not longer diabetic, not taking meds    Dry eye Hearing impairment     Heart murmur 2016    Dr. Verdie Shire [facility]    Heart valve disease     Heart mumur, pt reports diagnosis 10 yrs ago (05/10/20)    History of stent insertion of renal artery 01/29/2020    ASA indefinitely and plavix x 6 months per procedure note    Hypertension     pt reports taking meds since about 1995    Neuromuscular disorder 2014    Feet & Legs    Tear of meniscus of knee     4.5 yrs ago    Urinary incontinence     seen a doctor about nighttime bathroom use (adressed in 2019)       Patient Active Problem List   Diagnosis    Benign essential hypertension    Primary osteoarthritis of right knee    Idiopathic peripheral neuropathy    MR (mitral regurgitation)    Type 2 diabetes mellitus without complication, without long-term current use of insulin    Neck pain    Encounter for subsequent annual wellness visit (AWV) in Medicare patient    Lipoma of torso    Skin lesion    Hearing loss    Lichen sclerosus et atrophicus of the vulva    Renal artery stenosis, native, bilateral (CMS-HCC)    Hyperlipidemia, unspecified    Aftercare following joint replacement    Presence of left artificial knee joint    Hypokalemia    History of chest pain    Decreased GFR    Nonrheumatic aortic valve insufficiency    Endometrial thickening on ultrasound    Weakness of both hips    Right hip pain    Hot flashes    Aortic valve disease    Post-nasal drip    Witnessed episode of apnea    Glaucoma suspect of both eyes    Dry eye syndrome, bilateral    PVD (posterior vitreous detachment), bilateral    S/P cataract extraction and insertion of intraocular lens, left    S/P cataract extraction and insertion of intraocular lens, right    Meibomian gland dysfunction (MGD), bilateral, both upper and lower lids    Left flank pain       Past Surgical History:   Procedure Laterality Date    CATARACT EXTRACTION W/ INTRAOCULAR LENS IMPLANT Left 02/27/2016    EYE SURGERY  01/30/16    Cataract surgery is scheduled.    JOINT REPLACEMENT  Knee    KNEE CARTILAGE SURGERY Right 2007, 2008    x 2    KNEE SURGERY      pt reports total left knee joint replacement surgery around 2016, 2017    PLANTAR FASCIECTOMY Left     PR REVASCULARIZE FEM/POP ARTERY,ANGIOPLASTY/STENT N/A 01/29/2020    Procedure: Peripheral Angiography W  Intervetion;  Surgeon: Alvira Philips, MD;  Location: [facility] CATH;  Service: Cardiology    PR REVASCULARIZE FEM/POP ARTERY,ANGIOPLASTY/STENT Right 12/26/2021    Procedure: right renal artery intervention;  Surgeon: Alvira Philips, MD;  Location: [facility] CATH;  Service: Cardiology    PR UPPER GI ENDOSCOPY,BIOPSY N/A 04/01/2021    Procedure: UGI ENDOSCOPY; WITH BIOPSY, SINGLE OR MULTIPLE;  Surgeon: Jarvis Morgan, MD;  Location: [facility] PROCEDURES [facility];  Service: Gastroenterology    PR XCAPSL CTRC RMVL INSJ IO LENS PROSTH W/O ECP Right 02/13/2016    Procedure: EXTRACAPSULAR CATARACT REMOVAL W/INSERTION OF INTRAOCULAR LENS PROSTHESIS, MANUAL OR MECHANICAL TECHNIQUE;  Surgeon: Garey Ham, MD;  Location: [facility];  Service: Ophthalmology    PR XCAPSL CTRC RMVL INSJ IO LENS PROSTH W/O ECP Left 02/27/2016    Procedure: EXTRACAPSULAR CATARACT REMOVAL W/INSERTION OF INTRAOCULAR LENS PROSTHESIS, MANUAL OR MECHANICAL TECHNIQUE;  Surgeon: Garey Ham, MD;  Location: [facility] OR [facility];  Service: Ophthalmology    RENAL ARTERY STENT  01/11/2020    ROOT CANAL      TUBAL LIGATION  1973    [facility]    WISDOM TOOTH EXTRACTION         No current facility-administered medications for this encounter.    Current Outpatient Medications:     amlodipine (NORVASC) 10 MG tablet, Take 1 tablet (10 mg total) by mouth daily., Disp: 90 tablet, Rfl: 3    clopidogrel (PLAVIX) 75 mg tablet, TAKE 1 TABLET (75 MG TOTAL) BY MOUTH DAILY., Disp: 90 tablet, Rfl: 2    empty container (SHARPS-A-GATOR DISPOSAL SYSTEM) Misc, Use as directed for sharps disposal, Disp: 1 each, Rfl: 2    evolocumab (REPATHA SURECLICK) 140 mg/mL PnIj, Inject the contents of 1 pen (140 mg) under the skin every fourteen (14) days., Disp: 6 mL, Rfl: 3    famotidine (PEPCID) 20 MG tablet, Take 1 tablet (20 mg total) by mouth two (2) times a day., Disp: 180 tablet, Rfl: 3    mag hydrox/aluminum hyd/simeth (ANTACID ANTI-GAS ORAL), Take by mouth., Disp: , Rfl:     MIEBO, PF, 100 % Drop, Administer to both eyes two (2) times a day., Disp: , Rfl:     spironolactone (ALDACTONE) 50 MG tablet, TAKE 1 TABLET (50 MG TOTAL) BY MOUTH DAILY., Disp: 90 tablet, Rfl: 2    timolol (TIMOPTIC) 0.25 % ophthalmic solution, Administer 1 drop to both eyes daily., Disp: 5 mL, Rfl: 5    Allergies  Penicillins, Lisinopril, Statins-hmg-coa reductase inhibitors, and Sulfa (sulfonamide antibiotics)    Family History   Problem Relation Age of Onset    Heart disease Mother     Hypertension Mother     Dementia Mother     Arthritis Mother     Heart disease Father     Thyroid disease Father     Glaucoma Father     Alcohol abuse Father     Heart attack Father 67    Arthritis Father     Hypertension Father         Massive heart attack    Arthritis Sister     Glaucoma Sister     Hypertension Sister     Diabetes Sister     Diabetes Daughter     Diabetes Paternal Aunt     No Known Problems Brother     No Known Problems Maternal Aunt     No Known Problems Maternal Uncle     No Known Problems  Paternal Uncle     No Known Problems Maternal Grandmother     No Known Problems Maternal Grandfather     No Known Problems Paternal Grandmother     No Known Problems Paternal Grandfather     Arthritis Sister         Noticeable in extremities    COPD Sister     Depression Sister     Hypertension Sister     Glaucoma Sister     Asthma Daughter         Ocassional    Hypertension Daughter     Miscarriages / Stillbirths Daughter     Cancer Maternal Aunt         Died more than 57yrs ago    Early death Maternal Aunt     Diabetes Paternal Aunt         4 Aunts    Kidney disease Paternal Aunt Amblyopia Neg Hx     Blindness Neg Hx     Cancer Neg Hx     Cataracts Neg Hx     Macular degeneration Neg Hx     Retinal detachment Neg Hx     Strabismus Neg Hx     Stroke Neg Hx        Social History  Social History     Tobacco Use    Smoking status: Former     Current packs/day: 0.00     Types: Cigarettes     Quit date: 05/19/1984     Years since quitting: 39.6     Passive exposure: Past    Smokeless tobacco: Never   Vaping Use    Vaping status: Never Used   Substance Use Topics    Alcohol use: Yes     Comment: occassional    Drug use: Never          Physical Exam       ED Triage Vitals [12/27/23 1450]   Enc Vitals Group      BP 151/68      Heart Rate 73      SpO2 Pulse 73      Resp 17      Temp 36.7 ??C (98 ??F)      Temp Source Temporal      SpO2 97 %      Weight 63.5 kg (140 lb)      Height       Head Circumference       Peak Flow       Pain Score       Pain Loc       Pain Education       Exclude from Growth Chart        Constitutional: Alert and oriented. Well appearing and in no distress.  Eyes: Conjunctivae are normal.  ENT       Head: Normocephalic and atraumatic.       Nose: No congestion.       Mouth/Throat: Mucous membranes are moist.       Neck: No stridor.  Hematological/Lymphatic/Immunilogical: No cervical lymphadenopathy.  Cardiovascular: Normal rate. Systolic murmur, loudest at the left 2nd ICS (long-standing). Normal and symmetric distal pulses are present in all extremities.  Respiratory: Normal respiratory effort. Breath sounds are normal. No wheezing.   Gastrointestinal: Soft and nontender. There is no CVA tenderness.  Musculoskeletal: Normal range of motion in all extremities.       Right lower leg: No tenderness or edema.  Left lower leg: No tenderness or edema.  Neurologic: Normal speech and language. No gross focal neurologic deficits are appreciated.  Skin: Skin is warm, dry and intact. No rash noted.  Psychiatric: Mood and affect are normal. Speech and behavior are normal. Radiology     XR Chest 2 views    (Results Pending)          Labs     Results for orders placed or performed during the hospital encounter of 12/27/23   Comprehensive Metabolic Panel   Result Value Ref Range    Sodium 140 135 - 145 mmol/L    Potassium 4.3 3.4 - 4.8 mmol/L    Chloride 103 98 - 107 mmol/L    CO2 25.7 20.0 - 31.0 mmol/L    Anion Gap 11 5 - 14 mmol/L    BUN 23 9 - 23 mg/dL    Creatinine 1.61 (H) 0.55 - 1.02 mg/dL    BUN/Creatinine Ratio 22     eGFR CKD-EPI (2021) Female 54 (L) >=60 mL/min/1.25m2    Glucose 123 70 - 179 mg/dL    Calcium 9.9 8.7 - 09.6 mg/dL    Albumin 3.6 3.4 - 5.0 g/dL    Total Protein 7.8 5.7 - 8.2 g/dL    Total Bilirubin 0.3 0.3 - 1.2 mg/dL    AST 15 <=04 U/L    ALT <7 (L) 10 - 49 U/L    Alkaline Phosphatase 99 46 - 116 U/L   PT-INR   Result Value Ref Range    PT 11.9 9.9 - 12.6 sec    INR 1.04    hsTroponin I (serial 0-2-6H w/ delta)   Result Value Ref Range    hsTroponin I 8 <=34 ng/L   hsTroponin I - 2 Hour   Result Value Ref Range    hsTroponin I 9 <=34 ng/L    delta hsTroponin I 1 <=7 ng/L   ECG 12 Lead   Result Value Ref Range    EKG Systolic BP  mmHg    EKG Diastolic BP  mmHg    EKG Ventricular Rate 75 BPM    EKG Atrial Rate 75 BPM    EKG P-R Interval 198 ms    EKG QRS Duration 74 ms    EKG Q-T Interval 398 ms    EKG QTC Calculation 444 ms    EKG Calculated P Axis 81 degrees    EKG Calculated R Axis -55 degrees    EKG Calculated T Axis 70 degrees    QTC Fredericia 428 ms   ECG 12 Lead   Result Value Ref Range    EKG Systolic BP  mmHg    EKG Diastolic BP  mmHg    EKG Ventricular Rate 60 BPM    EKG Atrial Rate 60 BPM    EKG P-R Interval 204 ms    EKG QRS Duration 76 ms    EKG Q-T Interval 382 ms    EKG QTC Calculation 382 ms    EKG Calculated P Axis 76 degrees    EKG Calculated R Axis -52 degrees    EKG Calculated T Axis 79 degrees    QTC Fredericia 382 ms   CBC w/ Differential   Result Value Ref Range    WBC 7.7 3.6 - 11.2 10*9/L    RBC 4.93 3.95 - 5.13 10*12/L    HGB 12.2 11.3 - 14.9 g/dL    HCT 54.0 98.1 - 19.1 %    MCV 73.5 (L) 77.6 -  95.7 fL    MCH 24.8 (L) 25.9 - 32.4 pg    MCHC 33.7 32.0 - 36.0 g/dL    RDW 53.6 (H) 64.4 - 15.2 %    MPV 6.6 (L) 6.8 - 10.7 fL    Platelet 456 (H) 150 - 450 10*9/L    nRBC 0 <=4 /100 WBCs    Neutrophils % 60.5 %    Lymphocytes % 28.2 %    Monocytes % 8.7 %    Eosinophils % 1.9 %    Basophils % 0.7 %    Absolute Neutrophils 4.6 1.8 - 7.8 10*9/L    Absolute Lymphocytes 2.2 1.1 - 3.6 10*9/L    Absolute Monocytes 0.7 0.3 - 0.8 10*9/L    Absolute Eosinophils 0.1 0.0 - 0.5 10*9/L    Absolute Basophils 0.1 0.0 - 0.1 10*9/L    Microcytosis Slight (A) Not Present     *Note: Due to a large number of results and/or encounters for the requested time period, some results have not been displayed. A complete set of results can be found in Results Review.       Pertinent labs & imaging results that were available during my care of the patient were reviewed by me and considered in my medical decision making (see chart for details).    Patient seen with [facility] PA student Otho Najjar.               Nicholes Calamity, [place]  12/27/23 2238

## 2023-12-27 NOTE — Unmapped (Signed)
 Pt c/o left chest pain with SOB over the past month, states she had a fall and has been using a new cpap and is concerned that either has affected her, has been to PCP and had neg xray for rib fx.

## 2023-12-31 ENCOUNTER — Ambulatory Visit: Admit: 2023-12-31 | Discharge: 2024-01-01 | Attending: Family | Primary: Family

## 2023-12-31 ENCOUNTER — Inpatient Hospital Stay: Admit: 2023-12-31 | Discharge: 2024-01-01

## 2023-12-31 DIAGNOSIS — R0602 Shortness of breath: Principal | ICD-10-CM

## 2023-12-31 DIAGNOSIS — R079 Chest pain, unspecified: Principal | ICD-10-CM

## 2023-12-31 DIAGNOSIS — R062 Wheezing: Principal | ICD-10-CM

## 2023-12-31 LAB — CREATININE
CREATININE: 1.01 mg/dL (ref 0.55–1.02)
EGFR CKD-EPI (2021) FEMALE: 56 mL/min/{1.73_m2} — ABNORMAL LOW (ref >=60)

## 2023-12-31 LAB — PRO-BNP: PRO-BNP: 153 pg/mL (ref <=300.0)

## 2023-12-31 LAB — HEMOGLOBIN: HEMOGLOBIN: 12.6 g/dL (ref 11.3–14.9)

## 2023-12-31 MED ORDER — ALBUTEROL SULFATE HFA 90 MCG/ACTUATION AEROSOL INHALER
Freq: Four times a day (QID) | RESPIRATORY_TRACT | 0 refills | 0 days | Status: CP | PRN
Start: 2023-12-31 — End: 2024-12-30

## 2023-12-31 NOTE — Unmapped (Signed)
 [facility] CLINIC NOTE      12/31/2023    PCP: Virgina Evener, MD       ASSESSMENT/PLAN:  Any labs and radiology results that were available during my care of the patient were independently reviewed by me and considered in my medical decision making.    Impression:   Kayla Knight is a 83 y.o. female with PMH significant for T2DM, HTN, OA, mitral regurg, bilateral renal artery stenosis, nonrheumatic aortic valve insufficiency, glaucoma, and bilateral posterior vitreous detachment presenting to clinic today regarding the following issues:   Shortness of breath    I did review her prior ER and urgent care notes since her fall in mid February     EKG performed in clinic today interpreted by myself shows NSR, left anterior fascicular block no significant change from prior 5 days ago    My concern today is that since her fall in mid February she has not been taking deep breaths due to pain and she may have developed PNA, she is agreeable to a chest x-ray today    Chest x-ray today shows:No evidence of pneumonia or pulmonary edema     Her chest is currently clear to auscultation however she reports recent wheezing associated with her SOB especially on exertion     Plan:  Will check labs today  Try albuterol INH  Follow up with PCP  Continue supportive care at home    Problem List Items Addressed This Visit    None  Visit Diagnoses         SOB (shortness of breath)    -  Primary    Relevant Orders    ECG 12 Lead (Completed)    XR Chest 2 views (Completed)    Pro-BNP (Completed)    Creatinine (Completed)    Hemoglobin (Completed)      Wheezing          Chest pain, unspecified type                RTC: if symptoms worsen or fail to improve      Follow-up with PCP    Items for PCP to follow-up on next visit: wheezing, SOB, pain under left breast           -----------------------------------------------------------  SUBJECTIVE:  Chief Complaint   Patient presents with Shortness of Breath     Started a month ago . New to sleeping with cpap last night was the first night she sleep with it 8 hours an it seem to help her breathing        HPI:  Kayla Knight is a 83 y.o. female that presents to clinic today regarding the following issues:    History of Present Illness  Kayla Knight is an 83 year old female who presents with breathing problems and shortness of breath.    She has been experiencing breathing problems and shortness of breath for a long time. The symptoms are not related to a recent fall, and she does not believe they indicate a heart attack. She describes pain under her left breast when breathing, which she associates with her breathing difficulties. Yesterday, she experienced significant shortness of breath after leaving a dental office, requiring her to stop three times while walking to her car. She used to walk five miles a day but now struggles to walk around her building without becoming short of breath.    She reports wheezing, particularly upon waking,  and the presence of phlegm. She is unsure if these symptoms are related to her use of a CPAP machine, which she has recently been able to use for a full eight hours at night. No coughing, fever, chills, or swelling in her feet or ankles.    She recalls a fall where she landed on her posterior rib cage, which has made taking deep breaths painful. She expresses concern about the possibility of pneumonia due to the presence of phlegm and difficulty breathing. She also wonders if her lung might be collapsing at times, as she experiences intermittent sensations of something 'collapsing and then coming back.'    She notes that her urine appears dark and mentions a previous bladder infection for which she was treated with a single dose of medication.      ROS: Please see HPI.     -----------------------------------------------------------  PAST MEDICAL HISTORY:   Past Medical History:   Diagnosis Date    Anemia     past history, over 40 yrs ago    Cataract 2008    Surgery    Diabetes mellitus     Type II, on 9/15 patient stated she was not longer diabetic, not taking meds    Dry eye     Hearing impairment     Heart murmur 2016    [facility] Verdie [facility]    Heart valve disease     Heart mumur, pt reports diagnosis 10 yrs ago (05/10/20)    History of stent insertion of renal artery 01/29/2020    ASA indefinitely and plavix x 6 months per procedure note    Hypertension     pt reports taking meds since about 1995    Neuromuscular disorder 2014    Feet & Legs    Tear of meniscus of knee     4.5 yrs ago    Urinary incontinence     seen a doctor about nighttime bathroom use (adressed in 2019)       PAST SURGICAL HISTORY:  Past Surgical History:   Procedure Laterality Date    CATARACT EXTRACTION W/ INTRAOCULAR LENS IMPLANT Left 02/27/2016    EYE SURGERY  01/30/16    Cataract surgery is scheduled.    JOINT REPLACEMENT  Knee    KNEE CARTILAGE SURGERY Right 2007, 2008    x 2    KNEE SURGERY      pt reports total left knee joint replacement surgery around 2016, 2017    PLANTAR FASCIECTOMY Left     PR REVASCULARIZE FEM/POP ARTERY,ANGIOPLASTY/STENT N/A 01/29/2020    Procedure: Peripheral Angiography W Intervetion;  Surgeon: Alvira Philips, MD;  Location: [facility] CATH;  Service: Cardiology    PR REVASCULARIZE FEM/POP ARTERY,ANGIOPLASTY/STENT Right 12/26/2021    Procedure: right renal artery intervention;  Surgeon: Alvira Philips, MD;  Location: [facility] CATH;  Service: Cardiology    PR UPPER GI ENDOSCOPY,BIOPSY N/A 04/01/2021    Procedure: UGI ENDOSCOPY; WITH BIOPSY, SINGLE OR MULTIPLE;  Surgeon: Jarvis Morgan, MD;  Location: [facility]ED[facility];  Service: Gastroenterology    PR XCAPSL CTRC RMVL INSJ IO LENS PROSTH W/O ECP Right 02/13/2016    Procedure: EXTRACAPSULAR CATARACT REMOVAL W/INSERTION OF INTRAOCULAR LENS PROSTHESIS, MANUAL OR MECHANICAL TECHNIQUE;  Surgeon: Garey Ham, MD;  [facility];  Service: Ophthalmology    PR XCAPSL CTRC RMVL INSJ IO LENS PROSTH W/O ECP Left 02/27/2016    Procedure: EXTRACAPSULAR CATARACT REMOVAL W/INSERTION OF INTRAOCULAR LENS PROSTHESIS,  MANUAL OR MECHANICAL TECHNIQUE;  Surgeon: Garey Ham, MD;  [facility]: [facility] OR [facility];  Service: Ophthalmology    RENAL ARTERY STENT  01/11/2020    ROOT CANAL      TUBAL LIGATION  1973    [facility]    WISDOM TOOTH EXTRACTION         SOCIAL HISTORY:  Social History     Socioeconomic History    Marital status: Single   Tobacco Use    Smoking status: Former     Current packs/day: 0.00     Types: Cigarettes     Quit date: 05/19/1984     Years since quitting: 39.6     Passive exposure: Past    Smokeless tobacco: Never   Vaping Use    Vaping status: Never Used   Substance and Sexual Activity    Alcohol use: Yes     Comment: occassional    Drug use: Never    Sexual activity: Not Currently     Partners: Male   Social History Narrative    Exercise: water aerobics 3-4 x week for an hour        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        Dentist: [facility] 2018     Social Drivers of Health     Financial Resource Strain: Low Risk  (01/04/2023)    Overall Financial Resource Strain (CARDIA)     Difficulty of Paying Living Expenses: Not hard at all   Food Insecurity: No Food Insecurity (01/04/2023)    Hunger Vital Sign     Worried About Running Out of Food in the Last Year: Never true     Ran Out of Food in the Last Year: Never true   Transportation Needs: No Transportation Needs (01/04/2023)    PRAPARE - Therapist, art (Medical): No     Lack of Transportation (Non-Medical): No   Physical Activity: Sufficiently Active (01/04/2023)    Exercise Vital Sign     Days of Exercise per Week: 7 days     Minutes of Exercise per Session: 30 min   Stress: No Stress Concern Present (01/04/2023)    [employer] of Occupational Health - Occupational Stress Questionnaire     Feeling of Stress : Only a little   Social Connections: Moderately Isolated (01/04/2023)    Social Connection and Isolation Panel [NHANES]     Frequency of Communication with Friends and Family: More than three times a week     Frequency of Social Gatherings with Friends and Family: More than three times a week     Attends Religious Services: Never     Database administrator or Organizations: Yes     Attends Engineer, structural: More than 4 times per year     Marital Status: Divorced   Housing: Unknown (11/02/2023)    Received from [employer] Stability Vital Sign     Homeless in the Last Year: No       FAMILY HISTORY:  Family History   Problem Relation Age of Onset    Heart disease Mother     Hypertension Mother     Dementia Mother     Arthritis Mother     Heart disease Father     Thyroid disease Father     Glaucoma Father     Alcohol abuse Father     Heart attack Father 12  Arthritis Father     Hypertension Father         Massive heart attack    Arthritis Sister     Glaucoma Sister     Hypertension Sister     Diabetes Sister     Diabetes Daughter     Diabetes Paternal Aunt     No Known Problems Brother     No Known Problems Maternal Aunt     No Known Problems Maternal Uncle     No Known Problems Paternal Uncle     No Known Problems Maternal Grandmother     No Known Problems Maternal Grandfather     No Known Problems Paternal Grandmother     No Known Problems Paternal Grandfather     Arthritis Sister         Noticeable in extremities    COPD Sister     Depression Sister     Hypertension Sister     Glaucoma Sister     Asthma Daughter         Ocassional    Hypertension Daughter     Miscarriages / [place] Daughter     Cancer Maternal Aunt         Died more than 44yrs ago    Early death Maternal Aunt     Diabetes Paternal Aunt         4 Aunts    Kidney disease Paternal Aunt     Amblyopia Neg Hx     Blindness Neg Hx     Cancer Neg Hx     Cataracts Neg Hx     Macular degeneration Neg Hx     Retinal detachment Neg Hx Strabismus Neg Hx     Stroke Neg Hx        ___________________________________  CURRENT MEDS:  Current Outpatient Medications   Medication Sig Dispense Refill    amlodipine (NORVASC) 10 MG tablet Take 1 tablet (10 mg total) by mouth daily. 90 tablet 3    clopidogrel (PLAVIX) 75 mg tablet TAKE 1 TABLET (75 MG TOTAL) BY MOUTH DAILY. 90 tablet 2    empty container (SHARPS-A-GATOR DISPOSAL SYSTEM) Misc Use as directed for sharps disposal 1 each 2    evolocumab (REPATHA SURECLICK) 140 mg/mL PnIj Inject the contents of 1 pen (140 mg) under the skin every fourteen (14) days. 6 mL 3    famotidine (PEPCID) 20 MG tablet Take 1 tablet (20 mg total) by mouth two (2) times a day. 180 tablet 3    fosfomycin (MONUROL) 3 gram Pack Take 3 g by mouth once for 1 dose. 3 g 0    mag hydrox/aluminum hyd/simeth (ANTACID ANTI-GAS ORAL) Take by mouth.      MIEBO, PF, 100 % Drop Administer to both eyes two (2) times a day.      spironolactone (ALDACTONE) 50 MG tablet TAKE 1 TABLET (50 MG TOTAL) BY MOUTH DAILY. 90 tablet 2    timolol (TIMOPTIC) 0.25 % ophthalmic solution Administer 1 drop to both eyes daily. 5 mL 5    albuterol HFA 90 mcg/actuation inhaler Inhale 2 puffs every six (6) hours as needed. 8 g 0     No current facility-administered medications for this visit.       ___________________________________  ALLERGIES:  Allergies   Allergen Reactions    Penicillins Itching and Swelling    Lisinopril Cough    Statins-Hmg-Coa Reductase Inhibitors      Ineffective at controlling cholesterol when tried in the past.  Sulfa (Sulfonamide Antibiotics) Itching     -----------------------------------------------------------  OBJECTIVE:  Physical Exam:  VITALS:   Vitals:    12/31/23 0854   BP: 167/81   Pulse: 74   Temp: 37.1 ??C (98.8 ??F)   SpO2: 96%    Wt:   Wt Readings from Last 3 Encounters:   12/31/23 67.6 kg (149 lb)   12/27/23 63.5 kg (140 lb)   12/22/23 68.4 kg (150 lb 12.8 oz)       PHQ-2 Score:           Physical Exam  Vitals and nursing note reviewed.   Constitutional:       Appearance: Normal appearance. She is not toxic-appearing.   Eyes:      Conjunctiva/sclera: Conjunctivae normal.   Cardiovascular:      Rate and Rhythm: Normal rate and regular rhythm.      Pulses: Normal pulses.      Heart sounds: Normal heart sounds.   Pulmonary:      Effort: Pulmonary effort is normal.      Breath sounds: Normal breath sounds. No wheezing.   Musculoskeletal:         General: Normal range of motion.      Right lower leg: No edema.      Left lower leg: No edema.   Skin:     General: Skin is warm and dry.      Capillary Refill: Capillary refill takes 2 to 3 seconds.   Neurological:      General: No focal deficit present.      Mental Status: She is alert and oriented to person, place, and time.   Psychiatric:         Mood and Affect: Mood normal.         Behavior: Behavior normal.         Thought Content: Thought content normal.         Judgment: Judgment normal.               LABS:  Office Visit on 12/31/2023   Component Date Value Ref Range Status    PRO-BNP 12/31/2023 153.0  <=300.0 pg/mL Final    Creatinine 12/31/2023 1.01  0.55 - 1.02 mg/dL Final    eGFR CKD-EPI (2021) Female 12/31/2023 56 (L)  >=60 mL/min/1.62m2 Final    HGB 12/31/2023 12.6  11.3 - 14.9 g/dL Final   Admission on 95/28/4132, Discharged on 12/27/2023   Component Date Value Ref Range Status    Sodium 12/27/2023 140  135 - 145 mmol/L Final    Potassium 12/27/2023 4.3  3.4 - 4.8 mmol/L Final    Chloride 12/27/2023 103  98 - 107 mmol/L Final    CO2 12/27/2023 25.7  20.0 - 31.0 mmol/L Final    Anion Gap 12/27/2023 11  5 - 14 mmol/L Final    BUN 12/27/2023 23  9 - 23 mg/dL Final    Creatinine 44/09/270 1.04 (H)  0.55 - 1.02 mg/dL Final    BUN/Creatinine Ratio 12/27/2023 22   Final    eGFR CKD-EPI (2021) Female 12/27/2023 54 (L)  >=60 mL/min/1.43m2 Final    Glucose 12/27/2023 123  70 - 179 mg/dL Final    Calcium 53/66/4403 9.9  8.7 - 10.4 mg/dL Final    Albumin 47/42/5956 3.6  3.4 - 5.0 g/dL Final Total Protein 38/75/6433 7.8  5.7 - 8.2 g/dL Final    Total Bilirubin 12/27/2023 0.3  0.3 - 1.2 mg/dL Final    AST 29/51/8841  15  <=34 U/L Final    ALT 12/27/2023 <7 (L)  10 - 49 U/L Final    Alkaline Phosphatase 12/27/2023 99  46 - 116 U/L Final    PT 12/27/2023 11.9  9.9 - 12.6 sec Final    INR 12/27/2023 1.04   Final    hsTroponin I 12/27/2023 8  <=34 ng/L Final    EKG Ventricular Rate 12/27/2023 75  BPM Final    EKG Atrial Rate 12/27/2023 75  BPM Final    EKG P-R Interval 12/27/2023 198  ms Final    EKG QRS Duration 12/27/2023 74  ms Final    EKG Q-T Interval 12/27/2023 398  ms Final    EKG QTC Calculation 12/27/2023 444  ms Final    EKG Calculated P Axis 12/27/2023 81  degrees Final    EKG Calculated R Axis 12/27/2023 -55  degrees Final    EKG Calculated T Axis 12/27/2023 70  degrees Final    QTC Fredericia 12/27/2023 428  ms Final    EKG Ventricular Rate 12/27/2023 60  BPM Final    EKG Atrial Rate 12/27/2023 60  BPM Final    EKG P-R Interval 12/27/2023 204  ms Final    EKG QRS Duration 12/27/2023 76  ms Final    EKG Q-T Interval 12/27/2023 382  ms Final    EKG QTC Calculation 12/27/2023 382  ms Final    EKG Calculated P Axis 12/27/2023 76  degrees Final    EKG Calculated R Axis 12/27/2023 -52  degrees Final    EKG Calculated T Axis 12/27/2023 79  degrees Final    QTC Fredericia 12/27/2023 382  ms Final    WBC 12/27/2023 7.7  3.6 - 11.2 10*9/L Final    RBC 12/27/2023 4.93  3.95 - 5.13 10*12/L Final    HGB 12/27/2023 12.2  11.3 - 14.9 g/dL Final    HCT 03/47/4259 36.3  34.0 - 44.0 % Final    MCV 12/27/2023 73.5 (L)  77.6 - 95.7 fL Final    MCH 12/27/2023 24.8 (L)  25.9 - 32.4 pg Final    MCHC 12/27/2023 33.7  32.0 - 36.0 g/dL Final    RDW 56/38/7564 16.2 (H)  12.2 - 15.2 % Final    MPV 12/27/2023 6.6 (L)  6.8 - 10.7 fL Final    Platelet 12/27/2023 456 (H)  150 - 450 10*9/L Final    nRBC 12/27/2023 0  <=4 /100 WBCs Final    Neutrophils % 12/27/2023 60.5  % Final    Lymphocytes % 12/27/2023 28.2  % Final Monocytes % 12/27/2023 8.7  % Final    Eosinophils % 12/27/2023 1.9  % Final    Basophils % 12/27/2023 0.7  % Final    Absolute Neutrophils 12/27/2023 4.6  1.8 - 7.8 10*9/L Final    Absolute Lymphocytes 12/27/2023 2.2  1.1 - 3.6 10*9/L Final    Absolute Monocytes 12/27/2023 0.7  0.3 - 0.8 10*9/L Final    Absolute Eosinophils 12/27/2023 0.1  0.0 - 0.5 10*9/L Final    Absolute Basophils 12/27/2023 0.1  0.0 - 0.1 10*9/L Final    Microcytosis 12/27/2023 Slight (A)  Not Present Final    hsTroponin I 12/27/2023 9  <=34 ng/L Final    delta hsTroponin I 12/27/2023 1  <=7 ng/L Final       STUDIES:  XR Chest 2 views  Result Date: 12/31/2023  EXAM: XR CHEST 2 VIEWS ACCESSION: [id number] UN REPORT DATE: 12/31/2023 9:42 AM CLINICAL INDICATION:  [place] ; COUGH  - R06.02 - SOB (shortness of breath)  TECHNIQUE: PA and Lateral Chest Radiographs. COMPARISON: Chest radiograph 12/20/2023 FINDINGS: Lungs are clear.  No pleural effusion or pneumothorax. Cardiac silhouette is normal in size. Thoracic aorta is tortuous with calcifications.     No evidence of pneumonia or pulmonary edema.    ECG 12 Lead  Rate: 67 bpm Rhythm: normal sinus P Axis: 84 R Axis: -47 T Axis: 81 PR: 194 ms QRS: 70 ms QT: 392 ms Qtc: 414 ms Overall impression: NSR Left anterior fascicular block Minimal voltage criteria for LVH Nonspecific T wave abnormality Comparison: 12/27/2023 No significant change         [facility]  [place] [facility] [place] [facility]  [address], [place], [place] [address]  Telephone [phone]  Fax [phone]  [email or web address]

## 2024-01-11 NOTE — Unmapped (Signed)
 Patient has procedure on Friday and wants to know when she needs to stop taking her plavix . She would like a call back at [phone]

## 2024-01-12 NOTE — Unmapped (Signed)
 Christean Courts, MD  Adelia Homestead, RN  The patient has a history of renal artery stenosis s/p stenting to the left renal artery (2021) and right renal artery (2023).    OK to hold Plavix  for 5 days prior to procedure.    No further cardiac testing is indicated prior to scheduled colonoscopy procedure.    Gabrielle Joiner, MD          Previous Messages       ----- Message -----  From: Adelia Homestead, RN  Sent: 01/12/2024  10:21 AM EDT  To: Christean Courts, MD  Subject: Plavix  Hold Order Needed                        Dear Dr. Tilda Fogo,    This patient has an order for a/an Colonoscopy. Could you please advise regarding Plavix  prior to procedure?  Thank you!     Patient: Kayla Knight    DOB: June 16, 1941    MRN: [id number]    Procedure: Colonoscopy    Antiplatelet: Plavix     Per ASGE Guidelines, GI Procedures recommend patients hold their Plavix  for five (5) days prior to procedure. Please indicate if this recommendation is acceptable for the patient.    Please advise if patient should continue their antiplatelet or if you disagree with these recommendations.    Pre-Operative Cardiac Risk Evaluation  Patient's risk is considered to be:     Low              Intermediate                  High       _____ No further cardiac testing is needed    _____ Patient is medically optimized for procedure    _____ This patient needs to be seen in the office for a cardiac evaluation    _____ Patient is not cleared for procedure due to: __________________________________       Thank you!    Sincerely,    [facility] GI Procedures Triage Nurse  P: [phone]  F: [phone]

## 2024-01-13 ENCOUNTER — Ambulatory Visit
Admit: 2024-01-13 | Discharge: 2024-01-14 | Payer: Medicare (Managed Care) | Attending: Internal Medicine | Primary: Internal Medicine

## 2024-01-13 DIAGNOSIS — A09 Infectious gastroenteritis and colitis, unspecified: Principal | ICD-10-CM

## 2024-01-13 DIAGNOSIS — R634 Abnormal weight loss: Principal | ICD-10-CM

## 2024-01-13 DIAGNOSIS — R911 Solitary pulmonary nodule: Principal | ICD-10-CM

## 2024-01-13 DIAGNOSIS — R1032 Left lower quadrant pain: Principal | ICD-10-CM

## 2024-01-13 DIAGNOSIS — K5730 Diverticulosis of large intestine without perforation or abscess without bleeding: Principal | ICD-10-CM

## 2024-01-13 DIAGNOSIS — K51519 Left sided colitis with unspecified complications: Principal | ICD-10-CM

## 2024-01-13 DIAGNOSIS — E441 Mild protein-calorie malnutrition: Principal | ICD-10-CM

## 2024-01-13 LAB — COMPREHENSIVE METABOLIC PANEL
ALBUMIN/GLOBULIN RATIO: 0.7 — ABNORMAL LOW (ref 1.1–2.2)
ALBUMIN: 3.4 g/dL — ABNORMAL LOW (ref 3.5–5.0)
ALKALINE PHOSPHATASE: 121 U/L (ref 50–136)
ALT (SGPT): 15 U/L (ref 12–78)
ANION GAP: 8 mmol/L
AST (SGOT): 18 U/L (ref 7–31)
BILIRUBIN TOTAL: 1.5 mg/dL (ref 0.0–1.5)
BLOOD UREA NITROGEN: 14 mg/dL (ref 5–26)
BUN / CREAT RATIO: 13 (ref 12–25)
CALCIUM: 9.7 mg/dL (ref 8.5–10.5)
CHLORIDE: 103 mmol/L (ref 95–110)
CO2: 26.9 mmol/L (ref 21.0–31.0)
CREATININE: 1.12 mg/dL (ref 0.50–1.50)
EGFR CKD-EPI (2021) FEMALE: 50 mL/min/1.73m2 — ABNORMAL LOW (ref >=60)
GLOBULIN, TOTAL: 4.7 g/dL — ABNORMAL HIGH (ref 1.5–4.3)
GLUCOSE RANDOM: 94 mg/dL (ref 60–100)
POTASSIUM: 4.8 mmol/L (ref 3.5–5.1)
PROTEIN TOTAL: 8.1 g/dL (ref 6.4–8.3)
SODIUM: 138 mmol/L (ref 136–145)

## 2024-01-13 LAB — CBC W/ AUTO DIFF
HEMATOCRIT: 37.5 % (ref 35.0–45.0)
HEMOGLOBIN: 11.6 g/dL (ref 11.3–15.5)
LYMPHOCYTES ABSOLUTE COUNT: 2.3 10*9/L (ref 1.0–4.8)
LYMPHOCYTES RELATIVE PERCENT: 31.1 %
MEAN CORPUSCULAR HEMOGLOBIN CONC: 31 g/dL (ref 31.0–36.0)
MEAN CORPUSCULAR HEMOGLOBIN: 24.2 pg — ABNORMAL LOW (ref 26.0–32.0)
MEAN CORPUSCULAR VOLUME: 78 fL — ABNORMAL LOW (ref 89.0–97.0)
MEAN PLATELET VOLUME: 6.3 fL — ABNORMAL LOW (ref 6.5–8.9)
MONOCYTES ABSOLUTE COUNT: 0.5 10*9/L (ref 0.0–0.8)
MONOCYTES RELATIVE PERCENT: 7.3 %
NEUTROPHILS ABSOLUTE COUNT: 4.9 10*9/L (ref 1.8–7.0)
NEUTROPHILS RELATIVE PERCENT: 61.6 %
PLATELET COUNT: 489 10*9/L — ABNORMAL HIGH (ref 140–440)
RED BLOOD CELL COUNT: 4.8 10*12/L (ref 3.80–5.10)
RED CELL DISTRIBUTION WIDTH: 15 % (ref 11.0–15.0)
WBC ADJUSTED: 7.7 10*9/L (ref 3.8–10.8)

## 2024-01-13 LAB — LIPASE: LIPASE: 34 U/L (ref 12–53)

## 2024-01-13 LAB — FERRITIN: FERRITIN: 99.8 ng/mL (ref 7.3–270.7)

## 2024-01-13 LAB — VITAMIN B12: VITAMIN B-12: 742 pg/mL (ref 211–911)

## 2024-01-13 LAB — TSH: THYROID STIMULATING HORMONE: 1.32 u[IU]/mL (ref 0.010–4.000)

## 2024-01-13 LAB — PREALBUMIN: PREALBUMIN: 17.3 mg/dL (ref 10.0–40.0)

## 2024-01-13 LAB — IRON & TIBC
IRON SATURATION: 13 % — ABNORMAL LOW (ref 20–55)
IRON: 36 ug/dL — ABNORMAL LOW (ref 50–170)
TOTAL IRON BINDING CAPACITY: 282 ug/dL (ref 250–425)

## 2024-01-13 LAB — T4, FREE: FREE T4: 1.45 ng/dL (ref 0.89–1.76)

## 2024-01-13 LAB — C-REACTIVE PROTEIN: C-REACTIVE PROTEIN: 42.9 mg/L — ABNORMAL HIGH (ref <=10.0)

## 2024-01-13 LAB — IGA: GAMMAGLOBULIN; IGA: 301.3 mg/dL (ref 70.0–400.0)

## 2024-01-13 LAB — SEDIMENTATION RATE: ERYTHROCYTE SEDIMENTATION RATE: 71 mm/h (ref 0–30)

## 2024-01-13 NOTE — Unmapped (Signed)
 Assessment and Plan  1. LLQ pain -uncertain etiology. Possibly diverticular but no evidence of frank diverticulitis from prior imaging or labs.  Repeat CT A/P.   Scheduled colonoscopy delayed due to need of hold of plavix    2. Diverticulosis of colon -discussed liquid diet might be helpful a few days w diverticulitis,not necessary or practical long term. Most do better w high fiber to prevent episodes of diverticulitis   3. Weight loss -as above. Encouraged to [employer] as noted.   Noted elevated inflammatory markers, uncertain etiology-check spep,may need further rheumatologic serologies   4. Mild protein-calorie malnutrition    5. Lesion of right lung -f/u CT w/o change.               Orders Placed This Encounter   Procedures    C. Difficile Assay    Calprotectin, Stool    Fecal Fat, Quantitative    GI Pathogen Panel ([place])    Pancreatic Elastase, Fecal    CT Abdomen Pelvis W Contrast    T4, free-nml    TSH-nml    Tissue transglutaminase, IgA    IgA    Alpha Gal IGE    Vitamin B12-nml    Iron Level and TIBC (Iron profile)-low    Ferritin    ESR-elevated 71    CRP-elevated 42.9    Comprehensive metabolic panel-albumin sl low    Lipase Level    CBC w/ Differential-low indices    Prealbumin (TTR)-low nml         Signs and symptoms that should prompt return sooner than scheduled were reviewed with the patient.  Visit time: In excess of  30  minutes, more than 50% of which was spent face-to-face in counseling and education activities regarding the diagnoses above.     HPI    Chief Complaint  Chief Complaint   Patient presents with    Abdominal Pain     Intermittent lower abdominal pain x months        Darrall Ellison seen few mos ago w abdominal pain. CT A/P w diverticulosis coli. Pt has had intermittent LLQ abdo pain. Has taken to change in diet,limited,no red meat,mostly soups,liquids,jello. Has lost 13 lbs. Has loose Bristol scale 7 stools 3-4 times per day. No fever,no bleeding.     Allergies:  Penicillins, Lisinopril , Statins-hmg-coa reductase inhibitors, and Sulfa (sulfonamide antibiotics)    Medications:   Outpatient Medications Prior to Visit   Medication Sig Dispense Refill    albuterol  HFA 90 mcg/actuation inhaler Inhale 2 puffs every six (6) hours as needed. 8 g 0    amlodipine  (NORVASC ) 10 MG tablet Take 1 tablet (10 mg total) by mouth daily. 90 tablet 3    clopidogrel  (PLAVIX ) 75 mg tablet TAKE 1 TABLET (75 MG TOTAL) BY MOUTH DAILY. 90 tablet 2    empty container (SHARPS-A-GATOR DISPOSAL SYSTEM) Misc Use as directed for sharps disposal 1 each 2    evolocumab  (REPATHA  SURECLICK) 140 mg/mL PnIj Inject the contents of 1 pen (140 mg) under the skin every fourteen (14) days. 6 mL 3    magnesium  oxide (MAG-OX) 400 mg (241.3 mg elemental magnesium ) tablet Take 1 tablet (400 mg total) by mouth daily.      MIEBO, PF, 100 % Drop Administer to both eyes two (2) times a day.      spironolactone  (ALDACTONE ) 50 MG tablet TAKE 1 TABLET (50 MG TOTAL) BY MOUTH DAILY. (Patient taking differently: Take 1 tablet (50 mg total) by mouth  daily. Taking 1/2 tablet ?) 90 tablet 2    timolol  (TIMOPTIC ) 0.25 % ophthalmic solution Administer 1 drop to both eyes daily. 5 mL 5    famotidine  (PEPCID ) 20 MG tablet Take 1 tablet (20 mg total) by mouth two (2) times a day. (Patient not taking: Reported on 01/13/2024) 180 tablet 3    mag hydrox/aluminum  hyd/simeth (ANTACID ANTI-GAS ORAL) Take by mouth. (Patient not taking: Reported on 01/13/2024)       No facility-administered medications prior to visit.       Medical History:  Past Medical History:   Diagnosis Date    Anemia     past history, over 40 yrs ago    Cataract 2008    Surgery    Diabetes mellitus     Type II, on 9/15 patient stated she was not longer diabetic, not taking meds    Dry eye     Hearing impairment     Heart murmur 2016    [facility] Jacquiline [facility]ul [facility]    Heart valve disease     Heart mumur, pt reports diagnosis 10 yrs ago (05/10/20)    History of stent insertion of renal artery 01/29/2020    ASA indefinitely and plavix  x 6 months per procedure note    Hypertension     pt reports taking meds since about 1995    Neuromuscular disorder 2014    Feet & Legs    Tear of meniscus of knee     4.5 yrs ago    Urinary incontinence     seen a doctor about nighttime bathroom use (adressed in 2019)       Surgical History:  Past Surgical History:   Procedure Laterality Date    CATARACT EXTRACTION W/ INTRAOCULAR LENS IMPLANT Left 02/27/2016    EYE SURGERY  01/30/16    Cataract surgery is scheduled.    JOINT REPLACEMENT  Knee    KNEE CARTILAGE SURGERY Right 2007, 2008    x 2    KNEE SURGERY      pt reports total left knee joint replacement surgery around 2016, 2017    PLANTAR FASCIECTOMY Left     PR REVASCULARIZE FEM/POP ARTERY,ANGIOPLASTY/STENT N/A 01/29/2020    Procedure: Peripheral Angiography W Intervetion;  Surgeon: Harvie Liner, MD;  Location: [facility] CATH;  Service: Cardiology    PR REVASCULARIZE FEM/POP ARTERY,ANGIOPLASTY/STENT Right 12/26/2021    Procedure: right renal artery intervention;  Surgeon: Harvie Liner, MD;  Location: [facility] CATH;  Service: Cardiology    PR UPPER GI ENDOSCOPY,BIOPSY N/A 04/01/2021    Procedure: UGI ENDOSCOPY; WITH BIOPSY, SINGLE OR MULTIPLE;  Surgeon: Earl Glassing, MD;  Location: [facility] PROCEDURES [facility];  Service: Gastroenterology    PR XCAPSL CTRC RMVL INSJ IO LENS PROSTH W/O ECP Right 02/13/2016    Procedure: EXTRACAPSULAR CATARACT REMOVAL W/INSERTION OF INTRAOCULAR LENS PROSTHESIS, MANUAL OR MECHANICAL TECHNIQUE;  Surgeon: Betha Brookes, MD;  Location: [facility] OR [facility];  Service: Ophthalmology    PR XCAPSL CTRC RMVL INSJ IO LENS PROSTH W/O ECP Left 02/27/2016    Procedure: EXTRACAPSULAR CATARACT REMOVAL W/INSERTION OF INTRAOCULAR LENS PROSTHESIS, MANUAL OR MECHANICAL TECHNIQUE;  Surgeon: Betha Brookes, MD;  [facility] OR [facility];  Service: Ophthalmology    RENAL ARTERY STENT  01/11/2020    ROOT CANAL      TUBAL LIGATION  1973    [facility]    WISDOM TOOTH EXTRACTION         Social  History:  Tobacco use:   reports that she quit smoking about 39 years ago. Her smoking use included cigarettes. She has been exposed to tobacco smoke. She has never used smokeless tobacco.  Alcohol use:   reports current alcohol use.  Drug use:  reports no history of drug use.          Review of Systems:  ROS:   General ROS: negative for - fever,+weight loss  HEENT ROS: negative for sore throat, or rhinitis  Respiratory ROS: no cough, shortness of breath, or wheezing  Cardiovascular ROS: no chest pain or dyspnea on exertion  Gastrointestinal ROS: no black or bloody stools  Genito-Urinary ROS: UTI 3/25 ,treated w fosfomycin  Musculoskeletal ROS: negative for - new joint pain, muscle pain or muscular weakness   Dermatological ROS: negative for rash or new lesions  Neuro:no headaches      Vitals:    01/13/24 0802   BP: 130/72   BP Site: L Arm   BP Position: Sitting   BP Cuff Size: Small   Pulse: 62   Temp: 36.6 ??C (97.9 ??F)   TempSrc: Oral   SpO2: 98%   Weight: 66.7 kg (147 lb)       Physical Exam  General: alert, oriented, no distress  EYES: Anicteric sclerae.  NECK: no lymphadenopathy,   RESP: Relaxed respiratory effort. Clear to auscultation without wheezes or crackles.    CV: Regular rate and rhythm. Normal S1 and S2. +murumur  ABDO:soft,nontender,no hsm,no guarding or rebound,no distension  EXT:  No peripheral edema

## 2024-01-17 ENCOUNTER — Ambulatory Visit: Admit: 2024-01-17 | Discharge: 2024-01-18 | Payer: MEDICARE

## 2024-01-17 DIAGNOSIS — R1032 Left lower quadrant pain: Principal | ICD-10-CM

## 2024-01-17 DIAGNOSIS — K51519 Left sided colitis with unspecified complications: Principal | ICD-10-CM

## 2024-01-17 LAB — ALPHA GAL IGE: ALPHA-GAL IGE: <0.1 kU/L (ref <0.1)

## 2024-01-18 NOTE — Unmapped (Signed)
 Addended by: Cortland Ding on: 01/18/2024 12:01 PM     Modules accepted: Orders

## 2024-01-19 LAB — TISSUE TRANSGLUTAMINASE (TTG), IGA
TISSUE TRANSGLUTAMINASE ANTIBODY, IGA: 0.4 U/mL (ref <7)
TTG INTERPRETATION: NEGATIVE

## 2024-01-20 LAB — SERUM PROTEIN ELECTROPHORESIS AND IMMUNOFIXATION
ALBUMIN (SPE): 3.6 g/dL (ref 3.5–5.0)
ALPHA-1 GLOBULIN: 0.5 g/dL (ref 0.2–0.5)
ALPHA-2 GLOBULIN: 1.2 g/dL — ABNORMAL HIGH (ref 0.5–1.1)
BETA-1 GLOBULIN: 0.5 g/dL (ref 0.3–0.6)
BETA-2 GLOBULIN: 0.5 g/dL (ref 0.2–0.6)
GAMMAGLOBULIN: 1.3 g/dL (ref 0.5–1.5)
PROTEIN TOTAL (SPECIAL CHEM): 7.5 g/dL

## 2024-01-21 ENCOUNTER — Emergency Department
Admit: 2024-01-21 | Discharge: 2024-01-21 | Disposition: A | Discharge status: Home / Self Care | Payer: Medicare (Managed Care)

## 2024-01-21 DIAGNOSIS — M899 Disorder of bone, unspecified: Principal | ICD-10-CM

## 2024-01-21 DIAGNOSIS — M898X8 Other specified disorders of bone, other site: Principal | ICD-10-CM

## 2024-01-21 LAB — COMPREHENSIVE METABOLIC PANEL
ALBUMIN: 3.4 g/dL (ref 3.4–5.0)
ALKALINE PHOSPHATASE: 126 U/L — ABNORMAL HIGH (ref 46–116)
ALT (SGPT): 13 U/L (ref 10–49)
ANION GAP: 13 mmol/L (ref 5–14)
AST (SGOT): 23 U/L (ref <=34)
BILIRUBIN TOTAL: 0.4 mg/dL (ref 0.3–1.2)
BLOOD UREA NITROGEN: 21 mg/dL (ref 9–23)
BUN / CREAT RATIO: 20
CALCIUM: 10.1 mg/dL (ref 8.7–10.4)
CHLORIDE: 100 mmol/L (ref 98–107)
CO2: 23.6 mmol/L (ref 20.0–31.0)
CREATININE: 1.04 mg/dL — ABNORMAL HIGH (ref 0.55–1.02)
EGFR CKD-EPI (2021) FEMALE: 54 mL/min/1.73m2 — ABNORMAL LOW (ref >=60)
GLUCOSE RANDOM: 101 mg/dL (ref 70–179)
POTASSIUM: 4.4 mmol/L (ref 3.4–4.8)
PROTEIN TOTAL: 8 g/dL (ref 5.7–8.2)
SODIUM: 137 mmol/L (ref 135–145)

## 2024-01-21 LAB — URINALYSIS WITH MICROSCOPY WITH CULTURE REFLEX PERFORMABLE
BILIRUBIN UA: NEGATIVE
GLUCOSE UA: NEGATIVE
HYALINE CASTS: 4 /LPF — ABNORMAL HIGH (ref 0–1)
NITRITE UA: NEGATIVE
PH UA: 5.5 (ref 5.0–9.0)
RBC UA: 2 /HPF (ref <=4)
SPECIFIC GRAVITY UA: 1.027 (ref 1.003–1.030)
SQUAMOUS EPITHELIAL: <1 /HPF (ref 0–5)
UROBILINOGEN UA: 2 — AB
WBC UA: 1 /HPF (ref 0–5)

## 2024-01-21 LAB — CBC W/ AUTO DIFF
BASOPHILS ABSOLUTE COUNT: 0.1 10*9/L (ref 0.0–0.1)
BASOPHILS RELATIVE PERCENT: 0.9 %
EOSINOPHILS ABSOLUTE COUNT: 0.1 10*9/L (ref 0.0–0.5)
EOSINOPHILS RELATIVE PERCENT: 1.2 %
HEMATOCRIT: 33.5 % — ABNORMAL LOW (ref 34.0–44.0)
HEMOGLOBIN: 11.3 g/dL (ref 11.3–14.9)
LYMPHOCYTES ABSOLUTE COUNT: 2.1 10*9/L (ref 1.1–3.6)
LYMPHOCYTES RELATIVE PERCENT: 26.9 %
MEAN CORPUSCULAR HEMOGLOBIN CONC: 33.7 g/dL (ref 32.0–36.0)
MEAN CORPUSCULAR HEMOGLOBIN: 24.2 pg — ABNORMAL LOW (ref 25.9–32.4)
MEAN CORPUSCULAR VOLUME: 71.9 fL — ABNORMAL LOW (ref 77.6–95.7)
MEAN PLATELET VOLUME: 6.3 fL — ABNORMAL LOW (ref 6.8–10.7)
MONOCYTES ABSOLUTE COUNT: 0.7 10*9/L (ref 0.3–0.8)
MONOCYTES RELATIVE PERCENT: 9.2 %
NEUTROPHILS ABSOLUTE COUNT: 4.7 10*9/L (ref 1.8–7.8)
NEUTROPHILS RELATIVE PERCENT: 61.8 %
NUCLEATED RED BLOOD CELLS: 0 /100{WBCs} (ref <=4)
PLATELET COUNT: 558 10*9/L — ABNORMAL HIGH (ref 150–450)
RED BLOOD CELL COUNT: 4.66 10*12/L (ref 3.95–5.13)
RED CELL DISTRIBUTION WIDTH: 15.9 % — ABNORMAL HIGH (ref 12.2–15.2)
WBC ADJUSTED: 7.6 10*9/L (ref 3.6–11.2)

## 2024-01-21 LAB — SEDIMENTATION RATE: ERYTHROCYTE SEDIMENTATION RATE: 91 mm/h — ABNORMAL HIGH (ref 0–30)

## 2024-01-21 LAB — LIPASE: LIPASE: 42 U/L (ref 12–53)

## 2024-01-21 LAB — C-REACTIVE PROTEIN: C-REACTIVE PROTEIN: 58.7 mg/L — ABNORMAL HIGH (ref <=10.0)

## 2024-01-21 MED ADMIN — iohexol (OMNIPAQUE) 350 mg iodine/mL solution 100 mL: 100 mL | INTRAVENOUS | @ 18:00:00 | Stop: 2024-01-21

## 2024-01-21 NOTE — Unmapped (Signed)
 [facility]  Emergency Department Provider Note      ED Clinical Impression      Final diagnoses:   Lesion of bone of thoracic spine   Lesion of bone of lumbosacral spine   Bony pelvic pain (Primary)            Impression, Medical Decision Making, Progress Notes and Critical Care      Impression, Differential Diagnosis and Plan of Care    Kayla Knight is an 83 y.o. woman with a past medical history as described below presenting with 2-3 months of lower abdominal, with an acute worsening in the last few days, and general malaise.    Will order for UA, CRP, Sedimentation rate, CMP, lipase, CBC, EKG, and CT abdomen pelvis.     The patient is non-toxic appearing and in NAD.  Vital signs are unremarkable.  Upon exam, the patient is well appearing.  Midline lumbar tender to palpation. No thoracic or cervical TTP. Abdomen is non-tender and soft. Non-tender to the bony hip projections    Differential includes multiple myeloma versus bony malignancy versus metastatic malignancy.    ED Course as of 01/22/24 1722   Thu Jan 20, 2024   2100 Patient had urgent referral to the undiagnosed cancer clinic and advised over-the-counter Tylenol and ibuprofen for pain management this time.  She was given information sheet about multiple myeloma.  There is higher suspicion for this over metastatic malignancy as there is no other known cancer or suggestive symptoms of other forms of cancer.     Portions of this record have been created using Scientist, clinical (histocompatibility and immunogenetics). Dictation errors have been sought, but may not have been identified and corrected.    See chart and resident provider documentation for details.    ____________________________________________         History        Reason for Visit  Abdominal Pain        HPI   Kayla Knight is a 83 y.o. 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 The patient reports 2-3 months of gradually worsening abdominal pain. Endorses general fatigue and pain upon standing from a seated or laying position. She hs not attempted to manage pain with any medication. She notes that she has done a liquid diet to treat her diverticulitis and states that when she transitioned back to solid food she experienced a flare up of abdominal pain. Denies hematochezia.       External Records Reviewed  (Inpatient/Outpatient notes, Prior labs/imaging studies, Care Everywhere, PDMP, External ED notes, etc)    [facility] 01/13/2024 for past medical history    Past Medical History:   Diagnosis Date    Anemia     past history, over 40 yrs ago    Cataract 2008    Surgery    Diabetes mellitus     Type II, on 9/15 patient stated she was not longer diabetic, not taking meds    Dry eye     Hearing impairment     Heart murmur 2016    Dr. Jacquiline Maul @ [facility]    Heart valve disease     Heart mumur, pt reports diagnosis 10 yrs ago (05/10/20)    History of stent insertion of renal artery 01/29/2020    ASA indefinitely and plavix x  6 months per procedure note    Hypertension     pt reports taking meds since about 1995    Neuromuscular disorder 2014    Feet & Legs    Tear of meniscus of knee     4.5 yrs ago    Urinary incontinence     seen a doctor about nighttime bathroom use (adressed in 2019)       Patient Active Problem List   Diagnosis    Benign essential hypertension    Primary osteoarthritis of right knee    Idiopathic peripheral neuropathy    MR (mitral regurgitation)    Type 2 diabetes mellitus without complication, without long-term current use of insulin    Neck pain    Encounter for subsequent annual wellness visit (AWV) in Medicare patient    Lipoma of torso    Skin lesion    Hearing loss    Lichen sclerosus et atrophicus of the vulva    Renal artery stenosis, native, bilateral (CMS-HCC)    Hyperlipidemia, unspecified    Aftercare following joint replacement    Presence of left artificial knee joint    Hypokalemia    History of chest pain    Decreased GFR    Nonrheumatic aortic valve insufficiency    Endometrial thickening on ultrasound    Weakness of both hips    Right hip pain    Hot flashes    Aortic valve disease    Post-nasal drip    Witnessed episode of apnea    Glaucoma suspect of both eyes    Dry eye syndrome, bilateral    PVD (posterior vitreous detachment), bilateral    S/P cataract extraction and insertion of intraocular lens, left    S/P cataract extraction and insertion of intraocular lens, right    Meibomian gland dysfunction (MGD), bilateral, both upper and lower lids    Left flank pain       Past Surgical History:   Procedure Laterality Date    CATARACT EXTRACTION W/ INTRAOCULAR LENS IMPLANT Left 02/27/2016    EYE SURGERY  01/30/16    Cataract surgery is scheduled.    JOINT REPLACEMENT  Knee    KNEE CARTILAGE SURGERY Right 2007, 2008    x 2    KNEE SURGERY      pt reports total left knee joint replacement surgery around 2016, 2017    PLANTAR FASCIECTOMY Left     PR REVASCULARIZE FEM/POP ARTERY,ANGIOPLASTY/STENT N/A 01/29/2020    Procedure: Peripheral Angiography W Intervetion;  Surgeon: Harvie Liner, MD;  Location: [facility] CATH;  Service: Cardiology    PR REVASCULARIZE FEM/POP ARTERY,ANGIOPLASTY/STENT Right 12/26/2021    Procedure: right renal artery intervention;  Surgeon: Harvie Liner, MD;  Location: [facility] CATH;  Service: Cardiology    PR UPPER GI ENDOSCOPY,BIOPSY N/A 04/01/2021    Procedure: UGI ENDOSCOPY; WITH BIOPSY, SINGLE OR MULTIPLE;  Surgeon: Earl Glassing, MD;  Location: [facility];  Service: Gastroenterology    PR XCAPSL CTRC RMVL INSJ IO LENS PROSTH W/O ECP Right 02/13/2016    Procedure: EXTRACAPSULAR CATARACT REMOVAL W/INSERTION OF INTRAOCULAR LENS PROSTHESIS, MANUAL OR MECHANICAL TECHNIQUE;  Surgeon: Betha Brookes, MD;  Location: [facility] OR [facility]; Service: Ophthalmology    PR XCAPSL CTRC RMVL INSJ IO LENS PROSTH W/O ECP Left 02/27/2016    Procedure: EXTRACAPSULAR CATARACT REMOVAL W/INSERTION OF INTRAOCULAR LENS PROSTHESIS, MANUAL OR MECHANICAL TECHNIQUE;  Surgeon: Betha Brookes, MD;  [facility]: [facility] OR [facility];  Service: Ophthalmology  RENAL ARTERY STENT  01/11/2020    ROOT CANAL      TUBAL LIGATION  1973    [facility]    WISDOM TOOTH EXTRACTION         No current facility-administered medications for this encounter.    Current Outpatient Medications:     albuterol HFA 90 mcg/actuation inhaler, Inhale 2 puffs every six (6) hours as needed., Disp: 8 g, Rfl: 0    amlodipine (NORVASC) 10 MG tablet, Take 1 tablet (10 mg total) by mouth daily., Disp: 90 tablet, Rfl: 3    clopidogrel (PLAVIX) 75 mg tablet, TAKE 1 TABLET (75 MG TOTAL) BY MOUTH DAILY., Disp: 90 tablet, Rfl: 2    empty container (SHARPS-A-GATOR DISPOSAL SYSTEM) Misc, Use as directed for sharps disposal, Disp: 1 each, Rfl: 2    evolocumab (REPATHA SURECLICK) 140 mg/mL PnIj, Inject the contents of 1 pen (140 mg) under the skin every fourteen (14) days., Disp: 6 mL, Rfl: 3    famotidine (PEPCID) 20 MG tablet, Take 1 tablet (20 mg total) by mouth two (2) times a day. (Patient not taking: Reported on 01/13/2024), Disp: 180 tablet, Rfl: 3    mag hydrox/aluminum hyd/simeth (ANTACID ANTI-GAS ORAL), Take by mouth. (Patient not taking: Reported on 01/13/2024), Disp: , Rfl:     magnesium oxide (MAG-OX) 400 mg (241.3 mg elemental magnesium) tablet, Take 1 tablet (400 mg total) by mouth daily., Disp: , Rfl:     MIEBO, PF, 100 % Drop, Administer to both eyes two (2) times a day., Disp: , Rfl:     spironolactone (ALDACTONE) 50 MG tablet, TAKE 1 TABLET (50 MG TOTAL) BY MOUTH DAILY. (Patient taking differently: Take 1 tablet (50 mg total) by mouth daily. Taking 1/2 tablet ?), Disp: 90 tablet, Rfl: 2    timolol (TIMOPTIC) 0.25 % ophthalmic solution, Administer 1 drop to both eyes daily., Disp: 5 mL, Rfl: 5    Allergies  Penicillins, Grass pollen-june grass standard, Lisinopril, Statins-hmg-coa reductase inhibitors, and Sulfa (sulfonamide antibiotics)    Family History   Problem Relation Age of Onset    Heart disease Mother     Hypertension Mother     Dementia Mother     Arthritis Mother     Heart disease Father     Thyroid disease Father     Glaucoma Father     Alcohol abuse Father     Heart attack Father 40    Arthritis Father     Hypertension Father         Massive heart attack    Arthritis Sister     Glaucoma Sister     Hypertension Sister     Diabetes Sister     Diabetes Daughter     Diabetes Paternal Aunt     No Known Problems Brother     No Known Problems Maternal Aunt     No Known Problems Maternal Uncle     No Known Problems Paternal Uncle     No Known Problems Maternal Grandmother     No Known Problems Maternal Grandfather     No Known Problems Paternal Grandmother     No Known Problems Paternal Grandfather     Arthritis Sister         Noticeable in extremities    COPD Sister     Depression Sister     Hypertension Sister     Glaucoma Sister     Asthma Daughter         Kayla Knight  Hypertension Daughter     Miscarriages / [place] Daughter     Cancer Maternal Aunt         Died more than 59yrs ago    Early death Maternal Aunt     Diabetes Paternal Aunt         4 Aunts    Kidney disease Paternal Aunt     Amblyopia Neg Hx     Blindness Neg Hx     Cancer Neg Hx     Cataracts Neg Hx     Macular degeneration Neg Hx     Retinal detachment Neg Hx     Strabismus Neg Hx     Stroke Neg Hx        Social History  Social History     Tobacco Use    Smoking status: Former     Current packs/day: 0.00     Types: Cigarettes     Quit date: 05/19/1984     Years since quitting: 39.7     Passive exposure: Past    Smokeless tobacco: Never   Vaping Use    Vaping status: Never Used   Substance Use Topics    Alcohol use: Yes     Comment: occassional    Drug use: Never          Physical Exam     ED Triage Vitals [01/21/24 1305] Enc Vitals Group      BP 150/50      Pulse 73      SpO2 Pulse       Resp 16      Temp 36.6 ??C (97.9 ??F)      Temp Source Oral      SpO2 98 %      Weight       Height       Head Circumference       Peak Flow       Pain Score       Pain Loc       Pain Education       Exclude from Growth Chart        Constitutional: Alert and oriented. Well appearing and in no distress.  Eyes: Conjunctivae are normal.  Cardiovascular: Normal rate, regular rhythm.   Respiratory: Normal respiratory effort.   Gastrointestinal: Soft and nontender. There is no CVA tenderness.  Musculoskeletal: Normal range of motion in all extremities. Midline lumbar tender to palpation. No thoracic or cervical TTP.  Non-tender to the bony hip projections  Neurologic: Normal speech and language. No gross focal neurologic deficits are appreciated.  Skin: Skin is warm, dry and intact. No rash noted.  Psychiatric: Mood and affect are normal. Speech and behavior are normal.       Radiology     CT Abdomen Pelvis W IV Contrast   Final Result      -Multiple new lytic lesions throughout the axial and appendicular skeleton, concerning for neoplastic process such as multiple myeloma or metastases.      -New central endplate invagination at the inferior endplate of L4, possibly degenerative versus pathologic.      -Otherwise, no evidence for acute diverticulitis as clinically questioned. No other acute intra-abdominal or pelvic pathology.              Documentation assistance was provided by Owen Blowers on January 21, 2024 at 5:04 PM for Mackey Sayers, PA.    January 22, 2024 5:23 PM. Documentation assistance provided by the scribe. I was present  during the time the encounter was recorded. The information recorded by the scribe was done at my direction and has been reviewed and validated by me.          Emogene Harpin, PA  01/22/24 1723

## 2024-01-21 NOTE — Unmapped (Signed)
 Received call from patient who is currently under the care of Dr. Shella Devoid for ongoing abdominal pain.  He has ordered an abdominal CT scan which has yet to be completed due to denial of coverage by her insurer.  Additionally, he ordered a colonoscopy which has yet to be completed because the GI procedures team is awaiting instructions pertaining to her regular need for Plavix.  Finally, there are two stool test results pending (pancreatic elastase and calprotectin).    Patient reported her LLQ abdominal pain is worse and severe.  She is unable to move without severe pain, almost urinated due to putting off movement because of the pain of rolling out of bed to get to the restroom, and has been unable to use her CPAP machine because of the pain (movement required for adjustment of equipment and perhaps a component of overstimulation).  She denied radiation of pain, black and/or tarry stools, stool that looks like coffee grounds, and bright red blood upon defecation.  Endorsed oily stools.  Reported less frequent bowel movements that are a little more formed but still quite soft upon cessation of a previous all-liquid diet.  Denied dizziness/light-headedness, feeling faint, nausea, and vomiting.  Given severity of pain and age, recommended evaluation in the Emergency Department.  Patient stated she feels comfortable with driving herself (she lives alone and does not wish for her daughter to drive her as she, herself, lives with chronic pain).  Instructed her to pull over and call 911 immediately should she experience a sudden increase in pain or any wooziness en route.  Patient verbalized understanding, amenability, and appreciation.

## 2024-01-21 NOTE — Unmapped (Signed)
 Pt reports lower abd pain for the past few months but has gotten worse.

## 2024-01-24 NOTE — Unmapped (Signed)
 [facility]_Oncology_Oper New Intake Call     Brenlynn, Bin  1941-08-24  [phone]    We just received a call regarding this patient. They are requesting a new appointment for a Sarcoma/Osteosarcoma/Somatic Solid Tumors bone lesion diagnosis. Please follow up with the patient for scheduling.     Referral?: Yes    Thank you,   Gaynelle Keeling  [facility]  [phone]

## 2024-01-24 NOTE — Unmapped (Signed)
 Multidisciplinary Oncology Program Intake Form    Referral Receive Date:  01/21/24    Rapid Access Appointment : No -  Patient not in scope for Rapid Access    Reason for Referral:   Initial Consultation: No treatment started and no confirmed diagnosis   Disease Group: UCC  Specialty:  Surgery    Referral Method:  [facility]  Primary Insurance: Medicare    Authorization Obtained: Yes      Record Collection:     RECORDS -  INTERNAL REFERRAL    Screening Questions:     If <43, is the patient interested in learning about [facility] Preservation? No    Close the Loop Communication:  Referring Provider Contacted: No  Patient Contacted: Yes    Welcome Packet   Date Sent: 01/24/24  Sent: Email

## 2024-02-01 ENCOUNTER — Ambulatory Visit: Admit: 2024-02-01 | Discharge: 2024-02-02 | Payer: Medicare (Managed Care)

## 2024-02-01 DIAGNOSIS — R1904 Left lower quadrant abdominal swelling, mass and lump: Principal | ICD-10-CM

## 2024-02-01 DIAGNOSIS — M898X9 Other specified disorders of bone, unspecified site: Principal | ICD-10-CM

## 2024-02-01 DIAGNOSIS — M898X8 Other specified disorders of bone, other site: Principal | ICD-10-CM

## 2024-02-01 DIAGNOSIS — R102 Pelvic and perineal pain: Principal | ICD-10-CM

## 2024-02-01 DIAGNOSIS — M899 Disorder of bone, unspecified: Principal | ICD-10-CM

## 2024-02-01 DIAGNOSIS — R918 Other nonspecific abnormal finding of lung field: Principal | ICD-10-CM

## 2024-02-01 LAB — CA 125: CA 125: 448 U/mL — ABNORMAL HIGH (ref 0–35)

## 2024-02-01 LAB — LACTATE DEHYDROGENASE: LACTATE DEHYDROGENASE: 265 U/L — ABNORMAL HIGH (ref 120–246)

## 2024-02-02 LAB — BETA 2 MICROGLOBULIN, SERUM: BETA 2 MICROGLOBULIN: 3.54 ug/mL — ABNORMAL HIGH (ref 0.87–2.34)

## 2024-02-02 LAB — SERUM FREE LIGHT CHAINS
K/L FLC RATIO: 1.62 (ref 0.26–1.65)
KAPPA FREE,SERUM: 4.05 mg/dL — ABNORMAL HIGH (ref 0.33–1.94)
LAMBDA FREE, SER: 2.5 mg/dL (ref 0.57–2.63)

## 2024-02-03 MED ORDER — ALBUTEROL SULFATE HFA 90 MCG/ACTUATION AEROSOL INHALER
Freq: Four times a day (QID) | RESPIRATORY_TRACT | 0 refills | 0.00000 days | PRN
Start: 2024-02-03 — End: ?

## 2024-02-07 ENCOUNTER — Ambulatory Visit: Admit: 2024-02-07 | Discharge: 2024-02-08 | Payer: Medicare (Managed Care)

## 2024-02-07 DIAGNOSIS — M899 Disorder of bone, unspecified: Principal | ICD-10-CM

## 2024-02-07 DIAGNOSIS — R109 Unspecified abdominal pain: Principal | ICD-10-CM

## 2024-02-07 LAB — COMPREHENSIVE METABOLIC PANEL
ALBUMIN/GLOBULIN RATIO: 0.6 — ABNORMAL LOW (ref 1.1–2.2)
ALBUMIN: 3.1 g/dL — ABNORMAL LOW (ref 3.5–5.0)
ALKALINE PHOSPHATASE: 181 U/L — ABNORMAL HIGH (ref 50–136)
ALT (SGPT): 43 U/L (ref 12–78)
ANION GAP: 10 mmol/L
AST (SGOT): 35 U/L — ABNORMAL HIGH (ref 7–31)
BILIRUBIN TOTAL: 0.3 mg/dL (ref 0.0–1.5)
BLOOD UREA NITROGEN: 11 mg/dL (ref 5–26)
BUN / CREAT RATIO: 13 (ref 12–25)
CALCIUM: 9.4 mg/dL (ref 8.5–10.5)
CHLORIDE: 101 mmol/L (ref 95–110)
CO2: 27.4 mmol/L (ref 21.0–31.0)
CREATININE: 0.88 mg/dL (ref 0.50–1.50)
EGFR CKD-EPI (2021) FEMALE: 66 mL/min/1.73m2 (ref >=60)
GLOBULIN, TOTAL: 4.9 g/dL — ABNORMAL HIGH (ref 1.5–4.3)
GLUCOSE RANDOM: 115 mg/dL — ABNORMAL HIGH (ref 60–100)
POTASSIUM: 4.8 mmol/L (ref 3.5–5.1)
PROTEIN TOTAL: 8 g/dL (ref 6.4–8.3)
SODIUM: 138 mmol/L (ref 136–145)

## 2024-02-07 MED ORDER — OXYCODONE 5 MG TABLET
ORAL_TABLET | Freq: Four times a day (QID) | ORAL | 0 refills | 10.00000 days | Status: CP | PRN
Start: 2024-02-07 — End: ?

## 2024-02-07 NOTE — Unmapped (Signed)
 Assessment and Plan  1. Abdominal pain, unspecified abdominal location    2. Bone lesion      Abdominal pain/Bone lesions  Severe pain from knees to upper rib. CT abd/pelvis 4/25 with multiple new bony lesions, no diverticulitis. Exam today unrevealing.   --Scheduled for PET CT Thursday. Believe pain may be referred from bony lesions. Check CMP today. 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 Discussed heavily restricting alcohol consumption on this medication, avoid driving.      Requested Prescriptions     Signed Prescriptions Disp Refills    oxyCODONE (ROXICODONE) 5 MG immediate release tablet 40 tablet 0     Sig: Take 1 tablet (5 mg total) by mouth every six (6) hours as needed for pain.     There are no discontinued medications.    Return in about 1 week (around 02/14/2024) for with dr. powell-tillman - pain - video OK.  Signs and symptoms that should prompt return sooner than scheduled were reviewed with the patient.        HPI    Chief Complaint  Chief Complaint   Patient presents with    Pain     Pt c/o pain starting at knees up to ribcage. Has PET scan scheduled for Thursday. States she is not taking any of her medications until after procedure.      Kayla Knight is an 83 year old patient of Dr. Powell-Tillman here for evaluation of pain.    Reports severe pain from knees up to ribcage, ongoing for multiple months however worsened acutely overnight. Unable to sleep last night and has difficulty getting out of bed due to pain.  Feels like muscle pain in the legs, generalized abdominal pain, and specifically upper rib pain. No improvement with Tylenol/ibuprofen. Normal bowel movements since stopping Miralax.   Of note, was seen in ED 4/25 at which time had CT abdomen/pelvis which demonstrated new bony lesions concerning for malignant process. Scheduled for PET CT 5/15. Feels her current pain is different than the abdominal pain which prompted the ED visit. States she saw oncology last week.    Allergies:  Penicillins, Grass pollen-june grass standard, Lisinopril, Statins-hmg-coa reductase inhibitors, and Sulfa (sulfonamide antibiotics)    Medications:   Outpatient Medications Prior to Visit   Medication Sig Dispense Refill    albuterol HFA 90 mcg/actuation inhaler Inhale 2 puffs every six (6) hours as needed. 8 g 0    amlodipine (NORVASC) 10 MG tablet Take 1 tablet (10 mg total) by mouth daily. 90 tablet 3    clopidogrel (PLAVIX) 75 mg tablet TAKE 1 TABLET (75 MG TOTAL) BY MOUTH DAILY. 90 tablet 2    empty container (SHARPS-A-GATOR DISPOSAL SYSTEM) Misc Use as directed for sharps disposal 1 each 2    evolocumab (REPATHA SURECLICK) 140 mg/mL PnIj Inject the contents of 1 pen (140 mg) under the skin every fourteen (14) days. 6 mL 3    famotidine (PEPCID) 20 MG tablet Take 1 tablet (20 mg total) by mouth two (2) times a day. 180 tablet 3    mag hydrox/aluminum hyd/simeth (ANTACID ANTI-GAS ORAL) Take by mouth.      magnesium oxide (MAG-OX) 400 mg (241.3 mg elemental magnesium) tablet Take 1 tablet (400 mg total) by mouth daily.      MIEBO, PF, 100 % Drop Administer to both eyes two (2) times a day.      spironolactone (ALDACTONE) 50 MG tablet TAKE 1 TABLET (50 MG TOTAL) BY MOUTH  DAILY. (Patient taking differently: Take 1 tablet (50 mg total) by mouth daily. Taking 1/2 tablet ?) 90 tablet 2    timolol (TIMOPTIC) 0.25 % ophthalmic solution Administer 1 drop to both eyes daily. 5 mL 5     No facility-administered medications prior to visit.       Medical History:  Past Medical History:   Diagnosis Date    Anemia     past history, over 40 yrs ago    Cataract 2008    Surgery    Diabetes mellitus       Type II, on 9/15 patient stated she was not longer diabetic, not taking meds    Dry eye     Hearing impairment     Heart murmur 2016    [facility]quiline [facility]ul [facility]    Heart valve disease     Heart mumur, pt reports diagnosis 10 yrs ago (05/10/20)    History of stent insertion of renal artery 01/29/2020    ASA indefinitely and plavix x 6 months per procedure note    Hypertension     pt reports taking meds since about 1995    Neuromuscular disorder   2014    Feet & Legs    Tear of meniscus of knee     4.5 yrs ago    Urinary incontinence     seen a doctor about nighttime bathroom use (adressed in 2019)       Surgical History:  Past Surgical History:   Procedure Laterality Date    CATARACT EXTRACTION W/ INTRAOCULAR LENS IMPLANT Left 02/27/2016    EYE SURGERY  01/30/16    Cataract surgery is scheduled.    JOINT REPLACEMENT  Knee    KNEE CARTILAGE SURGERY Right 2007, 2008    x 2    KNEE SURGERY      pt reports total left knee joint replacement surgery around 2016, 2017    PLANTAR FASCIECTOMY Left     PR REVASCULARIZE FEM/POP ARTERY,ANGIOPLASTY/STENT N/A 01/29/2020    Procedure: Peripheral Angiography W Intervetion;  Surgeon: Harvie Liner, MD;  Location: [facility] CATH;  Service: Cardiology    PR REVASCULARIZE FEM/POP ARTERY,ANGIOPLASTY/STENT Right 12/26/2021    Procedure: right renal artery intervention;  Surgeon: Harvie Liner, MD;  Location: [facility] CATH;  Service: Cardiology    PR UPPER GI ENDOSCOPY,BIOPSY N/A 04/01/2021    Procedure: UGI ENDOSCOPY; WITH BIOPSY, SINGLE OR MULTIPLE;  Surgeon: Earl Glassing, MD;  Location: [facility];  Service: Gastroenterology    PR XCAPSL CTRC RMVL INSJ IO LENS PROSTH W/O ECP Right 02/13/2016    Procedure: EXTRACAPSULAR CATARACT REMOVAL W/INSERTION OF INTRAOCULAR LENS PROSTHESIS, MANUAL OR MECHANICAL TECHNIQUE;  Surgeon: Betha Brookes, MD;  Location: [facility] OR [facility];  Service: Ophthalmology    PR XCAPSL CTRC RMVL INSJ IO LENS PROSTH W/O ECP Left 02/27/2016    Procedure: EXTRACAPSULAR CATARACT REMOVAL W/INSERTION OF INTRAOCULAR LENS PROSTHESIS, MANUAL OR MECHANICAL TECHNIQUE;  Surgeon: Betha Brookes, MD;  Location: [facility] OR [facility];  Service: Ophthalmology    RENAL ARTERY STENT  01/11/2020    ROOT CANAL      TUBAL LIGATION  1973    [facility]    WISDOM TOOTH EXTRACTION         Social History:  Tobacco use:   reports that she quit smoking about 39 years ago. Her smoking use included cigarettes. She has been exposed to tobacco smoke. She has never used smokeless tobacco.  Alcohol use:   reports current alcohol use.  Drug use:  reports no history of drug use.      Family History:  Family History   Problem Relation Age of Onset    Heart disease Mother     Hypertension Mother     Dementia Mother     Arthritis Mother     Heart disease Father     Thyroid disease Father     Glaucoma Father     Alcohol abuse Father     Heart attack Father 74    Arthritis Father     Hypertension Father         Massive heart attack    Arthritis Sister     Glaucoma Sister     Hypertension Sister     Diabetes Sister     Diabetes Daughter     Diabetes Paternal Aunt     No Known Problems Brother     No Known Problems Maternal Aunt     No Known Problems Maternal Uncle     No Known Problems Paternal Uncle     No Known Problems Maternal Grandmother     No Known Problems Maternal Grandfather     No Known Problems Paternal Grandmother     No Known Problems Paternal Grandfather     Arthritis Sister         Noticeable in extremities    COPD Sister     Depression Sister     Hypertension Sister     Glaucoma Sister     Asthma Daughter         Ocassional    Hypertension Daughter     Miscarriages / [place] Daughter     Cancer Maternal Aunt         Died more than 17yrs ago    Early death Maternal Aunt     Diabetes Paternal Aunt         4 Aunts    Kidney disease Paternal Aunt     Amblyopia Neg Hx     Blindness Neg Hx     Cancer Neg Hx     Cataracts Neg Hx     Macular degeneration Neg Hx     Retinal detachment Neg Hx     Strabismus Neg Hx     Stroke Neg Hx        Review of Systems:  ROS:  A comprehensive 10+ review of systems was negative unless otherwise stated in the HPI.       Vitals:    02/07/24 1010   BP: 160/64   BP Site: L Arm   BP Position: Sitting   BP Cuff Size: Small   Pulse: 83   Temp: 36.1 ??C (96.9 ??F)   TempSrc: Temporal   SpO2: 98%       Physical Exam  General: alert, oriented, no distress  HEENT: Anicteric sclerae  RESP: Relaxed respiratory effort.     ABD: NTTP, normal bowel sounds  MSK: NTTP right upper thigh  SKIN: Appropriately warm and dry  NEURO: no focal deficits,   EXT:  No peripheral edema      Medication adherence and barriers to the treatment plan have been addressed. Opportunities to optimize healthy behaviors have been discussed. Patient / caregiver voiced understanding.        I attest that I, Patsy Booze, personally documented this note while acting as scribe for Arnetta Lank, [place].      Patsy Booze, Scribe.  02/07/2024  The documentation recorded by the scribe accurately reflects the service I personally performed and the decisions made by me.    Milfred Krammes D Austyn Seier, PA

## 2024-02-07 NOTE — Unmapped (Signed)
 [facility] - INITIAL EVALUATION    PRIMARY CARE TEAM: Powell-Tillman, Levonne Genese, MD    ENCOUNTER DATE: 02/01/2024    CHIEF COMPLAINT:   Chief Complaint   Patient presents with    New Diagnosis     REASON FOR VISIT: A 83 y.o. female referred for initial consultation by Emogene Harpin, * for evaluation and recommendations regarding lytic bone lesions with concern for malignancy.  --------------------------------------------------------------------------------------------------------------------------------------  History of Present Illness    Kayla Knight is an 83 year old female with a history of diverticulitis who presents with persistent lower abdominal pain.    She has experienced persistent lower abdominal pain for four months, initially improving with a liquid diet but persisting after resuming a regular diet. The pain worsens when lying on her right side, affecting sleep, and is managed with Tylenol. There is no blood in stool, diarrhea, or hematemesis. She has lost 20 pounds over this period and experiences night sweats.    Her medical history includes endometriosis with significant past bleeding and anemia requiring transfusion, but no anemia since menopause. She has not undergone a colonoscopy recently, although one was scheduled after her diverticulitis diagnosis. She has no personal or family history of cancer. A lipoma on her side has grown recently.         PAST MEDICAL HISTORY: Reviewed and updated  Past Medical History:   Diagnosis Date    Anemia     past history, over 40 yrs ago    Cataract 2008    Surgery    Diabetes mellitus       Type II, on 9/15 patient stated she was not longer diabetic, not taking meds    Dry eye     Hearing impairment     Heart murmur 2016    [facility]. Jacquiline Maul @ [facility]    Heart valve disease     Heart mumur, pt reports diagnosis 10 yrs ago (05/10/20)    History of stent insertion of renal artery 01/29/2020    ASA indefinitely and plavix x 6 months per procedure note    Hypertension     pt reports taking meds since about 1995    Neuromuscular disorder   2014    Feet & Legs    Tear of meniscus of knee     4.5 yrs ago    Urinary incontinence     seen a doctor about nighttime bathroom use (adressed in 2019)       PAST SURGICAL HISTORY: Reviewed and updated  Past Surgical History:   Procedure Laterality Date    CATARACT EXTRACTION W/ INTRAOCULAR LENS IMPLANT Left 02/27/2016    EYE SURGERY  01/30/16    Cataract surgery is scheduled.    JOINT REPLACEMENT  Knee    KNEE CARTILAGE SURGERY Right 2007, 2008    x 2    KNEE SURGERY      pt reports total left knee joint replacement surgery around 2016, 2017    PLANTAR FASCIECTOMY Left     PR REVASCULARIZE FEM/POP ARTERY,ANGIOPLASTY/STENT N/A 01/29/2020    Procedure: Peripheral Angiography W Intervetion;  Surgeon: Harvie Liner, MD;  Location: [facility] CATH;  Service: Cardiology    PR REVASCULARIZE FEM/POP ARTERY,ANGIOPLASTY/STENT Right 12/26/2021    Procedure: right renal artery intervention;  Surgeon: Harvie Liner, MD;  Location: [facility] CATH;  Service: Cardiology    PR UPPER GI ENDOSCOPY,BIOPSY N/A 04/01/2021    Procedure: UGI ENDOSCOPY; WITH BIOPSY, SINGLE  OR MULTIPLE;  Surgeon: Earl Glassing, MD;  Location: [facility];  Service: Gastroenterology    PR XCAPSL CTRC RMVL INSJ IO LENS PROSTH W/O ECP Right 02/13/2016    Procedure: EXTRACAPSULAR CATARACT REMOVAL W/INSERTION OF INTRAOCULAR LENS PROSTHESIS, MANUAL OR MECHANICAL TECHNIQUE;  Surgeon: Betha Brookes, MD;  Location: [facility] OR [facility];  Service: Ophthalmology    PR XCAPSL CTRC RMVL INSJ IO LENS PROSTH W/O ECP Left 02/27/2016    Procedure: EXTRACAPSULAR CATARACT REMOVAL W/INSERTION OF INTRAOCULAR LENS PROSTHESIS, MANUAL OR MECHANICAL TECHNIQUE;  Surgeon: Betha Brookes, MD;  Location: [facility] OR [facility];  Service: Ophthalmology    RENAL ARTERY STENT  01/11/2020    ROOT CANAL      TUBAL LIGATION  1973    [facility]    WISDOM TOOTH EXTRACTION         ALLERGIES: Reviewed and updated  Allergies   Allergen Reactions    Penicillins Itching and Swelling    Grass Pollen-June Grass Standard Other (See Comments)     Asthma attack     Lisinopril Cough    Statins-Hmg-Coa Reductase Inhibitors      Ineffective at controlling cholesterol when tried in the past.    Sulfa (Sulfonamide Antibiotics) Itching       CURRENT MEDICATIONS:  @CMEDTSHORT @  I am having Darrall Ellison maintain her empty container, REPATHA SURECLICK, amlodipine, timolol, mag hydrox/aluminum hyd/simeth (ANTACID ANTI-GAS ORAL), famotidine, clopidogrel, spironolactone, MIEBO (PF), albuterol, and magnesium oxide.    FAMILY HISTORY: I have reviewed the patient's medical history in detail and updated the computerized patient record.  family history includes Alcohol abuse in her father; Arthritis in her father, mother, sister, and sister; Asthma in her daughter; COPD in her sister; Cancer in her maternal aunt; Dementia in her mother; Depression in her sister; Diabetes in her daughter, paternal aunt, paternal aunt, and sister; Early death in her maternal aunt; Glaucoma in her father, sister, and sister; Heart attack (age of onset: 48) in her father; Heart disease in her father and mother; Hypertension in her daughter, father, mother, sister, and sister; Kidney disease in her paternal aunt; Miscarriages / Stillbirths in her daughter; No Known Problems in her brother, maternal aunt, maternal grandfather, maternal grandmother, maternal uncle, paternal grandfather, paternal grandmother, and paternal uncle; Thyroid disease in her father.    SOCIAL HISTORY:   Social History     Socioeconomic History    Marital status: Single     Spouse name: None    Number of children: None    Years of education: None    Highest education level: None   Tobacco Use    Smoking status: Former     Current packs/day: 0.00     Types: Cigarettes     Quit date: 05/19/1984 Years since quitting: 39.7     Passive exposure: Past    Smokeless tobacco: Never   Vaping Use    Vaping status: Never Used   Substance and Sexual Activity    Alcohol use: Yes     Comment: occassional    Drug use: Never    Sexual activity: Not Currently     Partners: Male   Social History Narrative    Exercise: water aerobics 3-4 x week for an hour        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        Dentist: [facility] 2018     Social Drivers of Health     Financial Resource Strain: Low Risk  (  01/04/2023)    Overall Financial Resource Strain (CARDIA)     Difficulty of Paying Living Expenses: Not hard at all   Food Insecurity: No Food Insecurity (01/04/2023)    Hunger Vital Sign     Worried About Running Out of Food in the Last Year: Never true     Ran Out of Food in the Last Year: Never true   Transportation Needs: No Transportation Needs (01/04/2023)    PRAPARE - Therapist, art (Medical): No     Lack of Transportation (Non-Medical): No   Physical Activity: Sufficiently Active (01/04/2023)    Exercise Vital Sign     Days of Exercise per Week: 7 days     Minutes of Exercise per Session: 30 min   Stress: No Stress Concern Present (01/04/2023)    [employer] of Occupational Health - Occupational Stress Questionnaire     Feeling of Stress : Only a little   Social Connections: Moderately Isolated (01/04/2023)    Social Connection and Isolation Panel [NHANES]     Frequency of Communication with Friends and Family: More than three times a week     Frequency of Social Gatherings with Friends and Family: More than three times a week     Attends Religious Services: Never     Database administrator or Organizations: Yes     Attends Engineer, structural: More than 4 times per year     Marital Status: Divorced   Housing: Unknown (11/02/2023)    Received from [employer] Stability Vital Sign     Homeless in the Last Year: No     Social History     Social History Narrative Exercise: water aerobics 3-4 x week for an hour        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        Dentist: [facility] 2018     Review of Systems   Constitutional:  Positive for unexpected weight change. Negative for appetite change.   Respiratory:  Negative for cough and hemoptysis.    Gastrointestinal:  Positive for abdominal pain. Negative for abdominal distention, blood in stool, constipation and vomiting.   Genitourinary:  Negative for difficulty urinating, dysuria, hematuria, pelvic pain and vaginal bleeding.    Musculoskeletal:  Positive for back pain.   Neurological: Negative.    Hematological:  Negative for adenopathy.   Psychiatric/Behavioral: Negative.              02/01/24 1354   BP: 140/71   Pulse: 80   Resp: 17   Temp: 36.7 ??C (98.1 ??F)   SpO2: 96%   Weight: 66.3 kg (146 lb 2.6 oz)   Height: 167.6 cm (5' 6)      Physical Exam  Vitals and nursing note reviewed. Exam conducted with a chaperone present.   Constitutional:       Appearance: Normal appearance.   HENT:      Head: Normocephalic and atraumatic.   Eyes:      Extraocular Movements: Extraocular movements intact.   Cardiovascular:      Rate and Rhythm: Normal rate and regular rhythm.   Pulmonary:      Effort: Pulmonary effort is normal. No respiratory distress.   Abdominal:      General: Bowel sounds are normal.      Palpations: Abdomen is soft.   Neurological:      General: No focal deficit present.  Mental Status: She is alert and oriented to person, place, and time.   Psychiatric:         Mood and Affect: Mood normal.         Behavior: Behavior normal.         Thought Content: Thought content normal.         Judgment: Judgment normal.       ECOG PERFORMANCE STATUS: 0 = Fully active, able to carry on all pre-disease performance without restriction    Results    LABS  CBC: Microcytic red blood cells.    RADIOLOGY  CT abdomen: Nodule in right lung, 0.9 cm; stable. Lytic lesion in spine. Lesion in left rib. Lesion in pelvis.  MRI liver: Stable spot in liver. (2023)         Assessment/Plan:     Microcytic anemia  Microcytic anemia identified on recent labs. History of anemia requiring transfusion due to endometriosis-related bleeding. Further evaluation needed to rule out underlying causes.  - Order blood tests including flow cytometry, free light chains, and beta-2 microglobulin.    Bone lesions  Multiple lytic lesions on CT scan in spine, rib, and pelvis. Differential includes metastatic cancer or benign processes. Further imaging and biopsy needed to determine etiology.  - Order PET scan to assess for active cancer in bone lesions.  - Coordinate biopsy of suspicious areas if indicated by PET scan results.    Lung nodule  0.9 cm nodule in right lung identified on CT scan with no growth over time. PET scan to assess for activity and determine if biopsy is needed.  - Include lung nodule in PET scan to evaluate for metabolic activity.    Abdominal pain  Chronic lower abdominal pain for four months, worsened by certain positions. Weight loss of 20 pounds. Colonoscopy needed to evaluate for potential intestinal causes.  - Encourage completion of scheduled colonoscopy.    Diverticulitis  Diverticulitis with ongoing lower abdominal pain. No diverticulitis seen on recent CT scan. Colonoscopy needed to evaluate for chronic inflammation or other colonic pathology.  - Encourage completion of scheduled colonoscopy.    Sleep apnea  Sleep apnea with difficulty tolerating CPAP.    Allergic rhinitis due to pollen  Allergic rhinitis symptoms exacerbated by pollen exposure.    General Health Maintenance  Colonoscopy is overdue. Mammogram was done within the last three years.  - Encourage scheduling and completion of colonoscopy.  - Ensure mammogram is up to date.         All of Lakeisa's questions were answered today, and they understands that they are welcome to call with any questions or concerns that may arise.    This note with created with AI-enabled Ambient listening software. Please forgive any transcription errors and notify writer if changes are required.    I spent a total of 61 minutes on the day of the visit.     This is excluding time spent related to any billed procedures. This time includes face-to-face time with the patient as well as time spent documenting in the medical record, reviewing patient's records and tests, obtaining history, placing orders, communicating with other healthcare professionals, counseling the patient, family, or caregiver, and/or care coordination for the diagnoses above.       Jeremy Monk, MD MSc  Assistant Professor   Division of Oncology, Department of Medicine  [facility]  at [facility]

## 2024-02-08 NOTE — Unmapped (Signed)
 Called pt to make sure she will call to reschedule colonoscopy.  She has called them and they will call her back to make the appt.  Pt knows if they tell her she needs a new order can reach out to us .

## 2024-02-09 NOTE — Unmapped (Signed)
 Copied from CRM [id number]. Topic: Medication Refill - Medication Question  >> Feb 09, 2024  9:04 AM Dorene L wrote:      Lasonja, Lakins contacted the [facility] requesting to speak with the care team     States that she has a question about her medications, has a medication that she would like to know if she should take or not     Please contact Alexee at [phone]    Thank you,   Gwenevere Lent  [facility]   [phone]

## 2024-02-09 NOTE — Unmapped (Signed)
 Pt has questions regarding her medications for her upcoming GI procedure that she would like to speak with a nurse about. Callback [phone]

## 2024-02-10 ENCOUNTER — Inpatient Hospital Stay: Admit: 2024-02-10 | Discharge: 2024-02-11 | Payer: Medicare (Managed Care)

## 2024-02-10 MED ADMIN — Fluorine F-18 FDG 4-40 mCi IV: 10.18 | INTRAVENOUS | @ 18:00:00 | Stop: 2024-02-10

## 2024-02-10 NOTE — Unmapped (Signed)
 Spoke with patient and was advised OK to continue to hold the Plavix until after colonoscopy tomorrow per Dr. Tilda Fogo. Reviewed clear liquid diet today and to start prep solution at 5pm this evening drinking 1/2 gallon. To start drinking remaining 1/2 gallon 4 hours prior to procedure in the morning and to finish no later than 2 hours prior to her procedure. She verbalized understanding.

## 2024-02-11 ENCOUNTER — Inpatient Hospital Stay: Admit: 2024-02-11 | Discharge: 2024-02-11 | Payer: MEDICARE

## 2024-02-11 DIAGNOSIS — R590 Localized enlarged lymph nodes: Principal | ICD-10-CM

## 2024-02-11 DIAGNOSIS — C801 Malignant (primary) neoplasm, unspecified: Principal | ICD-10-CM

## 2024-02-11 MED ORDER — SPIRONOLACTONE 50 MG TABLET
ORAL_TABLET | Freq: Every day | ORAL | 0 refills | 30.00000 days | Status: CP
Start: 2024-02-11 — End: 2024-03-12

## 2024-02-11 MED ADMIN — lidocaine (PF) (XYLOCAINE-MPF) 20 mg/mL (2 %) injection: INTRAVENOUS | @ 15:00:00 | Stop: 2024-02-11

## 2024-02-11 MED ADMIN — Propofol (DIPRIVAN) injection: INTRAVENOUS | @ 15:00:00 | Stop: 2024-02-11

## 2024-02-11 MED ADMIN — sodium chloride (NS) 0.9 % infusion: 10 mL/h | INTRAVENOUS | @ 15:00:00 | Stop: 2024-02-11

## 2024-02-11 MED ADMIN — sodium chloride (NS) 0.9 % infusion: 10 mL/h | INTRAVENOUS | @ 14:00:00 | Stop: 2024-02-11

## 2024-02-11 NOTE — Unmapped (Signed)
 RN Navigator attempted to call patient to update on Dr. Elida Grounds recommendations, had to LVM.     Reviewed this patient's PET CT and she will need a biopsy of mediastinal nodes via Intervention pulmonology. I have placed an urgent referral to interventional pulmonology for evaluation     Looped in Kayla Knight and Kayla Knight as well to help get Kayla Knight scheduled.  Will follow up.

## 2024-02-14 ENCOUNTER — Encounter
Admit: 2024-02-14 | Discharge: 2024-02-15 | Payer: Medicare (Managed Care) | Attending: Internal Medicine | Primary: Internal Medicine

## 2024-02-14 DIAGNOSIS — C3490 Malignant neoplasm of unspecified part of unspecified bronchus or lung: Principal | ICD-10-CM

## 2024-02-14 NOTE — Unmapped (Signed)
 Contact Information     Person Contacted: Patient   Contact Phone number: [phone]   Phone Outcome: Contacted Patient/Caregiver Yes     Kayla Knight is 83 y.o.  participating in a real-time audio and video visit encounter.       Reason for Call    Chief Complaint   Patient presents with    Follow-up           Subjective:    Kayla Knight presents for AV visit along with daughter and granddaughter.      She has been in great pain.    PET 5/15/20205 showed  right upper lobe mass, effusion, mediastinal nodes as well as adrenal thickening, soft tissue lesions, and lytic bone lesions concerning for metastases.  She is to see oncology next Tuesday. Currently taking oxycodone 5 mg/APAP 1g three or four times a day for pain.  It has been helpful.  Today she skipped the oxycodone and is is too much pain even to wash the dishes.  Oxycodone has caused constipation which mkes her pain worse.  She took a dose of Miralax with some relief.    Appetite is diminished but she is able to hydrate.   Not able to shop or cook for herself.  She has lost ~ 20 lbs.      Daughter has questions about evolution of lesion(s) over previous scans as swell as questions about treatment and prognosis. Are there other options for pain which may not cause constipation?     There was 0.9 cm right lower lobe nodule stable since 2021 and 0.6 cm right middle lobe nodule noted on 08/2022 CT-scan of abdomen and pelvis.  CT C/A/P was ordered one year later but chest was not done (? Insurance).  CTA 11/22/2023.showed unchanged 0.9 cm right lower lobe solid nodule and no pathology adenopathy.    I have reviewed the problem list, medications, and allergies and have updated/reconciled them if needed.     Physical Exam:    As part of this visit, no in-person exam was conducted.  If applicable, video visit findings are noted below:      She is alert and fully oriented.  Appears ill.        Assessment & Plan:     Encounter Diagnosis   Name Primary?    Malignant neoplasm of lung, unspecified laterality, unspecified part of lung   Yes        Advised that treatment and prognosis can be better be clarified by oncology with tissue diagnosis.  Encouraged her to continue oxycodone and APAP for pain and treat constipation.   Continue Miralax daily-BID.  Referral for home health for nursing and home health aide.   Reviewed previous imaging reports with the patient and her family in detail.  Follow up as scheduled with appropriate provider or sooner prn.        Orders Placed This Encounter   Procedures    Ambulatory referral to Home Health     Standing Status:   Future     Expected Date:   02/14/2024     Expiration Date:   02/13/2025     Referral Priority:   Routine     Referral Type:   Home Health Care     Referral Reason:   Specialty Services Required     Referral Location:   [facility]     Number of Visits Requested:   1  Signs and symptoms that should prompt further evaluation were reviewed with the patient.      The patient reports they are physically located in [place]  and is currently: at home. I conducted a audio/video visit. I spent  6m 21s on the video call with the patient. I spent an additional 15 minutes on pre- and post-visit activities on the date of service .

## 2024-02-14 NOTE — Unmapped (Signed)
 I spoke to patient regarding her spironolactone per Dr Tilda Fogo MD request. She say she is indeed still taking it. Says she's been spliting the pills and taking 25 mg as that seems to work for her. States she has an oversuplly of meds currently because pharmacy has been filling them a lot due to being seen at hospital and healthcare due to a lot of medical issues lately. She's going to call pharmacy and let them know she is taking her medications ( had previously told them not to fill medications due to the oversupply)

## 2024-02-15 ENCOUNTER — Emergency Department
Admit: 2024-02-15 | Discharge: 2024-02-16 | Disposition: A | Discharge status: Home / Self Care | Payer: Medicare (Managed Care)

## 2024-02-15 LAB — COMPREHENSIVE METABOLIC PANEL
ALBUMIN: 3.1 g/dL — ABNORMAL LOW (ref 3.4–5.0)
ALKALINE PHOSPHATASE: 153 U/L — ABNORMAL HIGH (ref 46–116)
ALT (SGPT): 17 U/L (ref 10–49)
ANION GAP: 16 mmol/L — ABNORMAL HIGH (ref 5–14)
AST (SGOT): 18 U/L (ref <=34)
BILIRUBIN TOTAL: 0.3 mg/dL (ref 0.3–1.2)
BLOOD UREA NITROGEN: 11 mg/dL (ref 9–23)
BUN / CREAT RATIO: 15
CALCIUM: 9.5 mg/dL (ref 8.7–10.4)
CHLORIDE: 99 mmol/L (ref 98–107)
CO2: 22.9 mmol/L (ref 20.0–31.0)
CREATININE: 0.71 mg/dL (ref 0.55–1.02)
EGFR CKD-EPI (2021) FEMALE: 86 mL/min/1.73m2 (ref >=60)
GLUCOSE RANDOM: 157 mg/dL (ref 70–179)
POTASSIUM: 3.7 mmol/L (ref 3.4–4.8)
PROTEIN TOTAL: 7.6 g/dL (ref 5.7–8.2)
SODIUM: 138 mmol/L (ref 135–145)

## 2024-02-15 LAB — CBC W/ AUTO DIFF
BASOPHILS ABSOLUTE COUNT: 0.1 10*9/L (ref 0.0–0.1)
BASOPHILS RELATIVE PERCENT: 0.6 %
EOSINOPHILS ABSOLUTE COUNT: 0 10*9/L (ref 0.0–0.5)
EOSINOPHILS RELATIVE PERCENT: 0.3 %
HEMATOCRIT: 29.9 % — ABNORMAL LOW (ref 34.0–44.0)
HEMOGLOBIN: 10 g/dL — ABNORMAL LOW (ref 11.3–14.9)
LYMPHOCYTES ABSOLUTE COUNT: 1.6 10*9/L (ref 1.1–3.6)
LYMPHOCYTES RELATIVE PERCENT: 18.8 %
MEAN CORPUSCULAR HEMOGLOBIN CONC: 33.3 g/dL (ref 32.0–36.0)
MEAN CORPUSCULAR HEMOGLOBIN: 23.2 pg — ABNORMAL LOW (ref 25.9–32.4)
MEAN CORPUSCULAR VOLUME: 69.8 fL — ABNORMAL LOW (ref 77.6–95.7)
MEAN PLATELET VOLUME: 6.6 fL — ABNORMAL LOW (ref 6.8–10.7)
MONOCYTES ABSOLUTE COUNT: 0.8 10*9/L (ref 0.3–0.8)
MONOCYTES RELATIVE PERCENT: 9.6 %
NEUTROPHILS ABSOLUTE COUNT: 5.9 10*9/L (ref 1.8–7.8)
NEUTROPHILS RELATIVE PERCENT: 70.7 %
NUCLEATED RED BLOOD CELLS: 0 /100{WBCs} (ref <=4)
PLATELET COUNT: 574 10*9/L — ABNORMAL HIGH (ref 150–450)
RED BLOOD CELL COUNT: 4.28 10*12/L (ref 3.95–5.13)
RED CELL DISTRIBUTION WIDTH: 16.3 % — ABNORMAL HIGH (ref 12.2–15.2)
WBC ADJUSTED: 8.3 10*9/L (ref 3.6–11.2)

## 2024-02-15 LAB — LIPASE: LIPASE: 21 U/L (ref 12–53)

## 2024-02-15 NOTE — Unmapped (Unsigned)
 Spoke with Kayla Knight patients daughter who states that the patient has been vomiting and has an unsteady gait. She thinks that it's due to taking oxycodone, inquired if she was eating prior to taking her medication she states that the patient ate some crackers prior to taking oxycodone. She also stated that the patient reported taking 2 tablets of oxycodone instead of one last night and today due to pain. Kayla Knight that the patient should take the medication as prescribed and not increase her dose with out consulting her pcp. Kayla Knight verbalized understanding. Advised Kayla Knight to take the patient to the to the ER to be evaluated. Kayla Knight verbalized understanding.

## 2024-02-15 NOTE — Unmapped (Signed)
 Message sent to provider in another encounter as high priority.

## 2024-02-15 NOTE — Unmapped (Signed)
 [facility] Emergency Department Provider Note    Patient Name: Kayla Knight  Chief Complaint: Abdominal Pain       ED Clinical Impression     Final diagnoses:   Abdominal pain, unspecified abdominal location (Primary)   Pleural effusion on right   Lytic bone lesion of hip   Mass of upper lobe of right lung        Impression, ED Course, Assessment and Plan   MDM  This is a 83 year old female with a medical history of newly diagnosed right upper lobe lung mass on PET 02/10/2024 with concerns for metastasis and lytic bone lesions, presenting to the emergency department secondary to a 1 week history of progressively worsening diffuse abdominal pain, associated with nausea, vomiting, constipation, decreased appetite after starting oxycodone for pain control 1 week prior.  Recent colonoscopy on 02/11/2024 showed 6 mm polyp, diverticulosis without malignancy, active bleeding.   On arrival, vital signs significant for blood pressure 177/75, however patient afebrile, temp 36.7, heart rate 77 bpm.  Physical examination shows moderate tenderness to palpation in all 4 quadrants, most prominent along the right upper quadrant without rebound, guarding, rigidity, overlying skin changes or masses.  Hypoactive bowel sounds.  No fluid wave.  Cap refill less than 2 seconds.  No conjunctival pallor.  Lungs are clear to auscultation bilaterally.    EKG shows sinus rhythm at a rate of 90 bpm.  No significant ST segment elevations or depressions noted.  No acute ectopic beats.  Normal intervals.  Plain film of chest shows evidence of bilateral lower lobe opacities, differentials including atelectasis, aspiration, pneumonia.  However, no displaced rib fractures, pneumothorax.  Laboratory studies showed no evidence of leukocytosis, WBC 8.3.  Evidence of microcytic anemia, hemoglobin 10.0, MCV 69.8.  Otherwise, no significant metabolic derangements noted.  Lipase within normal limits at 21.  High-sensitivity troponin within normal is at 23.  Urinalysis shows large leukocyte esterase, nitrite negative, trace blood, WBC 34, bacteria rare, squamous epithelial 5.    CT abdomen/pelvis shows no acute intra-abdominal process without evidence of obstruction, fluid collection, free air.  Evidence of unchanged appearance of ill-defined lucent skeletal lesions.  Upon reevaluation, patient remains in no significant distress.  Abdomen remains benign soft, without rebound, guarding, rigidity.  Patient continues to deny dysuria, urgency/frequency, hematuria.  Given no immunosuppression at this time, will plan to monitor urinary symptoms, follow-up on urine culture.    Patient's primary concern is pain control to her right subcostal pain, hip pain, abdominal pain at home.  PET scan demonstrated hypermetabolic right paratracheal, subcarinal, and cardiophrenic lymph nodes along with lytic lesions to the right 5th rib, multiple vertebral bodies and bilateral iliac wings, pubic symphysis, proximal femurs, which is most likely the primary cause of pain. Given no fever, cough, hemoptysis, URI symptoms, CXR findings most likely atelectasis with a component of known history of right pleural effusion. Reassuringly, SpO2 >96% on room air without respiratory distress.   Ambulated patient with steady gait, without requiring assistance. She does report improvement in pain symptoms with analgesia provided in the emergency department.   Patient will be given prescriptions for lidocaine patch, MiraLAX, and increase her oxycodone dose from 5 mg to 10 mg every 6 hours as needed.  Offered inpatient admission for pain control, however their daughter and mother report they are in discussion with their outpatient provider setting up [facility] services. Instructed patient to follow-up with her oncologist regarding next steps for cancer workup.  Instructed patient to follow-up with  her primary care provider for optimization of long-term pain regimen.  Strict return precaution discussed with patient and her daughter at bedside, who convey understanding.    Discussion of Management with other Physicians, QHP or Appropriate Source: N/A  Independent Interpretation of Studies: CXR, CT abd/pelvis, EKG, Labs  External Records Reviewed: Patient's most recent outpatient clinic note  Escalation of Care, Consideration of Admission/Observation/Transfer: N/A  Social determinants that significantly affected care: None applicable  Prescription drug(s) considered but not prescribed: N/A  Diagnostic tests considered but not performed: N/A  History obtained from other sources: Family- Daughter at bedside       I have reviewed the vital signs and the nursing notes. Labs and radiology results that were available during my care of the patient were independently reviewed by me and considered in my medical decision making.     I reviewed the patient's prior medical records.  I independently visualized the EKG tracing.  I independently visualized the radiology images.      Disposition:   The patient was discharged home in stable condition. The patient will follow up with her oncologist.       History   History obtained from: Patient   This is a 83 year old female with a medical history of newly diagnosed right upper lobe mass on PET 02/10/2024 with concerns of metastasis, lytic bone lesions, presenting to the emergency department secondary to a 1 week history of progressive worsening diffuse abdominal pain, associated with nausea, vomiting, constipation, decreased appetite.  Of note, patient was recently prescribed oxycodone 1 week prior.  Last colonoscopy was 02/11/2024, which showed a 6 mm polyp, along with diverticulosis.  Otherwise, patient reports she has been having 1 bowel movement approximately every 3 days after starting oxycodone, small-volume.  However, denies fever, chills, syncope, loss consciousness, chest pain, hemoptysis, hematemesis, melena, hematochezia, dysuria, urgency or frequency, hematuria, vaginal bleeding, vaginal discharge.    Past Medical/Past Surgical History:   Reviewed in EHR including nursing documentation as outlined. Pertinent PMH/PSH also noted above in HPI.   Past Medical History:   Diagnosis Date    Anemia     past history, over 40 yrs ago    Cataract 2008    Surgery    Diabetes mellitus       Type II, on 9/15 patient stated she was not longer diabetic, not taking meds    Dry eye     Hearing impairment     Heart murmur 2016    [facility]. Jacquiline [facility]ul [facility]    Heart valve disease     Heart mumur, pt reports diagnosis 10 yrs ago (05/10/20)    History of stent insertion of renal artery 01/29/2020    ASA indefinitely and plavix x 6 months per procedure note    Hypertension     pt reports taking meds since about 1995    Neuromuscular disorder   2014    Feet & Legs    Tear of meniscus of knee     4.5 yrs ago    Urinary incontinence     seen a doctor about nighttime bathroom use (adressed in 2019)     Patient Active Problem List   Diagnosis    Benign essential hypertension    Primary osteoarthritis of right knee    Idiopathic peripheral neuropathy    MR (mitral regurgitation)    Type 2 diabetes mellitus without complication, without long-term current use of insulin      Neck pain  Encounter for subsequent annual wellness visit (AWV) in Medicare patient    Lipoma of torso    Skin lesion    Hearing loss    Lichen sclerosus et atrophicus of the vulva    Renal artery stenosis, native, bilateral (CMS-HCC)    Hyperlipidemia, unspecified    Aftercare following joint replacement    Presence of left artificial knee joint    Hypokalemia    History of chest pain    Decreased GFR    Nonrheumatic aortic valve insufficiency    Endometrial thickening on ultrasound    Weakness of both hips    Right hip pain    Hot flashes    Aortic valve disease    Post-nasal drip    Witnessed episode of apnea    Glaucoma suspect of both eyes    Dry eye syndrome, bilateral    PVD (posterior vitreous detachment), bilateral    S/P cataract extraction and insertion of intraocular lens, left    S/P cataract extraction and insertion of intraocular lens, right    Meibomian gland dysfunction (MGD), bilateral, both upper and lower lids    Left flank pain    Malignant neoplasm of upper lobe of right lung      Drug-induced constipation     Past Surgical History:   Procedure Laterality Date    CATARACT EXTRACTION W/ INTRAOCULAR LENS IMPLANT Left 02/27/2016    EYE SURGERY  01/30/16    Cataract surgery is scheduled.    JOINT REPLACEMENT  Knee    KNEE CARTILAGE SURGERY Right 2007, 2008    x 2    KNEE SURGERY      pt reports total left knee joint replacement surgery around 2016, 2017    PLANTAR FASCIECTOMY Left     PR COLONOSCOPY FLX DX W/COLLJ SPEC WHEN PFRMD N/A 02/11/2024    Procedure: COLONOSCOPY, FLEXIBLE, PROXIMAL TO SPLENIC FLEXURE; DIAGNOSTIC, W/WO COLLECTION SPECIMEN BY BRUSH OR WASH;  Surgeon: Earl Glassing, MD;  [facility]: [facility];  Service: Gastroenterology    PR REVASCULARIZE FEM/POP ARTERY,ANGIOPLASTY/STENT N/A 01/29/2020    Procedure: Peripheral Angiography W Intervetion;  Surgeon: Harvie Liner, MD;  Location: [facility] CATH;  Service: Cardiology    PR REVASCULARIZE FEM/POP ARTERY,ANGIOPLASTY/STENT Right 12/26/2021    Procedure: right renal artery intervention;  Surgeon: Harvie Liner, MD;  Location: [facility] CATH;  Service: Cardiology    PR UPPER GI ENDOSCOPY,BIOPSY N/A 04/01/2021    Procedure: UGI ENDOSCOPY; WITH BIOPSY, SINGLE OR MULTIPLE;  Surgeon: Earl Glassing, MD;  Location: [facility]URES [facility];  Service: Gastroenterology    PR XCAPSL CTRC RMVL INSJ IO LENS PROSTH W/O ECP Right 02/13/2016    Procedure: EXTRACAPSULAR CATARACT REMOVAL W/INSERTION OF INTRAOCULAR LENS PROSTHESIS, MANUAL OR MECHANICAL TECHNIQUE;  Surgeon: Betha Brookes, MD;  [facility];  Service: Ophthalmology    PR XCAPSL CTRC RMVL INSJ IO LENS PROSTH W/O ECP Left 02/27/2016    Procedure: EXTRACAPSULAR CATARACT REMOVAL W/INSERTION OF INTRAOCULAR LENS PROSTHESIS, MANUAL OR MECHANICAL TECHNIQUE;  Surgeon: Betha Brookes, MD;  Location: [facility] OR [facility];  Service: Ophthalmology    RENAL ARTERY STENT  01/11/2020    ROOT CANAL      TUBAL LIGATION  1973    [facility]    WISDOM TOOTH EXTRACTION         Social History/Family History:   Reviewed in EMR, I agree with nursing documentation. Additional pertinent social and family history noted in HPI.  Social History     Tobacco Use    Smoking status: Former     Current packs/day: 0.00     Types: Cigarettes     Quit date: 05/19/1984     Years since quitting: 39.7     Passive exposure: Past    Smokeless tobacco: Never   Vaping Use    Vaping status: Never Used   Substance Use Topics    Alcohol use: Yes     Comment: occassional    Drug use: Never     Family History   Problem Relation Age of Onset    Heart disease Mother     Hypertension Mother     Dementia Mother     Arthritis Mother     Heart disease Father     Thyroid disease Father     Glaucoma Father     Alcohol abuse Father     Heart attack Father 86    Arthritis Father     Hypertension Father         Massive heart attack    Arthritis Sister     Glaucoma Sister     Hypertension Sister     Diabetes Sister     Diabetes Daughter     Diabetes Paternal Aunt     No Known Problems Brother     No Known Problems Maternal Aunt     No Known Problems Maternal Uncle     No Known Problems Paternal Uncle     No Known Problems Maternal Grandmother     No Known Problems Maternal Grandfather     No Known Problems Paternal Grandmother     No Known Problems Paternal Grandfather     Arthritis Sister         Noticeable in extremities    COPD Sister     Depression Sister     Hypertension Sister     Glaucoma Sister     Asthma Daughter         Ocassional    Hypertension Daughter     Miscarriages / [place] Daughter     Cancer Maternal Aunt         Died more than 58yrs ago    Early death Maternal Aunt     Diabetes Paternal Aunt         4 Aunts    Kidney disease Paternal Aunt     Amblyopia Neg Hx     Blindness Neg Hx     Cancer Neg Hx     Cataracts Neg Hx     Macular degeneration Neg Hx     Retinal detachment Neg Hx     Strabismus Neg Hx     Stroke Neg Hx        Home Medications:  No current facility-administered medications for this encounter.    Current Outpatient Medications:     albuterol HFA 90 mcg/actuation inhaler, Inhale 2 puffs every six (6) hours as needed., Disp: 8 g, Rfl: 0    amlodipine (NORVASC) 10 MG tablet, Take 1 tablet (10 mg total) by mouth daily., Disp: 90 tablet, Rfl: 3    bisacodyl (DULCOLAX) 10 mg suppository, Insert 1 suppository (10 mg total) into the rectum daily as needed., Disp: , Rfl:     clopidogrel (PLAVIX) 75 mg tablet, TAKE 1 TABLET (75 MG TOTAL) BY MOUTH DAILY., Disp: 90 tablet, Rfl: 2    empty container (SHARPS-A-GATOR DISPOSAL SYSTEM) Misc, Use as directed for sharps disposal, Disp: 1 each, Rfl:  2    evolocumab (REPATHA SURECLICK) 140 mg/mL PnIj, Inject the contents of 1 pen (140 mg) under the skin every fourteen (14) days., Disp: 6 mL, Rfl: 3    lidocaine (LIDODERM) 5 % patch, Place 1 patch on the skin daily. Apply to affected area for 12 hours only each day (then remove patch), Disp: 30 patch, Rfl: 0    mag hydrox/aluminum hyd/simeth (ANTACID ANTI-GAS ORAL), Take by mouth., Disp: , Rfl:     magnesium oxide (MAG-OX) 400 mg (241.3 mg elemental magnesium) tablet, Take 1 tablet (400 mg total) by mouth daily., Disp: , Rfl:     MIEBO, PF, 100 % Drop, Administer to both eyes two (2) times a day., Disp: , Rfl:     ondansetron (ZOFRAN-ODT) 4 MG disintegrating tablet, Dissolve 1 tablet (4 mg total) in the mouth every eight (8) hours as needed for nausea for up to 7 days., Disp: 21 tablet, Rfl: 0    oxyCODONE (ROXICODONE) 5 MG immediate release tablet, Take 2 tablets (10 mg total) by mouth every six (6) hours as needed for pain for up to 14 days., Disp: 40 tablet, Rfl: 0    polyethylene glycol (MIRALAX) 17 gram packet, Take 17 g by mouth daily., Disp: 30 packet, Rfl: 0    spironolactone (ALDACTONE) 50 MG tablet, Take 1 tablet (50 mg total) by mouth daily. Taking 1/2 tablet ?, Disp: 30 tablet, Rfl: 0    ALLERGIES:   Penicillins, Grass pollen-june grass standard, Lisinopril, Statins-hmg-coa reductase inhibitors, and Sulfa (sulfonamide antibiotics)       Physical Exam     ED Triage Vitals [02/15/24 1909]   Enc Vitals Group      BP 177/75      Pulse 78      SpO2 Pulse 78      Resp 16      Temp 36.7 ??C (98 ??F)      Temp Source Oral      SpO2 96 %      Weight 65.3 kg (144 lb)      Height 1.676 m (5' 6)      Head Circumference       Peak Flow       Pain Score       Pain Loc       Pain Education       Exclude from Growth Chart        Vital signs reviewed.  GEN: AOx3. Well-nourished, well-developed. No apparent distress. Speaking in full sentences.  EYES: EOM grossly intact. Pupils round and reactive. No scleral icterus. Clear conjunctiva.  HEAD/NECK /MOUTH: [place]/AT. No pharyngeal erythema. Moist mucous membranes.  CARDIO: RRR. No peripheral edema. Normal capillary refill.  LUNG: CTAB. No retractions or accessory muscle use  GI: Diffuse-tenderness. Non-distended. No masses, rigidity, guarding or rebound.  MSK: Full range of motion. Moving all extremities. No swelling or deformity.  SKIN: Intact. No rashes. No lesions. No hematomas, no ecchymosis, no petechiae.  NEURO: AOx3. No facial asymmetry; no focal motor or sensory deficits.  PSYCH: Awake and alert. Appropriate mood and affect.      LABS AND ED TREATMENT:   Lab reviewed and interpreted by me as above in MDM.   Medications given in the ED:   Medications   ondansetron (ZOFRAN) injection 4 mg (4 mg Intravenous Given 02/16/24 0029)   famotidine (PF) (PEPCID) injection 20 mg (20 mg Intravenous Given 02/16/24 0029)   ketorolac (TORADOL) injection 15 mg (15 mg Intravenous Given 02/16/24  0029)   iohexol (OMNIPAQUE) 350 mg iodine/mL solution 100 mL (100 mL Intravenous Given 02/16/24 0057)   oxyCODONE (ROXICODONE) immediate release tablet 5 mg (5 mg Oral Given 02/16/24 0444)          Radiology     CT Abdomen Pelvis W IV Contrast   Final Result   1.  Unchanged appearance of multiple lucent lesions throughout the axial and appendicular skeleton, which remain concerning for metastatic disease.   2.  Otherwise, no acute intraabdominal or pelvic process.    3.  Moderate right pleural effusion and findings of likely pulmonary edema.      ====================   MODIFIED REPORT:   (02/16/2024 7:11 AM)   This report has been modified from its preliminary version; you may check the prior versions of radiology report, results history link for prior report versions (if they were previously visible in Epic).      -----------------------------------------------         XR Chest 2 views   Final Result   Bilateral lower lobe hazy opacities, which could represent atelectasis, aspiration or infection.         XR Abdomen 1 View   Final Result   Nonobstructive bowel gas pattern.             I independently visualized the radiology images.     Portions of this record have been created using Scientist, clinical (histocompatibility and immunogenetics). Dictation errors have been sought, but may not have been identified and corrected.      Brandon Cairo, MD  02/17/24 [phone]

## 2024-02-15 NOTE — Unmapped (Signed)
 Patient via WC to triage. Reports abdominal pain with constipation. Last normal BM few days ago Endorses nausea with vomiting. Recently dx with cancer and has been taking oxycodone for pain.

## 2024-02-15 NOTE — Unmapped (Signed)
 Call transfer to Triage

## 2024-02-16 ENCOUNTER — Encounter
Admit: 2024-02-16 | Discharge: 2024-02-17 | Payer: Medicare (Managed Care) | Attending: Internal Medicine | Primary: Internal Medicine

## 2024-02-16 DIAGNOSIS — C3411 Malignant neoplasm of upper lobe, right bronchus or lung: Principal | ICD-10-CM

## 2024-02-16 DIAGNOSIS — K5903 Drug induced constipation: Principal | ICD-10-CM

## 2024-02-16 LAB — URINALYSIS WITH MICROSCOPY WITH CULTURE REFLEX PERFORMABLE
BILIRUBIN UA: NEGATIVE
GLUCOSE UA: NEGATIVE
KETONES UA: NEGATIVE
NITRITE UA: NEGATIVE
PH UA: 7 (ref 5.0–9.0)
PROTEIN UA: NEGATIVE
RBC UA: 4 /HPF (ref <=4)
SPECIFIC GRAVITY UA: 1.011 (ref 1.003–1.030)
SQUAMOUS EPITHELIAL: 5 /HPF (ref 0–5)
UROBILINOGEN UA: <2
WBC UA: 34 /HPF — ABNORMAL HIGH (ref 0–5)

## 2024-02-16 LAB — HIGH SENSITIVITY TROPONIN I - SINGLE: HIGH SENSITIVITY TROPONIN I: 23 ng/L (ref <=34)

## 2024-02-16 LAB — LACTATE, VENOUS, WHOLE BLOOD: LACTATE BLOOD VENOUS: 1.2 mmol/L (ref 0.5–1.8)

## 2024-02-16 MED ORDER — POLYETHYLENE GLYCOL 3350 17 GRAM ORAL POWDER PACKET
PACK | Freq: Every day | ORAL | 0 refills | 30.00000 days | Status: CP
Start: 2024-02-16 — End: 2024-03-17

## 2024-02-16 MED ORDER — OXYCODONE 5 MG TABLET
ORAL_TABLET | Freq: Four times a day (QID) | ORAL | 0 refills | 5.00000 days | Status: CP | PRN
Start: 2024-02-16 — End: 2024-03-01

## 2024-02-16 MED ORDER — ONDANSETRON 4 MG DISINTEGRATING TABLET
ORAL_TABLET | Freq: Three times a day (TID) | 0 refills | 7.00000 days | Status: CP | PRN
Start: 2024-02-16 — End: 2024-02-23

## 2024-02-16 MED ORDER — LIDOCAINE 5 % TOPICAL PATCH
MEDICATED_PATCH | TRANSDERMAL | 0 refills | 30.00000 days | Status: CP
Start: 2024-02-16 — End: 2024-03-17

## 2024-02-16 MED ADMIN — iohexol (OMNIPAQUE) 350 mg iodine/mL solution 100 mL: 100 mL | INTRAVENOUS | @ 05:00:00 | Stop: 2024-02-16

## 2024-02-16 MED ADMIN — ketorolac (TORADOL) injection 15 mg: 15 mg | INTRAVENOUS | @ 04:00:00 | Stop: 2024-02-16

## 2024-02-16 MED ADMIN — oxyCODONE (ROXICODONE) immediate release tablet 5 mg: 5 mg | ORAL | @ 09:00:00 | Stop: 2024-02-16

## 2024-02-16 MED ADMIN — lidocaine (ASPERCREME) 4 % 1 patch: 1 | TRANSDERMAL | @ 05:00:00 | Stop: 2024-02-16

## 2024-02-16 MED ADMIN — famotidine (PF) (PEPCID) injection 20 mg: 20 mg | INTRAVENOUS | @ 04:00:00 | Stop: 2024-02-16

## 2024-02-16 MED ADMIN — ondansetron (ZOFRAN) injection 4 mg: 4 mg | INTRAVENOUS | @ 04:00:00 | Stop: 2024-02-16

## 2024-02-16 NOTE — Unmapped (Signed)
 Attempted to call patient before video visit. Call went to voicemail. Patient logged in for video visit.

## 2024-02-16 NOTE — Unmapped (Signed)
 Patients daughter [name] called and was wondering if her mothers referral for home health can be faxed to the [facility] in [place] instead. Their fax is [phone]

## 2024-02-16 NOTE — Unmapped (Signed)
 Contact Information     Person Contacted: Patient   Contact Phone number: [phone]   Phone Outcome: Contacted Patient/Caregiver Yes     Kayla Knight is 83 y.o.  participating in a real-time audio and video visit encounter.       Reason for Call    Chief Complaint   Patient presents with    Follow-up           Subjective:    Kayla Knight presents in follow up, accompanied by her daughter:     She was seen in ED yesterday with nausea, vomiting, constipation and abdominal pain.  Record was reviewed in detail.  She also felt wobbly and nearly fell.  She had increased dose of oxycodone to 10 mg.  Workup was without acute findings except pyuria.  CT-scan of abdomen and pelvis did not show visceral lesion or bowel obstruction.  Topical lidocaine, PEG and oxycodone 10 mg q 6 hrs prn were recommended.  Antibiotics were deferred  pending urine culture.  She has not had a bowel movement yesterday or today.   She is now at her daughters home.  Daughter has been in contact with [facility].   She has questions about DME, medications, home medication infusion. Last bowel movement was two days ago.  She has had a few doses of oxycodone today which she has tolerated without difficulty.        I have reviewed the problem list, medications, and allergies and have updated/reconciled them if needed.     Physical Exam:    As part of this visit, no in-person exam was conducted.  If applicable, video visit findings are noted below:      She is alert and oriented.  Appears thin.        Assessment & Plan:     Encounter Diagnoses   Name Primary?    Malignant neoplasm of upper lobe of right lung   Yes    Drug-induced constipation         Detailed medication counseling.  Continue oxycodone.  Interaction with clopidogrel was a concern in ED where ketorolac was considered.  Advised that side effects of opioids are class effects and not necessarily better with hydromorphone or codeine.  Palliative care referral was offered.  Medications per orders.  Indications, pharmacology, and potential adverse effects (to include significant interactions) were reviewed with the patient.   Role of HH versus personal care services was described.  Advised daughter to contact [employer] to determine level of coverage (if any) patient has.  Follow up as scheduled with appropriate provider or sooner prn.    Take PEG twice daily.  May add senna if insufficient.  Recommended bisacodyl suppository today to get things started.  Follow up prn.      No orders of the defined types were placed in this encounter.         Signs and symptoms that should prompt further evaluation were reviewed with the patient.      The patient reports they are physically located in [place]  and is currently: not at home (daughter's home).   I conducted a audio/video visit. I spent  74m 19s on the video call with the patient. I spent an additional 25 minutes on pre- and post-visit activities on the date of service .

## 2024-02-16 NOTE — Unmapped (Signed)
 Called to inform Kayla Knight patients daughter that the patients home health referral was faxed to [facility] the [facility] location, per request. No answer left vm to return a call to the office.

## 2024-02-17 LAB — LIPASE: LIPASE: 23 U/L (ref 12–53)

## 2024-02-17 LAB — CBC W/ AUTO DIFF
BASOPHILS ABSOLUTE COUNT: 0.1 10*9/L (ref 0.0–0.1)
BASOPHILS RELATIVE PERCENT: 0.8 %
EOSINOPHILS ABSOLUTE COUNT: 0 10*9/L (ref 0.0–0.5)
EOSINOPHILS RELATIVE PERCENT: 0.4 %
HEMATOCRIT: 32 % — ABNORMAL LOW (ref 34.0–44.0)
HEMOGLOBIN: 10.6 g/dL — ABNORMAL LOW (ref 11.3–14.9)
LYMPHOCYTES ABSOLUTE COUNT: 1.6 10*9/L (ref 1.1–3.6)
LYMPHOCYTES RELATIVE PERCENT: 17.2 %
MEAN CORPUSCULAR HEMOGLOBIN CONC: 33.2 g/dL (ref 32.0–36.0)
MEAN CORPUSCULAR HEMOGLOBIN: 23.3 pg — ABNORMAL LOW (ref 25.9–32.4)
MEAN CORPUSCULAR VOLUME: 70.2 fL — ABNORMAL LOW (ref 77.6–95.7)
MEAN PLATELET VOLUME: 6.6 fL — ABNORMAL LOW (ref 6.8–10.7)
MONOCYTES ABSOLUTE COUNT: 0.9 10*9/L — ABNORMAL HIGH (ref 0.3–0.8)
MONOCYTES RELATIVE PERCENT: 9.3 %
NEUTROPHILS ABSOLUTE COUNT: 6.9 10*9/L (ref 1.8–7.8)
NEUTROPHILS RELATIVE PERCENT: 72.3 %
NUCLEATED RED BLOOD CELLS: 0 /100{WBCs} (ref <=4)
PLATELET COUNT: 611 10*9/L — ABNORMAL HIGH (ref 150–450)
RED BLOOD CELL COUNT: 4.56 10*12/L (ref 3.95–5.13)
RED CELL DISTRIBUTION WIDTH: 16.7 % — ABNORMAL HIGH (ref 12.2–15.2)
WBC ADJUSTED: 9.6 10*9/L (ref 3.6–11.2)

## 2024-02-17 LAB — LACTATE, VENOUS, WHOLE BLOOD: LACTATE BLOOD VENOUS: 1 mmol/L (ref 0.5–1.8)

## 2024-02-17 LAB — COMPREHENSIVE METABOLIC PANEL
ALBUMIN: 3.2 g/dL — ABNORMAL LOW (ref 3.4–5.0)
ALKALINE PHOSPHATASE: 187 U/L — ABNORMAL HIGH (ref 46–116)
ALT (SGPT): 17 U/L (ref 10–49)
ANION GAP: 17 mmol/L — ABNORMAL HIGH (ref 5–14)
AST (SGOT): 19 U/L (ref <=34)
BILIRUBIN TOTAL: 0.4 mg/dL (ref 0.3–1.2)
BLOOD UREA NITROGEN: 11 mg/dL (ref 9–23)
BUN / CREAT RATIO: 15
CALCIUM: 9.7 mg/dL (ref 8.7–10.4)
CHLORIDE: 98 mmol/L (ref 98–107)
CO2: 23.8 mmol/L (ref 20.0–31.0)
CREATININE: 0.75 mg/dL (ref 0.55–1.02)
EGFR CKD-EPI (2021) FEMALE: 80 mL/min/1.73m2 (ref >=60)
GLUCOSE RANDOM: 111 mg/dL (ref 70–179)
POTASSIUM: 4.3 mmol/L (ref 3.4–4.8)
PROTEIN TOTAL: 8.2 g/dL (ref 5.7–8.2)
SODIUM: 139 mmol/L (ref 135–145)

## 2024-02-17 LAB — PHOSPHORUS: PHOSPHORUS: 3 mg/dL (ref 2.4–5.1)

## 2024-02-17 LAB — MAGNESIUM: MAGNESIUM: 2.3 mg/dL (ref 1.6–2.6)

## 2024-02-17 NOTE — Unmapped (Signed)
 Pt fell from a bar stool at around noon. States she fell asleep then fell. Her daughter witnessed the fall. + headstrike. No LOC. Pt ambulated after the fall. Takes Plavix daily. Also c/o constipation with last BM 3 days ago. DTS negative

## 2024-02-18 ENCOUNTER — Emergency Department
Admit: 2024-02-18 | Discharge: 2024-02-18 | Disposition: A | Discharge status: Home / Self Care | Payer: Medicare (Managed Care) | Attending: Student in an Organized Health Care Education/Training Program

## 2024-02-18 DIAGNOSIS — C3411 Malignant neoplasm of upper lobe, right bronchus or lung: Principal | ICD-10-CM

## 2024-02-18 DIAGNOSIS — Z9181 History of falling: Principal | ICD-10-CM

## 2024-02-18 LAB — URINALYSIS WITH MICROSCOPY WITH CULTURE REFLEX PERFORMABLE
BACTERIA: NONE SEEN /HPF
BILIRUBIN UA: NEGATIVE
GLUCOSE UA: NEGATIVE
HYALINE CASTS: 1 /LPF (ref 0–1)
KETONES UA: NEGATIVE
NITRITE UA: NEGATIVE
PH UA: 6.5 (ref 5.0–9.0)
RBC UA: 3 /HPF (ref <=4)
SPECIFIC GRAVITY UA: 1.019 (ref 1.003–1.030)
SQUAMOUS EPITHELIAL: 1 /HPF (ref 0–5)
UROBILINOGEN UA: <2
WBC UA: 2 /HPF (ref 0–5)

## 2024-02-18 MED ADMIN — morphine 4 mg/mL injection 4 mg: 4 mg | INTRAVENOUS | @ 08:00:00 | Stop: 2024-02-18

## 2024-02-18 MED ADMIN — morphine injection 2 mg: 2 mg | INTRAVENOUS | @ 11:00:00 | Stop: 2024-02-18

## 2024-02-18 MED ADMIN — SMOG ENEMA: 240 mL | RECTAL | @ 06:00:00 | Stop: 2024-02-18

## 2024-02-18 MED ADMIN — SMOG ENEMA: 240 mL | RECTAL | @ 10:00:00 | Stop: 2024-02-18

## 2024-02-18 NOTE — Unmapped (Signed)
 [facility]  Emergency Department Provider Note    Impression     Final diagnoses:   Fall, initial encounter (Primary)   Constipation, unspecified constipation type       HPI & MDM     Vitals: BP 186/90  - Pulse 86  - Temp 37.2 ??C (99 ??F) (Oral)  - Resp 19  - Wt 65.3 kg (144 lb)  - SpO2 91%  - BMI 23.24 kg/m??      Kayla Knight is a 83 y.o. female with notable PMHx for anemia, DM, HTN currently on Plavix presenting for a fall from a barstool that occurred around noon yesterday afternoon.  Patient states that she fell out of the barstool after falling asleep.  Her daughter who was with her witnessed the fall.  She did strike her head but did not lose consciousness.  Patient was ambulatory after the fall.    Afebrile, hemodynamically stable. On exam, patient is overall well-appearing in no acute distress.  Systolic murmur.  Lungs are CTA bilaterally.  Abdomen soft, nondistended, nontender to palpation without rebound or guarding.  No lower extremity edema.  Full range of motion in all 4 extremities without tenderness to palpation.  Head is atraumatic.  No midline spinal tenderness.  No hip tenderness.  Anemia is at patient's baseline.  Lab work overall reassuring.  CT head without evidence of intracranial bleeding and CT cervical spine without evidence of fractures.  No other secondary signs of injury.  Patient does endorse secondary abdominal discomfort secondary to constipation as she was seen in the ER on 02/15/2024 with an abdominal CT that was overall negative for acute pathology aside from stool burden.  Benign abdomen here today but given she has not had a bowel movement, we will proceed with enema at patient's request.    Will continue to monitor and reassess patient.  Please see ED course below for further details.    Diagnostics:  Orders Placed This Encounter   Procedures    Urine Culture    CT Cervical Spine Wo Contrast    CT Head Wo Contrast    CBC w/ Differential    Comprehensive Metabolic Panel    Lipase Magnesium    Phosphorus    Urinalysis with Microscopy with Culture Reflex (Clean Catch)    Lactate, Venous, Whole Blood    ECG 12 Lead    Insert peripheral IV       ED Course as of 02/18/24 0429   Fri Feb 18, 2024   0311 Enema was unsuccessful with very small stool ball evacuated.  Patient will benefit from manual disimpaction however she is requesting medicine before this procedure was performed.  Will place an IV and give the patient 4 mg of morphine prior to manual disimpaction.   0413 Upon DRE, no stool was palpated in the rectal vault so disimpaction was aborted.  Patient requesting discharge home with continued bowel regimen however will provide suppository prior to discharge.  Abdominal discomfort/constipation is secondary to her primary chief complaint following her fall today.  Abdomen soft and nondistended, nontender to palpation given reassuring CT on 02/15/2024 with no change in abdominal pain, no nausea or vomiting, I do not suspect life-threatening process such as bowel obstruction and thus deferred repeat CT today via shared decision making.  Patient be discharged home with recommendations for bowel regimen to include as needed suppositories/enemas at home.  Patient and daughter at bedside understand agree with plan for discharge at this time.  The case was discussed with the attending physician who is in agreement with the above assessment and plan.    Additional MDM Elements         Discussion with other professionals: See MDM above  Independent interpretation: See MDM above                  Physical Exam     Vitals:    02/17/24 2103 02/17/24 2359 02/18/24 0100 02/18/24 0340   BP: 158/93  190/95 186/90   Pulse: 77 78 86 86   Resp: 17 18 19     Temp: 37.1 ??C (98.8 ??F)   37.2 ??C (99 ??F)   TempSrc: Oral   Oral   SpO2: 96% 97% 94% 91%   Weight: 65.3 kg (144 lb)           Constitutional: Well-appearing, NAD.   HEENT: Normocephalic and atraumatic. Conjunctivae are normal. EOMI.  Mucous membranes are moist.   Neck: Full active ROM.  No midline spinal tenderness.  Cardiovascular: See heart rate listed above.  Systolic murmur.  Extremities are warm and well-perfused.  Respiratory: See respiratory rate listed above.  Lungs CTA bilaterally.  Speaking easily in full sentences without audible stridor or wheezing.  Equal chest rise without increased work of breathing.  Gastrointestinal: Abdomen is soft, non-distended, non-tender.  No rebound or guarding.  Musculoskeletal: No long bone deformities.  No lower extremity edema.  Full range of motion all 4 extremities without tenderness to palpation.  No midline spinal tenderness.  Neurologic: No facial asymmetry.  Normal speech and language.  Moving all extremities symmetrically, spontaneously  Skin: Skin is warm, dry.  Psychiatric: Mood and affect are normal.     Past History     PAST MEDICAL HISTORY/PAST SURGICAL HISTORY:   Past Medical History:   Diagnosis Date    Anemia     past history, over 40 yrs ago    Cataract 2008    Surgery    Diabetes mellitus       Type II, on 9/15 patient stated she was not longer diabetic, not taking meds    Dry eye     Hearing impairment     Heart murmur 2016    Dr. Jacquiline [facility]ul @ [facility]    Heart valve disease     Heart mumur, pt reports diagnosis 10 yrs ago (05/10/20)    History of stent insertion of renal artery 01/29/2020    ASA indefinitely and plavix x 6 months per procedure note    Hypertension     pt reports taking meds since about 1995    Neuromuscular disorder   2014    Feet & Legs    Tear of meniscus of knee     4.5 yrs ago    Urinary incontinence     seen a doctor about nighttime bathroom use (adressed in 2019)       Past Surgical History:   Procedure Laterality Date    CATARACT EXTRACTION W/ INTRAOCULAR LENS IMPLANT Left 02/27/2016    EYE SURGERY  01/30/16    Cataract surgery is scheduled.    JOINT REPLACEMENT  Knee    KNEE CARTILAGE SURGERY Right 2007, 2008    x 2    KNEE SURGERY      pt reports total left knee joint replacement surgery around 2016, 2017    PLANTAR FASCIECTOMY Left     PR COLONOSCOPY FLX DX W/COLLJ SPEC WHEN PFRMD N/A 02/11/2024  Procedure: COLONOSCOPY, FLEXIBLE, PROXIMAL TO SPLENIC FLEXURE; DIAGNOSTIC, W/WO COLLECTION SPECIMEN BY BRUSH OR WASH;  Surgeon: Earl Glassing, MD;  Location: [facility];  Service: Gastroenterology    PR REVASCULARIZE FEM/POP ARTERY,ANGIOPLASTY/STENT N/A 01/29/2020    Procedure: Peripheral Angiography W Intervetion;  Surgeon: Harvie Liner, MD;  Location: [facility] CATH;  Service: Cardiology    PR REVASCULARIZE FEM/POP ARTERY,ANGIOPLASTY/STENT Right 12/26/2021    Procedure: right renal artery intervention;  Surgeon: Harvie Liner, MD;  Location: [facility] CATH;  Service: Cardiology    PR UPPER GI ENDOSCOPY,BIOPSY N/A 04/01/2021    Procedure: UGI ENDOSCOPY; WITH BIOPSY, SINGLE OR MULTIPLE;  Surgeon: Earl Glassing, MD;  Location: [facility];  Service: Gastroenterology    PR XCAPSL CTRC RMVL INSJ IO LENS PROSTH W/O ECP Right 02/13/2016    Procedure: EXTRACAPSULAR CATARACT REMOVAL W/INSERTION OF INTRAOCULAR LENS PROSTHESIS, MANUAL OR MECHANICAL TECHNIQUE;  Surgeon: Betha Brookes, MD;  Location: [facility];  Service: Ophthalmology    PR XCAPSL CTRC RMVL INSJ IO LENS PROSTH W/O ECP Left 02/27/2016    Procedure: EXTRACAPSULAR CATARACT REMOVAL W/INSERTION OF INTRAOCULAR LENS PROSTHESIS, MANUAL OR MECHANICAL TECHNIQUE;  Surgeon: Betha Brookes, MD;  Location: [facility] OR [facility];  Service: Ophthalmology    RENAL ARTERY STENT  01/11/2020    ROOT CANAL      TUBAL LIGATION  1973    [facility]    WISDOM TOOTH EXTRACTION         MEDICATIONS:     Current Facility-Administered Medications:     glycerin (adult) suppository 1 suppository, 1 suppository, Rectal, Daily PRN, Linzey Ramser J, DO    Current Outpatient Medications:     albuterol HFA 90 mcg/actuation inhaler, Inhale 2 puffs every six (6) hours as needed., Disp: 8 g, Rfl: 0    amlodipine (NORVASC) 10 MG tablet, Take 1 tablet (10 mg total) by mouth daily., Disp: 90 tablet, Rfl: 3    bisacodyl (DULCOLAX) 10 mg suppository, Insert 1 suppository (10 mg total) into the rectum daily as needed., Disp: , Rfl:     clopidogrel (PLAVIX) 75 mg tablet, TAKE 1 TABLET (75 MG TOTAL) BY MOUTH DAILY., Disp: 90 tablet, Rfl: 2    empty container (SHARPS-A-GATOR DISPOSAL SYSTEM) Misc, Use as directed for sharps disposal, Disp: 1 each, Rfl: 2    evolocumab (REPATHA SURECLICK) 140 mg/mL PnIj, Inject the contents of 1 pen (140 mg) under the skin every fourteen (14) days., Disp: 6 mL, Rfl: 3    lidocaine (LIDODERM) 5 % patch, Place 1 patch on the skin daily. Apply to affected area for 12 hours only each day (then remove patch), Disp: 30 patch, Rfl: 0    mag hydrox/aluminum hyd/simeth (ANTACID ANTI-GAS ORAL), Take by mouth., Disp: , Rfl:     magnesium oxide (MAG-OX) 400 mg (241.3 mg elemental magnesium) tablet, Take 1 tablet (400 mg total) by mouth daily., Disp: , Rfl:     MIEBO, PF, 100 % Drop, Administer to both eyes two (2) times a day., Disp: , Rfl:     ondansetron (ZOFRAN-ODT) 4 MG disintegrating tablet, Dissolve 1 tablet (4 mg total) in the mouth every eight (8) hours as needed for nausea for up to 7 days., Disp: 21 tablet, Rfl: 0    oxyCODONE (ROXICODONE) 5 MG immediate release tablet, Take 2 tablets (10 mg total) by mouth every six (6) hours as needed for pain for up to 14 days., Disp: 40 tablet, Rfl: 0  polyethylene glycol (MIRALAX) 17 gram packet, Take 17 g by mouth daily., Disp: 30 packet, Rfl: 0    spironolactone (ALDACTONE) 50 MG tablet, Take 1 tablet (50 mg total) by mouth daily. Taking 1/2 tablet ?, Disp: 30 tablet, Rfl: 0    ALLERGIES:   Penicillins, Grass pollen-june grass standard, Lisinopril, Statins-hmg-coa reductase inhibitors, and Sulfa (sulfonamide antibiotics)    SOCIAL HISTORY:   Social History     Tobacco Use    Smoking status: Former     Current packs/day: 0.00     Types: Cigarettes     Quit date: 05/19/1984     Years since quitting: 39.7     Passive exposure: Past    Smokeless tobacco: Never   Substance Use Topics    Alcohol use: Yes     Comment: occassional       FAMILY HISTORY:  Family History   Problem Relation Age of Onset    Heart disease Mother     Hypertension Mother     Dementia Mother     Arthritis Mother     Heart disease Father     Thyroid disease Father     Glaucoma Father     Alcohol abuse Father     Heart attack Father 49    Arthritis Father     Hypertension Father         Massive heart attack    Arthritis Sister     Glaucoma Sister     Hypertension Sister     Diabetes Sister     Diabetes Daughter     Diabetes Paternal Aunt     No Known Problems Brother     No Known Problems Maternal Aunt     No Known Problems Maternal Uncle     No Known Problems Paternal Uncle     No Known Problems Maternal Grandmother     No Known Problems Maternal Grandfather     No Known Problems Paternal Grandmother     No Known Problems Paternal Grandfather     Arthritis Sister         Noticeable in extremities    COPD Sister     Depression Sister     Hypertension Sister     Glaucoma Sister     Asthma Daughter         Ocassional    Hypertension Daughter     Miscarriages / [place] Daughter     Cancer Maternal Aunt         Died more than 7yrs ago    Early death Maternal Aunt     Diabetes Paternal Aunt         4 Aunts    Kidney disease Paternal Aunt     Amblyopia Neg Hx     Blindness Neg Hx     Cancer Neg Hx     Cataracts Neg Hx     Macular degeneration Neg Hx     Retinal detachment Neg Hx     Strabismus Neg Hx     Stroke Neg Hx          Radiology     CT Cervical Spine Wo Contrast   Final Result      No evidence of acute intracranial pathology.      Multilevel degenerative changes of the cervical spine without evidence of acute fracture or dislocation.               CT Head Wo Contrast   Final Result  No evidence of acute intracranial pathology.      Multilevel degenerative changes of the cervical spine without evidence of acute fracture or dislocation.                    Laboratory Data     Lab Results   Component Value Date    WBC 9.6 02/17/2024    HGB 10.6 (L) 02/17/2024    HCT 32.0 (L) 02/17/2024    PLT 611 (H) 02/17/2024       Lab Results   Component Value Date    NA 139 02/17/2024    K 4.3 02/17/2024    CL 98 02/17/2024    CO2 23.8 02/17/2024    BUN 11 02/17/2024    CREATININE 0.75 02/17/2024    GLU 111 02/17/2024    CALCIUM 9.7 02/17/2024    MG 2.3 02/17/2024    PHOS 3.0 02/17/2024       Lab Results   Component Value Date    BILITOT 0.4 02/17/2024    PROT 8.2 02/17/2024    ALBUMIN 3.2 (L) 02/17/2024    ALT 17 02/17/2024    AST 19 02/17/2024    ALKPHOS 187 (H) 02/17/2024       Lab Results   Component Value Date    INR 1.04 12/27/2023    APTT 34.1 08/13/2018       Emmy Harper, DO  [employer]    Emergency Medicine, PGY-2    Portions of this record have been created using Dragon dictation software. Dictation errors have been sought, but may not have been identified and corrected.         Alfredo Ano, DO  Resident  02/18/24 [phone]

## 2024-02-18 NOTE — Unmapped (Signed)
 EMAP test result follow-up note    Feb 18, 2024 6:51 PM     I was contacted by the ED resource nurse Shara Das) with regard to [facility]. She was seen in the [facility] Emergency Department on 02/16/2022 at which time a clean catch urine culture was done which has returned with the following result:     Result Note       View Follow-Up Encounter  Urine Culture, Comprehensive 10,000 to 50,000 CFU/mL Streptococcus agalactiae (group b) Abnormal    Group B Strep are susceptible to ampicillin and penicillin.  Please consult the Microbiology Lab [phone]) if  further susceptibility testing is needed.          I have reviewed the patient's chart from this ED visit. 83 y.o. female with notable PMHx for anemia, DM, HTN currently on Plavix presenting for a fall from a barstool that occurred around noon yesterday afternoon.  No dysuria documented.     Given the lack of any documented urinary symptoms, clinical picture is suggestive of asymptomatic bacteriuria and the patient may not require antibiotic treatment.     The ED resource nurse will contact the patient or their representative, inform them of this test result and reassess if she has had any urinary symptoms including dysuria, urinary hesitancy/urgency/frequency or isolated suprapubic pain. If she has had any of these symptoms, a course of amoxicillin 500mg  TID for 7 days will be prescribed. Otherwise, she should be instructed to follow-up within 1 week with her primary care provider for follow-up of this finding.

## 2024-02-18 NOTE — Unmapped (Signed)
 Urine Culture  Order: [id number] - Reflex for Order [id number]   Status: Final result      Clean Catch; Urine  Urine Culture, Comprehensive   10,000 to 50,000 CFU/mL Streptococcus agalactiae (group b)     Patient with a positive urine culture. Patient was not started on antibiotics prior to dispo from ED. EMAP messaged with results.

## 2024-02-19 NOTE — Unmapped (Signed)
 Patient called back and left a message. I was able to call back and speak with patient regarding urine culture. Patient is endorsing isolated suprapubic pain. I explained the provider recommended a course of amoxicillin to clear the bacteria since she was having symptoms. Pharmacy updated. Return precautions reviewed. No further questions.     Prescription called into pharmacy.

## 2024-02-19 NOTE — Unmapped (Signed)
 [facility] ED Post-Discharge Call    Multiple ED visits this month    Contacted patient at [phone] to discuss ED visit. Confirmed patient identity.     We want to ensure you understood your plan of care. Review AVS instructions. Did your discharge instructions answer all of your questions? yes  Have you made a follow-up appointment? yes  Have you filled your prescriptions (if applicable)? New prescription sent to pharmacy today.  Do you have any questions regarding your prescriptions?No  Do you have any other questions? No  Return precautions discussed.

## 2024-02-19 NOTE — Unmapped (Signed)
 I attempted to reach patient regarding urine culture results. No answer at this time. Generic message left with call back number.

## 2024-02-21 NOTE — Unmapped (Signed)
 Thank you for allowing the [facility] Interventional Pulmonology team to participate in your care. Please do not hesitate to contact us  with questions or concerns.     Epic releases test results to MyChart as soon as they are available which means you will see your test/biopsy results before I do. Please keep in mind that our team will call you a week after any biopsy procedure to discuss your results and to formulate a plan.     The best way to reach me for non-urgent matters is through MyChart. I usually respond within one business day but I do not check messages after hours including evenings, weekends, and holidays.     During business hours please contact me via our nurse navigator, Andre Band, at [phone] or page her at [phone].    For scheduling questions or concerns, please call our administrative specialist, Tery Fielding, at [phone].    For urgent issues after hours please call [phone] [facility] Operator) and ask to speak with the Interventional Pulmonary Fellow on call.    Interventional Pulmologists:  Reymundo Caulk A. Grace Laura MD  Santana Cue, MD    Interventional Pulmonary Fellow: Wm Zona Hippo MD

## 2024-02-21 NOTE — Unmapped (Signed)
 [facility] NEW PATIENT EVALUATION    Assessment:     Patient:Kayla Knight (07/12/1941)  Reason for visit: Ms.Kienast is a 83 y.o.female who is being seen in consultation at the request of Dr. Kimani for evaluation of PET avid RUL mass with associated mediastinal and subcarinal lymphadenopathy as well as lytic lesion and R pleural effusion concerning for metastatic disease. Her pain is incredibly poorly controlled despite rapid escalation of opiate therapy over the last week and she is unable to be cared for at home at this time.     Plan:     Referred to the ER for admission given uncontrolled pain refractory to outpatient therapy/need for IV antiemetics and pain control, as well as need for expedited workup   Recommend admission for pain control, expedited palliative care evaluation (role for steroids or other pain control options for bony mets)  Recommend VIR consult on admission for consideration of adrenal biopsy vs alternative site (iliac sites)  4.   Recommend palliative care consult on admission  5.   Pain control, bowel regimen, and antiemetics per primary team and ER      History:     Referring Physician:   Daren Eck, MD  [address]  [place],  [place] [address]    Reason for Visit: RUL mass     HPI: She was initially seen 01/21/24 in the ER for abdominal pain where Xrays showed bony lesions in the R inferior pubic ramus, L posterior medial rib, iliac crest. She was seen in undiagnosed cancer  clinic where PET scan was ordered and she was referred to us .     History provided by patient and her family - daughter, grandson, granddaughter. She has had a rapid decline over 3 weeks, was previously living independently (prepared her own meals, was making quilts for her great granddaughter). In the last week, has moved in with her daughter and was living on her couch until they were able to set up her bed in their living room.     She has had ongoing dyspnea and has been relying on her inhalers - using mostly at nights - with relief. She has lost more than 20lbs in the last few months.     Her paid has rapidly escalated and she is now doubling and tripling her doses of oxycodone at home with lessening effect. She has pain in her abdomen, thighs, hips and back. Oxycodone does help but has been difficult to tolerate due to nausea and constipation which they are now managing with mag citrate and miralax.     She smoked ~1-2 packs per week for 30-9yrs, quit 20 yrs ago. She denies E cig or vaping use. Maternal aunt with uterine cancer but no other lung cancer or other cancer diagnosis in family. 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         PET CT 02/10/24  CTA 11/12/23 - RLL nodule but otherwise no effusions or other nodules compared to PET CT:      Past Medical History:   Diagnosis Date    Anemia     past history, over 40 yrs ago    Cataract 2008    Surgery    Diabetes mellitus       Type II, on 9/15 patient stated she was not longer diabetic, not taking meds    Dry eye     Hearing impairment  Heart murmur 2016    [facility] Jacquiline [facility]ul [facility]    Heart valve disease     Heart mumur, pt reports diagnosis 10 yrs ago (05/10/20)    History of stent insertion of renal artery 01/29/2020    ASA indefinitely and plavix x 6 months per procedure note    Hypertension     pt reports taking meds since about 1995    Neuromuscular disorder   2014    Feet & Legs    Tear of meniscus of knee     4.5 yrs ago    Urinary incontinence     seen a doctor about nighttime bathroom use (adressed in 2019)       Past Surgical History:   Procedure Laterality Date    CATARACT EXTRACTION W/ INTRAOCULAR LENS IMPLANT Left 02/27/2016    EYE SURGERY  01/30/16    Cataract surgery is scheduled.    JOINT REPLACEMENT  Knee    KNEE CARTILAGE SURGERY Right 2007, 2008    x 2    KNEE SURGERY      pt reports total left knee joint replacement surgery around 2016, 2017 PLANTAR FASCIECTOMY Left     PR COLONOSCOPY FLX DX W/COLLJ SPEC WHEN PFRMD N/A 02/11/2024    Procedure: COLONOSCOPY, FLEXIBLE, PROXIMAL TO SPLENIC FLEXURE; DIAGNOSTIC, W/WO COLLECTION SPECIMEN BY BRUSH OR WASH;  Surgeon: Earl Glassing, MD;  Location: [facility];  Service: Gastroenterology    PR REVASCULARIZE FEM/POP ARTERY,ANGIOPLASTY/STENT N/A 01/29/2020    Procedure: Peripheral Angiography W Intervetion;  Surgeon: Harvie Liner, MD;  Location: [facility] CATH;  Service: Cardiology    PR REVASCULARIZE FEM/POP ARTERY,ANGIOPLASTY/STENT Right 12/26/2021    Procedure: right renal artery intervention;  Surgeon: Harvie Liner, MD;  Location: [facility] CATH;  Service: Cardiology    PR UPPER GI ENDOSCOPY,BIOPSY N/A 04/01/2021    Procedure: UGI ENDOSCOPY; WITH BIOPSY, SINGLE OR MULTIPLE;  Surgeon: Earl Glassing, MD;  Location: [facility];  Service: Gastroenterology    PR XCAPSL CTRC RMVL INSJ IO LENS PROSTH W/O ECP Right 02/13/2016    Procedure: EXTRACAPSULAR CATARACT REMOVAL W/INSERTION OF INTRAOCULAR LENS PROSTHESIS, MANUAL OR MECHANICAL TECHNIQUE;  Surgeon: Betha Brookes, MD;  Location: [facility];  Service: Ophthalmology    PR XCAPSL CTRC RMVL INSJ IO LENS PROSTH W/O ECP Left 02/27/2016    Procedure: EXTRACAPSULAR CATARACT REMOVAL W/INSERTION OF INTRAOCULAR LENS PROSTHESIS, MANUAL OR MECHANICAL TECHNIQUE;  Surgeon: Betha Brookes, MD;  Location: [facility];  Service: Ophthalmology    RENAL ARTERY STENT  01/11/2020    ROOT CANAL      TUBAL LIGATION  1973    [facility]    WISDOM TOOTH EXTRACTION         Current Outpatient Medications   Medication Sig Dispense Refill    albuterol HFA 90 mcg/actuation inhaler Inhale 2 puffs every six (6) hours as needed. 8 g 0    amlodipine (NORVASC) 10 MG tablet Take 1 tablet (10 mg total) by mouth daily. 90 tablet 3    bisacodyl (DULCOLAX) 10 mg suppository Insert 1 suppository (10 mg total) into the rectum daily as needed.      clopidogrel (PLAVIX) 75 mg tablet TAKE 1 TABLET (75 MG TOTAL) BY MOUTH DAILY. 90 tablet 2    empty container (SHARPS-A-GATOR DISPOSAL SYSTEM) Misc Use as directed for sharps disposal 1 each 2    evolocumab (REPATHA SURECLICK) 140 mg/mL PnIj Inject the contents of  1 pen (140 mg) under the skin every fourteen (14) days. 6 mL 3    lidocaine (LIDODERM) 5 % patch Place 1 patch on the skin daily. Apply to affected area for 12 hours only each day (then remove patch) 30 patch 0    mag hydrox/aluminum hyd/simeth (ANTACID ANTI-GAS ORAL) Take by mouth.      magnesium oxide (MAG-OX) 400 mg (241.3 mg elemental magnesium) tablet Take 1 tablet (400 mg total) by mouth daily.      MIEBO, PF, 100 % Drop Administer to both eyes two (2) times a day.      ondansetron (ZOFRAN-ODT) 4 MG disintegrating tablet Dissolve 1 tablet (4 mg total) in the mouth every eight (8) hours as needed for nausea for up to 7 days. 21 tablet 0    oxyCODONE (ROXICODONE) 5 MG immediate release tablet Take 2 tablets (10 mg total) by mouth every six (6) hours as needed for pain for up to 14 days. 40 tablet 0    polyethylene glycol (MIRALAX) 17 gram packet Take 17 g by mouth daily. 30 packet 0    spironolactone (ALDACTONE) 50 MG tablet Take 1 tablet (50 mg total) by mouth daily. Taking 1/2 tablet ? 30 tablet 0     No current facility-administered medications for this visit.       Allergies as of 02/22/2024 - Reviewed 02/17/2024   Allergen Reaction Noted    Penicillins Itching and Swelling 01/23/2014    Grass pollen-june grass standard Other (See Comments) 01/21/2024    Lisinopril Cough 01/02/2019    Statins-hmg-coa reductase inhibitors  10/07/2020    Sulfa (sulfonamide antibiotics) Itching 01/23/2014       Family History   Problem Relation Age of Onset    Heart disease Mother     Hypertension Mother     Dementia Mother     Arthritis Mother     Heart disease Father     Thyroid disease Father     Glaucoma Father     Alcohol abuse Father     Heart attack Father 40    Arthritis Father     Hypertension Father         Massive heart attack    Arthritis Sister     Glaucoma Sister     Hypertension Sister     Diabetes Sister     Diabetes Daughter     Diabetes Paternal Aunt     No Known Problems Brother     No Known Problems Maternal Aunt     No Known Problems Maternal Uncle     No Known Problems Paternal Uncle     No Known Problems Maternal Grandmother     No Known Problems Maternal Grandfather     No Known Problems Paternal Grandmother     No Known Problems Paternal Grandfather     Arthritis Sister         Noticeable in extremities    COPD Sister     Depression Sister     Hypertension Sister     Glaucoma Sister     Asthma Daughter         Ocassional    Hypertension Daughter     Miscarriages / Stillbirths Daughter     Cancer Maternal Aunt         Died more than 34yrs ago    Early death Maternal Aunt     Diabetes Paternal Aunt         4 Aunts  Kidney disease Paternal Aunt     Amblyopia Neg Hx     Blindness Neg Hx     Cancer Neg Hx     Cataracts Neg Hx     Macular degeneration Neg Hx     Retinal detachment Neg Hx     Strabismus Neg Hx     Stroke Neg Hx        Social History     Socioeconomic History    Marital status: Single   Tobacco Use    Smoking status: Former     Current packs/day: 0.00     Types: Cigarettes     Quit date: 05/19/1984     Years since quitting: 39.7     Passive exposure: Past    Smokeless tobacco: Never   Vaping Use    Vaping status: Never Used   Substance and Sexual Activity    Alcohol use: Yes     Comment: occassional    Drug use: Never    Sexual activity: Not Currently     Partners: Male   Social History Narrative    Exercise: water aerobics 3-4 x week for an hour        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        Dentist: [facility] 2018     Social Drivers of Health     Financial Resource Strain: Medium Risk (02/10/2024)    Overall Financial Resource Strain (CARDIA)     Difficulty of Paying Living Expenses: Somewhat hard   Food Insecurity: No Food Insecurity (02/10/2024)    Hunger Vital Sign     Worried About Running Out of Food in the Last Year: Never true     Ran Out of Food in the Last Year: Never true   Transportation Needs: No Transportation Needs (02/10/2024)    PRAPARE - Therapist, art (Medical): No     Lack of Transportation (Non-Medical): No   Physical Activity: Sufficiently Active (01/04/2023)    Exercise Vital Sign     Days of Exercise per Week: 7 days     Minutes of Exercise per Session: 30 min   Stress: No Stress Concern Present (01/04/2023)    [employer] of Occupational Health - Occupational Stress Questionnaire     Feeling of Stress : Only a little   Social Connections: Moderately Isolated (01/04/2023)    Social Connection and Isolation Panel [NHANES]     Frequency of Communication with Friends and Family: More than three times a week     Frequency of Social Gatherings with Friends and Family: More than three times a week     Attends Religious Services: Never     Database administrator or Organizations: Yes     Attends Engineer, structural: More than 4 times per year     Marital Status: Divorced   Housing: Low Risk  (02/10/2024)    Housing     Within the past 12 months, have you ever stayed: outside, in a car, in a tent, in an overnight shelter, or temporarily in someone else's home (i.e. couch-surfing)?: No     Are you worried about losing your housing?: No         Review of Systems  As per HPI    Objective:   Vitals: There were no vitals filed for this visit.    Physical Examination:   General appearance - uncomfortable appearing, shifting around in seat   Eyes -  Sclera anicteric, conjunctiva pink  Mouth - MMM  Heart - extremities WWP   Chest - no tachypnea, retractions or cyanosis. Digital clubbing   Skin - normal coloration and turgor  Neurological - alert, oriented, normal speech, no focal findings or movement disorder noted    Labs and Imaging:     Pertinent laboratory values and imaging available in EMR were reviewed

## 2024-02-22 ENCOUNTER — Ambulatory Visit: Admit: 2024-02-22 | Payer: Medicare (Managed Care)

## 2024-02-22 ENCOUNTER — Ambulatory Visit
Admit: 2024-02-22 | Discharge: 2024-03-07 | Disposition: A | Discharge status: Hospice | Payer: Medicare (Managed Care) | Source: Ambulatory Visit

## 2024-02-22 ENCOUNTER — Inpatient Hospital Stay
Admit: 2024-02-22 | Discharge: 2024-03-07 | Disposition: A | Discharge status: Hospice | Payer: Medicare (Managed Care) | Source: Ambulatory Visit

## 2024-02-22 ENCOUNTER — Ambulatory Visit: Admit: 2024-02-22 | Discharge: 2024-02-23 | Payer: Medicare (Managed Care)

## 2024-02-22 ENCOUNTER — Inpatient Hospital Stay: Admit: 2024-02-22 | Discharge: 2024-03-01 | Payer: Medicare (Managed Care)

## 2024-02-22 ENCOUNTER — Inpatient Hospital Stay
Admit: 2024-02-22 | Discharge: 2024-03-07 | Disposition: A | Discharge status: Hospice | Payer: Medicare (Managed Care) | Source: Ambulatory Visit | Attending: Radiation Oncology

## 2024-02-22 ENCOUNTER — Ambulatory Visit: Admit: 2024-02-22 | Discharge: 2024-03-07 | Payer: Medicare (Managed Care)

## 2024-02-22 LAB — BASIC METABOLIC PANEL
ANION GAP: 12 mmol/L (ref 5–14)
BLOOD UREA NITROGEN: 9 mg/dL (ref 9–23)
BUN / CREAT RATIO: 11
CALCIUM: 9.3 mg/dL (ref 8.7–10.4)
CHLORIDE: 100 mmol/L (ref 98–107)
CO2: 24 mmol/L (ref 20.0–31.0)
CREATININE: 0.83 mg/dL (ref 0.55–1.02)
EGFR CKD-EPI (2021) FEMALE: 71 mL/min/1.73m2 (ref >=60)
GLUCOSE RANDOM: 108 mg/dL (ref 70–179)
POTASSIUM: 3.8 mmol/L (ref 3.4–4.8)
SODIUM: 136 mmol/L (ref 135–145)

## 2024-02-22 LAB — CBC W/ AUTO DIFF
BASOPHILS ABSOLUTE COUNT: 0.1 10*9/L (ref 0.0–0.1)
BASOPHILS RELATIVE PERCENT: 0.7 %
EOSINOPHILS ABSOLUTE COUNT: 0.1 10*9/L (ref 0.0–0.5)
EOSINOPHILS RELATIVE PERCENT: 0.9 %
HEMATOCRIT: 31.6 % — ABNORMAL LOW (ref 34.0–44.0)
HEMOGLOBIN: 10.2 g/dL — ABNORMAL LOW (ref 11.3–14.9)
LYMPHOCYTES ABSOLUTE COUNT: 1.9 10*9/L (ref 1.1–3.6)
LYMPHOCYTES RELATIVE PERCENT: 19.4 %
MEAN CORPUSCULAR HEMOGLOBIN CONC: 32.4 g/dL (ref 32.0–36.0)
MEAN CORPUSCULAR HEMOGLOBIN: 22.7 pg — ABNORMAL LOW (ref 25.9–32.4)
MEAN CORPUSCULAR VOLUME: 70.1 fL — ABNORMAL LOW (ref 77.6–95.7)
MEAN PLATELET VOLUME: 6.7 fL — ABNORMAL LOW (ref 6.8–10.7)
MONOCYTES ABSOLUTE COUNT: 1 10*9/L — ABNORMAL HIGH (ref 0.3–0.8)
MONOCYTES RELATIVE PERCENT: 10 %
NEUTROPHILS ABSOLUTE COUNT: 6.9 10*9/L (ref 1.8–7.8)
NEUTROPHILS RELATIVE PERCENT: 69 %
PLATELET COUNT: 687 10*9/L — ABNORMAL HIGH (ref 150–450)
RED BLOOD CELL COUNT: 4.51 10*12/L (ref 3.95–5.13)
RED CELL DISTRIBUTION WIDTH: 16.7 % — ABNORMAL HIGH (ref 12.2–15.2)
WBC ADJUSTED: 9.9 10*9/L (ref 3.6–11.2)

## 2024-02-22 LAB — PROTIME-INR
INR: 1.03
PROTIME: 11.7 s (ref 9.9–12.6)

## 2024-02-22 LAB — HEPATIC FUNCTION PANEL
ALBUMIN: 3.1 g/dL — ABNORMAL LOW (ref 3.4–5.0)
ALKALINE PHOSPHATASE: 172 U/L — ABNORMAL HIGH (ref 46–116)
ALT (SGPT): 26 U/L (ref 10–49)
AST (SGOT): 25 U/L (ref <=34)
BILIRUBIN DIRECT: <0.1 mg/dL (ref 0.00–0.30)
BILIRUBIN TOTAL: 0.3 mg/dL (ref 0.3–1.2)
PROTEIN TOTAL: 7.6 g/dL (ref 5.7–8.2)

## 2024-02-22 LAB — SLIDE REVIEW

## 2024-02-22 LAB — IONIZED CALCIUM VENOUS: CALCIUM IONIZED VENOUS (MG/DL): 4.76 mg/dL (ref 4.40–5.40)

## 2024-02-22 LAB — APTT
APTT: 28.3 s (ref 24.8–38.4)
HEPARIN CORRELATION: 0.2

## 2024-02-22 LAB — GREEN LITHIUM HEPARIN EXTRA TUBE

## 2024-02-22 MED ADMIN — HYDROmorphone (DILAUDID) injection Syrg 0.5 mg: 0.5 mg | INTRAVENOUS | @ 21:00:00 | Stop: 2024-02-22

## 2024-02-22 MED ADMIN — ondansetron (ZOFRAN) injection 4 mg: 4 mg | INTRAVENOUS | @ 21:00:00 | Stop: 2024-02-22

## 2024-02-22 NOTE — Unmapped (Signed)
 Internal Medicine (MEDL) History & Physical    Assessment & Plan:   Kayla Knight is a 83 y.o. female whose presentation is complicated by T2DM, Resistant HTN, Hix of renal artery stenosis s/p sten L renal artery (2021) and R renal artery (2023), HLD, hx diverticulitis, persistent lower abd pain,  PET 02/10/2024 new diagnosed RUL mass with associated mediastinal and subcarinal lymphadenopathy as well as lytic lesion and R pleural effusion concerning for metastatic disease presenting for uncontrolled pain and nausea vomiting in need of expedited cancer workup     Principal Problem:    Uncontrolled pain  Active Problems:    Benign essential hypertension    Renal artery stenosis, native, bilateral (CMS-HCC)    Hyperlipidemia, unspecified    Nonrheumatic aortic valve insufficiency    S/P cataract extraction and insertion of intraocular lens, left    Malignant neoplasm of upper lobe of right lung      Active Problems    Uncontrolled Abdominal Pain - RUL Mass- Adrenal Mass- Iliac and paraspinal lesions c/f malignancy   Pt evaluated by undiagnosed cancer clinic on 02/01/24 with Hematology/Oncology for concerning RUL mass. Pt had recent 40 lb weight loss and night sweats over the past 1-2 months. PET Scan 5/15 showed centrally located right upper lobe lung mass and hypermetabolic adrenal glands, R mediastinal/subcarinal lymph nodes, lytic lesions, soft tissues of the right common iliac, and left paraspinal region above L5, and in the left gluteus maximus muscle, suspicious for malignancy. PET also showed a small right pleural effusion. Her pain regions seem to correlate with hypermetabolic regions from PET. She was previously well controlled with 5mg  of oxycodone q6 hours but pain load has increased over the last 3 weeks with a functional decline. As of yesterday she required 10-15 mg q4 oxycodone for pain management. She has some nausea after taking pain medications. Due to inadequate pain control, outpatient IP clinic sent her to [facility] for better pain management and expedited workup for malignancy. In the ED, she received 0.5 mg Dauladid with adequate pain control. Patient hemodynamically stable   - VIR consult in AM for possible bx of adrenal mass   - NPO @ [facility]  - Pain:   - Acetaminophen 1000 mg q8  - PRN lidocaine patch  - Oxycodone 5mg  q4 PRN moderate pain   - Dilaudid 0.5 mg q4 PRN severe pain   - Order CXR   - Consult palliative care in AM for pain mgmt and GOC   - Miralax 17 g daily   - Senna nightly for constipation  - IV Zofran 4 mg Q8h PRN   - IV Compazine 5 mg PRN  - Nutrition consult   - PT/OT    Resistant HTN - Hx of Renal Artery Stenosis with stent to R and L renal artery  Pt takes amlodipine and spironolactone for refractory HTN s/p bilateral renal artery stent placement. BP on admission is elevated 165/72 likely secondary to uncontrolled pain. Last dose of plavix 5/27 in AM.   - Amlodipine 10 mg daily   - Spironolactone 25 mg daily   - Hold Plavix 75 mg prior to procedure   - Hold Lovenox DVT prior to procedure   - BMP daily     UTI?  Pt was discharged from the ED on 5/23 after a fall at home. She was found to have a urine cultures growing S. Agalactiae. She hit her head during the fall but imaging negative and denies any headache, dizziness, or vision changes.  Endorses some nausea in association with pain medication. She has generalized abd pain. She was unable to pick up her amoxicillin abx bc the orders did not go through. Denies dysuria, urianry frequency, hematuria. No fever or chills.   -Repeat UA and Ucx     Chronic Problems  T2DM  Well controlled w/o medications. Most recent A1c 5.7 on 10/22  - SSI     HLD  Continue home evolocumab    Diverticulitis  Recent colonoscopy on 02/11/2024 showed 6 mm polyp, diverticulosis without malignancy, active bleeding.   - CTM     Asthma   - Albuterol Q6h PRN     Checklist:  Diet: NPO and Regular Diet  DVT PPx: Lovenox 40mg  q24h  Code Status: DNR and DNI    Team Contact Information: Primary Team: Internal Medicine (MEDL)  Primary Resident: Lenny Rack, MD  Resident's Pager: [phone] (Gen MedL Intern - Tower)    Chief Concern:   Uncontrolled pain    Subjective:   Kayla Knight is a 83 y.o. female with pertinent PMHx of T2DM, Resistant HTN, Hix of renal artery stenosis s/p sten L renal artery (2021) and R renal artery (2023), HLD, hx diverticulitis, persistent lower abd pain,  PET 02/10/2024 new diagnosed RUL mass with associated mediastinal and subcarinal lymphadenopathy as well as lytic lesion and R pleural effusion concerning for metastatic disease presenting for uncontrolled pain and nausea vomiting in need of expedited cancer workup     History obtained by patient and patients daughter.     HPI:    Kayla Knight is a 83 year old female with a PMH of T2DM, heart murmur 2/2 valvular disease, refractory HTN s/p bilateral renal artery stent, recent ED visit for UTI that was not treated, OSA, and a RUL mass with possible mets to the adrenals, iliac, and gluteus maximus muscle who presents for admission for uncontrolled abd pain. Her pain started several weeks ago and has been managed outpatient with 5mg  oxycodone. Recently, her pain has acutely worsened yesterday and is requiring more that 10 mg q4 hours. She reports that her abd is diffusely tender, but worse in her suprapubic/groin area. She was previously able to move freely but now is unable to walk or take care of herself due to pain.  She was seen in an outpatient oncology clinic and due top her increasing pain, she was sent here for admission for IV pain management and expedited cancer workup. Kayla Knight has not received a biopsy yet. She reports no fever, chills, headaches, visual changes, dizziness, chest pain. She endorses some occasional SOB without cough or hemoptysis. On 5/23, she was diagnosed with S. Galactiae UTI but she was not treated as the amoxicillin was not sent to the pharmacy. She denies dysuria, hematuria, or frequency    In the ED, she received 0.5 mg Dilaudid with adequate pain control. She is having improvement of pain to about a 4/10. She is hemodynamically stable, afebrile and tolerating some po intake.     Pertinent Surgical Hx  Past Surgical History:   Procedure Laterality Date    CATARACT EXTRACTION W/ INTRAOCULAR LENS IMPLANT Left 02/27/2016    EYE SURGERY  01/30/16    Cataract surgery is scheduled.    JOINT REPLACEMENT  Knee    KNEE CARTILAGE SURGERY Right 2007, 2008    x 2    KNEE SURGERY      pt reports total left knee joint replacement surgery around 2016, 2017    PLANTAR FASCIECTOMY  Left     PR COLONOSCOPY FLX DX W/COLLJ SPEC WHEN PFRMD N/A 02/11/2024    Procedure: COLONOSCOPY, FLEXIBLE, PROXIMAL TO SPLENIC FLEXURE; DIAGNOSTIC, W/WO COLLECTION SPECIMEN BY BRUSH OR WASH;  Surgeon: Earl Glassing, MD;  Location: [facility];  Service: Gastroenterology    PR REVASCULARIZE FEM/POP ARTERY,ANGIOPLASTY/STENT N/A 01/29/2020    Procedure: Peripheral Angiography W Intervetion;  Surgeon: Harvie Liner, MD;  Location: [facility] CATH;  Service: Cardiology    PR REVASCULARIZE FEM/POP ARTERY,ANGIOPLASTY/STENT Right 12/26/2021    Procedure: right renal artery intervention;  Surgeon: Harvie Liner, MD;  Location: [facility] CATH;  Service: Cardiology    PR UPPER GI ENDOSCOPY,BIOPSY N/A 04/01/2021    Procedure: UGI ENDOSCOPY; WITH BIOPSY, SINGLE OR MULTIPLE;  Surgeon: Earl Glassing, MD;  Location: [facility];  Service: Gastroenterology    PR XCAPSL CTRC RMVL INSJ IO LENS PROSTH W/O ECP Right 02/13/2016    Procedure: EXTRACAPSULAR CATARACT REMOVAL W/INSERTION OF INTRAOCULAR LENS PROSTHESIS, MANUAL OR MECHANICAL TECHNIQUE;  Surgeon: Betha Brookes, MD;  Location: [facility] OR [facility];  Service: Ophthalmology    PR XCAPSL CTRC RMVL INSJ IO LENS PROSTH W/O ECP Left 02/27/2016    Procedure: EXTRACAPSULAR CATARACT REMOVAL W/INSERTION OF INTRAOCULAR LENS PROSTHESIS, MANUAL OR MECHANICAL TECHNIQUE;  Surgeon: Betha Brookes, MD;  Location: [facility] OR [facility];  Service: Ophthalmology    RENAL ARTERY STENT  01/11/2020    ROOT CANAL      TUBAL LIGATION  1973    [facility]    WISDOM TOOTH EXTRACTION          Pertinent Family Hx  Family History   Problem Relation Age of Onset    Heart disease Mother     Hypertension Mother     Dementia Mother     Arthritis Mother     Heart disease Father     Thyroid disease Father     Glaucoma Father     Alcohol abuse Father     Heart attack Father 4    Arthritis Father     Hypertension Father         Massive heart attack    Arthritis Sister     Glaucoma Sister     Hypertension Sister     Diabetes Sister     Diabetes Daughter     Diabetes Paternal Aunt     No Known Problems Brother     No Known Problems Maternal Aunt     No Known Problems Maternal Uncle     No Known Problems Paternal Uncle     No Known Problems Maternal Grandmother     No Known Problems Maternal Grandfather     No Known Problems Paternal Grandmother     No Known Problems Paternal Grandfather     Arthritis Sister         Noticeable in extremities    COPD Sister     Depression Sister     Hypertension Sister     Glaucoma Sister     Asthma Daughter         Ocassional    Hypertension Daughter     Miscarriages / [place] Daughter     Cancer Maternal Aunt         Died more than 71yrs ago    Early death Maternal Aunt     Diabetes Paternal Aunt         4 Aunts    Kidney disease Paternal Aunt  Amblyopia Neg Hx     Blindness Neg Hx     Cancer Neg Hx     Cataracts Neg Hx     Macular degeneration Neg Hx     Retinal detachment Neg Hx     Strabismus Neg Hx     Stroke Neg Hx         Pertinent Social Hx   Social History     Socioeconomic History    Marital status: Single   Tobacco Use    Smoking status: Former     Current packs/day: 0.00     Types: Cigarettes     Quit date: 05/19/1984     Years since quitting: 39.7     Passive exposure: Past    Smokeless tobacco: Never   Vaping Use    Vaping status: Never Used   Substance and Sexual Activity    Alcohol use: Yes     Comment: occassional    Drug use: Never    Sexual activity: Not Currently     Partners: Male   Social History Narrative    Exercise: water aerobics 3-4 x week for an hour        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        Dentist: [facility] 2018     Social Drivers of Health     Financial Resource Strain: Medium Risk (02/10/2024)    Overall Financial Resource Strain (CARDIA)     Difficulty of Paying Living Expenses: Somewhat hard   Food Insecurity: No Food Insecurity (02/10/2024)    Hunger Vital Sign     Worried About Running Out of Food in the Last Year: Never true     Ran Out of Food in the Last Year: Never true   Transportation Needs: No Transportation Needs (02/10/2024)    PRAPARE - Therapist, art (Medical): No     Lack of Transportation (Non-Medical): No   Physical Activity: Sufficiently Active (01/04/2023)    Exercise Vital Sign     Days of Exercise per Week: 7 days     Minutes of Exercise per Session: 30 min   Stress: No Stress Concern Present (01/04/2023)    [employer] of Occupational Health - Occupational Stress Questionnaire     Feeling of Stress : Only a little   Social Connections: Moderately Isolated (01/04/2023)    Social Connection and Isolation Panel [NHANES]     Frequency of Communication with Friends and Family: More than three times a week     Frequency of Social Gatherings with Friends and Family: More than three times a week     Attends Religious Services: Never     Database administrator or Organizations: Yes     Attends Engineer, structural: More than 4 times per year     Marital Status: Divorced   Housing: Low Risk  (02/10/2024)    Housing     Within the past 12 months, have you ever stayed: outside, in a car, in a tent, in an overnight shelter, or temporarily in someone else's home (i.e. couch-surfing)?: No     Are you worried about losing your housing?: No        Allergies  Penicillins, Grass pollen-june grass standard, Lisinopril, Statins-hmg-coa reductase inhibitors, and Sulfa (sulfonamide antibiotics)    I reviewed the Medication List. The current list is Accurate  Prior to Admission medications    Medication Dose, Route, Frequency   acetaminophen (TYLENOL) 500  MG tablet 3 tablets, Oral, Every 8 hours   albuterol HFA 90 mcg/actuation inhaler 2 puffs, Inhalation, Every 6 hours PRN   amlodipine (NORVASC) 10 MG tablet 10 mg, Oral, Daily (standard)   bisacodyl (DULCOLAX) 10 mg suppository 10 mg, Rectal, Daily PRN   clopidogrel (PLAVIX) 75 mg tablet 75 mg, Oral, Daily (standard)   empty container (SHARPS-A-GATOR DISPOSAL SYSTEM) Misc Use as directed for sharps disposal   evolocumab (REPATHA SURECLICK) 140 mg/mL PnIj Inject the contents of 1 pen (140 mg) under the skin every fourteen (14) days.   lidocaine (LIDODERM) 5 % patch 1 patch, Transdermal, Every 24 hours, Apply to affected area for 12 hours only each day (then remove patch)   mag hydrox/aluminum hyd/simeth (ANTACID ANTI-GAS ORAL) Take by mouth.   MAGNESIUM CITRATE ORAL 1 tablet, Oral, 3 times a day PRN   magnesium oxide (MAG-OX) 400 mg (241.3 mg elemental magnesium) tablet 400 mg, Daily (standard)   MIEBO, PF, 100 % Drop 2 times a day   ondansetron (ZOFRAN-ODT) 4 MG disintegrating tablet 4 mg, orally disintegrating tablet, Every 8 hours PRN   oxyCODONE (ROXICODONE) 5 MG immediate release tablet 10 mg, Oral, Every 6 hours PRN   polyethylene glycol (MIRALAX) 17 gram packet 17 g, Oral, Daily (standard)   spironolactone (ALDACTONE) 50 MG tablet 50 mg, Oral, Daily (standard), Taking 1/2 tablet ?       Designated Healthcare Decision Maker:  Kayla Knight currently has decisional capacity for healthcare decision-making and is able to designate a surrogate healthcare decision maker. Kayla Knight designated healthcare decision maker(s) is/are Rosell Khouri (the patient's adult child) as denoted by stated patient preference.    Objective:   Physical Exam:  Temp:  [36.6 ??C (97.8 ??F)-36.9 ??C (98.4 ??F)] 36.9 ??C (98.4 ??F)  Pulse:  [71-80] 80  SpO2 Pulse:  [72-89] 79  Resp:  [16-20] 16  BP: (124-168)/(64-73) 168/72  SpO2:  [93 %-99 %] 93 %    Gen: NAD, converses in full sentences   Eyes: Sclera anicteric, EOMI grossly normal   HENT: Atraumatic, normocephalic  Neck: Trachea midline  Heart: RRR, 3/5 systolic murmur, no rubs or gallops. No JVD  Lungs: CTAB, no crackles or wheezes   Abdomen: Soft, TTP in all quadrants worse in RUQ under R breast.   Extremities: No edema  Neuro: A&Ox3 (person, place, time). Grossly symmetric, non-focal    Skin:  No rashes, lesions on clothed exam  Psych: Alert, oriented    Jeevun Kansupada, MS3    I attest that I have reviewed the student note and that the components of the history of the present illness, the physical exam, and the assessment and plan documented were performed by me or were performed in my presence by the student where I verified the documentation and performed (or re-performed) the exam and medical decision making.      Arther Larve, MD   PGY-2, Internal Medicine

## 2024-02-22 NOTE — Unmapped (Signed)
 Pt escorted down from multidisciplinary clinic in cancer hospital for uncontrolled pain - stomach/back/thighs. Metastatic lung and bone cancer, not currently on tx.

## 2024-02-22 NOTE — Unmapped (Signed)
 Addended by: Damia Bobrowski TAYLOR on: 02/22/2024 04:49 PM     Modules accepted: Level of Service

## 2024-02-22 NOTE — Unmapped (Signed)
 Bed: 71-D  Expected date:   Expected time:   Means of arrival:   Comments:  25

## 2024-02-22 NOTE — Unmapped (Addendum)
 83 yo F w/ PMHx of T2DM, Resistant HTN, Hix of renal artery stenosis s/p sten L renal artery (2021) and R renal artery (2023), HLD, hx diverticulitis, persistent lower abd pain. Had PET 02/10/2024 with new diagnosed RUL mass with associated mediastinal and subcarinal lymphadenopathy as well as lytic lesion and R pleural effusion concerning for metastatic disease, so presented for uncontrolled pain, nausea and vomiting in need of expedited cancer workup. Abdominal pain well controlled with aggressive bowel regimen. Underwent VIR guided superficial bone biopsy on 6/3 which showed metastatic disease of unknown origin. Patient and family ultimately opted to go home with hospice to maximize quality time with family.    Active Problems     Uncontrolled Abdominal Pain - Pain Related to Likely Metastatic Disease  Patient presented with uncontrolled pain in the setting of recently discovered RUL lung mass with diffuse bony lesions, concerning for metastatic disease. On arrival, much of her pain seemed to be abdominal pain, favored to be related to opioid-induced constipation as discussed below. After optimization of her bowel regimen, her pain initially improved but progressed severe episodes of pain starting in her mid-lower back with radiation down her left hip and leg. She underwent MRI of thoracic and lumbar spine which was notable for T9 metastatic lesion with compression of spinal cord on the R side. Orthopedic surgery was consulted re: spinal mets and she underwent further imaging including CT Thoracic/Lumbar spine and MRI cervical spine which showed bony metastasis at C3, C7, T1, T9 and numerous lytic lesions. Patient received session of radiation to T8-T10 and L2-S3 in a single fraction, 8 Gy x 1 on 6/10. Palliative care assisted with pain management, regimen titrated and discharged on regimen as below and will be managed by hospice on discharge.  Pain:   - Buprenorphine patch 10 mcg/hr TD q7 days (last placed 03/05/24)  - Tylenol 1000mg  q8h  - Oxycodone 10-15 mg q4h PRN  - Duloxetine 40 mg daily for nerve related pain  - Gabapentin 200 mg PO nightly   - Dexamethasone 2 mg PO daily for bony pain  Nausea:   - Compazine 5-10mg  q8h PRN  - Zofran 4-8mg  q6h PRN 2nd line given risk of constipation    Metastatic Carcinoma of Unknown Origin  Seen at undifferentiated cancer clinic prior to admission. During admission, VIR, medical oncology, and interventional pulmonology were consulted and options for tissue sampling were discussed. Ultimately, the decision was made to pursue biopsy of vertebral lesion with VIR. Per oncology, MRI of the brain was also obtained during admission for staging purposes which did not show evidence of brain metastasis. She underwent VIR superficial bone biopsy on 6/3 and tolerated well. Pathology positive for metastatic carcinoma of unknown origin; possibly lung carcinoma given initial RUL lung mass. Declined EBUS biopsy with pulmonology. After extensive GOC conversation with family and patient, they decided to go home with hospice to maximize quality time with family.     Opioid-induced constipation - Ileus  Patient with worsening constipation and abdominal pain in weeks prior to admission. At home, she was getting Miralax BID and magnesium citrate BID per daughter, with more regular BM and improved pain. CT A/P was obtained and did not reveal any acute intra-abdominal pathology. Repeat KUB with mildly distended bowels concerning for ileus. Started on Naloxegol in addition to miralax, senna, magnesium citrate and suppositories. Regimen on discharge included: senna 2 tablets TID (or 3 tabs BID, whichever is preferred), miralax BID PRN, magnesium citrate 30mL TID PRN and dulcolax suppository  daily PRN with goal of 1-2 BM a day.     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 Patient without neuro deficits. Risk factors include HTN, pre-diabetes, small vessel disease and likely malignancy. Was previously prescribed Repatha for HLD, per daughter patient has not been taking medications for several months. Patient unsure when she last had Repatha injection (last dispensed 12/03/2023). TTE -ve, carotid dopplers without significant stenosis. Per neurology okay for monotherapy (ASA or Plavix). Given discharging to hospice, stopped repatha and continued ASA.    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 BP on admission was elevated to 170s systolic, but this improved with improvement of her pain. Discharged on amlodipine and spironolactone on discharge for comfort.     Asymptomatic bacteruria  Prior to admission, patient was found to have a urine cultures growing S. Agalactiae. She states that she was prescribed amoxicillin but orders did not go through so was never treated. Denied any UTI symptoms including no dysuria, urinary frequency, hematuria. No fever or chills. Repeat UA with only moderate leuk esterase and 14 WBC with urine culture showing mixed urogenital flora, so no evidence of active infection or indication to treat.     Chronic Problems  T2DM  Well controlled without medications. Most recent A1c 5.7 on 10/22. Blood sugars controlled during admission.     HLD  Stopped home evolocumab.     Diverticulitis  Recent colonoscopy on 02/11/2024 showed 6 mm polyp, diverticulosis without malignancy, active bleeding. CT A/P inpatient without evidence of active diverticulitis.     Asthma   Albuterol Q6h PRN was available.

## 2024-02-22 NOTE — Unmapped (Signed)
 [facility] Emergency Department Provider Note         ED Clinical Impression     Final diagnoses:   Metastatic malignant neoplasm, unspecified site   (Primary)       Presenting History and MDM     HPI    Feb 22, 2024 5:28 PM   Kayla Knight is a 83 y.o. female who  has a past medical history of Anemia, Cataract (2008), Diabetes mellitus, Dry eye, Hearing impairment, Heart murmur (2016), Heart valve disease, History of stent insertion of renal artery (01/29/2020), Hypertension, Neuromuscular disorder (2014), Tear of meniscus of knee, and Urinary incontinence. 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  Patient was seen in outpatient clinic today and they are discussing outpatient had to double/triple her oxycodone at home still with worsening pain.  Ultimately given the concern for inability to manage this pain at home in the setting of metastatic cancer patient was sent in for admission as well as likely palliative care consult in the inpatient setting.  Patient also may get biopsy to further assess cancer.  Patient reports she is still having pain diffusely in her bony metastatic sites.  She reports no fevers or chills.  She reports no nausea, vomiting, or diarrhea.  No headaches or visual changes.  Not reporting chest pain or shortness of breath at this time.    Pertinent Chart Review: Reviewed patient's outpatient visit from [facility] oncology from earlier today.  Plan at that time was significant for the following:     Referred to the ER for admission given uncontrolled pain refractory to outpatient therapy/need for IV antiemetics and pain control, as well as need for expedited workup   Recommend admission for pain control, expedited palliative care evaluation (role for steroids or other pain control options for bony mets)  Recommend VIR consult on admission for consideration of adrenal biopsy vs alternative site (iliac sites)  4. Recommend palliative care consult on admission  5.   Pain control, bowel regimen, and antiemetics per primary team and ER      BP 154/71  - Pulse 80  - Temp 36.9 ??C (98.4 ??F)  - Resp 16  - Wt 65.3 kg (144 lb)  - SpO2 93%  - BMI 23.24 kg/m??       Will reassess as we get results and update below      MDM: The patient presents with symptoms of intractable pain at home with a recent diagnosis of metastatic cancer with bony lesion, with a differential led by uncontrolled pain and functional decline in the setting of metastatic cancer, and with considerations for other emergent pathology as below.    Initial Plan in the ED   Labs: CBC to assess for anemia or infection, metabolic panel to evaluate renal function and electrolytes, liver function tests to assess hepatic involvement,    Imaging: Patient was sent to the ED by oncology with plan for admission.  Will defer additional imaging at this time   Initial Management: Pain management with IV Dilaudid.  Will give Zofran for nausea.   Monitoring: Monitor for signs of worsening symptoms, hemodynamic instability, or any signs of tumor complications (e.g., bleeding, obstruction, or infection).    Key Findings   Overall appearance: Weak, cachectic, alert but visibly fatigued, with signs of chronic illness.    Exam pertinent to chief complaint: No abdominal tenderness, no rebound or guarding. Neurological exam consistent with  baseline.    Labs Results: CBC consistent with prior, PT/INR reassuring, ionized calcium is normal.  BMP reassuring.        Clinical Evolution and Updates  ED Course as of 02/22/24 2124   Tue Feb 22, 2024   1804 Patient's labs consistent with baseline.  Patient is given Dilaudid for pain as above.  At this time we will plan for admission.       Differential Diagnosis and Rationale   Uncontrolled pain in the setting of metastatic cancer: Likely, given the patient???s known history of cancer and symptoms of pain that she has not been able to tolerate with home medications.   Infection (e.g., pneumonia, sepsis): Considered due to fever and general malaise, but the absence of localized signs of infection (e.g., cough, dysuria, focal tenderness) and the patient's known cancer history make this diagnosis less likely.   Hypercalcemia of malignancy: I considered hypercalcemia, however, patient has no signs of confusion, constipation, or weakness. Calcium levels are reassuring   Anemia: I considered anemia due to chronic disease or gastrointestinal bleeding. However, the patient's blood work shows no significant decrease in hemoglobin from baseline, and the symptoms are more likely related to known cancer.   Other Considerations:  Paraneoplastic syndromes: Considered but no specific paraneoplastic symptoms (e.g., hypercalcemia, Cushing???s syndrome, neurochanges) have been reported or clearly evident at bedside.  Acute kidney injury (AKI): I considered AKI, particularly in patients with dehydration, sepsis, or medication use that may affect renal function. The patient???s creatinine levels are consistent with her baseline and there are no signs of new fluid overload or severe electrolyte disturbances at this time.  Chronic back pain or musculoskeletal weakness: Considered however, I do believe patient's pain is more related to no bony metastatic lesions.  Psychological factors (e.g., depression, fatigue): Considered, as cancer patients often experience depression, which can exacerbate fatigue and weakness. Further psychological evaluation may be needed if the patient expresses symptoms of depression or emotional distress.    Disposition: Ultimately given patient's metastatic cancer with notably lesions unable to be controlled at home with believe patient requires admission.  I patient medical admitting officer for patient to be evaluated for admission                          Physical Exam     GENERAL APPEARANCE:  AxOx4, chronically ill-appearing.  No acute distress HEAD: Normocephalic and atraumatic.   EYES: Pupils are equal reactive bilaterally.  Extraocular movements intact.  Clear conjunctiva.  No scleral icterus.   EARS: Tympanic membrane's clear bilaterally.  No purulence or swelling of the external auditory canals.  No periauricular tenderness or fullness.   NOSE: Septum is midline.  No evidence of epistaxis or bleeding.   THROAT: Uvula is midline.  No intraoral swelling.  Soft underneath the tongue.  No submandibular tenderness or brawny neck edema.  No pharyngeal erythema or exudates.   NECK:  Supple without lymphadenopathy.  No stiffness or restricted ROM.   CHEST: Chest wall is nontender to palpation.  Chest wall stable.  No evidence of trauma or rash.   HEART:  Rate as above, normal S1/S1, no m/r/g   LUNGS:  CTAB, moving air well. No crackles or wheezes are heard.   ABDOMEN:  Soft, nontender, nondistended with good bowel sounds heard.   BACK: No CVAT, no obvious deformity.  No midline tenderness to palpation.  EXTREMITIES: Full range of motion with some discomfort without cyanosis, clubbing or edema.  NEUROLOGICAL:  Grossly nonfocal. Moving all 4 extremities. CN tested and intact.   SKIN:  Warm and dry without any rash.   PSYCH: No evidence of psychosis.  No suicidal or homicidal ideation       Diagnostic workup as below:     Orders Placed This Encounter   Procedures    Urine Culture    XR Chest Portable    Extra Lab Tubes    CBC w/ Differential    Basic metabolic panel    Hepatic function panel (LFT's)    Ionized Calcium, Venous    Protime-INR    APTT    Basic Metabolic Panel    Magnesium Level    CBC w/ Differential    Urinalysis with Microscopy    Nutrition Therapy Regular/House    NPO Sips with meds; Procedure/Test    Patient may shower    Vital signs    Notify Provider    Weigh patient    Incentive Spirometry    Flush line per protocol    Notify Provider    DNR (Do Not Resuscitate) and DNI (Do Not Intubate)    Inpatient consult to Palliative Care    Inpatient consult to Nutrition Services    Occupational Therapy Eval and Treat    Physical Therapy Eval and Treat    Insert peripheral IV    Saline lock IV    Place Patient in Bed       XR Chest Portable    (Results Pending)        _____________________________________________________________________    The case was discussed with attending physician who is in agreement with the above assessment and plan    Additional Medical Decision Making     I have reviewed the vital signs and the nursing notes. Labs and radiology results that were available during my care of the patient were independently reviewed by me and considered in my medical decision making.   I independently visualized the EKG tracing if performed  I independently visualized the radiology images if performed  I reviewed the patient's prior medical records if available.  Additional history obtained from family if available    Other History     CHIEF COMPLAINT:   Chief Complaint   Patient presents with    Medical Problem    Pain       PAST MEDICAL HISTORY/PAST SURGICAL HISTORY:   Past Medical History:   Diagnosis Date    Anemia     past history, over 40 yrs ago    Cataract 2008    Surgery    Diabetes mellitus       Type II, on 9/15 patient stated she was not longer diabetic, not taking meds    Dry eye     Hearing impairment     Heart murmur 2016    [facility] Jacquiline [facility]ul [facility]    Heart valve disease     Heart mumur, pt reports diagnosis 10 yrs ago (05/10/20)    History of stent insertion of renal artery 01/29/2020    ASA indefinitely and plavix x 6 months per procedure note    Hypertension     pt reports taking meds since about 1995    Neuromuscular disorder   2014    Feet & Legs    Tear of meniscus of knee     4.5 yrs ago    Urinary incontinence     seen a doctor about nighttime bathroom use (adressed in 2019)  Past Surgical History:   Procedure Laterality Date    CATARACT EXTRACTION W/ INTRAOCULAR LENS IMPLANT Left 02/27/2016    EYE SURGERY  01/30/16    Cataract surgery is scheduled.    JOINT REPLACEMENT  Knee    KNEE CARTILAGE SURGERY Right 2007, 2008    x 2    KNEE SURGERY      pt reports total left knee joint replacement surgery around 2016, 2017    PLANTAR FASCIECTOMY Left     PR COLONOSCOPY FLX DX W/COLLJ SPEC WHEN PFRMD N/A 02/11/2024    Procedure: COLONOSCOPY, FLEXIBLE, PROXIMAL TO SPLENIC FLEXURE; DIAGNOSTIC, W/WO COLLECTION SPECIMEN BY BRUSH OR WASH;  Surgeon: Earl Glassing, MD;  Location: [facility];  Service: Gastroenterology    PR REVASCULARIZE FEM/POP ARTERY,ANGIOPLASTY/STENT N/A 01/29/2020    Procedure: Peripheral Angiography W Intervetion;  Surgeon: Harvie Liner, MD;  Location: [facility] CATH;  Service: Cardiology    PR REVASCULARIZE FEM/POP ARTERY,ANGIOPLASTY/STENT Right 12/26/2021    Procedure: right renal artery intervention;  Surgeon: Harvie Liner, MD;  Location: [facility] CATH;  Service: Cardiology    PR UPPER GI ENDOSCOPY,BIOPSY N/A 04/01/2021    Procedure: UGI ENDOSCOPY; WITH BIOPSY, SINGLE OR MULTIPLE;  Surgeon: Earl Glassing, MD;  Location: [facility];  Service: Gastroenterology    PR XCAPSL CTRC RMVL INSJ IO LENS PROSTH W/O ECP Right 02/13/2016    Procedure: EXTRACAPSULAR CATARACT REMOVAL W/INSERTION OF INTRAOCULAR LENS PROSTHESIS, MANUAL OR MECHANICAL TECHNIQUE;  Surgeon: Betha Brookes, MD;  Location: [facility] OR [facility];  Service: Ophthalmology    PR XCAPSL CTRC RMVL INSJ IO LENS PROSTH W/O ECP Left 02/27/2016    Procedure: EXTRACAPSULAR CATARACT REMOVAL W/INSERTION OF INTRAOCULAR LENS PROSTHESIS, MANUAL OR MECHANICAL TECHNIQUE;  Surgeon: Betha Brookes, MD;  Location: [facility] OR [facility];  Service: Ophthalmology    RENAL ARTERY STENT  01/11/2020    ROOT CANAL      TUBAL LIGATION  1973    [facility]    WISDOM TOOTH EXTRACTION         MEDICATIONS:     Current Facility-Administered Medications:     acetaminophen (TYLENOL) tablet 1,000 mg, 1,000 mg, Oral, Q8H, Arther Larve, MD    acetaminophen (TYLENOL) tablet 650 mg, 650 mg, Oral, Q6H PRN, Arther Larve, MD    albuterol (PROVENTIL HFA;VENTOLIN HFA) 90 mcg/actuation inhaler 2 puff, 2 puff, Inhalation, Q6H PRN, Arther Larve, MD    aluminum-magnesium hydroxide-simethicone (MAALOX MAX) 80-80-8 mg/mL oral suspension, 30 mL, Oral, Q4H PRN, Arther Larve, MD    Cecily Cohen ON 02/23/2024] amlodipine (NORVASC) tablet 10 mg, 10 mg, Oral, Daily, Arther Larve, MD    Cecily Cohen ON 02/23/2024] clopidogrel (PLAVIX) tablet 75 mg, 75 mg, Oral, Daily, Arther Larve, MD    dextrose 50 % in water (D50W) 50 % solution 12.5 g, 12.5 g, Intravenous, Q15 Min PRN, Arther Larve, MD    enoxaparin (LOVENOX) syringe 40 mg, 40 mg, Subcutaneous, Q24H, Arther Larve, MD    glucagon injection 1 mg, 1 mg, Intramuscular, Once PRN, Arther Larve, MD    glucose chewable tablet 16 g, 16 g, Oral, Q10 Min PRN, Arther Larve, MD    guaiFENesin (ROBITUSSIN) oral syrup, 200 mg, Oral, Q4H PRN, Arther Larve, MD    HYDROmorphone (DILAUDID) injection Syrg 0.5 mg, 0.5 mg, Intravenous, Q4H PRN, Arther Larve, MD    insulin lispro (HumaLOG) injection 0-20 Units, 0-20 Units, Subcutaneous, ACHS, Arther Larve, MD    lidocaine (ASPERCREME) 4 % 1 patch, 1 patch,  Transdermal, Daily, Arther Larve, MD    melatonin tablet 3 mg, 3 mg, Oral, Nightly PRN, Arther Larve, MD    naloxone (NARCAN) injection 0.1 mg, 0.1 mg, Intravenous, Q5 Min PRN, Arther Larve, MD    ondansetron (ZOFRAN) injection 4 mg, 4 mg, Intravenous, Q8H PRN **OR** [DISCONTINUED] ondansetron (ZOFRAN) injection 8 mg, 8 mg, Intravenous, Q8H PRN, Arther Larve, MD    oxyCODONE (ROXICODONE) immediate release tablet 5 mg, 5 mg, Oral, Q4H PRN, Arther Larve, MD    polyethylene glycol (MIRALAX) packet 17 g, 17 g, Oral, Daily, Arther Larve, MD    prochlorperazine (COMPAZINE) injection 2.5 mg, 2.5 mg, Intravenous, Q8H PRN **OR** prochlorperazine (COMPAZINE) injection 5 mg, 5 mg, Intravenous, Q8H PRN, Arther Larve, MD    senna Alonna Art) tablet 1 tablet, 1 tablet, Oral, Nightly, Arther Larve, MD    Cecily Cohen ON 02/23/2024] spironolactone (ALDACTONE) tablet 25 mg, 25 mg, Oral, Daily, Arther Larve, MD    Current Outpatient Medications:     albuterol HFA 90 mcg/actuation inhaler, Inhale 2 puffs every six (6) hours as needed., Disp: 8 g, Rfl: 0    amlodipine (NORVASC) 10 MG tablet, Take 1 tablet (10 mg total) by mouth daily., Disp: 90 tablet, Rfl: 3    bisacodyl (DULCOLAX) 10 mg suppository, Insert 1 suppository (10 mg total) into the rectum daily as needed., Disp: , Rfl:     clopidogrel (PLAVIX) 75 mg tablet, TAKE 1 TABLET (75 MG TOTAL) BY MOUTH DAILY., Disp: 90 tablet, Rfl: 2    empty container (SHARPS-A-GATOR DISPOSAL SYSTEM) Misc, Use as directed for sharps disposal, Disp: 1 each, Rfl: 2    evolocumab (REPATHA SURECLICK) 140 mg/mL PnIj, Inject the contents of 1 pen (140 mg) under the skin every fourteen (14) days., Disp: 6 mL, Rfl: 3    lidocaine (LIDODERM) 5 % patch, Place 1 patch on the skin daily. Apply to affected area for 12 hours only each day (then remove patch), Disp: 30 patch, Rfl: 0    mag hydrox/aluminum hyd/simeth (ANTACID ANTI-GAS ORAL), Take by mouth., Disp: , Rfl:     magnesium oxide (MAG-OX) 400 mg (241.3 mg elemental magnesium) tablet, Take 1 tablet (400 mg total) by mouth daily., Disp: , Rfl:     MIEBO, PF, 100 % Drop, Administer to both eyes two (2) times a day., Disp: , Rfl:     ondansetron (ZOFRAN-ODT) 4 MG disintegrating tablet, Dissolve 1 tablet (4 mg total) in the mouth every eight (8) hours as needed for nausea for up to 7 days., Disp: 21 tablet, Rfl: 0    oxyCODONE (ROXICODONE) 5 MG immediate release tablet, Take 2 tablets (10 mg total) by mouth every six (6) hours as needed for pain for up to 14 days., Disp: 40 tablet, Rfl: 0    polyethylene glycol (MIRALAX) 17 gram packet, Take 17 g by mouth daily., Disp: 30 packet, Rfl: 0    spironolactone (ALDACTONE) 50 MG tablet, Take 1 tablet (50 mg total) by mouth daily. Taking 1/2 tablet ?, Disp: 30 tablet, Rfl: 0    ALLERGIES:   Penicillins, Grass pollen-june grass standard, Lisinopril, Statins-hmg-coa reductase inhibitors, and Sulfa (sulfonamide antibiotics)    SOCIAL HISTORY:   Social History     Tobacco Use    Smoking status: Former     Current packs/day: 0.00     Types: Cigarettes     Quit date: 05/19/1984     Years since quitting: 39.7     Passive exposure: Past    Smokeless tobacco: Never  Substance Use Topics    Alcohol use: Yes     Comment: occassional       FAMILY HISTORY:  Family History   Problem Relation Age of Onset    Heart disease Mother     Hypertension Mother     Dementia Mother     Arthritis Mother     Heart disease Father     Thyroid disease Father     Glaucoma Father     Alcohol abuse Father     Heart attack Father 62    Arthritis Father     Hypertension Father         Massive heart attack    Arthritis Sister     Glaucoma Sister     Hypertension Sister     Diabetes Sister     Diabetes Daughter     Diabetes Paternal Aunt     No Known Problems Brother     No Known Problems Maternal Aunt     No Known Problems Maternal Uncle     No Known Problems Paternal Uncle     No Known Problems Maternal Grandmother     No Known Problems Maternal Grandfather     No Known Problems Paternal Grandmother     No Known Problems Paternal Grandfather     Arthritis Sister         Noticeable in extremities    COPD Sister     Depression Sister     Hypertension Sister     Glaucoma Sister     Asthma Daughter         Ocassional    Hypertension Daughter     Miscarriages / Stillbirths Daughter     Cancer Maternal Aunt         Died more than 70yrs ago    Early death Maternal Aunt     Diabetes Paternal Aunt         4 Aunts    Kidney disease Paternal Aunt     Amblyopia Neg Hx     Blindness Neg Hx     Cancer Neg Hx     Cataracts Neg Hx     Macular degeneration Neg Hx     Retinal detachment Neg Hx     Strabismus Neg Hx     Stroke Neg Hx Review of Systems    A 10 point review of systems was performed and is negative other than positive elements noted in HPI     Radiology     XR Chest Portable    (Results Pending)       Labs     Labs Reviewed   HEPATIC FUNCTION PANEL - Abnormal; Notable for the following components:       Result Value    Albumin 3.1 (*)     Alkaline Phosphatase 172 (*)     All other components within normal limits   CBC W/ AUTO DIFF - Abnormal; Notable for the following components:    HGB 10.2 (*)     HCT 31.6 (*)     MCV 70.1 (*)     MCH 22.7 (*)     RDW 16.7 (*)     MPV 6.7 (*)     Platelet 687 (*)     Absolute Monocytes 1.0 (*)     Microcytosis Slight (*)     Anisocytosis Slight (*)     Hypochromasia Marked (*)     All other components within normal limits   SLIDE REVIEW -  Abnormal; Notable for the following components:    Smear Review Comments See Comment (*)     Giant Platelets Present (*)     Toxic Vacuolation Present (*)     All other components within normal limits   IONIZED CALCIUM VENOUS - Normal   URINE CULTURE   EXTRA TUBES    Narrative:     The following orders were created for panel order Extra Lab Tubes.                  Procedure                               Abnormality         Status                                     ---------                               -----------         ------                                     GREEN LITHIUM HEPARIN E.Aaron AasAaron Aas[[id number]]                      Final result                               LAVENDER EDTA EXTRA [id number]]                        Final result                               LIGHT BLUE CITRATE EXTR.Aaron AasAaron Aas[[id number]]                      Final result                                                 Please view results for these tests on the individual orders.   CBC W/ DIFFERENTIAL    Narrative:     The following orders were created for panel order CBC w/ Differential.                  Procedure                               Abnormality         Status ---------                               -----------         ------  CBC w/ Differential[[id number]]         Abnormal            Final result                               Morphology [phone]]           Abnormal            Final result                                                 Please view results for these tests on the individual orders.   BASIC METABOLIC PANEL   PROTIME-INR   APTT   URINALYSIS WITH MICROSCOPY   POCT GLUCOSE-RN OBTAIN   GREEN LITHIUM HEPARIN EXTRA TUBE    Narrative:     Collected and received in lab.   LAVENDER EDTA EXTRA TUBE    Narrative:     Collected and received in lab.   LIGHT BLUE CITRATE EXTRA TUBE    Narrative:     Collected and received in lab.       Please note- This chart has been created using [employer]. Chart creation errors have been sought, but may not always be located and such creation errors, especially pronoun confusion, do NOT reflect on the standard of medical care.     Sherrlyn Dolores, MD  Resident  02/22/24 [phone]

## 2024-02-23 LAB — BASIC METABOLIC PANEL
ANION GAP: 11 mmol/L (ref 5–14)
BLOOD UREA NITROGEN: 10 mg/dL (ref 9–23)
BUN / CREAT RATIO: 11
CALCIUM: 9.7 mg/dL (ref 8.7–10.4)
CHLORIDE: 102 mmol/L (ref 98–107)
CO2: 26 mmol/L (ref 20.0–31.0)
CREATININE: 0.91 mg/dL (ref 0.55–1.02)
EGFR CKD-EPI (2021) FEMALE: 64 mL/min/1.73m2 (ref >=60)
GLUCOSE RANDOM: 114 mg/dL (ref 70–179)
POTASSIUM: 4.5 mmol/L (ref 3.4–4.8)
SODIUM: 139 mmol/L (ref 135–145)

## 2024-02-23 LAB — URINALYSIS WITH MICROSCOPY
BACTERIA: NONE SEEN /HPF
BILIRUBIN UA: NEGATIVE
BLOOD UA: NEGATIVE
GLUCOSE UA: NEGATIVE
KETONES UA: NEGATIVE
NITRITE UA: NEGATIVE
PH UA: 6.5 (ref 5.0–9.0)
PROTEIN UA: NEGATIVE
RBC UA: 1 /HPF (ref <=4)
SPECIFIC GRAVITY UA: 1.01 (ref 1.003–1.030)
SQUAMOUS EPITHELIAL: 2 /HPF (ref 0–5)
TRANSITIONAL EPITHELIAL: <1 /HPF (ref 0–2)
UROBILINOGEN UA: <2
WBC UA: 14 /HPF — ABNORMAL HIGH (ref 0–5)

## 2024-02-23 LAB — CBC W/ AUTO DIFF
BASOPHILS ABSOLUTE COUNT: 0.1 10*9/L (ref 0.0–0.1)
BASOPHILS RELATIVE PERCENT: 0.7 %
EOSINOPHILS ABSOLUTE COUNT: 0.1 10*9/L (ref 0.0–0.5)
EOSINOPHILS RELATIVE PERCENT: 0.6 %
HEMATOCRIT: 30.8 % — ABNORMAL LOW (ref 34.0–44.0)
HEMOGLOBIN: 10.1 g/dL — ABNORMAL LOW (ref 11.3–14.9)
LYMPHOCYTES ABSOLUTE COUNT: 1.3 10*9/L (ref 1.1–3.6)
LYMPHOCYTES RELATIVE PERCENT: 13.3 %
MEAN CORPUSCULAR HEMOGLOBIN CONC: 32.7 g/dL (ref 32.0–36.0)
MEAN CORPUSCULAR HEMOGLOBIN: 23.1 pg — ABNORMAL LOW (ref 25.9–32.4)
MEAN CORPUSCULAR VOLUME: 70.6 fL — ABNORMAL LOW (ref 77.6–95.7)
MEAN PLATELET VOLUME: 6.7 fL — ABNORMAL LOW (ref 6.8–10.7)
MONOCYTES ABSOLUTE COUNT: 0.9 10*9/L — ABNORMAL HIGH (ref 0.3–0.8)
MONOCYTES RELATIVE PERCENT: 9 %
NEUTROPHILS ABSOLUTE COUNT: 7.6 10*9/L (ref 1.8–7.8)
NEUTROPHILS RELATIVE PERCENT: 76.4 %
PLATELET COUNT: 663 10*9/L — ABNORMAL HIGH (ref 150–450)
RED BLOOD CELL COUNT: 4.37 10*12/L (ref 3.95–5.13)
RED CELL DISTRIBUTION WIDTH: 16.5 % — ABNORMAL HIGH (ref 12.2–15.2)
WBC ADJUSTED: 9.9 10*9/L (ref 3.6–11.2)

## 2024-02-23 LAB — CYSTATIN C
CYSTATIN C: 1.68 mg/L — ABNORMAL HIGH (ref 0.64–1.23)
EGFR CKD-EPI (2012) CYSTATIN C FEMALE: 33 mL/min/{1.73_m2} — ABNORMAL LOW (ref >=60)

## 2024-02-23 LAB — MAGNESIUM: MAGNESIUM: 2.4 mg/dL (ref 1.6–2.6)

## 2024-02-23 MED ORDER — OXYCODONE ER 10 MG TABLET,CRUSH RESISTANT,EXTENDED RELEASE 12 HR
ORAL_TABLET | Freq: Two times a day (BID) | ORAL | 0 refills | 30.00000 days | Status: CP
Start: 2024-02-23 — End: 2024-03-24

## 2024-02-23 MED ADMIN — bisacodyl (DULCOLAX) suppository 10 mg: 10 mg | RECTAL | @ 19:00:00

## 2024-02-23 MED ADMIN — acetaminophen (TYLENOL) tablet 1,000 mg: 1000 mg | ORAL | @ 02:00:00

## 2024-02-23 MED ADMIN — amlodipine (NORVASC) tablet 10 mg: 10 mg | ORAL | @ 14:00:00

## 2024-02-23 MED ADMIN — lidocaine (ASPERCREME) 4 % 1 patch: 1 | TRANSDERMAL | @ 12:00:00

## 2024-02-23 MED ADMIN — oxyCODONE (ROXICODONE) immediate release tablet 5 mg: 5 mg | ORAL | @ 09:00:00 | Stop: 2024-02-23

## 2024-02-23 MED ADMIN — magnesium citrate solution 296 mL: 296 mL | ORAL | @ 20:00:00 | Stop: 2024-02-23

## 2024-02-23 MED ADMIN — oxyCODONE (ROXICODONE) immediate release tablet 5 mg: 5 mg | ORAL | @ 12:00:00 | Stop: 2024-02-23

## 2024-02-23 MED ADMIN — senna (SENOKOT) tablet 1 tablet: 1 | ORAL | @ 02:00:00

## 2024-02-23 MED ADMIN — polyethylene glycol (MIRALAX) packet 17 g: 17 g | ORAL | @ 02:00:00

## 2024-02-23 MED ADMIN — HYDROmorphone (DILAUDID) injection Syrg 0.5 mg: .5 mg | INTRAVENOUS | @ 06:00:00 | Stop: 2024-02-23

## 2024-02-23 MED ADMIN — spironolactone (ALDACTONE) tablet 25 mg: 25 mg | ORAL | @ 14:00:00

## 2024-02-23 MED ADMIN — acetaminophen (TYLENOL) tablet 1,000 mg: 1000 mg | ORAL | @ 09:00:00

## 2024-02-23 MED ADMIN — SMOG ENEMA: 240 mL | RECTAL | @ 10:00:00 | Stop: 2024-02-23

## 2024-02-23 MED ADMIN — ondansetron (ZOFRAN) injection 4 mg: 4 mg | INTRAVENOUS | @ 22:00:00

## 2024-02-23 MED ADMIN — HYDROmorphone (DILAUDID) injection Syrg 0.5 mg: .5 mg | INTRAVENOUS | @ 12:00:00 | Stop: 2024-02-23

## 2024-02-23 MED ADMIN — oxyCODONE (ROXICODONE) immediate release tablet 15 mg: 15 mg | ORAL | @ 15:00:00 | Stop: 2024-02-23

## 2024-02-23 MED ADMIN — lidocaine (ASPERCREME) 4 % 1 patch: 1 | TRANSDERMAL | @ 02:00:00

## 2024-02-23 NOTE — Unmapped (Signed)
 Internal Medicine (MEDL) Progress Note    Assessment & Plan:   Kayla Knight is a 83 y.o. 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    Principal Problem:    Uncontrolled pain  Active Problems:    Benign essential hypertension    Renal artery stenosis, native, bilateral (CMS-HCC)    Hyperlipidemia, unspecified    Nonrheumatic aortic valve insufficiency    S/P cataract extraction and insertion of intraocular lens, left    Malignant neoplasm of upper lobe of right lung        Active Problems    Uncontrolled Abdominal Pain - RUL Mass- Adrenal Mass- Iliac and paraspinal lesions c/f malignancy   Pt evaluated by undiagnosed cancer clinic on 02/01/24 with Hematology/Oncology for concerning RUL mass. Pt had recent 40 lb weight loss and night sweats over the past 1-2 months. PET Scan 5/15 showed centrally located right upper lobe lung mass and hypermetabolic adrenal glands, R mediastinal/subcarinal lymph nodes, lytic lesions, soft tissues of the right common iliac, and left paraspinal region above L5, and in the left gluteus maximus muscle, suspicious for malignancy. PET also showed a small right pleural effusion. Some of her pain regions seem to correlate with hypermetabolic regions from PET while some of her other pain seems related to constipation. She was previously well controlled with 5mg  of oxycodone q6 hours but pain load has increased over the last 3 weeks with a functional decline. The day prior to admission, she required 10-15 mg oxycodone q4-6h for pain management. Due to inadequate pain control, outpatient IP clinic sent her to [facility] for better pain management and expedited workup for malignancy. In the ED, she received 1 mg Dauladid with adequate pain control. Patient hemodynamically stable   - VIR consulted for biopsy, appreciate recs:   - They suspect that a vertebral lesion is the most feasible extrathoracic site to biopsy (no convincing adrenal lesion to biopsy), however they recommended IP consult to see if endobronchial biopsy is feasible  - Interventional pulmonology consulted; appreciate recs:   - Plan for endobronchial biopsy 6/4, which can happen as outpatient as well  - Pain:   - Acetaminophen 1000 mg q8  - Scheduled lidocaine patch  - Oxycodone 5mg  q4 PRN moderate pain   - Dilaudid 0.5 mg q4 PRN severe pain   - Will pursue palliative care consult for further pain management recs, they will likely see patient tomorrow (5/29)  - IV Zofran 4 mg Q8h PRN   - IV Compazine 5 mg PRN  - Nutrition consult   - PT/OT    Opioid-induced constipation  Patient with worsening constipation and abdominal pain in the last couple weeks since starting opioids. Suspect that this is a contributor to patient's acute pain symptoms. At home, she was getting Miralax BID and mag citrate BID per daughter, with more regular BM and improved pain. She received smog enema in the ED with immediate improvement of her pain. KUB reveals gas-distended loops of small and large bowel with mild colonic stool burden. No evidence of obstruction.  - Miralax 17 g BID  - Senna nightly for constipation   - 1x mag citrate dose this PM, consider  scheduling to match home regimen  - Repeat CT A/P w contrast given abdominal distention on exam and recent diverticulitis, although low suspicion for acute intra-abdominal pathology    Resistant HTN - Hx of Renal Artery Stenosis with stent to R and L renal artery  Pt takes amlodipine and spironolactone for refractory HTN s/p bilateral renal artery stent placement. BP on admission is elevated 165/72 likely secondary to uncontrolled pain. Last dose of plavix 5/27 in AM.   - Amlodipine 10 mg daily   - Spironolactone 25 mg daily   - Hold Plavix 75 mg prior to procedure   - Hold Lovenox DVT prior to procedure   - BMP daily      UTI?  Pt was discharged from the ED on 5/23 after a fall at home. She was found to have a urine cultures growing S. Agalactiae. She hit her head during the fall but imaging negative and denies any headache, dizziness, or vision changes. Endorses some nausea in association with pain medication. She has generalized abd pain. She was unable to pick up her amoxicillin abx bc the orders did not go through. Denies dysuria, urianry frequency, hematuria. No fever or chills. Repeat UA with moderate leuk esterase and 14 WBC.   - Follow up urine culture  - Will defer treatment given no reported dysuria, frequency     Chronic Problems  T2DM  Well controlled w/o medications. Most recent A1c 5.7 on 10/22  - SSI      HLD  Continue home evolocumab     Diverticulitis  Recent colonoscopy on 02/11/2024 showed 6 mm polyp, diverticulosis without malignancy, active bleeding.   - CTM      Asthma   - Albuterol Q6h PRN       The patient's presentation is complicated by the following clinically significant conditions requiring additional evaluation and treatment: - Age related debility POA requiring additional resources: DME, PT, or OT  - Metastatic cancer POA requiring further investigation, treatment, or monitoring    Issues Impacting Complexity of Management:  -High risk of complications from pain and/or analgesia likely to result in delirium      Medical Decision Making: Discussed the patient's management and/or test interpretation with Pulmonology and interventional radiology as summarized within this note      Daily Checklist:  Diet: Regular Diet  DVT PPx: Lovenox 40mg  q24h  Electrolytes: Replete Potassium to >/= 3.6 and Magnesium to >/= 1.8  Code Status: DNR and DNI  Dispo: Continue floor care    Team Contact Information:   Primary Team: Internal Medicine (MEDL)  Primary Resident: Dorthea Gauze, MD  Resident's Pager: [phone] (Gen MedL Intern - Tower)    Interval History:   No acute events overnight. Patient reports persistent pain this morning, most notably in her lower abdomen. She states that the enema she received in the ED relieved her pain temporarily. She has not had a BM since receiving the enema. Otherwise denies any acute concerns.    All other systems were reviewed and are negative except as noted in the HPI    Objective:   Temp:  [36.4 ??C (97.6 ??F)-36.6 ??C (97.9 ??F)] 36.6 ??C (97.9 ??F)  Pulse:  [60-80] 60  SpO2 Pulse:  [60-89] 78  Resp:  [16] 16  BP: (112-172)/(67-86) 166/73  SpO2:  [89 %-97 %] 91 %    Gen: NAD, appears chronically ill  HENT: atraumatic, normocephalic  Heart: RRR  Lungs: CTAB, no crackles or wheezes  Abdomen: soft but distended  with mild diffuse tenderness to palpation  Extremities: No edema    Mining engineer, MS4    I attest that I have reviewed the medical student note and that the components of the history of the present illness, the physical exam, and the assessment and plan documented were performed by me or were performed in my presence by the student where I verified the documentation and performed (or re-performed) the exam and medical decision making.     Jabier Martens, MD  PGY-1 Internal Medicine

## 2024-02-23 NOTE — Unmapped (Addendum)
 Adult Nutrition Assessment Note    Visit Type: MD Consult  Reason for Visit: Assessment (Nutrition)    NUTRITION INTERVENTIONS and RECOMMENDATION     Continue Regular Diet as tolerated  Provided a case of 4 chocolate ensure plant based at bedside   Please notify RD if patient would like to receive more  Consider daily multivitamin   Please weigh patient 1-2x/week     NUTRITION ASSESSMENT     Current  nutrition therapy is appropriate although not meeting nutritional  needs at this time due to poor appetite  Patient would benefit from start of oral supplement to better meet nutritional needs.    NUTRITIONALLY RELEVANT DATA     HPI & PMH:   Kayla Knight is a 83 y.o. female whose presentation is complicated by T2DM, Resistant HTN, Hix of renal artery stenosis s/p sten L renal artery (2021) and R renal artery (2023), HLD, hx diverticulitis, persistent lower abd pain,  PET 02/10/2024 new diagnosed RUL mass with associated mediastinal and subcarinal lymphadenopathy as well as lytic lesion and R pleural effusion concerning for metastatic disease presenting for uncontrolled pain and nausea vomiting in need of expedited cancer workup    Nutrition History: Spoke with patient and her daughter during visit today. Reports decreased PO intakes over the past 4 months PTA, initially related to altered temporary liquid diet after diverticulitis flair, then after advancement to regular textures, PO intakes limited by appetite, constipation, and pain. Per daughter intakes were better when family prepared meals, but patient was having difficulty preparing meals for herself due to pain. She follows a mostly plant based diet. She has not tried protein drinks in the past, but was interested in trying ensure plant based to help meet nutritional needs. Last BM PTA      Medications:  Nutritionally pertinent medications reviewed and evaluated for potential food and/or medication interactions and include polyethylene glycol and senna and lispro sliding scale insulin AC/HS    Labs:   Nutritionally pertinent labs reviewed.     Nutritional Needs:   Healthy balance of carbohydrate, protein, and fat.     Anthropometric Data:  Height:     Admission weight: 65.3 kg (144 lb)  Last recorded weight: 65.3 kg (144 lb)  Date of last recorded weight: 02/22/24  IBW:    BMI: Body mass index is 23.24 kg/m??.   Usual Body Weight: 163 lb  Weight Assessment: 19 lb weight loss in 6 months (11.7% severe)    Malnutrition Assessment:  Malnutrition Assessment using AND/ASPEN or GLIM Clinical Characteristics:    Severe Protein-Calorie Malnutrition in the context of chronic illness (02/23/24 1420)  Energy Intake: < or equal to 75% of estimated energy requirement for > or equal to 1 month  Interpretation of Wt. Loss: > 10% x 6 month  Fat Loss: Moderate  Muscle Loss: Severe  Malnutrition Score: 3            Nutrition Focused Physical Exam:  Nutrition Focused Physical Exam:  Fat Areas Examined  Orbital: Severe loss  Upper Arm: Moderate loss  Thoracic: Moderate loss      Muscle Areas Examined  Temple: Moderate loss  Clavicle: Severe loss  Acromion: Severe loss  Scapular: Severe loss  Dorsal Hand: Severe loss  Patellar: Severe loss  Anterior Thigh: Moderate loss  Posterior Calf: Moderate loss              Nutrition Evaluation  Overall Impressions: Moderate fat loss, Severe muscle loss (02/23/24 1419)  Care plan:  Completed    Current Nutrition:  Oral intake   Nutrition Orders            NPO Sips with meds; Procedure/Test: NPO starting at 05/29 0001    Nutrition Therapy Regular/House starting at 05/28 1336          Nutritionally Pertinent Allergies, Intolerances, Sensitivities, and/or Cultural/Religious Restrictions:  none identified at this time     GOALS and EVALUATION     Patient to meet 60% or greater of nutritional needs via combination of meals, snacks, and/or oral supplements within admit.  - New    Motivation, Barriers, and Compliance:  Evaluation of motivation, barriers, and compliance completed. No concerns identified at this time.     Discharge Planning:   Monitor for potential discharge needs with multi-disciplinary team.     Follow-Up Parameters:   1-2 times per week (and more frequent as indicated)    Robley Chow MS, RD, LD, CNSC  [phone]

## 2024-02-23 NOTE — Unmapped (Signed)
 Team reached out about scheduling bronchoscopy/EBUS/biopsy based on her imaging. VIR unable to biopsy anything but bony lesions per primary team.     Discussed with daughter via telephone who confirmed last dose of plavix 5/27. We will plan for biopsy 6/4. She should be NPO at midnight and should continue to hold plavix (needs to be off at least 5 days prior). DVT ppx is OK to continue. This can also be done on an outpatient basis should she be able to be discharged before then. Discussed with Dr. Akulian.     Jones Ness, MD   Fellow, Pulmonary and Critical Care Medicine  Pager [phone]  Feb 23, 2024

## 2024-02-23 NOTE — Unmapped (Addendum)
 Care Management  Initial Transition Planning Assessment      Patient lives with daughter at this time in [facility]) in a single level home with 3 steps to enter.  At baseline patient is dependent with ADLs and needs help getting to bathroom and with bathing and dressing and food prep. She does not receive home health services. DME is rollator, toilet seat raised, tub bench. Daughter will provide transportation and will assist with basic care at home.      Type of Residence: Mailing Address:  [address]  [address]  [place] [address]  Contacts:    Patient Phone Number: [phone] (mobile)        Medical Provider(s): Powell-Tillman, Levonne Genese, MD  Reason for Admission: Admitting Diagnosis:  RUL Mass  Past Medical History:   has a past medical history of Anemia, Cataract (2008), Diabetes mellitus, Dry eye, Hearing impairment, Heart murmur (2016), Heart valve disease, History of stent insertion of renal artery (01/29/2020), Hypertension, Neuromuscular disorder (2014), Tear of meniscus of knee, and Urinary incontinence.  Past Surgical History:   has a past surgical history that includes Knee cartilage surgery (Right, 2007, 2008); Plantar fasciectomy (Left); pr xcapsl ctrc rmvl insj io lens prosth w/o ecp (Right, 02/13/2016); Cataract extraction w/ intraocular lens implant (Left, 02/27/2016); pr xcapsl ctrc rmvl insj io lens prosth w/o ecp (Left, 02/27/2016); pr revascularize fem/pop artery,angioplasty/stent (N/A, 01/29/2020); Root canal; Wisdom tooth extraction; Knee surgery; Renal artery stent (01/11/2020); Eye surgery (01/30/16); Tubal ligation (1973); Joint replacement (Knee); pr upper gi endoscopy,biopsy (N/A, 04/01/2021); pr revascularize fem/pop artery,angioplasty/stent (Right, 12/26/2021); and pr colonoscopy flx dx w/collj spec when pfrmd (N/A, 02/11/2024).   Previous admit date: 11/12/2019    Primary Insurance- Payor: HUMANA MEDICARE ADV / Plan: HUMANA GOLD PLUS HMO / Product Type: *No Product type* /   Secondary Insurance - Secondary Insurance  MEDICAID [place]  Prescription Coverage - N/A  Preferred Pharmacy - [id number] - [place], [place] - [address]  [facility]    Transportation home: Radiographer, therapeutic / Social Worker assessed the patient by : In person interview with patient, Medical record review, Discussion with Clinical Care team  Orientation Level: Oriented X4  Functional level prior to admission: Independent  Reason for referral: Discharge Planning    Contact/Decision Maker  Extended Emergency Contact Information  Primary Emergency Contact: [facility]  Address: [address]           [place], [place] [address] [place]  Mobile Phone: [phone]  Relation: Daughter  Secondary Emergency Contact: Scarlette Currier  Address: [address]           [place], [place] [address] [place]  Mobile Phone: [phone]  Relation: Grandchild    Legal Next of Kin / Guardian / POA / Advance Directives     HCDM (HCPOA): Octavie, [place] - Daughter - [phone]    Advance Directive (Medical Treatment)  Does patient have an advance directive covering medical treatment?: Patient has advance directive covering medical treatment, copy in chart.    Health Care Decision Maker [HCDM] (Medical & Mental Health Treatment)  Healthcare Decision Maker: HCDM documented in the HCDM/Contact Info section.     Readmission Information    Have you been hospitalized in the last 30 days?: No     Did the  following happen with your discharge?       Patient Information  Lives with: Children    Type of Residence: Private residence (single level home with 3 steps to enter)      Location/Detail: [address] [place] [address]     Responsibilities/Dependents at home?: No    Home Care services in place prior to admission?: No        Equipment Currently Used at Home: other (see comments), raised toilet seat, tub bench (rollator)  Current HME Agency (Name/Phone #): unknown    Currently receiving outpatient dialysis?: No       Financial Information       Need for financial assistance?: No       Social Determinants of Health  Social Drivers of Health     Food Insecurity: No Food Insecurity (02/23/2024)    Hunger Vital Sign     Worried About Running Out of Food in the Last Year: Never true     Ran Out of Food in the Last Year: Never true   Tobacco Use: Medium Risk (02/22/2024)    Patient History     Smoking Tobacco Use: Former     Smokeless Tobacco Use: Never     Passive Exposure: Past   Transportation Needs: No Transportation Needs (02/23/2024)    PRAPARE - Therapist, art (Medical): No     Lack of Transportation (Non-Medical): No   Alcohol Use: Not At Risk (01/04/2023)    Alcohol Use     How often do you have a drink containing alcohol?: Monthly or less     How many drinks containing alcohol do you have on a typical day when you are drinking?: 1 - 2     How often do you have 5 or more drinks on one occasion?: Never   Housing: Low Risk  (02/23/2024)    Housing     Within the past 12 months, have you ever stayed: outside, in a car, in a tent, in an overnight shelter, or temporarily in someone else's home (i.e. couch-surfing)?: No     Are you worried about losing your housing?: No   Physical Activity: Sufficiently Active (01/04/2023)    Exercise Vital Sign     Days of Exercise per Week: 7 days     Minutes of Exercise per Session: 30 min   Utilities: Low Risk  (02/10/2024)    Utilities     Within the past 12 months, have you been unable to get utilities (heat, electricity) when it was really needed?: No   Stress: No Stress Concern Present (01/04/2023)    [employer] of Occupational Health - Occupational Stress Questionnaire     Feeling of Stress : Only a little   Interpersonal Safety: Not At Risk (02/22/2024)    Interpersonal Safety     Unsafe Where You Currently Live: No     Physically Hurt by Anyone: No Abused by Anyone: No   Substance Use: Not on file (01/10/2024)   Intimate Partner Violence: Not At Risk (02/22/2024)    Humiliation, Afraid, Rape, and Kick questionnaire     Fear of Current or Ex-Partner: No     Emotionally Abused: No     Physically Abused: No     Sexually Abused: No   Social Connections: Moderately Isolated (01/04/2023)    Social Connection and Isolation Panel [NHANES]     Frequency of Communication with Friends and Family: More than three times a week  Frequency of Social Gatherings with Friends and Family: More than three times a week     Attends Religious Services: Never     Database administrator or Organizations: Yes     Attends Engineer, structural: More than 4 times per year     Marital Status: Divorced   Physicist, medical Strain: Low Risk  (02/23/2024)    Overall Financial Resource Strain (CARDIA)     Difficulty of Paying Living Expenses: Not very hard   Recent Concern: Financial Resource Strain - Medium Risk (02/10/2024)    Overall Financial Resource Strain (CARDIA)     Difficulty of Paying Living Expenses: Somewhat hard   Health Literacy: Low Risk  (01/04/2023)    Health Literacy     : Never   Internet Connectivity: Internet connectivity concern unknown (02/10/2024)    Internet Connectivity     Do you have access to internet services: Yes     How do you connect to the internet: Not on file     Is your internet connection strong enough for you to watch video on your device without major problems?: Not on file     Do you have enough data to get through the month?: Not on file     Does at least one of the devices have a camera that you can use for video chat?: Not on file       Complex Discharge Information    Is patient identified as a difficult/complex discharge?: No       Discharge Needs Assessment  Concerns to be Addressed: discharge planning    Clinical Risk Factors: Functional Limitations, > 65    Barriers to taking medications: No    Prior overnight hospital stay or ED visit in last 90 days: Yes         Patient's Choice of Community Agency(s): no preference    Anticipated Changes Related to Illness: other (see comments) (Likely need time to recover before resuming usual activities.)    Equipment Needed After Discharge: other (see comments) (Cm will for follow for  any DME needs)    Discharge Facility/Level of Care Needs: other (see comments) (CM will follow for DC needs.)    Readmission  Risk of Unplanned Readmission Score: UNPLANNED READMISSION SCORE: 23.73%  Predictive Model Details          24% (High)  Factor Value    Calculated 02/23/2024 16:06 29% Number of ED visits in last six months 8    [facility] Risk of Unplanned Readmission Model 18% Number of active inpatient medication orders 40     13% Active NSAID inpatient medication order present     7% Diagnosis of cancer present     7% Charlson Comorbidity Index 9     6% ECG/EKG order present in last 6 months     5% Diagnosis of electrolyte disorder present     4% Imaging order present in last 6 months     4% Age 83     4% Latest hemoglobin low (10.1 g/dL)     3% Active anticoagulant inpatient medication order present     1% Future appointment scheduled     1% Current length of stay 0.816 days      Readmitted Within the Last 30 Days? (No if blank)   Patient at risk for readmission?: No    Discharge Plan  Screen findings are: Care Manager reviewed the plan of the patient's care with the Multidisciplinary Team. No discharge planning  needs identified at this time. Care Manager will continue to manage plan and monitor patient's progress with the team.    Expected Discharge Date: 02/25/2024    Expected Transfer from Critical Care:         Patient and/or family were provided with choice of facilities / services that are available and appropriate to meet post hospital care needs?: Other (Comment) (Choice of facilities/services will be provided if deemed medically necessary.)       Initial Assessment complete?: Yes

## 2024-02-24 LAB — BASIC METABOLIC PANEL
ANION GAP: 12 mmol/L (ref 5–14)
BLOOD UREA NITROGEN: 10 mg/dL (ref 9–23)
BUN / CREAT RATIO: 12
CALCIUM: 9.8 mg/dL (ref 8.7–10.4)
CHLORIDE: 100 mmol/L (ref 98–107)
CO2: 26 mmol/L (ref 20.0–31.0)
CREATININE: 0.81 mg/dL (ref 0.55–1.02)
EGFR CKD-EPI (2021) FEMALE: 73 mL/min/1.73m2 (ref >=60)
GLUCOSE RANDOM: 113 mg/dL (ref 70–179)
POTASSIUM: 3.8 mmol/L (ref 3.4–4.8)
SODIUM: 138 mmol/L (ref 135–145)

## 2024-02-24 LAB — CBC W/ AUTO DIFF
BASOPHILS ABSOLUTE COUNT: 0.1 10*9/L (ref 0.0–0.1)
BASOPHILS RELATIVE PERCENT: 0.7 %
EOSINOPHILS ABSOLUTE COUNT: 0.1 10*9/L (ref 0.0–0.5)
EOSINOPHILS RELATIVE PERCENT: 0.7 %
HEMATOCRIT: 31.4 % — ABNORMAL LOW (ref 34.0–44.0)
HEMOGLOBIN: 10.5 g/dL — ABNORMAL LOW (ref 11.3–14.9)
LYMPHOCYTES ABSOLUTE COUNT: 1.5 10*9/L (ref 1.1–3.6)
LYMPHOCYTES RELATIVE PERCENT: 15.7 %
MEAN CORPUSCULAR HEMOGLOBIN CONC: 33.4 g/dL (ref 32.0–36.0)
MEAN CORPUSCULAR HEMOGLOBIN: 23.5 pg — ABNORMAL LOW (ref 25.9–32.4)
MEAN CORPUSCULAR VOLUME: 70.4 fL — ABNORMAL LOW (ref 77.6–95.7)
MEAN PLATELET VOLUME: 6.6 fL — ABNORMAL LOW (ref 6.8–10.7)
MONOCYTES ABSOLUTE COUNT: 0.9 10*9/L — ABNORMAL HIGH (ref 0.3–0.8)
MONOCYTES RELATIVE PERCENT: 8.9 %
NEUTROPHILS ABSOLUTE COUNT: 7.2 10*9/L (ref 1.8–7.8)
NEUTROPHILS RELATIVE PERCENT: 74 %
PLATELET COUNT: 718 10*9/L — ABNORMAL HIGH (ref 150–450)
RED BLOOD CELL COUNT: 4.47 10*12/L (ref 3.95–5.13)
RED CELL DISTRIBUTION WIDTH: 16.5 % — ABNORMAL HIGH (ref 12.2–15.2)
WBC ADJUSTED: 9.8 10*9/L (ref 3.6–11.2)

## 2024-02-24 LAB — CYSTATIN C
CYSTATIN C: 1.6 mg/L — ABNORMAL HIGH (ref 0.64–1.23)
EGFR CKD-EPI (2012) CYSTATIN C FEMALE: 36 mL/min/{1.73_m2} — ABNORMAL LOW (ref >=60)

## 2024-02-24 LAB — MAGNESIUM: MAGNESIUM: 2.6 mg/dL (ref 1.6–2.6)

## 2024-02-24 MED ADMIN — lidocaine (ASPERCREME) 4 % 1 patch: 1 | TRANSDERMAL | @ 12:00:00

## 2024-02-24 MED ADMIN — senna (SENOKOT) tablet 2 tablet: 2 | ORAL | @ 01:00:00

## 2024-02-24 MED ADMIN — iohexol (OMNIPAQUE) 350 mg iodine/mL solution 100 mL: 100 mL | INTRAVENOUS | @ 14:00:00 | Stop: 2024-02-24

## 2024-02-24 MED ADMIN — DULoxetine (CYMBALTA) DR capsule 20 mg: 20 mg | ORAL | @ 20:00:00

## 2024-02-24 MED ADMIN — acetaminophen (TYLENOL) tablet 1,000 mg: 1000 mg | ORAL | @ 09:00:00

## 2024-02-24 MED ADMIN — acetaminophen (TYLENOL) tablet 1,000 mg: 1000 mg | ORAL | @ 01:00:00

## 2024-02-24 MED ADMIN — magnesium citrate solution 148 mL: 148 mL | ORAL

## 2024-02-24 MED ADMIN — multivitamins, therapeutic with minerals tablet 1 tablet: 1 | ORAL | @ 12:00:00

## 2024-02-24 MED ADMIN — enoxaparin (LOVENOX) syringe 40 mg: 40 mg | SUBCUTANEOUS

## 2024-02-24 MED ADMIN — polyethylene glycol (MIRALAX) packet 17 g: 17 g | ORAL | @ 12:00:00

## 2024-02-24 MED ADMIN — spironolactone (ALDACTONE) tablet 25 mg: 25 mg | ORAL | @ 12:00:00

## 2024-02-24 MED ADMIN — oxyCODONE (ROXICODONE) immediate release tablet 10 mg: 10 mg | ORAL | @ 16:00:00 | Stop: 2024-03-08

## 2024-02-24 MED ADMIN — magnesium citrate solution 148 mL: 148 mL | ORAL | @ 12:00:00

## 2024-02-24 MED ADMIN — lidocaine (ASPERCREME) 4 % 1 patch: 1 | TRANSDERMAL

## 2024-02-24 MED ADMIN — amlodipine (NORVASC) tablet 10 mg: 10 mg | ORAL | @ 12:00:00

## 2024-02-24 MED ADMIN — oxyCODONE (ROXICODONE) immediate release tablet 10 mg: 10 mg | ORAL | Stop: 2024-03-08

## 2024-02-24 MED ADMIN — polyethylene glycol (MIRALAX) packet 17 g: 17 g | ORAL

## 2024-02-24 NOTE — Unmapped (Signed)
 Patient is seen in consultation at the request of Princess Brooks, MD with [facility]    Assessment & Recommendations     Kayla Knight is a 83 y.o. 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    - Recommend proceeding with biopsy of a thoracic or lumbar vertebral lesion with CT guidance.  - Anticipated procedure date: 02/28/2024, pending VIR schedule  - Sedation plan: Moderate sedation  - NPO at midnight the night prior to procedure.  - This is a low bleeding risk procedure.  INR <= 3.0  Platelets >= 20  Anticoagulation does not need to be held.  - Current labs are sufficient. No new labs are needed. Current labs at acceptable levels for procedure.  - Labs to be sent: surgical pathology. Please do not decalcify the tissue per ordering provider recommendations.    This plan was discussed with Dr. Tanis Fan.    Informed Consent  Yes; this procedure has been fully reviewed with the patient. This included a discussion of the risks, benefits, and alternatives including but not limited to bleeding, infection, pain, and/or damage to surrounding structures. All questions were answered to their satisfaction, and they would like to proceed with the procedure.   --The patient will accept blood products in an emergent situation.  --The patient has a Do Not Resuscitate order in effect, which will be continued during the procedure.    Consent obtained by Aliene Ip, MD, witnessed, and scanned into patient's chart (see media tab).    Thank you for involving us  in the care of this patient. Please page the VIR consult pager [phone]) with further questions, concerns, or if new issues arise.     Subjective     History of Present Illness:  Kayla Knight is a 83 y.o. 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 Pulmonology had been consulted but would prefer plavix hold and earliest date would be 6/4 for EBUS/biopsy, thus VIR is amenable to bone biopsy.    Review of Systems: Pertinent items are noted in HPI.    Past Medical History:  Past Medical History:   Diagnosis Date    Anemia     past history, over 40 yrs ago    Cataract 2008    Surgery    Diabetes mellitus       Type II, on 9/15 patient stated she was not longer diabetic, not taking meds    Dry eye     Hearing impairment     Heart murmur 2016    [facility] Jacquiline [facility]ul [facility]    Heart valve disease     Heart mumur, pt reports diagnosis 10 yrs ago (05/10/20)    History of stent insertion of renal artery 01/29/2020    ASA indefinitely and plavix x 6 months per procedure note    Hypertension     pt reports taking meds since about 1995    Metastatic malignant neoplasm   02/22/2024    Neuromuscular disorder   2014    Feet & Legs    Tear of meniscus of knee  4.5 yrs ago    Urinary incontinence     seen a doctor about nighttime bathroom use (adressed in 2019)       Past Surgical History:  Past Surgical History:   Procedure Laterality Date    CATARACT EXTRACTION W/ INTRAOCULAR LENS IMPLANT Left 02/27/2016    EYE SURGERY  01/30/16    Cataract surgery is scheduled.    JOINT REPLACEMENT  Knee    KNEE CARTILAGE SURGERY Right 2007, 2008    x 2    KNEE SURGERY      pt reports total left knee joint replacement surgery around 2016, 2017    PLANTAR FASCIECTOMY Left     PR COLONOSCOPY FLX DX W/COLLJ SPEC WHEN PFRMD N/A 02/11/2024    Procedure: COLONOSCOPY, FLEXIBLE, PROXIMAL TO SPLENIC FLEXURE; DIAGNOSTIC, W/WO COLLECTION SPECIMEN BY BRUSH OR WASH;  Surgeon: Earl Glassing, MD;  Location: [facility];  Service: Gastroenterology    PR REVASCULARIZE FEM/POP ARTERY,ANGIOPLASTY/STENT N/A 01/29/2020    Procedure: Peripheral Angiography W Intervetion;  Surgeon: Harvie Liner, MD;  Location: [facility] CATH;  Service: Cardiology    PR REVASCULARIZE FEM/POP ARTERY,ANGIOPLASTY/STENT Right 12/26/2021    Procedure: right renal artery intervention;  Surgeon: Harvie Liner, MD;  Location: [facility] CATH;  Service: Cardiology    PR UPPER GI ENDOSCOPY,BIOPSY N/A 04/01/2021    Procedure: UGI ENDOSCOPY; WITH BIOPSY, SINGLE OR MULTIPLE;  Surgeon: Earl Glassing, MD;  Location: [facility];  Service: Gastroenterology    PR XCAPSL CTRC RMVL INSJ IO LENS PROSTH W/O ECP Right 02/13/2016    Procedure: EXTRACAPSULAR CATARACT REMOVAL W/INSERTION OF INTRAOCULAR LENS PROSTHESIS, MANUAL OR MECHANICAL TECHNIQUE;  Surgeon: Betha Brookes, MD;  Location: [facility];  Service: Ophthalmology    PR XCAPSL CTRC RMVL INSJ IO LENS PROSTH W/O ECP Left 02/27/2016    Procedure: EXTRACAPSULAR CATARACT REMOVAL W/INSERTION OF INTRAOCULAR LENS PROSTHESIS, MANUAL OR MECHANICAL TECHNIQUE;  Surgeon: Betha Brookes, MD;  [facility];  Service: Ophthalmology    RENAL ARTERY STENT  01/11/2020    ROOT CANAL      TUBAL LIGATION  1973    [facility]    WISDOM TOOTH EXTRACTION         Allergies:  Allergies   Allergen Reactions    Penicillins Itching and Swelling    Grass Pollen-June Grass Standard Other (See Comments)     Asthma attack     Lisinopril Cough    Statins-Hmg-Coa Reductase Inhibitors      Ineffective at controlling cholesterol when tried in the past.    Sulfa (Sulfonamide Antibiotics) Itching        Objective     Physical Exam:  Vitals:    02/24/24 0806   BP: 176/79   Pulse: 77   Resp: 18   Temp:    SpO2:         General: in no acute distress  Airway assessment: Class 1 - Can visualize soft palate, fauces, uvula, and tonsillar pillars    ASA 2 - Patient with mild systemic disease with no functional limitations    Pertinent Labs:  Recent Labs     02/22/24  1649 02/23/24  1037   WBC 9.9 9.9   HGB 10.2* 10.1*   HCT 31.6* 30.8*   PLT 687* 663*       Recent Labs     02/22/24  1649 02/23/24  1037   NA 136 139   K 3.8 4.5  CL 100 102   BUN 9 10   CREATININE 0.83 0.91   GLU 108 114   Estimated Creatinine Clearance: 45.4 mL/min (based on SCr of 0.91 mg/dL).     Recent Labs     02/22/24  1649   INR 1.03   APTT 28.3       Recent Labs     02/22/24  1649   PROT 7.6   ALBUMIN 3.1*   AST 25   ALT 26   ALKPHOS 172*   BILITOT 0.3   BILIDIR <0.10       Pertinent Imaging: PET/CT 02/10/2024. Imaging was reviewed by Dr. Tanis Fan.

## 2024-02-24 NOTE — Unmapped (Signed)
 CT of abd completed. Pending biopsy, possibly as outpatient. Ambulated with rollator with PT/OT.    Shift Summary   Pain increased significantly in the abdomen and back but was effectively managed with oxyCODONE, resulting in a pain score of 0.    Nutritional intake remains low, and the patient continues to experience a loss of appetite.    No hospital-acquired illnesses or injuries were noted, and precautions were deferred to promote rest.    Fall risk interventions were consistently applied, preventing any falls or injuries.    Overall, pain was effectively controlled, but nutritional intake needs improvement.     Improved Nutritional Intake: Nutritional intake remains low, as indicated by the Braden Scale score of 2, and the patient continues to experience a loss of appetite throughout the shift.     Absence of Hospital-Acquired Illness or Injury: No signs of hospital-acquired illness or injury were noted, and all precautions and restrictions were deferred to promote rest.     Absence of Fall and Fall-Related Injury: Fall risk interventions were consistently applied, including hourly visual checks and the use of safety devices for transfers, with no falls or injuries reported.     Optimal Pain Control and Function: Pain increased from a score of 2 to 6 in the abdomen and back, but after administration of oxyCODONE, pain decreased to 0 by 12:32 PM.

## 2024-02-24 NOTE — Unmapped (Signed)
 Palliative Care Consult Note    Consultation from Requesting Attending Physician:  Princess Brooks, MD  Service Requesting Consult:  [facility]  Reason for Consult Request from Attending Physician:  Evaluation of Symptoms and Goals of Care / Decision Making  Primary Care Provider:  Powell-Tillman, Levonne Genese, MD        Assessment/Plan:      SUMMARY:  This 83 y.o. 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    Symptom Assessment and Recommendations:      Uncontrolled abdominal pain - Iliac and paraspinal lesions c/f metastatic disease  Her main pain is in her abdomen but she also has some pain in her upper legs. She says that this morning when she was being transferred between beds, she had more pain. She feels like the dose of oxycodone that she is on right now is helping her. She is interested primarily in controlling her pain so that she can go home. She is currently on oxy 10-15 mg q4H with breakthrough dilaudid available. Encouraged her to use as much pain medication as she needs to over the next day to be as functional as possible. Will see how much she is requiring and come up with a plan for a long acting medication.   - Continue multimodal pain control with scheduled tylenol and lidocaine patches  - Continue oxycodone 10-15 mg q4H PRN  - Continue dilaudid q4H PRN for pain  - Will transition to long acting opioids in coming days  - Recommend starting duloxetine 20 mg daily for nerve related pain  - Continue miralax and senna for bowel regimen while on opioids  - Continue dulcolax suppository PRN    Nausea  She presented to the hospital with nausea but feels that this has been relatively well controlled. She currently has both zofran and compazine available. If she has trouble having bowel movements, would switch to compazine 1st line rather than zofran.   - Continue zofran IV q8H PRN  - Continue compazine as second line    Goals of Care and Decision Making Assessment and Recommendations:     Healthcare Decision Maker if lacks capacity:    HCDM (HCPOA): Sherisa, Gilvin - Daughter - [phone]    Advance Directive: yes; has health care POA (daughter, Lyrika Souders)    Code status:   Code Status: DNR and DNI         Goals of care as discussed on 5/29:   Briefly discussed her emotions and thoughts around possible cancer diagnosis. She said that her main goals are to go back to quilting and maintaining some of her former quality of life. Said that we would continue to discuss this over the coming days, particularly after her biopsy and options for possible treatment become clearer.    Practical, Emotional, Spiritual Support Assessment and Recommendations:  - Provided emotional support and offered spiritual support      Recommendations shared with primary team via Epic chat      Thank you for this consult. Please contact Emelda Hane page Palliative Care if there are any questions.   Palliative Care plans to visit the patient again on 5/30     Subjective:     HPI:  obtained from patient    Before she  started having abdominal pain a few months ago, she was living independently. She lives in a senior living center and makes a pound cake for her neighbors every Tuesday. She has a daughter that lives nearby. She loves to bake and cook but also loves quilting - she has been making quilts for each of her family members.    Initially when her abdominal pain started, she thought it was diverticulitis pain. When it became clear through workup that she likely has metastatic cancer, she was surprised. She does not have a family history of cancer that she knows of and did not think that she would get it. When she realized that she might not have much time left, she spent more time trying to finish her quilts. She was able to finish the one for her great-granddaughter, but she was in too much pain to finish the others.    Her main goal is to live out the rest of her life with less pain than she is currently in. She would like to go back to doing the things that she loves like baking and quilting. She is spiritual and has church support.       Symptom Severity, Assessment and Current Medication / Treatment:     Pain:  Today is better controlled than yesterday  Shortness of breath:  No  Nausea:  No, says this has been well controlled  Constipation:  No          Objective:       Function:  70% - Ambulation: Reduced / unable to do normal work, some evidence of disease / Self-Care: Full / Intake: Normal or reduced / Level of Conscious: Full    Temp:  [36.6 ??C (97.9 ??F)-37 ??C (98.6 ??F)] 36.6 ??C (97.9 ??F)  Pulse:  [75-88] 75  SpO2 Pulse:  [78-84] 84  Resp:  [16-20] 18  BP: (143-176)/(72-93) 144/75  SpO2:  [91 %-100 %] 93 %    Physical Exam:  Constitutional: In NAD, resting comfortably in bed   Eyes: anicteric sclera, no discharge  ENMT: Moist oral mucosa  Pulm: Breathing comfortably on RA   CV: RRR  Psych: mood and affect appropriate    Testing reviewed and interpreted:   None today    I personally spent 45 minutes face-to-face and non-face-to-face in the care of this patient, which includes all pre, intra, and post visit time on the date of service.  All documented time was specific to the E/M visit and does not include any procedures that may have been performed.     See ACP Note from today for additional billable service:  No.

## 2024-02-24 NOTE — Unmapped (Addendum)
 OCCUPATIONAL THERAPY  Evaluation (02/24/24 1556)    Patient Name:  Kayla Knight       Medical Record Number: [id number]     Date of Birth: 11-Jul-1941  Sex: Female      Post-Discharge Occupational Therapy Recommendations: 3x weekly, Would benefit from assistance with IADLs/ADLs   IADL/ADLS Needing Assistance: Housekeeping, Tub/shower transfers, Meal Preparation      Equipment Recommendation  OT DME Recommendations: Bedside commode, Tub Transfer Bench, Other (bed rails)       OT Treatment Diagnosis: Generalized muscle weakness         Assessment  Problem List: Pain, Decreased endurance, Decreased activity tolerance, Impaired ADLs             Assessment: Per EPIC, Hajra Port is a 83 y.o. 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  Patient seen for initial OT evaluation and occupational profile. With consideration of patient's occupational profile, assessment review, level of clinical decision making involved, and intervention plan, patient presents as a low complexity case w/ the following functional deficits: decreased activity tolerance and increased fall risk  that impact independent participation in ADLs. At baseline, pt is independent for all ADL and IADL tasks, recently in the last 3 weeks pt has had a decline and required increased assistance from her daughter. Pt currently is performing below her functional baseline and requires SBA for transfers, supervision for dressing tasks. Pt would benefit from skilled acute OT services to address above noted deficits and would recommend post-acute OT 3x/week to maximize safety and functional independence. Additionally, pt would benefit from DME of a tub transfer bench, bed rails, and a bedside commode as pt's bathroom is in her daughters room which places pt at an increased falls due to the distance and decreased space as pt is unable to use her rollator in the room.        Today's Interventions: Patient educated regarding: role of OT, OT POC, fall prevention strategies, energy conservation techniques , use of call button for staff assistance, and the role of daily participation in ADLs for maintaining/improving activity tolerance while in the hospital. Interventions including bed mobility, functional transfers, LB dressing, dynamic standing tasks to include ADL tasks, toileting tasks and transfers, use of RW and rollator for mobility. Education to pt and pt's daughter regarding discharge recommendations, equipment needs, and transfer training.      Activity Tolerance During Today's Session  Tolerated treatment well    Plan  Planned Frequency of Treatment: Plan of Care Initiated: 02/24/24  1-2x per day Weekly Frequency: 2-3 days per week  Planned Treatment Duration: 03/09/24    Planned Interventions:  Education (Patient/Family/Caregiver), Self-Care/Home Training, Therapeutic Exercise, Therapeutic Activity, Home Exercise Program      GOALS:   Patient and Family Goals: To gain strength    Short Term:   SHORT GOAL #1: Pt will complete all aspects of toileting tasks with Mod I using LRAD.   Time Frame : 2 weeks  SHORT GOAL #2: Pt will complete full body dressing including item retrieval with Mod I.   Time Frame : 2 weeks  SHORT GOAL #3: Pt will complete standing ADL tasks for >8 minutes with  Mod I using LRAD.   Time Frame : 2 weeks    Long Term Goal #1: Pt will score 20+/24 on the AM-PAC.  Time Frame: 3 weeks    Prognosis:  Good  Positive Indicators:  Motivation, family support, willingness to participate  Barriers to Discharge: None    Subjective  Medical Updates Since Last Visit/Relevant PMH Affecting Clinical Decision Making:    Prior Functional Status At baseline pt is independent with all ADLs, IADLs, and functional mobility and reports walking 5 miles a day. Recently for the past 3 weeks, she has been living with her daughter and has needed assistance for all ADL tasks. She has moved into her daughters house. Pt currently uses a rollator for mobility and has to furniture walk in pt's daughters room to get to the bathroom. She has had 2 falls in the last 6 months, 1 fall when she was playing ping pong and the 2nd fall when she fell asleep on a high stool.    Living Situation  Living Environment: House  Lives With: Family, Daughter (+grandson)  Home Living: Two level home, Able to Live on main level with bedroom/bathroom, Stairs to enter with rails, Tub/shower unit, Shower chair with back, Standard height toilet, Hand-held shower hose  Rail placement (outside): Bilateral rails in reach  Number of Stairs to Enter (outside): 3  Rail placement (inside): Bilateral rails in reach  Number of Stairs to Alternate level (inside): 3  Caregiver Identified?: Yes  Caregiver Availability: Intermittent  Caregiver Ability: Limited lifting  Caregiver Identified?: Yes (mother and grandson)  Equipment available at home: Hand-held shower hose, Rollator (raised toilet seat)    Medical Tests / Procedures: Reviewed via EPIC       Patient / Caregiver reports: Pt's daughter present and supportive throughout.      Past Medical History:   Diagnosis Date    Anemia     past history, over 40 yrs ago    Cataract 2008    Surgery    Diabetes mellitus       Type II, on 9/15 patient stated she was not longer diabetic, not taking meds    Dry eye     Hearing impairment     Heart murmur 2016    [facility] Jacquiline [facility]ul [facility]    Heart valve disease     Heart mumur, pt reports diagnosis 10 yrs ago (05/10/20)    History of stent insertion of renal artery 01/29/2020    ASA indefinitely and plavix x 6 months per procedure note Hypertension     pt reports taking meds since about 1995    Metastatic malignant neoplasm   02/22/2024    Neuromuscular disorder   2014    Feet & Legs    Tear of meniscus of knee     4.5 yrs ago    Urinary incontinence     seen a doctor about nighttime bathroom use (adressed in 2019)    Social History     Tobacco Use    Smoking status: Former     Current packs/day: 0.00     Types: Cigarettes     Quit date: 05/19/1984     Years since quitting: 39.7     Passive exposure: Past    Smokeless tobacco: Never   Substance Use Topics    Alcohol use: Yes     Comment: occassional      Past Surgical History:   Procedure Laterality Date    CATARACT EXTRACTION  W/ INTRAOCULAR LENS IMPLANT Left 02/27/2016    EYE SURGERY  01/30/16    Cataract surgery is scheduled.    JOINT REPLACEMENT  Knee    KNEE CARTILAGE SURGERY Right 2007, 2008    x 2    KNEE SURGERY      pt reports total left knee joint replacement surgery around 2016, 2017    PLANTAR FASCIECTOMY Left     PR COLONOSCOPY FLX DX W/COLLJ SPEC WHEN PFRMD N/A 02/11/2024    Procedure: COLONOSCOPY, FLEXIBLE, PROXIMAL TO SPLENIC FLEXURE; DIAGNOSTIC, W/WO COLLECTION SPECIMEN BY BRUSH OR WASH;  Surgeon: Earl Glassing, MD;  Location: [facility];  Service: Gastroenterology    PR REVASCULARIZE FEM/POP ARTERY,ANGIOPLASTY/STENT N/A 01/29/2020    Procedure: Peripheral Angiography W Intervetion;  Surgeon: Harvie Liner, MD;  Location: [facility] CATH;  Service: Cardiology    PR REVASCULARIZE FEM/POP ARTERY,ANGIOPLASTY/STENT Right 12/26/2021    Procedure: right renal artery intervention;  Surgeon: Harvie Liner, MD;  Location: [facility] CATH;  Service: Cardiology    PR UPPER GI ENDOSCOPY,BIOPSY N/A 04/01/2021    Procedure: UGI ENDOSCOPY; WITH BIOPSY, SINGLE OR MULTIPLE;  Surgeon: Earl Glassing, MD;  Location: [facility];  Service: Gastroenterology    PR XCAPSL CTRC RMVL INSJ IO LENS PROSTH W/O ECP Right 02/13/2016    Procedure: EXTRACAPSULAR CATARACT REMOVAL W/INSERTION OF INTRAOCULAR LENS PROSTHESIS, MANUAL OR MECHANICAL TECHNIQUE;  Surgeon: Betha Brookes, MD;  Location: [facility];  Service: Ophthalmology    PR XCAPSL CTRC RMVL INSJ IO LENS PROSTH W/O ECP Left 02/27/2016    Procedure: EXTRACAPSULAR CATARACT REMOVAL W/INSERTION OF INTRAOCULAR LENS PROSTHESIS, MANUAL OR MECHANICAL TECHNIQUE;  Surgeon: Betha Brookes, MD;  Location: [facility] OR [facility];  Service: Ophthalmology    RENAL ARTERY STENT  01/11/2020    ROOT CANAL      TUBAL LIGATION  1973    [facility]    WISDOM TOOTH EXTRACTION      Family History   Problem Relation Age of Onset    Heart disease Mother     Hypertension Mother     Dementia Mother     Arthritis Mother     Heart disease Father     Thyroid disease Father     Glaucoma Father     Alcohol abuse Father     Heart attack Father 38    Arthritis Father     Hypertension Father         Massive heart attack    Arthritis Sister     Glaucoma Sister     Hypertension Sister     Diabetes Sister     Diabetes Daughter     Diabetes Paternal Aunt     No Known Problems Brother     No Known Problems Maternal Aunt     No Known Problems Maternal Uncle     No Known Problems Paternal Uncle     No Known Problems Maternal Grandmother     No Known Problems Maternal Grandfather     No Known Problems Paternal Grandmother     No Known Problems Paternal Grandfather     Arthritis Sister         Noticeable in extremities    COPD Sister     Depression Sister     Hypertension Sister     Glaucoma Sister     Asthma Daughter         Ocassional    Hypertension Daughter  Miscarriages / [place] Daughter     Cancer Maternal Aunt         Died more than 70yrs ago    Early death Maternal Aunt     Diabetes Paternal Aunt         4 Aunts    Kidney disease Paternal Aunt     Amblyopia Neg Hx     Blindness Neg Hx     Cancer Neg Hx     Cataracts Neg Hx     Macular degeneration Neg Hx     Retinal detachment Neg Hx     Strabismus Neg Hx     Stroke Neg Hx Penicillins, Grass pollen-june grass standard, Lisinopril, Statins-hmg-coa reductase inhibitors, and Sulfa (sulfonamide antibiotics)     Objective Findings  Precautions / Restrictions  Falls precautions  No restrictions    Weight Bearing  Non-applicable    Required Braces or Orthoses  Non-applicable    Communication Preference  Verbal       Pain  Pt reporting 3/10 pain in lower abdomen and B hips    Equipment / Environment  Vascular access (PIV, TLC, Port-a-cath, PICC), Telemetry         Cognition   Orientation Level:  Oriented x 4   Arousal/Alertness:  Appropriate responses to stimuli   Attention Span:  Appears intact   Memory:  Appears intact   Following Commands:  Follows all commands and directions without difficulty   Safety Judgment:  Good awareness of safety precautions   Awareness of Errors and Problem Solving:  Able to problem solve independently    Vision / Hearing   Vision: Wears glasses all the time  Vision Comments: Glasses were not on at time of evaluation, able to read OT's name badge from ~8 feet away      Hearing: Has hearing aids but not present at time of evaluation.     Hand Function:  Right Hand Function: Right hand grip strength, ROM and coordination WNL  Left Hand Function: Left hand grip strength, ROM and coordination WNL  Hand Dominance: Ambidextrous    Skin Inspection:  Skin Inspection: Intact where visualized    Face/Cervical ROM:       ROM / Strength:  UE ROM/Strength: Left WFL, Right WFL  LE ROM/Strength: Right WFL, Left WFL    Coordination:  Coordination: WFL    Sensation:  RUE Sensation: RUE intact  LUE Sensation: LUE intact  RLE Sensation: RLE intact  LLE Sensation: LLE intact  Sensory/ Proprioception/ Stereognosis comments: denies n/t    Balance:  Animal nutritionist of Assistance: Independent  Dynamic Sitting-Level of Assistance: Archivist Standing-Level of Assistance: Supervision  Dynamic Standing - Level of Assistance: Stand by assistance  Standing Balance comments: using RW and then progressing to rollator for stability    Functional Mobility  Transfers: Standby assist  Bed Mobility - Needs Assistance: Standby assist  Ambulation: Pt performed functional mobility in room and in hallway using RW and rollator with SBA.    ADLs  ADLs: Needs assistance with ADLs  ADLs - Needs Assistance: Feeding, Grooming, Bathing, Toileting, UB dressing, LB dressing  Feeding - Needs Assistance: Set Up Assist  Grooming - Needs Assistance: Supervision  Bathing - Needs Assistance: Supervision  Toileting - Needs Assistance: Supervision  UB Dressing - Needs Assistance: Set Up Assist  LB Dressing - Needs Assistance: Supervision  IADLs: NT    Vitals / Orthostatics  Vitals/Orthostatics: NAD    Patient at end of session: All  needs in reach, Alarm activated, Friends/Family present, In bed, Lines intact, Notified Nurse     Occupational Therapy Session Duration  OT Individual [mins]: 10  OT Co-Treatment [mins]: 48 (with Gweneth Lenis, PT.)  Reason for Co-treatment: To safely progress mobility       AM-PAC-Daily Activity  Lower Body Dressing assistance needs: A Little - Minimal/Contact Guard Assist/Supervision  Bathing assistance needs: A Little - Minimal/Contact Guard Assist/Supervision  Toileting assistance needs: A Little - Minimal/Contact Guard Assist/Supervision  Upper Body Dressing assistance needs: A Little - Minimal/Contact Guard Assist/Supervision  Personal Grooming assistance needs: A Little - Minimal/Contact Guard Assist/Supervision  Eating Meals assistance needs: None - Modified Independent/Independent    Daily Activity Score: 19    Score (in points): % of Functional Impairment, Limitation, Restriction  6: 100% impaired, limited, restricted  7-8: At least 80%, but less than 100% impaired, limited restricted  9-13: At least 60%, but less than 80% impaired, limited restricted  14-19: At least 40%, but less than 60% impaired, limited restricted  20-22: At least 20%, but less than 40% impaired, limited restricted  23: At least 1%, but less than 20% impaired, limited restricted  24: 0% impaired, limited restricted      I attest that I have reviewed the above information.  Signed: Benedict Brain, OT  Filed 02/24/2024

## 2024-02-24 NOTE — Unmapped (Signed)
 Shift Summary   Pain management was addressed with the administration of enoxaparin, senna, and lidocaine, although diclofenac sodium was refused.    Pain level was recorded at 4, indicating some level of discomfort.    Nutrition screen revealed recent unintentional weight loss and decreased appetite, which may require further attention.    Patient maintained independence in feeding herself, with no changes in her ability to perform this activity.    Overall, nutritional intake remained stable, but recent weight loss and decreased appetite are concerns.   PT remained NPO since midnight for procedure.    Improved Nutritional Intake: Nutritional intake remained stable as indicated by consistent Braden Scale nutrition scores and the ability to feed herself independently throughout the shift. However, there is a concern about recent unintentional weight loss and decreased appetite, which may need further evaluation.

## 2024-02-24 NOTE — Unmapped (Signed)
 Internal Medicine (MEDL) Progress Note    Assessment & Plan:   Kayla Knight is a 83 y.o. 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    Principal Problem:    Uncontrolled pain  Active Problems:    Benign essential hypertension    Renal artery stenosis, native, bilateral (CMS-HCC)    Hyperlipidemia, unspecified    Nonrheumatic aortic valve insufficiency    S/P cataract extraction and insertion of intraocular lens, left    Malignant neoplasm of upper lobe of right lung      Drug-induced constipation      Active Problems  Uncontrolled Abdominal Pain - RUL Mass- Adrenal Mass- Iliac and paraspinal lesions c/f malignancy   Pt evaluated by undiagnosed cancer clinic on 02/01/24 with Hematology/Oncology for concerning RUL mass. Pt had recent 40 lb weight loss and night sweats over the past 1-2 months. PET Scan 5/15 showed centrally located right upper lobe lung mass and hypermetabolic adrenal glands, R mediastinal/subcarinal lymph nodes, lytic lesions, soft tissues of the right common iliac, and left paraspinal region above L5, and in the left gluteus maximus muscle, suspicious for malignancy. PET also showed a small right pleural effusion. Some of her pain regions seem to correlate with hypermetabolic regions from PET while some of her other pain seems related to constipation. She was previously well controlled with 5mg  of oxycodone q6 hours but pain load has increased over the last 3 weeks with a functional decline. The day prior to admission, she required 10-15 mg oxycodone q4-6h for pain management. Due to inadequate pain control, outpatient IP clinic sent her to [facility] for better pain management and expedited workup for malignancy. In the ED, she received 1 mg Dauladid with adequate pain control. Patient hemodynamically stable. Discussed patient with oncology team, who was already aware of the patient: they believe that staying inpatient for further workup is more appropriate as they have high suspicion for SCLC, which would require initiation of chemotherapy inpatient on MED-O.  - VIR consulted for biopsy, appreciate recs:   - Plan for vertebral bone biopsy, earliest 6/2  - Interventional pulmonology consulted; appreciate recs:   - Tentative plan for endobronchial biopsy 6/4, although will defer this if able to get the bone biopsy sooner  - Palliative care consulted, appreciate recs   - Recommend duloxetine 20 mg daily for neuropathic pain  - Pain:   - Acetaminophen 1000 mg q8  - Scheduled lidocaine patch  - Oxycodone 5mg  q4 PRN moderate pain   - Dilaudid 0.5 mg q4 PRN severe pain   - IV Zofran 4 mg Q8h PRN   - IV Compazine 5 mg PRN  - Nutrition consult   - PT/OT    Opioid-induced constipation  Patient with worsening constipation and abdominal pain in the last couple weeks since starting opioids. Suspect that this is a contributor to patient's acute pain symptoms. At home, she was getting Miralax BID and mag citrate BID per daughter, with more regular BM and improved pain. She received smog enema in the ED with immediate improvement of her pain. KUB reveals gas-distended loops of small and large bowel with mild colonic stool burden. No evidence of obstruction. Patient's  constipation and abdominal pain improving with resumption of home bowel regimen as below.  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 BP on admission is elevated 165/72 likely secondary to uncontrolled pain. Last dose of plavix 5/27 in AM.   - Amlodipine 10 mg daily   - Spironolactone 25 mg daily   - Hold Plavix 75 mg prior to procedure   - BMP daily      Chronic Problems  T2DM  Well controlled w/o medications. Most recent A1c 5.7 on 10/22.  - CTM    HLD  Continue home evolocumab     Diverticulitis  Recent colonoscopy on 02/11/2024 showed 6 mm polyp, diverticulosis without malignancy, active bleeding.   - CTM      Asthma   - Albuterol Q6h PRN       The patient's presentation is complicated by the following clinically significant conditions requiring additional evaluation and treatment: - Age related debility POA requiring additional resources: DME, PT, or OT  - Metastatic cancer POA requiring further investigation, treatment, or monitoring    Issues Impacting Complexity of Management:  -High risk of complications from pain and/or analgesia likely to result in delirium      Medical Decision Making: Discussed the patient's management and/or test interpretation with Pulmonology and interventional radiology as summarized within this note      Daily Checklist:  Diet: Regular Diet  DVT PPx: Lovenox 40mg  q24h  Electrolytes: Replete Potassium to >/= 3.6 and Magnesium to >/= 1.8  Code Status: DNR and DNI  Dispo: Continue floor care    Team Contact Information:   Primary Team: Internal Medicine (MEDL)  Primary Resident: Georjean Kite, MD, MD  Resident's Pager: [phone] (Gen MedL Intern - Tower)    Interval History:   No acute events overnight. Patient resting comfortably in bed this AM. Reports that she had improvement in her abdominal pain after several bowel movements overnight. Required minimal PRN oxycodone. Of note, majority of cancer related pain seems provoked by movement so will see how she does with PT/OT.     All other systems were reviewed and are negative except as noted in the HPI    Objective:   Temp:  [36.6 ??C (97.9 ??F)-37 ??C (98.6 ??F)] 36.6 ??C (97.9 ??F)  Pulse:  [75-88] 75  SpO2 Pulse:  [78-84] 84  Resp:  [16-20] 18  BP: (143-176)/(72-93) 144/75  SpO2:  [91 %-100 %] 93 %    Gen: NAD, appears chronically ill  HENT: atraumatic, normocephalic  Heart: RRR  Lungs: CTAB, no crackles or wheezes  Abdomen: soft, non-tender and non-distended  Extremities: No edema    [employer] copy, MS4    I attest that I have reviewed the medical student note and that the components of the history of the present illness, the physical exam, and the assessment and plan documented were performed by me or were performed in my presence by the student where I verified the documentation and performed (or re-performed) the exam and medical decision making.     Jabier Martens, MD  PGY-1 Internal Medicine

## 2024-02-24 NOTE — Unmapped (Signed)
 Oncology Consult Note    Requesting Attending Physician :  Princess Brooks, MD  Service Requesting Consult : [facility]  Reason for Consult: Cancer of undetermined primary  Primary Oncologist: Dr. Rozetta Corns (undifferentiated clinic)    Assessment: Kayla Knight is a 83 y.o. female with who was admitted for abdominal pain, nausea/vomiting, and constipation. Oncology was consulted for evaluation of cancer of undetermined primary.     Discussed with patient and [name] at bedside. Discussed our suspicion of a primary lung cancer, though at this still requires a tissue diagnosis to confirm; moreover, the treatment of lung cancer can vary greatly depending on the specific pathology (i.e., small cell versus non-small cell lung cancer pathology).  Discussed that in some instances, when we are concerned about a diagnosis of small cell lung cancer, we may recommend remaining in patients until diagnosis is confirmed in order to initiate chemotherapy and possibly avoid treatment delays; this is because small cell lung cancer is particularly aggressive and is notably very chemotherapy sensitive.    Patient's daughter indicates that their goals are to help resolve her pain, and constipation, to help her get stronger, to understand what the diagnosis of cancer is with a biopsy, and to follow-up in clinic to discuss treatment options.  They both indicated understanding that this is very likely a metastatic cancer and treatments that are offered would most likely be palliative but not curative with the goal of controlling disease and extending time and balance with quality of life from a cancer and treatment perspective. On discussion with the patient alone, she states that she does not want to proceed with the biopsy to understand her cancer diagnosis, though she does explain this is primarily motivated by her family and her desire that her family understands what the cancer is and feels that she has done everything that she can.  She is not completely against the idea of treatment if she can preserve or improve her quality of life, however indicates with no uncertainty that she is not interested in remaining hospitalized to undergo treatments, and would rather minimize the time that she needs to remain hospitalized if possible.  I advised her that any care or procedures that are done would only be done with her consent, and that at any time if she were to change her mind and withdraw consent for any procedures imaging or treatments, then this would be respected, and we would not have her undergo anything that she does not give her okay to.  Patient expressed understanding and assent to this.    Recommendations:   - Please obtain MRI brain to complete staging for likely metastatic lung primary (last MRI brain was from 10/2023)  - Based on conversation with patient, she would not want to be kept inpatient specifically for biopsy and for chemotherapy treatment if indicated, if she was otherwise medically ready for discharge.  - On discussion with inpatient team, currently discussing with IP to obtain endobronchial biopsy currently scheduled 6/4, as well as with IR regarding potential right iliac bone lesion biopsy, likely to take place sometime next week, Monday 6/2 at the earliest.  Advised that we ensure that a more invasive endobronchial biopsy is in line with patient's goals of care; otherwise, if in line with goals of care, would pursue whichever biopsy is earliest for tissue diagnosis (she does not need both biopsies, only one)  - OF NOTE: If bone biopsy is pursued, it is imperative that tissue is not decalcified  to preserve its quality for subsequent NGS sequencing  - I will inform undifferentiated clinic coordinator, Audery Lean, regarding plans for biopsy next week as an outpatient to help follow-up on pathology results and coordinate clinic follow-up    This patient has been staffed with Dr. Jules Oar. These recommendations were discussed with the primary team.     Please contact the oncology consult fellow at [phone] with any further questions.    Terrance Ferretti, MD  Oncology Fellow    -------------------------------------------------------------    HPI: Kayla Knight is a 83 y.o. female and who is being seen at the request of Princess Brooks, MD for evaluation of cancer of undetermined primary.    Patient had a significant decline in functional status over the last 1-2 months, reports that baseline functional status is normally being able to quilts, cook, and walk daily around 5 miles per day; however since a little over a month ago, patient has had a significant decline and is now still able to walk but for a significantly shorter distance, and has difficulty with ADLs including eating and cooking.  Has also experienced significant weight loss.  Seen by Dr. Rozetta Corns enough and differentiated cancer clinic on 02/01/2024 in the setting of CT scan demonstrating multiple lytic lesions in the spine, ribs, and pelvis, with 0.9 cm right lung nodule, and worsened lower abdominal pain.     PET/CT obtained 5/15 which showed a centrally located right upper lobe lung mass worrisome for primary lung disease, in addition to unchanged 0.8 cm right lower lobe nodule and 0.8 cm right upper lobe nodules; multiple hypermetabolic mediastinal lymph nodes; hypermetabolic subcentimeter nodes/soft tissue nodules in the pelvis; mild/low hypermetabolic activity of the bilateral adrenal glands with thickening; and numerous hypermetabolic lytic lesions throughout the appendicular and axial skeleton's involving several vertebral bodies, bilateral iliac wings, sacrum, bilateral proximal femurs, left scapula.  Pain had been managed with oxycodone at home but had been causing constipation which unfortunately increased her pain.  Referred to interventional pulmonary clinic and was seen 5/27 for evaluation of intervention for biopsy and diagnosis of metastatic cancer.  At the visit, pain was very poorly controlled despite doubling or tripling her home oxycodone doses, and given significant decline and worsening pain was sent to the ED for admission for pain control and IR consultation for biopsy of a nonpulmonary site of disease.    Since admission, patient's pain has been better controlled. Palliative care was consulted to assist with symptom control and to help with goals of care.  Additional CT A/P obtained which noted no findings of bowel obstruction or acute inflammation, no likely primary malignancy noted within the abdomen or pelvis, revisualized multiple lytic lesions.  Notably did not visualize an FDG avid focus in the left buttock from prior PET.  Primary team has discussed with IR and IP regarding obtaining a biopsy for tissue diagnosis of malignancy. IR would pursue biopsy of highly avid right iliac wing bony lesion; IP considering endobronchial biopsy of mediastinal lymph node or hilar mass, the latter of which is currently scheduled for 6/4.    Review of Systems: All positive and pertinent negatives are noted in the HPI; a 10 system review of systems was otherwise negative except as noted in HPI.    Oncologic History:  Hematology/Oncology History    No history exists.       Past Medical History:   Diagnosis Date    Anemia     past history, over 40 yrs ago  Cataract 2008    Surgery    Diabetes mellitus       Type II, on 9/15 patient stated she was not longer diabetic, not taking meds    Dry eye     Hearing impairment     Heart murmur 2016    [facility] Jacquiline [facility]ul [facility]    Heart valve disease     Heart mumur, pt reports diagnosis 10 yrs ago (05/10/20)    History of stent insertion of renal artery 01/29/2020    ASA indefinitely and plavix x 6 months per procedure note    Hypertension     pt reports taking meds since about 1995    Metastatic malignant neoplasm   02/22/2024    Neuromuscular disorder   2014    Feet & Legs    Tear of meniscus of knee 4.5 yrs ago    Urinary incontinence     seen a doctor about nighttime bathroom use (adressed in 2019)       Past Surgical History:   Procedure Laterality Date    CATARACT EXTRACTION W/ INTRAOCULAR LENS IMPLANT Left 02/27/2016    EYE SURGERY  01/30/16    Cataract surgery is scheduled.    JOINT REPLACEMENT  Knee    KNEE CARTILAGE SURGERY Right 2007, 2008    x 2    KNEE SURGERY      pt reports total left knee joint replacement surgery around 2016, 2017    PLANTAR FASCIECTOMY Left     PR COLONOSCOPY FLX DX W/COLLJ SPEC WHEN PFRMD N/A 02/11/2024    Procedure: COLONOSCOPY, FLEXIBLE, PROXIMAL TO SPLENIC FLEXURE; DIAGNOSTIC, W/WO COLLECTION SPECIMEN BY BRUSH OR WASH;  Surgeon: Earl Glassing, MD;  Location: [facility];  Service: Gastroenterology    PR REVASCULARIZE FEM/POP ARTERY,ANGIOPLASTY/STENT N/A 01/29/2020    Procedure: Peripheral Angiography W Intervetion;  Surgeon: Harvie Liner, MD;  Location: [facility] C[facility];  Service: Cardiology    PR REVASCULARIZE FEM/POP ARTERY,ANGIOPLASTY/STENT Right 12/26/2021    Procedure: right renal artery intervention;  Surgeon: Harvie Liner, MD;  Location: [facility] CATH;  Service: Cardiology    PR UPPER GI ENDOSCOPY,BIOPSY N/A 04/01/2021    Procedure: UGI ENDOSCOPY; WITH BIOPSY, SINGLE OR MULTIPLE;  Surgeon: Earl Glassing, MD;  Location: [facility];  Service: Gastroenterology    PR XCAPSL CTRC RMVL INSJ IO LENS PROSTH W/O ECP Right 02/13/2016    Procedure: EXTRACAPSULAR CATARACT REMOVAL W/INSERTION OF INTRAOCULAR LENS PROSTHESIS, MANUAL OR MECHANICAL TECHNIQUE;  Surgeon: Betha Brookes, MD;  [facility] OR [facility];  Service: Ophthalmology    PR XCAPSL CTRC RMVL INSJ IO LENS PROSTH W/O ECP Left 02/27/2016    Procedure: EXTRACAPSULAR CATARACT REMOVAL W/INSERTION OF INTRAOCULAR LENS PROSTHESIS, MANUAL OR MECHANICAL TECHNIQUE;  Surgeon: Betha Brookes, MD;  Location: [facility] OR [facility];  Service: Ophthalmology    RENAL ARTERY STENT 01/11/2020    ROOT CANAL      TUBAL LIGATION  1973    [facility]    WISDOM TOOTH EXTRACTION         Family History   Problem Relation Age of Onset    Heart disease Mother     Hypertension Mother     Dementia Mother     Arthritis Mother     Heart disease Father     Thyroid disease Father     Glaucoma Father     Alcohol abuse Father     Heart attack Father 92  Arthritis Father     Hypertension Father         Massive heart attack    Arthritis Sister     Glaucoma Sister     Hypertension Sister     Diabetes Sister     Diabetes Daughter     Diabetes Paternal Aunt     No Known Problems Brother     No Known Problems Maternal Aunt     No Known Problems Maternal Uncle     No Known Problems Paternal Uncle     No Known Problems Maternal Grandmother     No Known Problems Maternal Grandfather     No Known Problems Paternal Grandmother     No Known Problems Paternal Grandfather     Arthritis Sister         Noticeable in extremities    COPD Sister     Depression Sister     Hypertension Sister     Glaucoma Sister     Asthma Daughter         Ocassional    Hypertension Daughter     Miscarriages / Stillbirths Daughter     Cancer Maternal Aunt         Died more than 25yrs ago    Early death Maternal Aunt     Diabetes Paternal Aunt         4 Aunts    Kidney disease Paternal Aunt     Amblyopia Neg Hx     Blindness Neg Hx     Cancer Neg Hx     Cataracts Neg Hx     Macular degeneration Neg Hx     Retinal detachment Neg Hx     Strabismus Neg Hx     Stroke Neg Hx       Social History     Socioeconomic History    Marital status: Single   Tobacco Use    Smoking status: Former     Current packs/day: 0.00     Types: Cigarettes     Quit date: 05/19/1984     Years since quitting: 39.7     Passive exposure: Past    Smokeless tobacco: Never   Vaping Use    Vaping status: Never Used   Substance and Sexual Activity    Alcohol use: Yes     Comment: occassional    Drug use: Never    Sexual activity: Not Currently     Partners: Male   Social History Narrative    Exercise: water aerobics 3-4 x week for an hour        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        Dentist: [facility] 2018     Social Drivers of Health     Financial Resource Strain: Low Risk  (02/23/2024)    Overall Financial Resource Strain (CARDIA)     Difficulty of Paying Living Expenses: Not very hard   Recent Concern: Financial Resource Strain - Medium Risk (02/10/2024)    Overall Financial Resource Strain (CARDIA)     Difficulty of Paying Living Expenses: Somewhat hard   Food Insecurity: No Food Insecurity (02/23/2024)    Hunger Vital Sign     Worried About Running Out of Food in the Last Year: Never true     Ran Out of Food in the Last Year: Never true   Transportation Needs: No Transportation Needs (02/23/2024)    PRAPARE - Therapist, art (Medical): No  Lack of Transportation (Non-Medical): No   Physical Activity: Sufficiently Active (01/04/2023)    Exercise Vital Sign     Days of Exercise per Week: 7 days     Minutes of Exercise per Session: 30 min   Stress: No Stress Concern Present (01/04/2023)    [employer] of Occupational Health - Occupational Stress Questionnaire     Feeling of Stress : Only a little   Social Connections: Moderately Isolated (01/04/2023)    Social Connection and Isolation Panel [NHANES]     Frequency of Communication with Friends and Family: More than three times a week     Frequency of Social Gatherings with Friends and Family: More than three times a week     Attends Religious Services: Never     Database administrator or Organizations: Yes     Attends Engineer, structural: More than 4 times per year     Marital Status: Divorced   Housing: Low Risk  (02/23/2024)    Housing     Within the past 12 months, have you ever stayed: outside, in a car, in a tent, in an overnight shelter, or temporarily in someone else's home (i.e. couch-surfing)?: No     Are you worried about losing your housing?: No       Social History     Social History Narrative    Exercise: water aerobics 3-4 x week for an hour        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        Dentist: [facility] 2018       Allergies: is allergic to penicillins, grass pollen-june grass standard, lisinopril, statins-hmg-coa reductase inhibitors, and sulfa (sulfonamide antibiotics).    Medications:   Meds:   acetaminophen  1,000 mg Oral Q8H    amlodipine  10 mg Oral Daily    [Provider Hold] clopidogrel  75 mg Oral Daily    DULoxetine  20 mg Oral Daily    enoxaparin (LOVENOX) injection  40 mg Subcutaneous Q24H    lidocaine  1 patch Transdermal Daily    lidocaine  1 patch Transdermal Daily    lidocaine  1 patch Transdermal Daily    magnesium citrate  148 mL Oral BID    multivitamins (ADULT)  1 tablet Oral Daily    polyethylene glycol  17 g Oral BID    senna  2 tablet Oral Nightly    spironolactone  25 mg Oral Daily     Continuous Infusions:  PRN Meds:.albuterol, aluminum-magnesium hydroxide-simethicone, bisacodyl, dextrose in water, glucagon, glucose, guaiFENesin, HYDROmorphone, melatonin, naloxone, ondansetron, oxyCODONE **OR** oxyCODONE, prochlorperazine **OR** prochlorperazine    Objective:   Vitals: Temp:  [36.6 ??C (97.9 ??F)-37 ??C (98.6 ??F)] 36.6 ??C (97.9 ??F)  Pulse:  [75-88] 75  Resp:  [16-20] 18  BP: (143-176)/(72-80) 144/75  MAP (mmHg):  [92-105] 95  SpO2:  [93 %-100 %] 93 %  BMI (Calculated):  [23.25] 23.25  No intake/output data recorded.    Physical Exam:  BP 144/75  - Pulse 75  - Temp 36.6 ??C (97.9 ??F) (Oral)  - Resp 18  - Ht 167.6 cm (5' 6)  - Wt 65.3 kg (144 lb)  - SpO2 93%  - BMI 23.24 kg/m??    General appearance -pleasant, well-appearing, alert, in no acute distress   Mental status -oriented to person, place, and time   Eyes -extraocular movements intact, no scleral icterus   Mouth -mucous membranes moist  Pulmonary -CTAB  Cardiovascular-regular rate  and rhythm  Gastrointestinal -soft, nontender    ECOG Performance Status: 2 - Ambulatory and capable of all selfcare but unable to carry out any work activities. Up and about more than 50% of waking hours    Test Results  Lab Results   Component Value Date    WBC 9.8 02/24/2024    HGB 10.5 (L) 02/24/2024    HCT 31.4 (L) 02/24/2024    PLT 718 (H) 02/24/2024     Lab Results   Component Value Date    NA 138 02/24/2024    K 3.8 02/24/2024    CL 100 02/24/2024    CO2 26.0 02/24/2024    BUN 10 02/24/2024    CREATININE 0.81 02/24/2024    GLU 113 02/24/2024    CALCIUM 9.8 02/24/2024    MG 2.6 02/24/2024    PHOS 3.0 02/17/2024     Lab Results   Component Value Date    BILITOT 0.3 02/22/2024    BILIDIR <0.10 02/22/2024    PROT 7.6 02/22/2024    ALBUMIN 3.1 (L) 02/22/2024    ALT 26 02/22/2024    AST 25 02/22/2024    ALKPHOS 172 (H) 02/22/2024     Lab Results   Component Value Date    INR 1.03 02/22/2024    APTT 28.3 02/22/2024     Imaging: Radiology studies were personally reviewed

## 2024-02-24 NOTE — Unmapped (Signed)
 PHYSICAL THERAPY  Evaluation (02/24/24 1455)          Patient Name:  Kayla Knight       Medical Record Number: [id number]   Date of Birth: 11-24-1940  Sex: Female        Post-Discharge Physical Therapy Recommendations:  PT Post Acute Discharge Recommendations: Skilled PT services indicated, 3x weekly   Equipment Recommendation  PT DME Recommendations: None          Treatment Diagnosis: Generalized muscle weakness        Activity Tolerance: Tolerated treatment well     ASSESSMENT  Problem List: Decreased strength, Impaired balance, Decreased endurance, Fall risk      Assessment : Kayla Knight is a 83 y.o. female whose presentation is complicated by T2DM, Resistant HTN, Hix of renal artery stenosis s/p sten L renal artery (2021) and R renal artery (2023), HLD, hx diverticulitis, persistent lower abd pain,  PET 02/10/2024 new diagnosed RUL mass with associated mediastinal and subcarinal lymphadenopathy as well as lytic lesion and R pleural effusion concerning for metastatic disease presenting for uncontrolled pain and nausea vomiting in need of expedited cancer workup.        Patient presents in hospital bed and demonstrated the ability to perform bed mobility mod I, transfers SBA, and ambulate 359ft with rollator and SBA, limited primarily by decreased strength, impaired balance, and decreased endurance.  Patient pleasant, with full participation in session. She is currently performing below functional baseline. Given current functional performance and family support, post acute PT recs are 3x to address the deficits listed above and maximize functional independence. After a review of the personal factors, comorbidities, clinical presentation, and examination of the number of affected body systems, the patient presents as a moderate complexity case.      Today's Interventions: PT eval: mobility as assessed above. Pt education: PT role and POC, activity pacing, importance of OOB mobility with assist, and discharge recs. Pt in agreement with plan     Personal Factors/Comorbidities Present: 3+   Examination of Body System: Musculoskeletal, Cardiovascular, Pulmonary, Neurological, Integumentary  Clinical Presentation: Evolving    Clinical Decision Making: Moderate        PLAN  Planned Frequency of Treatment: Plan of Care Initiated: 02/24/24  1-2x per day Weekly Frequency: 3-4 days per week  Planned Treatment Duration: 03/09/24     Planned Interventions: Education (Patient/Family/Caregiver), Gait training, Home exercise program, Self-care / Home Management training, Therapeutic Exercise, Therapeutic Activity     Goals:   Patient and Family Goals: to return to PLOF     SHORT GOAL #1: Pt will perform bed mobility with HOB flat independently               Time Frame : 2 weeks  SHORT GOAL #2: Pt will perform all functional transfers with LRAD mod I              Time Frame : 2 weeks  SHORT GOAL #3: Pt will ambulate 360ft with LRAD mod I              Time Frame : 2 weeks  SHORT GOAL #4: Pt will ascend/descend 6 steps with bilat handrails mod I               Time Frame : 2 weeks    Long Term Goal #1: Pt will perform all functional mobility independently in order to return to PLOF  Time Frame: 4 weeks     Prognosis:  Good  Positive Indicators: PLOF, family support, motivation  Barriers to Discharge: None     SUBJECTIVE  Communication Preference: Verbal     Patient reports: Pt agreeable to PT eval - RN cleared. Daughter present at bedside  Pain Comments: Pt endorses 3/10 pain in bilat hips and abdominal region with movement        Prior Functional Status: Pt reports being mod I with functional mobility PTA. She reports living with her daughter and using a rollator for mobility. Pt reports marked decline in functional mobility that started last week. Prior to the decline, pt was independent with all mobility. She reports having 2 falls in the past 6 months that required assistance to return upright. Pt reports being active, previously walking 5 miles a day and participating in recreational activities such as ping pong and sewing.  Living Situation  Living Environment: House  Lives With: Daughter (84 y/o grandson)  Home Living: Two level home, Able to Live on main level with bedroom/bathroom, Stairs to alternate level with rails, Tub/shower unit, Comfort height commode seat, Shower chair with back, Hand-held shower hose  Rail placement (inside): Bilateral rails in reach  Number of Stairs to Alternate level (inside): 3  Caregiver Identified?: Yes (mother and grandson)  Caregiver Availability: Nights  Caregiver Ability: Limited lifting  Caregiver Identified?: Yes (mother and grandson)   Equipment available at home: Hand-held shower hose, Rollator, Raised toilet seat with rails        Past Medical History:   Diagnosis Date    Anemia     past history, over 40 yrs ago    Cataract 2008    Surgery    Diabetes mellitus       Type II, on 9/15 patient stated she was not longer diabetic, not taking meds    Dry eye     Hearing impairment     Heart murmur 2016    [facility]. Jacquiline [facility]ul [facility]    Heart valve disease     Heart mumur, pt reports diagnosis 10 yrs ago (05/10/20)    History of stent insertion of renal artery 01/29/2020    ASA indefinitely and plavix x 6 months per procedure note    Hypertension     pt reports taking meds since about 1995    Metastatic malignant neoplasm   02/22/2024    Neuromuscular disorder   2014    Feet & Legs    Tear of meniscus of knee     4.5 yrs ago    Urinary incontinence     seen a doctor about nighttime bathroom use (adressed in 2019)            Social History     Tobacco Use    Smoking status: Former     Current packs/day: 0.00     Types: Cigarettes     Quit date: 05/19/1984     Years since quitting: 39.7     Passive exposure: Past    Smokeless tobacco: Never   Substance Use Topics    Alcohol use: Yes     Comment: occassional       Past Surgical History:   Procedure Laterality Date    CATARACT EXTRACTION W/ INTRAOCULAR LENS IMPLANT Left 02/27/2016    EYE SURGERY  01/30/16    Cataract surgery is scheduled.    JOINT REPLACEMENT  Knee    KNEE CARTILAGE SURGERY Right 2007, 2008    x 2    KNEE SURGERY  pt reports total left knee joint replacement surgery around 2016, 2017    PLANTAR FASCIECTOMY Left     PR COLONOSCOPY FLX DX W/COLLJ SPEC WHEN PFRMD N/A 02/11/2024    Procedure: COLONOSCOPY, FLEXIBLE, PROXIMAL TO SPLENIC FLEXURE; DIAGNOSTIC, W/WO COLLECTION SPECIMEN BY BRUSH OR WASH;  Surgeon: Earl Glassing, MD;  Location: [facility];  Service: Gastroenterology    PR REVASCULARIZE FEM/POP ARTERY,ANGIOPLASTY/STENT N/A 01/29/2020    Procedure: Peripheral Angiography W Intervetion;  Surgeon: Harvie Liner, MD;  Location: [facility] CATH;  Service: Cardiology    PR REVASCULARIZE FEM/POP ARTERY,ANGIOPLASTY/STENT Right 12/26/2021    Procedure: right renal artery intervention;  Surgeon: Harvie Liner, MD;  Location: [facility] CATH;  Service: Cardiology    PR UPPER GI ENDOSCOPY,BIOPSY N/A 04/01/2021    Procedure: UGI ENDOSCOPY; WITH BIOPSY, SINGLE OR MULTIPLE;  Surgeon: Earl Glassing, MD;  Location: [facility];  Service: Gastroenterology    PR XCAPSL CTRC RMVL INSJ IO LENS PROSTH W/O ECP Right 02/13/2016    Procedure: EXTRACAPSULAR CATARACT REMOVAL W/INSERTION OF INTRAOCULAR LENS PROSTHESIS, MANUAL OR MECHANICAL TECHNIQUE;  Surgeon: Betha Brookes, MD;  Location: [facility] OR [facility];  Service: Ophthalmology    PR XCAPSL CTRC RMVL INSJ IO LENS PROSTH W/O ECP Left 02/27/2016    Procedure: EXTRACAPSULAR CATARACT REMOVAL W/INSERTION OF INTRAOCULAR LENS PROSTHESIS, MANUAL OR MECHANICAL TECHNIQUE;  Surgeon: Betha Brookes, MD;  Location: [facility] OR [facility];  Service: Ophthalmology    RENAL ARTERY STENT  01/11/2020    ROOT CANAL      TUBAL LIGATION  1973    [facility]    WISDOM TOOTH EXTRACTION               Family History   Problem Relation Age of Onset    Heart disease Mother Hypertension Mother     Dementia Mother     Arthritis Mother     Heart disease Father     Thyroid disease Father     Glaucoma Father     Alcohol abuse Father     Heart attack Father 18    Arthritis Father     Hypertension Father         Massive heart attack    Arthritis Sister     Glaucoma Sister     Hypertension Sister     Diabetes Sister     Diabetes Daughter     Diabetes Paternal Aunt     No Known Problems Brother     No Known Problems Maternal Aunt     No Known Problems Maternal Uncle     No Known Problems Paternal Uncle     No Known Problems Maternal Grandmother     No Known Problems Maternal Grandfather     No Known Problems Paternal Grandmother     No Known Problems Paternal Grandfather     Arthritis Sister         Noticeable in extremities    COPD Sister     Depression Sister     Hypertension Sister     Glaucoma Sister     Asthma Daughter         Ocassional    Hypertension Daughter     Miscarriages / Stillbirths Daughter     Cancer Maternal Aunt         Died more than 48yrs ago    Early death Maternal Aunt     Diabetes Paternal Aunt  4 Aunts    Kidney disease Paternal Aunt     Amblyopia Neg Hx     Blindness Neg Hx     Cancer Neg Hx     Cataracts Neg Hx     Macular degeneration Neg Hx     Retinal detachment Neg Hx     Strabismus Neg Hx     Stroke Neg Hx         Allergies: Penicillins, Grass pollen-june grass standard, Lisinopril, Statins-hmg-coa reductase inhibitors, and Sulfa (sulfonamide antibiotics)                  Objective Findings  Precautions / Restrictions  Precautions: Non-applicable  Weight Bearing Status: Non-applicable (no restrictions)  Required Braces or Orthoses: Non-applicable        Equipment / Environment: Vascular access (PIV, TLC, Port-a-cath, PICC), Telemetry     Vitals/Orthostatics : VSS - Pt in NAD throughout     Cognition: WFL  Orientation: Oriented x4  Visual/Perception: Within Functional Limits  Hearing: Hearing aids not present  Hearing: Pt has been fitted for hearing aids but has not retrieved them yet     Skin Inspection: Intact where visualized     Upper Extremities  UE ROM: Right WFL, Left WFL  UE Strength: Right WFL, Left WFL    Lower Extremities  LE ROM: Right WFL, Left WFL  LE Strength: Right WFL, Left WFL          Sensation: WFL  Posture: Rounded shoulders    Static Sitting-Level of Assistance: Independent  Dynamic Sitting-Level of Assistance: Archivist Standing-Level of Assistance: Supervision  Dynamic Standing - Level of Assistance: Stand by assistance  Standing Balance comments: w/ rollator      Bed Mobility: Supine to Sit  Supine to Sit assistance level: Modified independent, requires aide device or extra time  Bed Mobility comments: Pt performed supine <> sit with HOB flat and use of bedrails mod I     Transfers: Sit to Stand  Sit to Stand assistance level: Standby assist, set-up cues, supervision of patient - no hands on  Transfer comments: Pt performed STS from EOB and toilet with rollator and supervision assist      Gait Level of Assistance: Standby assist, set-up cues, supervision of patient - no hands on  Gait Assistive Device: Other (Comment) (rollator)  Gait Distance Ambulated (ft): 300 ft  Skilled Treatment Performed: Pt ambulates 357ft with rollator and SBA for balance and safety. Pt demonstrates mild unsteadiness that improves with continued mobility. She ambulates with adequate gait speed, reciprocal gait pattern with decreased step length.     Stairs: Pt ascends/descends 6 steps with bilat handrails and SBA for safety.Pt demonstrates reciprocal pattern for ascent and descent. No LOB noted throughout.            Endurance: fair    Patient at end of session: All needs in reach, Alarm activated, Friends/Family present, In bed, Lines intact, Notified Nurse    Physical Therapy Session Duration  PT Individual [mins]: 10  PT Co-Treatment [mins]: 48 (with Benedict Brain, OT)  Reason for Co-treatment: To safely progress mobility          AM-PAC-6 click  Help currently need turning over In bed?: None - Modified Independent/Independent  Help currently needed sitting down/standing up from chair with arms? : A Little - Minimal/Contact Guard Assist/Supervision  Help currently needed moving from supine to sitting on edge of bed?: None - Modified Independent/Independent  Help currently needed  moving to and from bed from wheelchair?: A Little - Minimal/Contact Guard Assist/Supervision  Help currently needed walking in a hospital room?: A Little - Minimal/Contact Guard Assist/Supervision  Help currently needed climbing 3-5 steps with railing?: A Little - Minimal/Contact Guard Assist/Supervision    Basic Mobility Score 6 click: 20    6 click Score (in points): % of Functional Impairment, Limitation, Restriction  6: 100% impaired, limited, restricted  7-8: At least 80%, but less than 100% impaired, limited restricted  9-13: At least 60%, but less than 80% impaired, limited restricted  14-19: At least 40%, but less than 60% impaired, limited restricted  20-22: At least 20%, but less than 40% impaired, limited restricted  23: At least 1%, but less than 20% impaired, limited restricted  24: 0% impaired, limited restricted    'AM-PAC' forms are Copyright protected by [employer] [facility]         I attest that I have reviewed the above information.  Signed: Aloysius Artist, PT  Filed 02/24/2024

## 2024-02-25 LAB — BASIC METABOLIC PANEL
ANION GAP: 13 mmol/L (ref 5–14)
BLOOD UREA NITROGEN: 12 mg/dL (ref 9–23)
BUN / CREAT RATIO: 15
CALCIUM: 9.8 mg/dL (ref 8.7–10.4)
CHLORIDE: 98 mmol/L (ref 98–107)
CO2: 25 mmol/L (ref 20.0–31.0)
CREATININE: 0.8 mg/dL (ref 0.55–1.02)
EGFR CKD-EPI (2021) FEMALE: 74 mL/min/1.73m2 (ref >=60)
GLUCOSE RANDOM: 113 mg/dL (ref 70–179)
POTASSIUM: 4.1 mmol/L (ref 3.4–4.8)
SODIUM: 136 mmol/L (ref 135–145)

## 2024-02-25 LAB — CBC
HEMATOCRIT: 31.4 % — ABNORMAL LOW (ref 34.0–44.0)
HEMOGLOBIN: 10.5 g/dL — ABNORMAL LOW (ref 11.3–14.9)
MEAN CORPUSCULAR HEMOGLOBIN CONC: 33.3 g/dL (ref 32.0–36.0)
MEAN CORPUSCULAR HEMOGLOBIN: 23.6 pg — ABNORMAL LOW (ref 25.9–32.4)
MEAN CORPUSCULAR VOLUME: 70.9 fL — ABNORMAL LOW (ref 77.6–95.7)
MEAN PLATELET VOLUME: 6.6 fL — ABNORMAL LOW (ref 6.8–10.7)
PLATELET COUNT: 771 10*9/L — ABNORMAL HIGH (ref 150–450)
RED BLOOD CELL COUNT: 4.44 10*12/L (ref 3.95–5.13)
RED CELL DISTRIBUTION WIDTH: 16.2 % — ABNORMAL HIGH (ref 12.2–15.2)
WBC ADJUSTED: 10.2 10*9/L (ref 3.6–11.2)

## 2024-02-25 LAB — MAGNESIUM: MAGNESIUM: 2.8 mg/dL — ABNORMAL HIGH (ref 1.6–2.6)

## 2024-02-25 MED ADMIN — magnesium citrate solution 148 mL: 148 mL | ORAL | @ 12:00:00

## 2024-02-25 MED ADMIN — lidocaine (ASPERCREME) 4 % 1 patch: 1 | TRANSDERMAL | @ 12:00:00

## 2024-02-25 MED ADMIN — acetaminophen (TYLENOL) tablet 1,000 mg: 1000 mg | ORAL | @ 09:00:00

## 2024-02-25 MED ADMIN — senna (SENOKOT) tablet 2 tablet: 2 | ORAL | @ 01:00:00

## 2024-02-25 MED ADMIN — polyethylene glycol (MIRALAX) packet 17 g: 17 g | ORAL | @ 12:00:00

## 2024-02-25 MED ADMIN — amlodipine (NORVASC) tablet 10 mg: 10 mg | ORAL | @ 12:00:00

## 2024-02-25 MED ADMIN — oxyCODONE (ROXICODONE) immediate release tablet 10 mg: 10 mg | ORAL | @ 19:00:00 | Stop: 2024-03-08

## 2024-02-25 MED ADMIN — enoxaparin (LOVENOX) syringe 40 mg: 40 mg | SUBCUTANEOUS | @ 01:00:00

## 2024-02-25 MED ADMIN — polyethylene glycol (MIRALAX) packet 17 g: 17 g | ORAL | @ 01:00:00

## 2024-02-25 MED ADMIN — multivitamins, therapeutic with minerals tablet 1 tablet: 1 | ORAL | @ 12:00:00

## 2024-02-25 MED ADMIN — DULoxetine (CYMBALTA) DR capsule 20 mg: 20 mg | ORAL | @ 12:00:00

## 2024-02-25 MED ADMIN — acetaminophen (TYLENOL) tablet 1,000 mg: 1000 mg | ORAL | @ 01:00:00

## 2024-02-25 MED ADMIN — spironolactone (ALDACTONE) tablet 25 mg: 25 mg | ORAL | @ 12:00:00

## 2024-02-25 NOTE — Unmapped (Signed)
 Palliative Care Progress Note      Consultation from Requesting Attending Physician:  Princess Brooks, MD  Primary Care Provider:  Powell-Tillman, Levonne Genese, MD      Assessment/Plan:      SUMMARY:  This 83 y.o. 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     Today's visit addressed symptom control    Symptom Assessment and Recommendations:      Uncontrolled abdominal pain - Iliac and paraspinal lesions c/f metastatic disease  She has only required 2 doses of the oxycodone 10 mg. She feels her pain is well controlled but she has not been able to get out of bed much. Discussed that we could see how much she needs throughout the weekend prior to considering switching to a long acting.   - Continue multimodal pain control with scheduled tylenol and lidocaine patches  - Continue oxycodone 10-15 mg q4H PRN  - Continue dilaudid q4H PRN for pain  - Will transition to long acting opioids in coming days  - Continue duloxetine 20 mg daily for nerve related pain  - Continue miralax and senna for bowel regimen while on opioids  - Continue dulcolax suppository PRN     Nausea  She presented to the hospital with nausea but feels that this has been relatively well controlled. She currently has both zofran and compazine available. If she has trouble having bowel movements, would switch to compazine 1st line rather than zofran.   - Continue zofran IV q8H PRN  - Continue compazine as second line    Goals of care / ACP:  Code Status:   Code Status: DNR and DNI   Healthcare decision-maker if lacks capacity:   HCDM (HCPOA): Abeera, Flannery - Daughter - [phone]      Did not discuss today, will await biopsy.      Practical, Emotional, Spiritual Support Recommendations:       Recommendations shared with primary team via Epic chat        Thank you for this consult. Please contact Emelda Hane or page Palliative Care if there are any questions.   Palliative Care team plans to visit this patient again on 6/2    Subjective:     Recent Events:  Biopsy delayed until Monday. She has been doing well, went on several walks today. No nausea.             Objective:       Function:  70% - Ambulation: Reduced / unable to do normal work, some evidence of disease / Self-Care: Full / Intake: Normal or reduced / Level of Conscious: Full    Temp:  [36.5 ??C (97.7 ??F)-36.8 ??C (98.3 ??F)] 36.8 ??C (98.3 ??F)  Pulse:  [71-84] 84  Resp:  [18] 18  BP: (135-157)/(73-80) 135/75  SpO2:  [97 %-100 %] 98 %    Physical Exam:  Constitutional: In NAD, resting comfortably in bed   Eyes: anicteric sclera, no discharge  ENMT: Moist oral mucosa  Pulm: Breathing comfortably on RA   CV: RRR  Psych: mood and affect appropriate    Testing reviewed and interpreted:   None today    I personally spent 30 minutes face-to-face and non-face-to-face in the care of this patient, which includes all pre, intra, and post  visit time on the date of service.  All documented time was specific to the E/M visit and does not include any procedures that may have been performed.     See ACP Note from today for additional billable service:  No.

## 2024-02-25 NOTE — Unmapped (Signed)
 Internal Medicine (MEDL) Progress Note    Assessment & Plan:   Kayla Knight is a 83 y.o. 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    Principal Problem:    Uncontrolled pain  Active Problems:    Benign essential hypertension    Type 2 diabetes mellitus, without long-term current use of insulin      Renal artery stenosis, native, bilateral (CMS-HCC)    Hyperlipidemia, unspecified    Nonrheumatic aortic valve insufficiency    S/P cataract extraction and insertion of intraocular lens, left    Malignant neoplasm of upper lobe of right lung      Drug-induced constipation      Active Problems  Uncontrolled Abdominal Pain - RUL Mass- Adrenal Mass- Iliac and paraspinal lesions c/f malignancy   Pt evaluated by undiagnosed cancer clinic on 02/01/24 with Hematology/Oncology for concerning RUL mass. Pt had recent 40 lb weight loss and night sweats over the past 1-2 months. PET Scan 5/15 showed centrally located right upper lobe lung mass and hypermetabolic adrenal glands, R mediastinal/subcarinal lymph nodes, lytic lesions, soft tissues of the right common iliac, and left paraspinal region above L5, and in the left gluteus maximus muscle, suspicious for malignancy. PET also showed a small right pleural effusion. Some of her pain regions seem to correlate with hypermetabolic regions from PET while some of her other pain seems related to constipation. She was previously well controlled with 5mg  of oxycodone q6 hours but pain load has increased over the last 3 weeks with a functional decline. The day prior to admission, she required 10-15 mg oxycodone q4-6h for pain management. Due to inadequate pain control, outpatient IP clinic sent her to [facility] for better pain management and expedited workup for malignancy. Patient hemodynamically stable. Patient's pain is currently well-controlled with regimen as below.  - Oncology consulted, appreciate recs   - Plan for MRI brain for staging   - Agree with biopsy for tissue diagnosis, whichever site can be accessed sooner  - VIR consulted for biopsy, appreciate recs:   - Plan for vertebral bone biopsy 6/2 (currently scheduled for outpatient)  - Interventional pulmonology consulted; appreciate recs:   - Scheduled for endobronchial biopsy 6/4, although will defer this if able to get the bone biopsy sooner  - Palliative care consulted, appreciate recs   - Continue duloxetine 20 mg daily for neuropathic pain  - Pain:   - Acetaminophen 1000 mg q8  - Scheduled lidocaine patch  - Oxycodone 5mg  q4 PRN moderate pain   - Dilaudid 0.5 mg q4 PRN severe pain   - IV Zofran 4 mg Q8h PRN   - IV Compazine 5 mg PRN  - Nutrition consult   - PT/OT    Opioid-induced constipation  Patient with worsening constipation and abdominal pain in the last couple weeks since starting opioids. Suspect that this is a contributor to patient's acute pain symptoms. At home, she was getting Miralax BID and mag citrate BID per daughter, with more regular BM and improved pain. She received smog enema in the ED with immediate improvement of her pain. CT Abd/Pelvis showing multiple lytic lesions corresponding to PET, otherwise no evidence of malignancy  within the abdomen or pelvis. Patient's constipation and abdominal pain improving with resumption of home bowel regimen as below.  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 BP on admission is elevated 165/72 likely secondary to uncontrolled pain. Last dose of plavix 5/27 in AM.   - Amlodipine 10 mg daily   - Spironolactone 25 mg daily   - Hold Plavix 75 mg prior to procedure   - BMP daily     Mobility Concerns:  The beneficiary has a mobility limitation that significantly impairs his/her ability to participate in one or more mobility related activities of daily living in the home.   Has a mobility limitation that significantly impairs his/her ability to participate in one or more mobility-related activities of daily living (MRADLs) ?? Has a mobility limitation that place the patient at reasonably determined heightened risk of morbidity or mortality secondary to the attempts to perform the MRADL and prevents the patient from completing the MRADL within a reasonable time frame. ??The patient is able to safely use the rollator. ??The functional mobility deficit can be sufficiently resolved with the use of a rollator.     Chronic Problems  T2DM  Well controlled w/o medications. Most recent A1c 5.7 on 10/22.  - CTM    HLD  Continue home evolocumab     Diverticulitis  Recent colonoscopy on 02/11/2024 showed 6 mm polyp, diverticulosis without malignancy, active bleeding.   - CTM      Asthma   - Albuterol Q6h PRN       The patient's presentation is complicated by the following clinically significant conditions requiring additional evaluation and treatment: - Age related debility POA requiring additional resources: DME, PT, or OT  - Metastatic cancer POA requiring further investigation, treatment, or monitoring    Issues Impacting Complexity of Management:  -High risk of complications from pain and/or analgesia likely to result in delirium      Medical Decision Making: Discussed the patient's management and/or test interpretation with Pulmonology and interventional radiology as summarized within this note      Daily Checklist:  Diet: Regular Diet  DVT PPx: Lovenox 40mg  q24h  Electrolytes: Replete Potassium to >/= 3.6 and Magnesium to >/= 1.8  Code Status: DNR and DNI  Dispo: Continue floor care    Team Contact Information:   Primary Team: Internal Medicine (MEDL)  Primary Resident: Dorthea Gauze, MD  Resident's Pager: [phone] (Gen MedL Intern - Tower)    Interval History:   No acute events overnight. Patient resting comfortably in bed this AM. Reports no abdominal pain after several bowel movements overnight. Required minimal PRN oxycodone. She does note some pain when she physically gets out of bed but is more comfortable once she is up and moving around.    All other systems were reviewed and are negative except as noted in the HPI    Objective:   Temp:  [36.5 ??C (97.7 ??F)-36.8 ??C (98.3 ??F)] 36.8 ??C (98.3 ??F)  Pulse:  [71-84] 84  Resp:  [18] 18  BP: (135-157)/(73-80) 135/75  SpO2:  [97 %-100 %] 98 %    Gen: NAD, appears chronically ill  HENT: atraumatic, normocephalic  Heart: RRR  Lungs: CTAB, no crackles  or wheezes  Abdomen: soft, non-tender and non-distended  Extremities: No edema    [employer] copy, MS4    I attest that I have reviewed the medical student note and that the components of the history of the present illness, the physical exam, and the assessment and plan documented were performed by me or were performed in my presence by the student where I verified the documentation and performed (or re-performed) the exam and medical decision making.     Jabier Martens, MD  PGY-1 Internal Medicine

## 2024-02-25 NOTE — Unmapped (Signed)
 Interventional Pulmonary Initial Consult Note     Date of Service: 02/25/2024  Requesting Physician: Princess Brooks, MD   Requesting Service: [facility])  Reason for consultation: Comprehensive evaluation of abnormal thoracic imaging.    Assessment      Interval Events: pain control improved since admission, feeling much better but not ready to leave the hospital, Considering dietary changes with the hope of treating her cancer.        Impression:  Kayla Knight is a 83 y.o.female admitted for expedited malignancy evaluation and supportive care in the context of recent discovery of PET avid RUL mass with associated mediastinal and subcarinal lymphadenopathy as well as lytic lesion and R pleural effusion concerning for metastatic disease.       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 She does not need both an IR biopsy and endobronchial biopsy (unless tissue from one is non-diagnostic or insufficient) - please discuss with oncology/patient and family   - plavix needs to be held for 5 days prior to biopsy     This patient was seen and evaluated with Dr.Akulian. The recommendations outlined in this note were discussed w the primary team via epic chat. Please page the Interventional Pulmonology pager at [phone] with any questions and notify the IP service with discharge plans to ensure the patient has appropriate IP follow up. We appreciate the opportunity to assist in the care of this patient. We will sign off at this time.    Valda Garnet, MD    Subjective & Objective     Hospital Problems:  Principal Problem:    Uncontrolled pain  Active Problems:    Benign essential hypertension    Type 2 diabetes mellitus, without long-term current use of insulin      Renal artery stenosis, native, bilateral (CMS-HCC)    Hyperlipidemia, unspecified    Nonrheumatic aortic valve insufficiency    S/P cataract extraction and insertion of intraocular lens, left    Malignant neoplasm of upper lobe of right lung      Drug-induced constipation      HPI: Kayla Knight is a 83 y.o. female with history of HTN, T2DM, HLD who was admitted for expedited malignancy workup and management of cancer-related pain.    She was initially seen 01/21/24 in the ER for abdominal pain where Xrays showed bony lesions in the R inferior pubic ramus, L posterior medial rib, iliac crest. She was seen in undiagnosed cancer  clinic where PET scan was ordered and she was referred to us .      History provided by patient and her family - daughter, grandson, granddaughter. She has had a rapid decline over 3 weeks, was previously living independently (prepared her own meals, was making quilts for her great granddaughter). In the last week, has moved in with her daughter and was living on her couch until they were able to set up her bed in their living room.     She has had ongoing dyspnea and has been relying on her inhalers - using mostly at nights - with relief. She has lost more than 20lbs in the last few months.      Her paid has rapidly escalated and she is now doubling and tripling her doses of oxycodone at home with lessening effect. She has pain in her abdomen, thighs, hips and back. Oxycodone does help but has been difficult to tolerate due to nausea  and constipation which they are now managing with mag citrate and miralax.     She smoked ~1-2 packs per week for 30-10yrs, quit 20 yrs ago. She denies E cig or vaping use. Maternal aunt with uterine cancer but no other lung cancer or other cancer diagnosis in family. 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     Problems addressed during this consult include abnormal imaging, concern for malignancy. Based on these problems, the patient has high risk of morbidity/mortality which is commensurate w their risk of management options described below in the recommendations.      Vitals - past 24 hours  Temp:  [36.5 ??C (97.7 ??F)-36.8 ??C (98.3 ??F)] 36.8 ??C (98.3 ??F)  Pulse:  [71-84] 84  Resp:  [18] 18  BP: (135-157)/(73-80) 135/75  SpO2:  [97 %-100 %] 98 % Intake/Output  No intake/output data recorded.      Pertinent exam findings:    General: No acute distress, well-nourished, well-groomed female, lying flat in bed  HEENT: Moist mucus membranes, no mucosal or conjunctival pallor  Respiratory: respirations even/unlabored on room air   Cardiovascular: Regular rate  Extremities: Moves all extremities well, normal capillary refill, mild clubbing present  Integumentary: Intact, warm, dry no rashes  Neurologic: Alert & Oriented   Psychiatric: Age appropriate, logical thought, full range of affect      Pertinent Imaging Data     PET CT 02/10/24  CTA 11/12/23 - RLL nodule but otherwise no effusions or other nodules compared to PET CT:         Pertinent Micro Data:  Reviewed    Risk Factors:  Prior pleural drainage or thoracic surgery? no  Full dose AC? no  Any anti-platelets in last 5 days besides ASA 81? yes    Current vent settings:       Arterial Blood Gas:   No results for input(s): SPECTYPEART, PHART, PCO2ART, PO2ART, HCO3ART, BEART, O2SATART in the last 24 hours.     Venous Blood Gas:   No results for input(s): PHVEN, PCO2VEN, PO2VEN, HCO3VEN, BEVEN, O2SATVEN in the last 24 hours.     Cultures:  Urine Culture, Comprehensive (no units)   Date Value   02/23/2024 Mixed Urogenital Flora   02/18/2024 (A)    10,000 to 50,000 CFU/mL Streptococcus agalactiae (group b)   02/18/2024 10,000 to 50,000 CFU/mL Mixed Urogenital Flora (A)     WBC (10*9/L)   Date Value   02/25/2024 10.2     WBC, UA (/HPF)   Date Value   02/23/2024 14 (H)          Other Labs:  Lab Results   Component Value Date    WBC 10.2 02/25/2024    HGB 10.5 (L) 02/25/2024    HCT 31.4 (L) 02/25/2024    PLT 771 (H) 02/25/2024     Lab Results   Component Value Date    NA 136 02/25/2024    K 4.1 02/25/2024    CL 98 02/25/2024    CO2 25.0 02/25/2024    BUN 12 02/25/2024    CREATININE 0.80 02/25/2024    GLU 113 02/25/2024    CALCIUM 9.8 02/25/2024    MG 2.8 (H) 02/25/2024    PHOS 3.0 02/17/2024     Lab Results   Component Value Date    BILITOT 0.3 02/22/2024    BILIDIR <0.10 02/22/2024    PROT 7.6 02/22/2024    ALBUMIN 3.1 (L)  02/22/2024    ALT 26 02/22/2024    AST 25 02/22/2024    ALKPHOS 172 (H) 02/22/2024     Lab Results   Component Value Date    INR 1.03 02/22/2024    APTT 28.3 02/22/2024       Allergies & Home Medications   Personally reviewed in Epic    Continuous Infusions:       Scheduled Medications:    acetaminophen  1,000 mg Oral Q8H    amlodipine  10 mg Oral Daily    [Provider Hold] clopidogrel  75 mg Oral Daily    DULoxetine  20 mg Oral Daily    enoxaparin (LOVENOX) injection  40 mg Subcutaneous Q24H    lidocaine  1 patch Transdermal Daily    lidocaine  1 patch Transdermal Daily    lidocaine  1 patch Transdermal Daily    magnesium citrate  148 mL Oral BID    multivitamins (ADULT)  1 tablet Oral Daily    polyethylene glycol  17 g Oral BID    senna  2 tablet Oral Nightly    spironolactone  25 mg Oral Daily       PRN medications:  albuterol, aluminum-magnesium hydroxide-simethicone, bisacodyl, dextrose in water, glucagon, glucose, guaiFENesin, HYDROmorphone, melatonin, naloxone, ondansetron, oxyCODONE **OR** oxyCODONE, prochlorperazine **OR** prochlorperazine

## 2024-02-25 NOTE — Unmapped (Signed)
 02/25/24 scheduled by Latricia Poles, f/u with Sherline Distel. Bx approved by Loraine Roca 5/30/NO Need to HOLD PLAVIX, discussed pre procedure instructions, confirmed daughter as driver, confirmed [employer] Ins.Called daughter Shanicka Oldenkamp to notify of D/T/L of procedure. Daughter stated pt will not DC from [facility] per the Team. I made Benjiman Bras aware.(SG)

## 2024-02-25 NOTE — Unmapped (Signed)
 Shift Summary   Pain increased from 0 to 6 on the pain scale by the afternoon, and oxyCODONE was administered PRN after initial refusal.    Loss of appetite was noted in the morning, with no improvement in nutritional intake.    Hourly visual checks and toilet assistance every two hours were consistently performed, preventing falls or injuries.    Four wheel walker was used for mobility, and assistance level improved to modified independent.    Overall, pain management was challenging, but mobility and fall prevention were effectively maintained.     Improved Nutritional Intake: Loss of appetite was noted in the morning, and there was no indication of improvement in nutritional intake throughout the shift.     Absence of Fall and Fall-Related Injury: Hourly visual checks and toilet assistance every two hours were consistently performed, and no falls or injuries were reported during the shift.     Optimal Pain Control and Function: Pain was initially reported as 0 on the pain scale but increased to 6 by the afternoon, with oxyCODONE administered PRN in the afternoon after initial refusal.

## 2024-02-25 NOTE — Unmapped (Signed)
 [facility] Procedure Request    Referring Provider:  Princess Brooks, MD    Date of Review:  02/25/2024    Reviewing Provider:  Nathanael Baker, MD     Requested Site:  Any FDG avid bone metastasis. Right upper lobe lung mass with FDG avid mediastinal lymph nodes as well concerning for lung primary.    Reason for Request:  Histopathologic diagnosis    Past Medical History:    Past Medical History:   Diagnosis Date    Anemia     past history, over 40 yrs ago    Cataract 2008    Surgery    Diabetes mellitus       Type II, on 9/15 patient stated she was not longer diabetic, not taking meds    Dry eye     Hearing impairment     Heart murmur 2016    [facility]. Jacquiline Maul @ [facility]    Heart valve disease     Heart mumur, pt reports diagnosis 10 yrs ago (05/10/20)    History of stent insertion of renal artery 01/29/2020    ASA indefinitely and plavix x 6 months per procedure note    Hypertension     pt reports taking meds since about 1995    Metastatic malignant neoplasm   02/22/2024    Neuromuscular disorder   2014    Feet & Legs    Tear of meniscus of knee     4.5 yrs ago    Urinary incontinence     seen a doctor about nighttime bathroom use (adressed in 2019)       Imaging reviewed:  I reviewed PET/CT 02/10/24; Interpretation: Multiple FDG avid lytic bone lesions      Recommended Imaging Modality:  CT 6 (MSK)    Procedure Location:  Main campus    Anticoagulation hold: No    Specimen Media:  Saline    Additional Comments:  Request for urgent biopsy in the next week    Carmella Cho, MD  Assistant Professor, Interventional Radiology

## 2024-02-25 NOTE — Unmapped (Signed)
 Shift Summary   Pain was effectively managed with oxyCODONE, reducing the pain level from 5 to 0.    Safety measures were consistently applied, preventing any falls or injuries.    Patient declined further intervention for pain and constipation.    Bowel movements were consistent with medium amount and brown color.    Overall, the patient remained stable with effective pain management and no falls.     Absence of Fall and Fall-Related Injury: Maintained safety with consistent use of safety devices and hourly visual checks, with no falls or injuries reported during the shift.     Optimal Pain Control and Function: Pain was initially reported at a level of 5 but was reduced to 0 after administration of oxyCODONE.

## 2024-02-26 LAB — CBC
HEMATOCRIT: 31.2 % — ABNORMAL LOW (ref 34.0–44.0)
HEMOGLOBIN: 10.1 g/dL — ABNORMAL LOW (ref 11.3–14.9)
MEAN CORPUSCULAR HEMOGLOBIN CONC: 32.4 g/dL (ref 32.0–36.0)
MEAN CORPUSCULAR HEMOGLOBIN: 22.5 pg — ABNORMAL LOW (ref 25.9–32.4)
MEAN CORPUSCULAR VOLUME: 69.5 fL — ABNORMAL LOW (ref 77.6–95.7)
MEAN PLATELET VOLUME: 6.7 fL — ABNORMAL LOW (ref 6.8–10.7)
PLATELET COUNT: 744 10*9/L — ABNORMAL HIGH (ref 150–450)
RED BLOOD CELL COUNT: 4.48 10*12/L (ref 3.95–5.13)
RED CELL DISTRIBUTION WIDTH: 16.7 % — ABNORMAL HIGH (ref 12.2–15.2)
WBC ADJUSTED: 10.4 10*9/L (ref 3.6–11.2)

## 2024-02-26 LAB — MAGNESIUM: MAGNESIUM: 2.5 mg/dL (ref 1.6–2.6)

## 2024-02-26 LAB — LIPID PANEL
CHOLESTEROL: 161 mg/dL (ref <200)
HDL CHOLESTEROL: 40 mg/dL — ABNORMAL LOW (ref >50)
LDL CHOLESTEROL CALCULATED: 101 mg/dL — ABNORMAL HIGH (ref <100)
NON-HDL CHOLESTEROL: 121 mg/dL (ref <130)
TRIGLYCERIDES: 127 mg/dL (ref <150)

## 2024-02-26 LAB — BASIC METABOLIC PANEL
ANION GAP: 11 mmol/L (ref 5–14)
BLOOD UREA NITROGEN: 12 mg/dL (ref 9–23)
BUN / CREAT RATIO: 15
CALCIUM: 9.4 mg/dL (ref 8.7–10.4)
CHLORIDE: 97 mmol/L — ABNORMAL LOW (ref 98–107)
CO2: 27 mmol/L (ref 20.0–31.0)
CREATININE: 0.79 mg/dL (ref 0.55–1.02)
EGFR CKD-EPI (2021) FEMALE: 75 mL/min/1.73m2 (ref >=60)
GLUCOSE RANDOM: 114 mg/dL (ref 70–179)
POTASSIUM: 4 mmol/L (ref 3.4–4.8)
SODIUM: 135 mmol/L (ref 135–145)

## 2024-02-26 LAB — HEMOGLOBIN A1C
ESTIMATED AVERAGE GLUCOSE: 117 mg/dL
HEMOGLOBIN A1C: 5.7 % — ABNORMAL HIGH (ref 4.8–5.6)

## 2024-02-26 LAB — TSH: THYROID STIMULATING HORMONE: 2.142 u[IU]/mL (ref 0.550–4.780)

## 2024-02-26 MED ADMIN — oxyCODONE (ROXICODONE) immediate release tablet 10 mg: 10 mg | ORAL | @ 02:00:00 | Stop: 2024-03-08

## 2024-02-26 MED ADMIN — oxyCODONE (ROXICODONE) immediate release tablet 10 mg: 10 mg | ORAL | @ 09:00:00 | Stop: 2024-03-08

## 2024-02-26 MED ADMIN — lidocaine (ASPERCREME) 4 % 1 patch: 1 | TRANSDERMAL | @ 13:00:00

## 2024-02-26 MED ADMIN — spironolactone (ALDACTONE) tablet 25 mg: 25 mg | ORAL | @ 13:00:00

## 2024-02-26 MED ADMIN — magnesium citrate solution 148 mL: 148 mL | ORAL | @ 13:00:00

## 2024-02-26 MED ADMIN — multivitamins, therapeutic with minerals tablet 1 tablet: 1 | ORAL | @ 13:00:00

## 2024-02-26 MED ADMIN — gadopiclenol (ELUCIREM,VUEWAY) injection 6.5 mL: 6.5 mL | INTRAVENOUS | Stop: 2024-02-25

## 2024-02-26 MED ADMIN — DULoxetine (CYMBALTA) DR capsule 20 mg: 20 mg | ORAL | @ 13:00:00

## 2024-02-26 MED ADMIN — enoxaparin (LOVENOX) syringe 40 mg: 40 mg | SUBCUTANEOUS | @ 02:00:00

## 2024-02-26 MED ADMIN — acetaminophen (TYLENOL) tablet 1,000 mg: 1000 mg | ORAL | @ 02:00:00

## 2024-02-26 MED ADMIN — acetaminophen (TYLENOL) tablet 1,000 mg: 1000 mg | ORAL | @ 09:00:00

## 2024-02-26 MED ADMIN — acetaminophen (TYLENOL) tablet 1,000 mg: 1000 mg | ORAL | @ 19:00:00

## 2024-02-26 MED ADMIN — amlodipine (NORVASC) tablet 10 mg: 10 mg | ORAL | @ 13:00:00

## 2024-02-26 NOTE — Unmapped (Signed)
 Initial Consult Note        Requesting Attending Physician:  Princess Brooks, MD  Service Requesting Consult: [facility]     Assessment and Plan          Kayla Knight is a 83 y.o. ambidextrous female on whom I have been asked by Princess Brooks, MD to consult for stroke seen on MRI.    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 FLAIR with extensive small vessel disease.  Patient reports that she has no focal deficits, though patient is generally weak and was here for pain management.  Given no clinical symptoms, unclear last known normal. We would estimate that the infarct is in the order of days to a week.  Reassuringly, she has no clear deficits and her exam is intact.  Given the location of the infarct, it is most likely caused by small vessel disease with known resistant hypertension, diabetes, and hyperlipidemia. A1C 5.7 and LDL 101. Although not asked, appears patient has quit smoking in 1985. However, given her malignancy, we would recommend completing stroke workup with echocardiogram and vessel imaging.  She already is on Plavix, though it is currently being held for possible biopsy. We would recommend resuming when safe and able per primary team.  - Agree with echocardiogram  - Please obtain PVL of Carotid duplex  - May resume Plavix when able  - Consider resuming statin if able  - Goal normotension  - Risk reduction of DM and Lipid Panel  - PT/OT/SLP    This patient was seen and discussed with Dr. Donna Fus, who agrees with the above assessment and plan.      Madolyn Schlatter, DO  PGY-2 Neurology         HPI        Reason for Consult: stroke on MRI    Kayla Knight is a 83 y.o. ambidextrous female on whom I have been asked by Princess Brooks, MD to consult for stroke seen on MRI.    Patient initially presented to the hospital because of worsening abdominal pain and was admitted for expedited oncological workup as well as IV pain management.    She reports that she had no focal neurological deficits, including weakness, numbness, or difficulty speaking.    At this time, she has been being worked up for a right upper lobe mass along with weight loss.  In addition, she has had persistent hypertension, hyperlipidemia, and diabetes.    While undergoing malignancy workup with oncology, MRI brain was pursued to look for metastases but an acute punctate infarct was incidentally found, for which neurology was consulted.    Allergies   Allergen Reactions    Penicillins Itching and Swelling    Grass Pollen-June Grass Standard Other (See Comments)     Asthma attack     Lisinopril Cough    Statins-Hmg-Coa Reductase Inhibitors      Ineffective at controlling cholesterol when tried in the past.    Sulfa (Sulfonamide Antibiotics) Itching      Current Facility-Administered Medications   Medication Dose Route Frequency Provider Last Rate Last Admin    acetaminophen (TYLENOL) tablet 1,000 mg  1,000 mg Oral Q8H Arther Larve, MD   1,000 mg at 02/26/24 0512    albuterol (PROVENTIL HFA;VENTOLIN HFA) 90 mcg/actuation inhaler 2 puff  2 puff Inhalation Q6H PRN Arther Larve, MD  aluminum-magnesium hydroxide-simethicone (MAALOX MAX) 80-80-8 mg/mL oral suspension  30 mL Oral Q4H PRN Arther Larve, MD        amlodipine (NORVASC) tablet 10 mg  10 mg Oral Daily Arther Larve, MD   10 mg at 02/26/24 1610    [Provider Hold] atorvastatin (LIPITOR) tablet 40 mg  40 mg Oral Daily Jori Newer, MD        bisacodyl (DULCOLAX) suppository 10 mg  10 mg Rectal Daily PRN Buckley, Ryanne, MD   10 mg at 02/23/24 1521    [Provider Hold] clopidogrel (PLAVIX) tablet 75 mg  75 mg Oral Daily Arther Larve, MD        dextrose 50 % in water (D50W) 50 % solution 12.5 g  12.5 g Intravenous Q15 Min PRN Arther Larve, MD        DULoxetine (CYMBALTA) DR capsule 20 mg  20 mg Oral Daily Tama Fails, Christian A, MD   20 mg at 02/26/24 0917    enoxaparin (LOVENOX) syringe 40 mg  40 mg Subcutaneous Q24H Buckley, Ryanne, MD   40 mg at 02/25/24 2135    glucagon injection 1 mg  1 mg Intramuscular Once PRN Arther Larve, MD        glucose chewable tablet 16 g  16 g Oral Q10 Min PRN Arther Larve, MD        guaiFENesin (ROBITUSSIN) oral syrup  200 mg Oral Q4H PRN Arther Larve, MD        HYDROmorphone (PF) (DILAUDID) injection 1 mg  1 mg Intravenous Q4H PRN Buckley, Ryanne, MD        lidocaine (ASPERCREME) 4 % 1 patch  1 patch Transdermal Daily Arther Larve, MD   1 patch at 02/26/24 0918    lidocaine (ASPERCREME) 4 % 1 patch  1 patch Transdermal Daily Buckley, Ryanne, MD   1 patch at 02/26/24 0917    lidocaine (ASPERCREME) 4 % 1 patch  1 patch Transdermal Daily Buckley, Ryanne, MD   1 patch at 02/26/24 0917    magnesium citrate solution 148 mL  148 mL Oral BID Buckley, Ryanne, MD   148 mL at 02/26/24 9604    melatonin tablet 3 mg  3 mg Oral Nightly PRN Arther Larve, MD        multivitamins, therapeutic with minerals tablet 1 tablet  1 tablet Oral Daily Buckley, Ryanne, MD   1 tablet at 02/26/24 0917    naloxone (NARCAN) injection 0.1 mg  0.1 mg Intravenous Q5 Min PRN Arther Larve, MD        ondansetron (ZOFRAN-ODT) disintegrating tablet 4 mg  4 mg Oral Q8H PRN Jabier Martens A, MD        oxyCODONE (ROXICODONE) immediate release tablet 10 mg  10 mg Oral Q4H PRN Buckley, Ryanne, MD   10 mg at 02/26/24 5409    Or    oxyCODONE (ROXICODONE) immediate release tablet 15 mg  15 mg Oral Q4H PRN Buckley, Ryanne, MD        polyethylene glycol (MIRALAX) packet 17 g  17 g Oral BID Buckley, Ryanne, MD   17 g at 02/25/24 0755    prochlorperazine (COMPAZINE) injection 2.5 mg  2.5 mg Intravenous Q8H PRN Arther Larve, MD        Or    prochlorperazine (COMPAZINE) injection 5 mg  5 mg Intravenous Q8H PRN Arther Larve, MD        senna Alonna Art) tablet 2 tablet  2 tablet Oral Nightly Buckley, Ryanne, MD   2 tablet  at 02/24/24 2121    spironolactone (ALDACTONE) tablet 25 mg  25 mg Oral Daily Arther Larve, MD   25 mg at 02/26/24 1027      Medications Prior to Admission   Medication Sig Dispense Refill Last Dose/Taking    acetaminophen (TYLENOL) 500 MG tablet Take 3 tablets (1,500 mg total) by mouth every eight (8) hours.       albuterol HFA 90 mcg/actuation inhaler Inhale 2 puffs every six (6) hours as needed. 8 g 0     amlodipine (NORVASC) 10 MG tablet Take 1 tablet (10 mg total) by mouth daily. 90 tablet 3     clopidogrel (PLAVIX) 75 mg tablet TAKE 1 TABLET (75 MG TOTAL) BY MOUTH DAILY. 90 tablet 2     empty container (SHARPS-A-GATOR DISPOSAL SYSTEM) Misc Use as directed for sharps disposal 1 each 2     evolocumab (REPATHA SURECLICK) 140 mg/mL PnIj Inject the contents of 1 pen (140 mg) under the skin every fourteen (14) days. 6 mL 3     lidocaine (LIDODERM) 5 % patch Place 1 patch on the skin daily. Apply to affected area for 12 hours only each day (then remove patch) 30 patch 0     MAGNESIUM CITRATE ORAL Take 1 tablet by mouth Three (3) times a day as needed.       magnesium oxide (MAG-OX) 400 mg (241.3 mg elemental magnesium) tablet Take 1 tablet (400 mg total) by mouth daily.       MIEBO, PF, 100 % Drop Administer to both eyes two (2) times a day.       [EXPIRED] ondansetron (ZOFRAN-ODT) 4 MG disintegrating tablet Dissolve 1 tablet (4 mg total) in the mouth every eight (8) hours as needed for nausea for up to 7 days. 21 tablet 0     polyethylene glycol (MIRALAX) 17 gram packet Take 17 g by mouth daily. (Patient taking differently: Take 17 g by mouth two (2) times a day.) 30 packet 0     spironolactone (ALDACTONE) 50 MG tablet Take 1 tablet (50 mg total) by mouth daily. Taking 1/2 tablet ? (Patient taking differently: Take 0.5 tablets (25 mg total) by mouth daily. Taking 1/2 tablet ?) 30 tablet 0        Past Medical History:   Diagnosis Date    Anemia     past history, over 40 yrs ago    Cataract 2008    Surgery    Diabetes mellitus       Type II, on 9/15 patient stated she was not longer diabetic, not taking meds    Dry eye     Hearing impairment     Heart murmur 2016    [facility] Jacquiline [facility]ul [facility]    Heart valve disease     Heart mumur, pt reports diagnosis 10 yrs ago (05/10/20)    History of stent insertion of renal artery 01/29/2020    ASA indefinitely and plavix x 6 months per procedure note    Hypertension     pt reports taking meds since about 1995    Metastatic malignant neoplasm   02/22/2024    Neuromuscular disorder   2014    [facility] Leg[facility]    Tear of meniscus of knee     4.5 yrs ago    Urinary incontinence     seen a doctor about nighttime bathroom use (adressed in 2019)       Past Surgical History:   Procedure Laterality  Date    CATARACT EXTRACTION W/ INTRAOCULAR LENS IMPLANT Left 02/27/2016    EYE SURGERY  01/30/16    Cataract surgery is scheduled.    JOINT REPLACEMENT  Knee    KNEE CARTILAGE SURGERY Right 2007, 2008    x 2    KNEE SURGERY      pt reports total left knee joint replacement surgery around 2016, 2017    PLANTAR FASCIECTOMY Left     PR COLONOSCOPY FLX DX W/COLLJ SPEC WHEN PFRMD N/A 02/11/2024    Procedure: COLONOSCOPY, FLEXIBLE, PROXIMAL TO SPLENIC FLEXURE; DIAGNOSTIC, W/WO COLLECTION SPECIMEN BY BRUSH OR WASH;  Surgeon: Earl Glassing, MD;  [facility];  Service: Gastroenterology    PR REVASCULARIZE FEM/POP ARTERY,ANGIOPLASTY/STENT N/A 01/29/2020    Procedure: Peripheral Angiography W Intervetion;  Surgeon: Harvie Liner, MD;  Location: [facility] CATH;  Service: Cardiology    PR REVASCULARIZE FEM/POP ARTERY,ANGIOPLASTY/STENT Right 12/26/2021    Procedure: right renal artery intervention;  Surgeon: Harvie Liner, MD;  Location: [facility] CATH;  Service: Cardiology    PR UPPER GI ENDOSCOPY,BIOPSY N/A 04/01/2021    Procedure: UGI ENDOSCOPY; WITH BIOPSY, SINGLE OR MULTIPLE;  Surgeon: Earl Glassing, MD;  Location: [facility];  Service: Gastroenterology    PR XCAPSL CTRC RMVL INSJ IO LENS PROSTH W/O ECP Right 02/13/2016    Procedure: EXTRACAPSULAR CATARACT REMOVAL W/INSERTION OF INTRAOCULAR LENS PROSTHESIS, MANUAL OR MECHANICAL TECHNIQUE;  Surgeon: Betha Brookes, MD;  Location: [facility] OR [facility];  Service: Ophthalmology    PR XCAPSL CTRC RMVL INSJ IO LENS PROSTH W/O ECP Left 02/27/2016    Procedure: EXTRACAPSULAR CATARACT REMOVAL W/INSERTION OF INTRAOCULAR LENS PROSTHESIS, MANUAL OR MECHANICAL TECHNIQUE;  Surgeon: Betha Brookes, MD;  Location: [facility] OR [facility];  Service: Ophthalmology    RENAL ARTERY STENT  01/11/2020    ROOT CANAL      TUBAL LIGATION  1973    [facility]    WISDOM TOOTH EXTRACTION         Social History     Socioeconomic History    Marital status: Single     Spouse name: None    Number of children: None    Years of education: None    Highest education level: None   Tobacco Use    Smoking status: Former     Current packs/day: 0.00     Types: Cigarettes     Quit date: 05/19/1984     Years since quitting: 39.8     Passive exposure: Past    Smokeless tobacco: Never   Vaping Use    Vaping status: Never Used   Substance and Sexual Activity    Alcohol use: Yes     Comment: occassional    Drug use: Never    Sexual activity: Not Currently     Partners: Male   Social History Narrative    Exercise: water aerobics 3-4 x week for an hour        Eye: Dr.Merrit @ [facility] 2018        Dentist: [facility] 2018     Social Drivers of Health     Financial Resource Strain: Low Risk  (02/23/2024)    Overall Financial Resource Strain (CARDIA)     Difficulty of Paying Living Expenses: Not very hard   Recent Concern: Financial Resource Strain - Medium Risk (02/10/2024)    Overall Financial Resource Strain (CARDIA)     Difficulty of Paying Living Expenses: Somewhat hard  Food Insecurity: No Food Insecurity (02/23/2024)    Hunger Vital Sign     Worried About Running Out of Food in the Last Year: Never true     Ran Out of Food in the Last Year: Never true   Transportation Needs: No Transportation Needs (02/23/2024)    PRAPARE - Therapist, art (Medical): No     Lack of Transportation (Non-Medical): No   Physical Activity: Sufficiently Active (01/04/2023)    Exercise Vital Sign     Days of Exercise per Week: 7 days     Minutes of Exercise per Session: 30 min   Stress: No Stress Concern Present (01/04/2023)    [employer] of Occupational Health - Occupational Stress Questionnaire     Feeling of Stress : Only a little   Social Connections: Moderately Isolated (01/04/2023)    Social Connection and Isolation Panel [NHANES]     Frequency of Communication with Friends and Family: More than three times a week     Frequency of Social Gatherings with Friends and Family: More than three times a week     Attends Religious Services: Never     Database administrator or Organizations: Yes     Attends Engineer, structural: More than 4 times per year     Marital Status: Divorced   Housing: Low Risk  (02/23/2024)    Housing     Within the past 12 months, have you ever stayed: outside, in a car, in a tent, in an overnight shelter, or temporarily in someone else's home (i.e. couch-surfing)?: No     Are you worried about losing your housing?: No       Family History   Problem Relation Age of Onset    Heart disease Mother     Hypertension Mother     Dementia Mother     Arthritis Mother     Heart disease Father     Thyroid disease Father     Glaucoma Father     Alcohol abuse Father     Heart attack Father 28    Arthritis Father     Hypertension Father         Massive heart attack    Arthritis Sister     Glaucoma Sister     Hypertension Sister     Diabetes Sister     Diabetes Daughter     Diabetes Paternal Aunt     No Known Problems Brother     No Known Problems Maternal Aunt     No Known Problems Maternal Uncle     No Known Problems Paternal Uncle     No Known Problems Maternal Grandmother     No Known Problems Maternal Grandfather     No Known Problems Paternal Grandmother     No Known Problems Paternal Grandfather     Arthritis Sister         Noticeable in extremities    COPD Sister     Depression Sister     Hypertension Sister     Glaucoma Sister     Asthma Daughter         Ocassional    Hypertension Daughter     Miscarriages / Stillbirths Daughter     Cancer Maternal Aunt         Died more than 84yrs ago    Early death Maternal Aunt     Diabetes Paternal Aunt  4 Aunts    Kidney disease Paternal Aunt     Amblyopia Neg Hx     Blindness Neg Hx     Cancer Neg Hx     Cataracts Neg Hx     Macular degeneration Neg Hx     Retinal detachment Neg Hx     Strabismus Neg Hx     Stroke Neg Hx        Code Status: DNR and DNI     Review of Systems     Discussed in A/P       Objective        Temp:  [36.7 ??C (98 ??F)-36.9 ??C (98.5 ??F)] 36.8 ??C (98.2 ??F)  Pulse:  [72-79] 73  Resp:  [16-20] 20  BP: (149-153)/(73-83) 151/73  MAP (mmHg):  [92-97] 92  SpO2:  [96 %-99 %] 98 %  No intake/output data recorded.    Physical Exam:    Neurological Examination:     Mental Status: Alert, conversant, able to follow conversation and interview. Spontaneous speech was fluent without word finding pauses, dysarthria, or paraphasic errors. Comprehension was intact to simple and multi-step commands. Memory for recent and remote events was intact.    Cranial Nerves: PERRL 4 mm. VF full. Pursuit eye movements were uninterrupted with full range and without more than end-gaze nystagmus. Facial sensation intact bilaterally to light touch on the forehead, cheek, and chin. Face symmetric at rest. Normal facial movement bilaterally, including forehead, eye closure and grimace/smile. Hearing intact to conversation. Shoulder shrug full strength bilaterally. Palate movement is symmetric. Tongue protrudes midline and tongue movements are normal.    Motor Exam: Normal bulk.  No tremors, myoclonus, or other adventitious movement.  Pronator drift is absent.  UE R/L: deltoid 5/5, biceps 5/5, triceps 5/5, and hand grip strong/strong.  LE R/L: hip flexion 5/5, quadriceps 5/5, hamstrings 5/5, dorsiflexion 5/5, and plantar flexion 5/5.      Reflexes:  Deferred    Sensory: Sensation intact to light touch    Cerebellar/Coordination/Gait: Finger-to-nose is normal without ataxia or dysmetria bilaterally.              Diagnostic Studies      All Labs Last 24hrs:   Recent Results (from the past 24 hours)   Basic Metabolic Panel    Collection Time: 02/26/24  5:08 AM   Result Value Ref Range    Sodium 135 135 - 145 mmol/L    Potassium 4.0 3.4 - 4.8 mmol/L    Chloride 97 (L) 98 - 107 mmol/L    CO2 27.0 20.0 - 31.0 mmol/L    Anion Gap 11 5 - 14 mmol/L    BUN 12 9 - 23 mg/dL    Creatinine 1.66 0.63 - 1.02 mg/dL    BUN/Creatinine Ratio 15     eGFR CKD-EPI (2021) Female 75 >=60 mL/min/1.32m2    Glucose 114 70 - 179 mg/dL    Calcium 9.4 8.7 - 01.6 mg/dL   Magnesium Level    Collection Time: 02/26/24  5:08 AM   Result Value Ref Range    Magnesium 2.5 1.6 - 2.6 mg/dL   CBC    Collection Time: 02/26/24  5:08 AM   Result Value Ref Range    WBC 10.4 3.6 - 11.2 10*9/L    RBC 4.48 3.95 - 5.13 10*12/L    HGB 10.1 (L) 11.3 - 14.9 g/dL    HCT 01.0 (L) 93.2 - 44.0 %    MCV 69.5 (L) 77.6 -  95.7 fL    MCH 22.5 (L) 25.9 - 32.4 pg    MCHC 32.4 32.0 - 36.0 g/dL    RDW 16.1 (H) 09.6 - 15.2 %    MPV 6.7 (L) 6.8 - 10.7 fL    Platelet 744 (H) 150 - 450 10*9/L   TSH    Collection Time: 02/26/24  5:08 AM   Result Value Ref Range    TSH 2.142 0.550 - 4.780 uIU/mL   Hemoglobin A1c    Collection Time: 02/26/24  5:08 AM   Result Value Ref Range    Hemoglobin A1C 5.7 (H) 4.8 - 5.6 %    Estimated Average Glucose 117 mg/dL   Lipid Panel    Collection Time: 02/26/24  5:08 AM   Result Value Ref Range    Cholesterol, Total 161 <200 mg/dL    Cholesterol, HDL 40 (L) >50 mg/dL    Cholesterol, LDL, Calculated 101 (H) <100 mg/dL    Cholesterol, Non-HDL, Calculated 121 <130 mg/dL    Triglycerides 045 <409 mg/dL    Fasting         MRI personally reviewed:

## 2024-02-26 NOTE — Unmapped (Signed)
 Shift Summary   Oxycodone was administered twice during the shift, but pain levels remained constant at 6, indicating persistent pain.    The unplanned readmission score showed a slight increase, suggesting a need for further evaluation before discharge.    IV contrast was administered in the IMG [facility] department.    Peripheral IV site was assessed and maintained without complications.    Overall, pain management was not optimal, and the patient requires continued monitoring for potential readmission risks.     Optimal Comfort and Wellbeing: Pain remained constant at a level of 6 throughout the shift despite administration of oxycodone. Pain was described as aching and located in the abdomen, with no change in orientation or frequency.    Readiness for Transition of Care: The unplanned readmission score slightly increased over the shift, indicating a need for continued monitoring and assessment before transition of care.    Absence of Fall and Fall-Related Injury: Side rails were consistently kept at 3/4, and hourly visual checks confirmed the patient was in bed, reducing the risk of falls. Toileting was performed every two hours as a preventive measure.    Optimal Pain Control and Function: Despite the administration of oxycodone, pain levels remained unchanged at 6, indicating that pain control was not optimal during the shift.

## 2024-02-26 NOTE — Unmapped (Signed)
 Internal Medicine (MEDL) Progress Note    Assessment & Plan:   Kayla Knight is a 83 y.o. 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 Patient has no neuro deficits, neuro exam is grossly intact. Risk factors include HTN, pre-diabetes, small vessel disease and likely malignancy. Was previously prescribed Repatha for HLD, per daughter patient has not been taking medications for several months. Patient unsure when she last had Repatha injection (last dispensed 12/03/2023). Documented hx that statin did not control HLD in the past, unable to recall adverse symptoms related to statin.  - A1c, TSH, Lipid panel  - Holding plavix for now given bone biopsy on 6/2  - Not currently on ASA  - TTE, carotid dopplers  - Telemetry  - PT/OT re-eval  - Will resume Repatha for HLD, consider addition of statin too    Uncontrolled Abdominal Pain - RUL Mass- Adrenal Mass- Iliac and paraspinal lesions c/f malignancy   Pt evaluated by undiagnosed cancer clinic on 02/01/24 with Hematology/Oncology for concerning RUL mass. Pt had recent 40 lb weight loss and night sweats over the past 1-2 months. PET Scan 5/15 showed centrally located right upper lobe lung mass and hypermetabolic adrenal glands, R mediastinal/subcarinal lymph nodes, lytic lesions, soft tissues of the right common iliac, and left paraspinal region above L5, and in the left gluteus maximus muscle, suspicious for malignancy. PET also showed a small right pleural effusion. Some of her pain regions seem to correlate with hypermetabolic regions from PET while some of her other pain seems related to constipation. She was previously well controlled with 5mg  of oxycodone q6 hours but pain load has increased over the last 3 weeks with a functional decline. The day prior to admission, she required 10-15 mg oxycodone q4-6h for pain management. Due to inadequate pain control, outpatient IP clinic sent her to [facility] for better pain management and expedited workup for malignancy. Patient hemodynamically stable. Pain significantly controlled with bowel regimen and more frequent bowel movements. Patient's pain is currently well-controlled with regimen as below.  - Oncology consulted, appreciate recs   -  (5/31) Brain MRI without evidence of metastasis   - Agree with biopsy for tissue diagnosis, whichever site can be accessed sooner  - VIR consulted for biopsy, appreciate recs:   - Plan for vertebral bone biopsy 6/2 (currently scheduled for outpatient)   - Per VIR will need to hold Plavix for 5 days prior to bone biopsy  - Interventional pulmonology consulted; appreciate recs:   -  Scheduled for endobronchial biopsy 6/4, although will defer this if able to get the bone biopsy sooner  - Palliative care consulted, appreciate recs   - Continue duloxetine 20 mg daily for neuropathic pain  - Pain:   - Acetaminophen 1000 mg q8  - Scheduled lidocaine patch  - Oxycodone 5mg  q4 PRN moderate pain   - Dilaudid 0.5 mg q4 PRN severe pain   - IV Zofran 4 mg Q8h PRN   - IV Compazine 5 mg PRN  - Nutrition consult   - PT/OT    Opioid-induced constipation  Patient with worsening constipation and abdominal pain in the last couple weeks since starting opioids. Suspect that this is a contributor to patient's acute pain symptoms. At home, she was getting Miralax BID and mag citrate BID per daughter, with more regular BM and improved pain. She received smog enema in the ED with immediate improvement of her pain. CT Abd/Pelvis showing multiple lytic lesions corresponding to PET, otherwise no evidence of malignancy within the abdomen or pelvis. Patient's constipation and abdominal pain improving with resumption of home bowel regimen as below.  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 BP on admission is elevated 165/72 likely secondary to uncontrolled pain. Last dose of plavix 5/27 in AM.   - Amlodipine 10 mg daily   - Spironolactone 25 mg daily   - Hold Plavix 75 mg prior to procedure   - BMP daily     Mobility Concerns:  The beneficiary has a mobility limitation that significantly impairs his/her ability to participate in one or more mobility related activities of daily living in the home.   Has a mobility limitation that significantly impairs his/her ability to participate in one or more mobility-related activities of daily living (MRADLs) ?? Has a mobility limitation that place the patient at reasonably determined heightened risk of morbidity or mortality secondary to the attempts to perform the MRADL and prevents the patient from completing the MRADL within a reasonable time frame. ??The patient is able to safely use the rollator. ??The functional mobility deficit can be sufficiently resolved with the use of a rollator.   The patient is confined to one level of the home with no toilet on that level OR     Chronic Problems  T2DM  Well controlled w/o medications. Most recent A1c 5.7 on 10/22.  - CTM    HLD  Continue home evolocumab     Diverticulitis  Recent colonoscopy on 02/11/2024 showed 6 mm polyp, diverticulosis without malignancy, active bleeding.   - CTM      Asthma   - Albuterol Q6h PRN       The patient's presentation is complicated by the following clinically significant conditions requiring additional evaluation and treatment: - Age related debility POA requiring additional resources: DME, PT, or OT  - Metastatic cancer POA requiring further investigation, treatment, or monitoring    Issues Impacting Complexity of Management:  -High risk of complications from pain and/or analgesia likely to result in delirium      Medical Decision Making: Discussed the patient's management and/or test interpretation with Pulmonology and interventional radiology as summarized within this note      Daily Checklist:  Diet:  Regular Diet  DVT PPx: Lovenox 40mg  q24h  Electrolytes: Replete Potassium to >/= 3.6 and Magnesium to >/= 1.8  Code Status: DNR and DNI  Dispo: Continue floor care    Team Contact Information:   Primary Team: Internal Medicine (MEDL)  Primary Resident: Georjean Kite, MD, MD  Resident's Pager: [phone] (Gen Med[facility] Intern - Tower)    Interval History:   No acute events overnight. Patient resting comfortably in bed this AM in good spirits. Endorses 1-3 loose bowel movements a day. Also having mild discomfort with walking but ambulating okay.    All other systems were reviewed and are negative except as noted in the HPI    Objective:   Temp:  [36.7 ??C (98 ??F)-36.9 ??C (98.5 ??F)] 36.8 ??C (98.2 ??F)  Pulse:  [72-79] 73  Resp:  [16-20] 20  BP: (149-153)/(73-83) 151/73  SpO2:  [96 %-99 %] 98 %    Gen: NAD, appears chronically ill  HENT: atraumatic, normocephalic  Heart: RRR  Lungs: CTAB, no crackles or wheezes  Abdomen: soft, non-tender and non-distended  Extremities: No edema  Neuro: Equal strength in upper and lower extremities bilaterally. No focal neural deficits.     Jabier Martens, MD  PGY-1 Internal Medicine

## 2024-02-27 LAB — BASIC METABOLIC PANEL
ANION GAP: 11 mmol/L (ref 5–14)
BLOOD UREA NITROGEN: 10 mg/dL (ref 9–23)
BUN / CREAT RATIO: 13
CALCIUM: 9.5 mg/dL (ref 8.7–10.4)
CHLORIDE: 98 mmol/L (ref 98–107)
CO2: 26 mmol/L (ref 20.0–31.0)
CREATININE: 0.75 mg/dL (ref 0.55–1.02)
EGFR CKD-EPI (2021) FEMALE: 80 mL/min/1.73m2 (ref >=60)
GLUCOSE RANDOM: 116 mg/dL (ref 70–179)
POTASSIUM: 4.2 mmol/L (ref 3.4–4.8)
SODIUM: 135 mmol/L (ref 135–145)

## 2024-02-27 LAB — CBC
HEMATOCRIT: 30.3 % — ABNORMAL LOW (ref 34.0–44.0)
HEMOGLOBIN: 10 g/dL — ABNORMAL LOW (ref 11.3–14.9)
MEAN CORPUSCULAR HEMOGLOBIN CONC: 33.2 g/dL (ref 32.0–36.0)
MEAN CORPUSCULAR HEMOGLOBIN: 23.3 pg — ABNORMAL LOW (ref 25.9–32.4)
MEAN CORPUSCULAR VOLUME: 70.2 fL — ABNORMAL LOW (ref 77.6–95.7)
MEAN PLATELET VOLUME: 6.6 fL — ABNORMAL LOW (ref 6.8–10.7)
PLATELET COUNT: 741 10*9/L — ABNORMAL HIGH (ref 150–450)
RED BLOOD CELL COUNT: 4.32 10*12/L (ref 3.95–5.13)
RED CELL DISTRIBUTION WIDTH: 16.8 % — ABNORMAL HIGH (ref 12.2–15.2)
WBC ADJUSTED: 11 10*9/L (ref 3.6–11.2)

## 2024-02-27 LAB — MAGNESIUM: MAGNESIUM: 2.4 mg/dL (ref 1.6–2.6)

## 2024-02-27 MED ADMIN — oxyCODONE (ROXICODONE) immediate release tablet 10 mg: 10 mg | ORAL | @ 07:00:00 | Stop: 2024-03-08

## 2024-02-27 MED ADMIN — magnesium citrate solution 148 mL: 148 mL | ORAL | @ 14:00:00 | Stop: 2024-02-27

## 2024-02-27 MED ADMIN — HYDROmorphone (PF) (DILAUDID) injection 1 mg: 1 mg | INTRAVENOUS | @ 13:00:00 | Stop: 2024-03-07

## 2024-02-27 MED ADMIN — acetaminophen (TYLENOL) tablet 1,000 mg: 1000 mg | ORAL | @ 10:00:00

## 2024-02-27 MED ADMIN — ondansetron (ZOFRAN-ODT) disintegrating tablet 4 mg: 4 mg | ORAL | @ 22:00:00

## 2024-02-27 MED ADMIN — oxyCODONE (ROXICODONE) immediate release tablet 10 mg: 10 mg | ORAL | @ 01:00:00 | Stop: 2024-03-08

## 2024-02-27 MED ADMIN — oxyCODONE (ROXICODONE) immediate release tablet 10 mg: 10 mg | ORAL | @ 21:00:00 | Stop: 2024-03-08

## 2024-02-27 MED ADMIN — senna (SENOKOT) tablet 2 tablet: 2 | ORAL | @ 01:00:00

## 2024-02-27 MED ADMIN — DULoxetine (CYMBALTA) DR capsule 20 mg: 20 mg | ORAL | @ 14:00:00

## 2024-02-27 MED ADMIN — spironolactone (ALDACTONE) tablet 25 mg: 25 mg | ORAL | @ 14:00:00

## 2024-02-27 MED ADMIN — multivitamins, therapeutic with minerals tablet 1 tablet: 1 | ORAL | @ 14:00:00

## 2024-02-27 MED ADMIN — acetaminophen (TYLENOL) tablet 1,000 mg: 1000 mg | ORAL | @ 18:00:00

## 2024-02-27 MED ADMIN — polyethylene glycol (MIRALAX) packet 17 g: 17 g | ORAL | @ 01:00:00

## 2024-02-27 MED ADMIN — oxyCODONE (ROXICODONE) immediate release tablet 10 mg: 10 mg | ORAL | @ 13:00:00 | Stop: 2024-03-08

## 2024-02-27 MED ADMIN — enoxaparin (LOVENOX) syringe 40 mg: 40 mg | SUBCUTANEOUS | @ 01:00:00

## 2024-02-27 MED ADMIN — acetaminophen (TYLENOL) tablet 1,000 mg: 1000 mg | ORAL | @ 01:00:00

## 2024-02-27 MED ADMIN — polyethylene glycol (MIRALAX) packet 17 g: 17 g | ORAL | @ 14:00:00

## 2024-02-27 MED ADMIN — amlodipine (NORVASC) tablet 10 mg: 10 mg | ORAL | @ 14:00:00

## 2024-02-27 NOTE — Unmapped (Signed)
 Shift Summary   Oxycodone was administered twice during the shift to manage abdominal pain, but pain levels slightly increased.    The patient refused magnesium citrate, which may impact bowel management.    Family visits were consistent, providing emotional support and potentially contributing to the patient's comfort.    The patient was observed sleeping at multiple intervals, indicating periods of rest and comfort.    Overall, the patient's condition remained stable with no falls or hospital-acquired conditions, but pain management remains a challenge.     Absence of Hospital-Acquired Illness or Injury: No signs of hospital-acquired illness or injury were noted during the shift, and the peripheral IV site remained clean, dry, and intact with no intervention needed.     Optimal Comfort and Wellbeing: Family members visited consistently throughout the shift, and the patient was observed sleeping at multiple intervals, indicating periods of rest.     Readiness for Transition of Care: The unplanned readmission score showed a slight decrease, suggesting a stable condition with no immediate concerns for readmission.     Absence of Fall and Fall-Related Injury: Fall risk interventions were maintained, including bed alarms and hourly visual checks, with no falls or injuries reported.     Optimal Pain Control and Function: Pain remained constant and slightly increased from a 5 to a 6 on the pain scale despite administration of oxycodone, indicating ongoing challenges in achieving optimal pain control.

## 2024-02-27 NOTE — Unmapped (Signed)
 Shift Summary   Pain level remained at 10 throughout the shift despite administration of pain medications.    Regular toileting and visual checks were conducted to prevent falls, with no incidents reported.    Pain interventions included PRN Oxycodone and HYDROmorphone (PF) administration.    Patient was off the unit briefly during the shift.    Overall, pain management was not effective, and fall prevention measures were successful.   MD notified of the following: patient removes telemetry and pulse-ox monitoring every times she gets OOB to use the bathroom, for shower, and for a walk.  RN provided education that monitoring must be continuous and should only be removed for Off-the-unit tests or for special circumstances.  Meanwhile, no alarms from telemetry.  Anticipate removal of monitoring tonight at 2000 per discussion with MD.      Optimal Comfort and Wellbeing: Pain reported as level of 10 early this morning and improved with use of PRN Oxycodone and HYDROmorphone (PF), with no change in pain descriptors or location.     Absence of Fall and Fall-Related Injury: Regular toileting every two hours and hourly visual checks were maintained, with no falls or injuries reported during the shift.     Optimal Pain Control and Function: Pain interventions, including PRN Oxycodone and HYDROmorphone (PF), were administered.    Problem: Adult Inpatient Plan of Care  Goal: Absence of Hospital-Acquired Illness or Injury  Intervention: Identify and Manage Fall Risk  Recent Flowsheet Documentation  Taken 02/27/2024 1400 by Mohd. Derflinger K, RN  Safety Interventions:   low bed   fall reduction program maintained   family at bedside   bed alarm   mobility aid   nonskid shoes/slippers when out of bed   supervised activity   toileting scheduled      Goal: Optimal Comfort and Wellbeing  Outcome: Shift Focus     Problem: Fall Injury Risk  Goal: Absence of Fall and Fall-Related Injury  Outcome: Shift Focus  Intervention: Promote Injury-Free Environment  Recent Flowsheet Documentation  Taken 02/27/2024 1400 by Imir Brumbach K, RN  Safety Interventions:   low bed   fall reduction program maintained   family at bedside   bed alarm   mobility aid   nonskid shoes/slippers when out of bed   supervised activity   toileting scheduled       Problem: Pain Acute  Goal: Optimal Pain Control and Function  Outcome: Shift Focus

## 2024-02-27 NOTE — Unmapped (Signed)
 Internal Medicine (MEDL) Progress Note    Assessment & Plan:   Kayla Knight is a 83 y.o. 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 Patient has no neuro deficits, neuro exam is grossly intact. Risk factors include HTN, pre-diabetes, small vessel disease and likely malignancy. Was previously prescribed Repatha for HLD, per daughter patient has not been taking medications for several months. Patient unsure when she last had Repatha injection (last dispensed 12/03/2023). Documented hx that statin did not control HLD in the past, unable to recall adverse symptoms related to statin. Neuro consulted on 5/31, appreciate recommendations.   - A1c, TSH, Lipid panel  - Holding plavix for now given bone biopsy on 6/2  - Not currently on ASA  - TTE, carotid dopplers  - Telemetry  - PT/OT re-eval on 6/2  - Will resume Repatha for HLD, consider addition of statin too    Uncontrolled Abdominal Pain - RUL Mass- Adrenal Mass- Iliac and paraspinal lesions c/f malignancy   Pt evaluated by undiagnosed cancer clinic on 02/01/24 with Hematology/Oncology for concerning RUL mass. Pt had recent 40 lb weight loss and night sweats over the past 1-2 months. PET Scan 5/15 showed centrally located right upper lobe lung mass and hypermetabolic adrenal glands, R mediastinal/subcarinal lymph nodes, lytic lesions, soft tissues of the right common iliac, and left paraspinal region above L5, and in the left gluteus maximus muscle, suspicious for malignancy. PET also showed a small right pleural effusion. Some of her pain regions seem to correlate with hypermetabolic regions from PET while some of her other pain seems related to constipation. She was previously well controlled with 5mg  of oxycodone q6 hours but pain load has increased over the last 3 weeks with a functional decline. The day prior to admission, she required 10-15 mg oxycodone q4-6h for pain management. Due to inadequate pain control, outpatient IP clinic sent her to [facility] for better pain management and expedited workup for malignancy. Patient hemodynamically stable. Pain significantly controlled with bowel regimen and more frequent bowel movements. Patient's pain is currently well-controlled with regimen as below.  - Oncology consulted, appreciate recs   -  (5/31) Brain MRI without evidence of metastasis   - Agree with biopsy for tissue diagnosis, whichever site can be accessed sooner  - VIR consulted for biopsy, appreciate recs:   - Plan for vertebral bone biopsy 6/2 (currently scheduled for outpatient) reached out to VIR to confirm biopsy for 6/2 while hospitalized.    - Per VIR will  need to hold Plavix for 5 days prior to bone biopsy  - Interventional pulmonology consulted; appreciate recs:   - Scheduled for endobronchial biopsy 6/4, although will defer this if able to get the bone biopsy sooner  - Palliative care consulted, appreciate recs   - Continue duloxetine 20 mg daily for neuropathic pain  - Pain:   - Acetaminophen 1000 mg q8  - Scheduled lidocaine patch  - Oxycodone 5mg  q4 PRN moderate pain   - Dilaudid 0.5 mg q4 PRN severe pain   - IV Zofran 4 mg Q8h PRN   - IV Compazine 5 mg PRN  - Nutrition consult   - PT/OT    Opioid-induced constipation  Patient with worsening constipation and abdominal pain in the last couple weeks since starting opioids. Suspect that this is a contributor to patient's acute pain symptoms. At home, she was getting Miralax BID and mag citrate BID per daughter, with more regular BM and improved pain. She received smog enema in the ED with immediate improvement of her pain. CT Abd/Pelvis showing multiple lytic lesions corresponding to PET, otherwise no evidence of malignancy within the abdomen or pelvis. Patient's constipation and abdominal pain improving with resumption of home bowel regimen as below.  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 BP on admission is elevated 165/72 likely secondary to uncontrolled pain. Last dose of plavix 5/27 in AM.   - Amlodipine 10 mg daily   - Spironolactone 25 mg daily   - Hold Plavix 75 mg prior to procedure   - BMP daily     Mobility Concerns:  The beneficiary has a mobility limitation that significantly impairs his/her ability to participate in one or more mobility related activities of daily living in the home.   Has a mobility limitation that significantly impairs his/her ability to participate in one or more mobility-related activities of daily living (MRADLs) ?? Has a mobility limitation that place the patient at reasonably determined heightened risk of morbidity or mortality secondary to the attempts to perform the MRADL and prevents the patient from completing the MRADL within a reasonable time frame. ??The patient is able to safely use the rollator. ??The functional mobility deficit can be sufficiently resolved with the use of a rollator.   The patient is confined to one level of the home with no toilet on that level OR   - Reevaluate PT/OT on 6/2    Chronic Problems  T2DM  Well controlled w/o medications. Most recent A1c 5.7 on 10/22.  - CTM    HLD  Continue home evolocumab     Diverticulitis  Recent colonoscopy on 02/11/2024 showed 6 mm polyp, diverticulosis without malignancy, active bleeding.   - CTM      Asthma   - Albuterol Q6h PRN       The patient's presentation is complicated by the following clinically significant conditions requiring additional evaluation and treatment: - Age related debility POA requiring additional resources: DME, PT, or OT  - Metastatic cancer POA requiring further investigation, treatment, or monitoring    Issues Impacting Complexity of Management:  -High risk of complications from pain and/or analgesia likely to result in delirium      Medical Decision  Making: Discussed the patient's management and/or test interpretation with Pulmonology and interventional radiology as summarized within this note      Daily Checklist:  Diet: Regular Diet  DVT PPx: Lovenox 40mg  q24h  Electrolytes: Replete Potassium to >/= 3.6 and Magnesium to >/= 1.8  Code Status: DNR and DNI  Dispo: Goal Discharge: Home w/ HH on 6/2 post biopsy    Team Contact Information:   Primary Team: Internal Medicine (MEDL)  Primary Resident: Daleen Dubs, MD  Resident's Pager: [phone] [facility] Senior Resident)    Interval History:   No acute events overnight. Tolerated carotid doppler ultrasound well. Pain is well controlled. Has no other concerns right now.     All other systems were reviewed and are negative except as noted in the HPI    Objective:   Temp:  [36.7 ??C (98 ??F)-37.4 ??C (99.3 ??F)] 36.7 ??C (98 ??F)  Pulse:  [73-82] 79  Resp:  [18-20] 20  BP: (155-178)/(75-80) 160/80  SpO2:  [97 %-99 %] 97 %    Gen: NAD, laying in bed  HENT: atraumatic, normocephalic  Heart: RRR, systolic ejection murmur prominent at RUSB   Lungs: CTAB, no crackles or wheezes  Abdomen: soft, non-tender and non-distended  Extremities: No edema  Neuro: No focal deficits    Daleen Dubs, MD  [facility] Internal Medicine, PGY-2

## 2024-02-28 LAB — CBC
HEMATOCRIT: 31.8 % — ABNORMAL LOW (ref 34.0–44.0)
HEMOGLOBIN: 10.5 g/dL — ABNORMAL LOW (ref 11.3–14.9)
MEAN CORPUSCULAR HEMOGLOBIN CONC: 33 g/dL (ref 32.0–36.0)
MEAN CORPUSCULAR HEMOGLOBIN: 23.1 pg — ABNORMAL LOW (ref 25.9–32.4)
MEAN CORPUSCULAR VOLUME: 70 fL — ABNORMAL LOW (ref 77.6–95.7)
MEAN PLATELET VOLUME: 6.5 fL — ABNORMAL LOW (ref 6.8–10.7)
PLATELET COUNT: 761 10*9/L — ABNORMAL HIGH (ref 150–450)
RED BLOOD CELL COUNT: 4.54 10*12/L (ref 3.95–5.13)
RED CELL DISTRIBUTION WIDTH: 16.6 % — ABNORMAL HIGH (ref 12.2–15.2)
WBC ADJUSTED: 9.7 10*9/L (ref 3.6–11.2)

## 2024-02-28 LAB — BASIC METABOLIC PANEL
ANION GAP: 7 mmol/L (ref 5–14)
BLOOD UREA NITROGEN: 12 mg/dL (ref 9–23)
BUN / CREAT RATIO: 15
CALCIUM: 10 mg/dL (ref 8.7–10.4)
CHLORIDE: 99 mmol/L (ref 98–107)
CO2: 27 mmol/L (ref 20.0–31.0)
CREATININE: 0.79 mg/dL (ref 0.55–1.02)
EGFR CKD-EPI (2021) FEMALE: 75 mL/min/1.73m2 (ref >=60)
GLUCOSE RANDOM: 106 mg/dL (ref 70–179)
POTASSIUM: 4.6 mmol/L (ref 3.4–4.8)
SODIUM: 133 mmol/L — ABNORMAL LOW (ref 135–145)

## 2024-02-28 LAB — MAGNESIUM: MAGNESIUM: 2.5 mg/dL (ref 1.6–2.6)

## 2024-02-28 MED ORDER — SENNOSIDES 8.6 MG TABLET
ORAL_TABLET | Freq: Every evening | ORAL | 0 refills | 15.00000 days | PRN
Start: 2024-02-28 — End: ?

## 2024-02-28 MED ORDER — POLYETHYLENE GLYCOL 3350 17 GRAM ORAL POWDER PACKET
PACK | Freq: Two times a day (BID) | ORAL | 0 refills | 30.00000 days
Start: 2024-02-28 — End: 2024-03-29

## 2024-02-28 MED ORDER — BISACODYL 10 MG RECTAL SUPPOSITORY
Freq: Every day | RECTAL | 0 refills | 12.00000 days | PRN
Start: 2024-02-28 — End: 2024-03-29

## 2024-02-28 MED ADMIN — DULoxetine (CYMBALTA) DR capsule 20 mg: 20 mg | ORAL | @ 14:00:00

## 2024-02-28 MED ADMIN — spironolactone (ALDACTONE) tablet 25 mg: 25 mg | ORAL | @ 14:00:00

## 2024-02-28 MED ADMIN — acetaminophen (TYLENOL) tablet 1,000 mg: 1000 mg | ORAL | @ 10:00:00

## 2024-02-28 MED ADMIN — acetaminophen (TYLENOL) tablet 1,000 mg: 1000 mg | ORAL | @ 01:00:00

## 2024-02-28 MED ADMIN — oxyCODONE (ROXICODONE) immediate release tablet 15 mg: 15 mg | ORAL | @ 14:00:00 | Stop: 2024-03-08

## 2024-02-28 MED ADMIN — amlodipine (NORVASC) tablet 10 mg: 10 mg | ORAL | @ 14:00:00

## 2024-02-28 MED ADMIN — HYDROmorphone (PF) (DILAUDID) injection 1 mg: 1 mg | INTRAVENOUS | @ 10:00:00 | Stop: 2024-03-07

## 2024-02-28 MED ADMIN — acetaminophen (TYLENOL) tablet 1,000 mg: 1000 mg | ORAL | @ 18:00:00

## 2024-02-28 MED ADMIN — multivitamins, therapeutic with minerals tablet 1 tablet: 1 | ORAL | @ 14:00:00

## 2024-02-28 NOTE — Unmapped (Signed)
 Follow-up Consult Note        Requesting Attending Physician:  Laurence Pons, MD  Service Requesting Consult: [facility])     Assessment and Plan     Kayla Knight is a 83 y.o. ambidextrous female on whom I have been asked by Princess Brooks, MD to consult for stroke seen on MRI.     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 FLAIR with extensive small vessel disease.  Patient reports that she has no focal deficits, though patient is generally weak and was here for pain management.  Given no clinical symptoms, unclear last known normal. We would estimate that the infarct is in the order of days to a week.  Reassuringly, she has no clear deficits and her exam is intact.  Given the location of the infarct, it is most likely caused by small vessel disease with known resistant hypertension, diabetes, and hyperlipidemia. Repeat A1C 5.7 and LDL 101, 5/31. Although not asked, appears patient has quit smoking in 1985. However, given her malignancy, completed stroke workup with echocardiogram and vessel imaging. PVL carotids with < 50% stenosis on bilateral ICAs. TTE, wnl. She already is on Plavix (Per primary team: indication for renal artery stent and must be on plavix and not asprin). Although plavix was held since 5/28 for possible bone biopsy, her stroke could still be prior to holding plavix. Recommend plavix genotyping to rule out plavix resistance.     RECOMMENDATIONS:   Continue Plavix 75 mg daily when able (Primary team confirmed that patient has specific indication for plavix from renal artery stent standpoint)  Send Plavix respond genotyping (please page Neurology if positive)   Resume atorvastatin 40 mg daily as able   Goal normotension    We will sign off at this time. Please re-consult if further neurologic questions arise. This patient was seen and discussed with Dr. Dow Gemma, who agrees with the above assessment and plan.      Vale Garrison, MD, PhD  PGY-2, Neurology     Subjective     Reason for Consult: stroke on MRI     Interval history 6/2:   - Patient exam stable and intact. Conversant. States that she had more brusing with asprin years ago (I.e. prior to renal artery stenting) and that is the reason she is only on plavix.   - Plavix held since 5/28 for procedure     Initial HPI:   Patient initially presented to the hospital because of worsening abdominal pain and was admitted for expedited oncological workup as well as IV pain management.     She reports that she had no focal neurological deficits, including weakness, numbness, or difficulty speaking.     At this time, she has been being worked up for a right upper lobe mass along with weight loss.  In addition, she has had persistent hypertension, hyperlipidemia, and diabetes.     While undergoing malignancy workup with oncology, MRI brain was pursued to look for metastases but an acute punctate infarct was incidentally found, for which neurology was consulted.              Objective        Temp:  [36.8 ??C (98.2 ??F)-37.2 ??C (99 ??F)] 37.2 ??C (99 ??F)  Pulse:  [72-84] 72  Resp:  [18] 18  BP: (142-159)/(69-77) 159/77  MAP (mmHg):  [92-98] 98  SpO2:  [90 %-97 %] 90 %  No intake/output data recorded.    Physical Exam:  General Appearance:Well appearing. In no acute distress.  HEENT: Head is atraumatic and normocephalic. Sclera anicteric without injection. Oropharyngeal membranes are moist with no erythema or exudate.  Neck: Deferred.  Lungs: Normal work of breathing.  Heart: Warm and well perfused   Abdomen: Nondistended.  Extremities: No clubbing, cyanosis, or edema.    Neurological Examination:     Mental Status: Alert, conversant, able to follow conversation and interview. Spontaneous speech was fluent without word finding pauses, dysarthria, or paraphasic errors. Comprehension was intact. Memory for recent and remote events was intact.    Cranial Nerves: PERRL. Pursuit eye movements were uninterrupted with full range and without more than end-gaze nystagmus. Facial sensation intact bilaterally to light touch in all three divisions of CNV. Face symmetric at rest. Normal facial movement bilaterally, including forehead, eye closure and grimace/smile. Hearing intact to conversation. Shoulder shrug full strength bilaterally. Palate movement is symmetric. Tongue protrudes midline and tongue movements are normal.     Motor Exam: Normal bulk.  No tremors, myoclonus, or other adventitious movement.  Pronator drift is absent.  UE R/L: deltoid 5/5, biceps 5/5, triceps 5/5, wrist flexion 5/5, and wrist extension 5/5.  LE R/L: hip flexion 5/5, hip extension 5/5, quadriceps 5/5, hamstrings 5/5, dorsiflexion 5/5, and plantar flexion 5/5.    Reflexes:  Toes are downgoing bilaterally.    Sensory: Intact and symmetric to light touch BUE and BLE.      Cerebellar/Coordination/Gait: Finger-to-nose is normal without ataxia or dysmetria bilaterally. Heel-to-shin is normal without ataxia or dysmetria bilaterally. Gait exam: deferred.     Medications:  Scheduled medications:    acetaminophen  1,000 mg Oral Q8H    amlodipine  10 mg Oral Daily    [Provider Hold] clopidogrel  75 mg Oral Daily    DULoxetine  20 mg Oral Daily    enoxaparin (LOVENOX) injection  40 mg Subcutaneous Q24H    lidocaine  1 patch Transdermal Daily    lidocaine  1 patch Transdermal Daily    lidocaine  1 patch Transdermal Daily    magnesium citrate  148 mL Oral daily    multivitamins (ADULT)  1 tablet Oral Daily    polyethylene glycol  17 g Oral BID    spironolactone  25 mg Oral Daily     Continuous infusions:   PRN medications: albuterol, aluminum-magnesium hydroxide-simethicone, bisacodyl, dextrose in water, glucagon, glucose, guaiFENesin, HYDROmorphone, melatonin, naloxone, ondansetron, oxyCODONE **OR** oxyCODONE, prochlorperazine **OR** prochlorperazine, senna     Diagnostic Studies      All Labs Last 24hrs:   Recent Results (from the past 24 hours)   Basic Metabolic Panel    Collection Time: 02/28/24  8:36 AM   Result Value Ref Range    Sodium 133 (L) 135 - 145 mmol/L    Potassium 4.6 3.4 - 4.8 mmol/L    Chloride 99 98 - 107 mmol/L    CO2 27.0 20.0 - 31.0 mmol/L    Anion Gap 7 5 - 14 mmol/L    BUN 12 9 - 23 mg/dL    Creatinine 1.61 0.96 - 1.02 mg/dL    BUN/Creatinine Ratio 15     eGFR CKD-EPI (2021) Female 75 >=60 mL/min/1.25m2    Glucose 106 70 - 179 mg/dL    Calcium 04.5 8.7 - 40.9 mg/dL   Magnesium Level    Collection Time: 02/28/24  8:36  AM   Result Value Ref Range    Magnesium 2.5 1.6 - 2.6 mg/dL   CBC    Collection Time: 02/28/24  8:36 AM   Result Value Ref Range    WBC 9.7 3.6 - 11.2 10*9/L    RBC 4.54 3.95 - 5.13 10*12/L    HGB 10.5 (L) 11.3 - 14.9 g/dL    HCT 16.1 (L) 09.6 - 44.0 %    MCV 70.0 (L) 77.6 - 95.7 fL    MCH 23.1 (L) 25.9 - 32.4 pg    MCHC 33.0 32.0 - 36.0 g/dL    RDW 04.5 (H) 40.9 - 15.2 %    MPV 6.5 (L) 6.8 - 10.7 fL    Platelet 761 (H) 150 - 450 10*9/L

## 2024-02-28 NOTE — Unmapped (Signed)
 Interventional Pulmonary Initial Consult Note     Date of Service: 02/28/2024  Requesting Physician: Laurence Pons, MD   Requesting Service: [facility])  Reason for consultation: Comprehensive evaluation of abnormal thoracic imaging.    Assessment      Interval Events:Comfortable. Plan for VIR bipsy 02/28/2024       Impression:  KaylaKnight is a 83 y.o.female admitted for expedited malignancy evaluation and supportive care in the context of recent discovery of PET avid RUL mass with associated mediastinal and subcarinal lymphadenopathy as well as lytic lesion and R pleural effusion concerning for metastatic disease. Will follow results of VIR biopsy and determine if additional EBUS staging indicated.       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      This patient was seen and evaluated with Dr.DeMarco. The recommendations outlined in this note were discussed w the primary team via epic chat. Please page the Interventional Pulmonology pager at [phone] with any questions and notify the IP service with discharge plans to ensure the patient has appropriate IP follow up. We appreciate the opportunity to assist in the care of this patient. We will sign off at this time.    Fredirick Jasmine, MD    Subjective & Objective     Hospital Problems:  Principal Problem:    Uncontrolled pain  Active Problems:    Benign essential hypertension    Type 2 diabetes mellitus, without long-term current use of insulin      Renal artery stenosis, native, bilateral (CMS-HCC)    Hyperlipidemia, unspecified    Nonrheumatic aortic valve insufficiency    S/P cataract extraction and insertion of intraocular lens, left    Malignant neoplasm of upper lobe of right lung      Drug-induced constipation      HPI: Kayla Knight is a 83 y.o. female with history of HTN, T2DM, HLD who was admitted for expedited malignancy workup and management of cancer-related pain.    She was initially seen 01/21/24 in the ER for abdominal pain where Xrays showed bony lesions in the R inferior pubic ramus, L posterior medial rib, iliac crest. She was seen in undiagnosed cancer  clinic where PET scan was ordered and she was referred to us .      History provided by patient and her family - daughter, grandson, granddaughter. She has had a rapid decline over 3 weeks, was previously living independently (prepared her own meals, was making quilts for her great granddaughter). In the last week, has moved in with her daughter and was living on her couch until they were able to set up her bed in their living room.     She has had ongoing dyspnea and has been relying on her inhalers - using mostly at nights - with relief. She has lost more than 20lbs in the last few months.      Her paid has rapidly escalated and she is now doubling and tripling her doses of oxycodone at home with lessening effect. She has pain in her abdomen, thighs, hips and back. Oxycodone does help but has been difficult to tolerate due to nausea and constipation which they are now managing with mag citrate and miralax.  She smoked ~1-2 packs per week for 30-33yrs, quit 20 yrs ago. She denies E cig or vaping use. Maternal aunt with uterine cancer but no other lung cancer or other cancer diagnosis in family. 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     Problems addressed during this consult include abnormal imaging, concern for malignancy. Based on these problems, the patient has high risk of morbidity/mortality which is commensurate w their risk of management options described below in the recommendations.      Vitals - past 24 hours  Temp:  [36.7 ??C (98 ??F)-37.2 ??C (99 ??F)] 37.2 ??C (99 ??F)  Pulse:  [72-84] 72  Resp:  [18-20] 18  BP: (142-160)/(69-80) 159/77  SpO2:  [90 %-97 %] 90 % Intake/Output  I/O last 3 completed shifts:  In: 120 [P.O.:120]  Out: 800 [Urine:800]      Pertinent exam findings:    General: NAD, pleasant, In Bed  HEENT: MOM  Respiratory: NWOB on room air, no wheezing  Cardiovascular: RRR  Extremities: Moves all extremities well, no clubbing   Integumentary:  no rashes or lesions  Neurologic: Alert & Oriented   Psychiatric: Normal mood and affect      Pertinent Imaging Data     PET CT 02/10/24  CTA 11/12/23 - RLL nodule but otherwise no effusions or other nodules compared to PET CT:         Pertinent Micro Data:  Reviewed    Risk Factors:  Prior pleural drainage or thoracic surgery? no  Full dose AC? no  Any anti-platelets in last 5 days besides ASA 81? yes    Current vent settings:       Arterial Blood Gas:   No results for input(s): SPECTYPEART, PHART, PCO2ART, PO2ART, HCO3ART, BEART, O2SATART in the last 24 hours.     Venous Blood Gas:   No results for input(s): PHVEN, PCO2VEN, PO2VEN, HCO3VEN, BEVEN, O2SATVEN in the last 24 hours.     Cultures:  Urine Culture, Comprehensive (no units)   Date Value   02/23/2024 Mixed Urogenital Flora   02/18/2024 (A)    10,000 to 50,000 CFU/mL Streptococcus agalactiae (group b)   02/18/2024 10,000 to 50,000 CFU/mL Mixed Urogenital Flora (A)     WBC (10*9/L)   Date Value   02/27/2024 11.0     WBC, UA (/HPF)   Date Value   02/23/2024 14 (H)          Other Labs:  Lab Results   Component Value Date    WBC 11.0 02/27/2024    HGB 10.0 (L) 02/27/2024    HCT 30.3 (L) 02/27/2024    PLT 741 (H) 02/27/2024     Lab Results   Component Value Date    NA 135 02/27/2024    K 4.2 02/27/2024    CL 98 02/27/2024    CO2 26.0 02/27/2024    BUN 10 02/27/2024    CREATININE 0.75 02/27/2024    GLU 116 02/27/2024    CALCIUM 9.5 02/27/2024    MG 2.4 02/27/2024    PHOS 3.0 02/17/2024     Lab Results   Component Value Date    BILITOT 0.3 02/22/2024    BILIDIR <0.10 02/22/2024    PROT 7.6 02/22/2024    ALBUMIN 3.1 (L) 02/22/2024    ALT 26 02/22/2024    AST 25 02/22/2024    ALKPHOS 172 (H) 02/22/2024     Lab Results   Component  Value Date    INR 1.03 02/22/2024    APTT 28.3 02/22/2024       Allergies & Home Medications   Personally reviewed in Epic    Continuous Infusions:       Scheduled Medications:    acetaminophen  1,000 mg Oral Q8H    amlodipine  10 mg Oral Daily    [Provider Hold] clopidogrel  75 mg Oral Daily    DULoxetine  20 mg Oral Daily    enoxaparin (LOVENOX) injection  40 mg Subcutaneous Q24H    lidocaine  1 patch Transdermal Daily    lidocaine  1 patch Transdermal Daily    lidocaine  1 patch Transdermal Daily    magnesium citrate  148 mL Oral daily    multivitamins (ADULT)  1 tablet Oral Daily    polyethylene glycol  17 g Oral BID    spironolactone  25 mg Oral Daily       PRN medications:  albuterol, aluminum-magnesium hydroxide-simethicone, bisacodyl, dextrose in water, glucagon, glucose, guaiFENesin, HYDROmorphone, melatonin, naloxone, ondansetron, oxyCODONE **OR** oxyCODONE, prochlorperazine **OR** prochlorperazine, senna

## 2024-02-28 NOTE — Unmapped (Signed)
 Internal Medicine ([facility]) Progress Note    Assessment & Plan:   Kayla Knight is a 83 y.o. 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 Patient has no neuro deficits, neuro exam is grossly intact. Risk factors include HTN, pre-diabetes, small vessel disease and likely malignancy. Was previously prescribed Repatha for HLD, per daughter patient has not been taking medications for several months. Patient unsure when she last had Repatha injection (last dispensed 12/03/2023). Documented hx that statin did not control HLD in the past, unable to recall adverse symptoms related to statin. Neuro consulted on 5/31, appreciate recommendations.   - A1c, TSH, Lipid panel  - Holding plavix for now given bone biopsy on 6/2  - Not currently on ASA  - TTE -ve, carotid dopplers < 50% stenosis bilaterally  - Telemetry  - Will resume Repatha for HLD, consider addition of statin too  - Plavix genotype pending given stroke while on plavix      Uncontrolled Abdominal Pain - RUL Mass- Adrenal Mass- Iliac and paraspinal lesions c/f malignancy   Pt evaluated by undiagnosed cancer clinic on 02/01/24 with Hematology/Oncology for concerning RUL mass. Pt had recent 40 lb weight loss and night sweats over the past 1-2 months. PET Scan 5/15 showed centrally located right upper lobe lung mass and hypermetabolic adrenal glands, R mediastinal/subcarinal lymph nodes, lytic lesions, soft tissues of the right common iliac, and left paraspinal region above L5, and in the left gluteus maximus muscle, suspicious for malignancy. PET also showed a small right pleural effusion. Some of her pain regions seem to correlate with hypermetabolic regions from PET while some of her other pain seems related to constipation. She was previously well controlled with 5mg  of oxycodone q6 hours but pain load has increased over the last 3 weeks with a functional decline. The day prior to admission, she required 10-15 mg oxycodone q4-6h for pain management. Due to inadequate pain control, outpatient IP clinic sent her to [facility] for better pain management and expedited workup for malignancy. Patient hemodynamically stable. Pain significantly controlled with bowel regimen and more frequent bowel movements. Patient's pain is currently well-controlled with regimen as below.  - Oncology consulted, appreciate recs   -  (5/31) Brain MRI without evidence of metastasis   - Agree with biopsy for tissue diagnosis, whichever site can be accessed sooner  - VIR consulted for biopsy, appreciate recs:   - Vertebral bone biopsy delayed on 6/2, plan for biopsy on 6/3.    - Per VIR  will need to hold Plavix for 5 days prior to bone biopsy  - Interventional pulmonology consulted; appreciate recs:   - Scheduled for endobronchial biopsy 6/4, although will defer this if able to get the bone biopsy results sooner. Will notify pulm when patient discharging to determine when to resume Plavix. They may reschedule EBUS pending path results of bone biopsy  - Palliative care consulted, appreciate recs   - Continue duloxetine 20 mg daily for neuropathic pain  - Pain:   - Acetaminophen 1000 mg q8  - Scheduled lidocaine patch  - Oxycodone 10-15mg  q4 PRN moderate pain   - Dilaudid 0.5 mg q4 PRN severe pain   - IV Zofran 4 mg Q8h PRN   - IV Compazine 5 mg PRN  - Nutrition consult   - PT/OT    Opioid-induced constipation  Patient with worsening constipation and abdominal pain in the last couple weeks since starting opioids. Suspect that this is a contributor to patient's acute pain symptoms. At home, she was getting Miralax BID and mag citrate BID per daughter, with more regular BM and improved pain. She received smog enema in the ED with immediate improvement of her pain. CT Abd/Pelvis showing multiple lytic lesions corresponding to PET, otherwise no evidence of malignancy within the abdomen or pelvis. Patient's constipation and abdominal pain improving with resumption of home bowel regimen as below.  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 BP on admission is elevated 165/72 likely secondary to uncontrolled pain. Last dose of plavix 5/27 in AM.   - Amlodipine 10 mg daily   - Spironolactone 25 mg daily   - Hold Plavix 75 mg prior to bone biopsy.   - BMP daily     Mobility Concerns:  The beneficiary has a mobility limitation that significantly impairs his/her ability to participate in one or more mobility related activities of daily living in the home.   Has a mobility limitation that significantly impairs his/her ability to participate in one or more mobility-related activities of daily living (MRADLs) ?? Has a mobility limitation that place the patient at reasonably determined heightened risk of morbidity or mortality secondary to the attempts to perform the MRADL and prevents the patient from completing the MRADL within a reasonable time frame. ??The patient is able to safely use the rollator. ??The functional mobility deficit can be sufficiently resolved with the use of a rollator.   The patient is confined to one level of the home with no toilet on that level OR   - Reevaluate PT/OT on 6/2    Chronic Problems  T2DM  Well controlled w/o medications. Most recent A1c 5.7 on 10/22.  - CTM    HLD  Continue home evolocumab     Diverticulitis  Recent colonoscopy on 02/11/2024 showed 6 mm polyp, diverticulosis without malignancy, active bleeding.   - CTM      Asthma   - Albuterol Q6h PRN       The patient's presentation is complicated by the following clinically significant conditions requiring additional evaluation and treatment: - Age related debility POA requiring additional resources: DME, PT, or OT  - Metastatic cancer POA requiring further investigation, treatment, or monitoring    Issues  Impacting Complexity of Management:  -High risk of complications from pain and/or analgesia likely to result in delirium      Medical Decision Making: Discussed the patient's management and/or test interpretation with Pulmonology and interventional radiology as summarized within this note      Daily Checklist:  Diet: Regular Diet  DVT PPx: Lovenox 40mg  q24h  Electrolytes: Replete Potassium to >/= 3.6 and Magnesium to >/= 1.8  Code Status: DNR and DNI  Dispo: Goal Discharge: Home w/ HH on 6/3 post biopsy    Team Contact Information:   Primary Team: Internal Medicine ([facility])  Primary Resident: Georjean Kite, MD  Resident's Pager: [phone] [facility])    Interval History:   No acute events overnight. Laying in bed comfortably. Abdominal pain is improved. Continues to have bone pain particularly with movement.     All other systems were reviewed and are negative except as noted in the HPI    Objective: Temp:  [36.8 ??C (98.2 ??F)-37.2 ??C (99 ??F)] 36.9 ??C (98.4 ??F)  Pulse:  [72-84] 82  Resp:  [18] 18  BP: (140-159)/(69-99) 140/99  SpO2:  [90 %-97 %] 95 %    Gen: NAD, laying in bed  HENT: atraumatic, normocephalic  Heart: RRR, systolic ejection murmur prominent at RUSB   Lungs: CTAB, no crackles or wheezes  Abdomen: soft, non-tender and non-distended  Extremities: No edema  Neuro: No focal deficits    Jabier Martens, MD  Internal Medicine, PGY-1

## 2024-02-28 NOTE — Unmapped (Signed)
 Shift Summary   OxyCODONE was administered in the morning for reports of pain and in anticipation of OOB activity with therapist.    Patient completed toileting tasks with supervision, demonstrating good safety awareness.    Nutritional intake improved with 100% snack intake in the afternoon after NPO discontinued.    Fall prevention measures were consistently implemented with no falls or injuries reported.    Overall, the patient showed progress in pain management and maintained safety and nutritional intake.   VIR unable to get to patient's biopsy today; anticipate NPO and Biopsy to be completed tomorrow.   Patient had surprise visitors today that came from [place] to visit her.     Improved Nutritional Intake: Nutritional intake improved as evidenced by 100% snack intake in the afternoon, despite being NPO earlier in the day.     Optimal Comfort and Wellbeing: Comfort and wellbeing were maintained with the patient able to feed herself throughout the shift and no significant changes in psychosocial status.     Absence of Fall and Fall-Related Injury: Fall prevention measures were consistently implemented, including toileting every two hours and hourly visual checks, with no falls or injuries reported.     Optimal Pain Control and Function: Patient reports satisfactory pain control this shift.

## 2024-02-28 NOTE — Unmapped (Signed)
 Problem: Fall Injury Risk  Goal: Absence of Fall and Fall-Related Injury  Outcome: Shift Focus     Problem: Pain Acute  Goal: Optimal Pain Control and Function  Outcome: Shift Focus     Problem: Pain Acute  Goal: Optimal Pain Control and Function  Outcome: Shift Focus

## 2024-02-29 ENCOUNTER — Inpatient Hospital Stay: Admit: 2024-02-29 | Discharge: 2024-02-29 | Payer: Medicare (Managed Care)

## 2024-02-29 LAB — BASIC METABOLIC PANEL
ANION GAP: 7 mmol/L (ref 5–14)
BLOOD UREA NITROGEN: 14 mg/dL (ref 9–23)
BUN / CREAT RATIO: 18
CALCIUM: 9.8 mg/dL (ref 8.7–10.4)
CHLORIDE: 99 mmol/L (ref 98–107)
CO2: 26 mmol/L (ref 20.0–31.0)
CREATININE: 0.78 mg/dL (ref 0.55–1.02)
EGFR CKD-EPI (2021) FEMALE: 76 mL/min/1.73m2 (ref >=60)
GLUCOSE RANDOM: 100 mg/dL (ref 70–179)
POTASSIUM: 5.1 mmol/L (ref 3.5–5.1)
SODIUM: 132 mmol/L — ABNORMAL LOW (ref 135–145)

## 2024-02-29 LAB — CBC
HEMATOCRIT: 31 % — ABNORMAL LOW (ref 34.0–44.0)
HEMOGLOBIN: 10.4 g/dL — ABNORMAL LOW (ref 11.3–14.9)
MEAN CORPUSCULAR HEMOGLOBIN CONC: 33.5 g/dL (ref 32.0–36.0)
MEAN CORPUSCULAR HEMOGLOBIN: 23.4 pg — ABNORMAL LOW (ref 25.9–32.4)
MEAN CORPUSCULAR VOLUME: 69.8 fL — ABNORMAL LOW (ref 77.6–95.7)
MEAN PLATELET VOLUME: 6.4 fL — ABNORMAL LOW (ref 6.8–10.7)
PLATELET COUNT: 799 10*9/L — ABNORMAL HIGH (ref 150–450)
RED BLOOD CELL COUNT: 4.45 10*12/L (ref 3.95–5.13)
RED CELL DISTRIBUTION WIDTH: 16.6 % — ABNORMAL HIGH (ref 12.2–15.2)
WBC ADJUSTED: 9.8 10*9/L (ref 3.6–11.2)

## 2024-02-29 LAB — MAGNESIUM: MAGNESIUM: 2.3 mg/dL (ref 1.6–2.6)

## 2024-02-29 MED ORDER — SENNOSIDES 8.6 MG TABLET
ORAL_TABLET | Freq: Every evening | ORAL | 0 refills | 15.00000 days | PRN
Start: 2024-02-29 — End: ?

## 2024-02-29 MED ORDER — BISACODYL 10 MG RECTAL SUPPOSITORY
Freq: Every day | RECTAL | 0 refills | 12.00000 days | PRN
Start: 2024-02-29 — End: 2024-03-30

## 2024-02-29 MED ORDER — POLYETHYLENE GLYCOL 3350 17 GRAM ORAL POWDER PACKET
PACK | Freq: Two times a day (BID) | ORAL | 0 refills | 30.00000 days
Start: 2024-02-29 — End: 2024-03-30

## 2024-02-29 MED ORDER — MULTIVITAMIN-IRON 9 MG-FOLIC ACID 400 MCG-CALCIUM AND MINERALS TABLET
ORAL_TABLET | Freq: Every day | ORAL | 0 refills | 30.00000 days
Start: 2024-02-29 — End: ?

## 2024-02-29 MED ORDER — DULOXETINE 20 MG CAPSULE,DELAYED RELEASE
ORAL_CAPSULE | Freq: Every day | ORAL | 0 refills | 90.00000 days
Start: 2024-02-29 — End: ?

## 2024-02-29 MED ADMIN — spironolactone (ALDACTONE) tablet 25 mg: 25 mg | ORAL | @ 12:00:00

## 2024-02-29 MED ADMIN — lidocaine (ASPERCREME) 4 % 1 patch: 1 | TRANSDERMAL | @ 12:00:00

## 2024-02-29 MED ADMIN — DULoxetine (CYMBALTA) DR capsule 20 mg: 20 mg | ORAL | @ 12:00:00

## 2024-02-29 MED ADMIN — acetaminophen (TYLENOL) tablet 1,000 mg: 1000 mg | ORAL | @ 02:00:00

## 2024-02-29 MED ADMIN — oxyCODONE (ROXICODONE) immediate release tablet 15 mg: 15 mg | ORAL | Stop: 2024-03-08

## 2024-02-29 MED ADMIN — acetaminophen (TYLENOL) tablet 1,000 mg: 1000 mg | ORAL | @ 09:00:00

## 2024-02-29 MED ADMIN — lidocaine (ASPERCREME) 4 % 1 patch: 1 | TRANSDERMAL | @ 01:00:00

## 2024-02-29 MED ADMIN — oxyCODONE (ROXICODONE) immediate release tablet 15 mg: 15 mg | ORAL | @ 09:00:00 | Stop: 2024-03-08

## 2024-02-29 MED ADMIN — acetaminophen (TYLENOL) tablet 1,000 mg: 1000 mg | ORAL

## 2024-02-29 MED ADMIN — fentaNYL (PF) (SUBLIMAZE) injection: INTRAVENOUS | @ 18:00:00 | Stop: 2024-02-29

## 2024-02-29 MED ADMIN — ondansetron (ZOFRAN-ODT) disintegrating tablet 4 mg: 4 mg | ORAL | @ 16:00:00

## 2024-02-29 MED ADMIN — oxyCODONE (ROXICODONE) immediate release tablet 10 mg: 10 mg | ORAL | @ 16:00:00 | Stop: 2024-03-08

## 2024-02-29 MED ADMIN — enoxaparin (LOVENOX) syringe 40 mg: 40 mg | SUBCUTANEOUS

## 2024-02-29 MED ADMIN — HYDROmorphone (PF) (DILAUDID) injection 1 mg: 1 mg | INTRAVENOUS | Stop: 2024-03-07

## 2024-02-29 MED ADMIN — amlodipine (NORVASC) tablet 10 mg: 10 mg | ORAL | @ 12:00:00

## 2024-02-29 MED ADMIN — multivitamins, therapeutic with minerals tablet 1 tablet: 1 | ORAL | @ 12:00:00

## 2024-02-29 MED ADMIN — midazolam (VERSED) injection: INTRAVENOUS | @ 18:00:00 | Stop: 2024-02-29

## 2024-02-29 NOTE — Unmapped (Incomplete)
 Physician Discharge Summary [facility]  6 BT [facility]  [address]  [place] [address]  Dept: [phone]  Loc: [phone]     Identifying Information:   Kayla Knight  28-Mar-1941  [id number]    Primary Care Physician: Powell-Tillman, Levonne Genese, MD     Code Status: DNR and DNI    Admit Date: 02/22/2024    Discharge Date: 02/29/2024     Discharge To: {LM DISCHARGE DESTINATION:29393}    Discharge Service: [facility] - General Medicine Floor Team MED L - Tower     Discharge Attending Physician: Laurence Pons, MD    Discharge Diagnoses: ***The problem list should be reviewed and updated in Epic to reflect your hospital course, be sure to include malnutrition and mobility issue diagnoses (then delete this red text)  Principal Problem:    Uncontrolled pain (POA: Unknown)  Active Problems:    Benign essential hypertension (POA: Yes)    Type 2 diabetes mellitus, without long-term current use of insulin   (POA: Yes)    Renal artery stenosis, native, bilateral (CMS-HCC) (POA: Yes)    Hyperlipidemia, unspecified (POA: Yes)    Nonrheumatic aortic valve insufficiency (POA: Yes)    S/P cataract extraction and insertion of intraocular lens, left (POA: Not Applicable)    Malignant neoplasm of upper lobe of right lung   (POA: Yes)    Drug-induced constipation (POA: Yes)  Resolved Problems:    * No resolved hospital problems. *      Hospital Course:   83 yo F w/ PMHx of T2DM, Resistant HTN, Hix of renal artery stenosis s/p sten L renal artery (2021) and R renal artery (2023), HLD, hx diverticulitis, persistent lower abd pain,  PET 02/10/2024 new diagnosed RUL mass with associated mediastinal and subcarinal lymphadenopathy as well as lytic lesion and R pleural effusion concerning for metastatic disease presenting for uncontrolled pain and nausea vomiting in need of expedited cancer workup     Active Problems     Uncontrolled Abdominal Pain - Pain Related to Likely Metastatic Disease    Patient presented with uncontrolled pain in the setting of recently discovered RUL lung mass with diffuse bony lesions, concerning for metastatic disease. Prior to arriving, she had increased her home oxycodone to 10-15 mg q3-4h. On arrival, much of her pain seemed to be abdominal pain, favored to be related to opioid-induced constipation as discussed below. After optimization of her bowel regimen, her pain significantly improved. Prior to discharge, she was using the 10 mg oxycodone PRN very sparingly. She did have exacerbations of her bony pain with movement but even this pain was well-controlled with her oxycodone PRN regimen here. She was discharged with short course of *** for pain, with close follow-up scheduled to ensure pain regimen is adequate.    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 Ultimately, the decision was made to pursue biopsy of vertebral lesion with VIR. Per oncology, MRI of the brain was also obtained during admission for staging purposes***. Given adequate pain control achieved during admission, patient was deemed safe for discharge home with scheduled outpatient biopsy, currently scheduled for 6/2***.       Opioid-induced constipation  Patient with worsening constipation and abdominal pain in the last couple weeks since starting opioids. At home, she was getting Miralax BID and mag citrate BID per daughter, with more regular  BM and improved pain. Her regimen during admission was Miralax 17 g BID, mag citrate 148 mL BID, and Senna nightly. With this regimen, she was having regular BM with significant improvement in her pain and distention. CT A/P was obtained and did not reveal any acute intra-abdominal pathology. On discharge, patient was instructed to continue her inpatient bowel regimen at home.       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 BP on admission was elevated to 170s systolic, but this improved with improvement of her pain. Her home Plavix was held during admission and will be held on discharge in anticipation of biopsy next week.    UTI?  Prior to admission, patient was found to have a urine cultures growing S. Agalactiae. She states that she was prescribed amoxicillin but orders did not go through so was never treated. Denied any UTI symptoms including no dysuria, urinary frequency, hematuria. No fever or chills. Repeat UA with only moderate leuk esterase and 14 WBC with urine culture showing mixed urogenital flora, so no evidence of active infection or indication to treat. Symptoms should be monitored as outpatient.    Chronic Problems  T2DM  Well controlled withouto medications. Most recent A1c 5.7 on 10/22. Blood sugars controlled during admission.     HLD  Continued home evolocumab.     Diverticulitis  Recent colonoscopy on 02/11/2024 showed 6 mm polyp, diverticulosis without malignancy, active bleeding. CT A/P inpatient without evidence of active diverticulitis.     Asthma   Albuterol Q6h PRN was available.    {The patient's hospital stay has been complicated by the following clinically significant conditions requiring additional evaluation and treatment or having a significant effect of this patient's care:76407} ***This list reflects commonly missed diagnoses that patients have, if present please select them from this list (then delete this red text)    Nutrition Assessment: ***If malnutrition diagnosed during stay, please select in the above SmartList (then delete this red text). If not diagnosed, delete the entire 'Nutrition Assessment' section.  Severe Protein-Calorie Malnutrition in the context of chronic illness (02/23/24 1420)  Energy Intake: < or equal to 75% of estimated energy requirement for > or equal to 1 month  Interpretation of Wt. Loss: > 10% x 6 month  Fat Loss: Moderate  Muscle Loss: Severe  Malnutrition Score: 3    Outpatient Provider Follow Up Issues:   ***    Touchbase with Outpatient Provider:  {Warm Handoff:61081:::1}    Procedures:  {LM PROCEDURES INPATIENT (Optional):31637}  ______________________________________________________________________  Discharge Medications: ***Select AVSMEDLIST after discharge medrec is done (then delete this red text)  {JLHAVSDCSUMM:88517}    Allergies:  Penicillins, Grass pollen-june grass standard, Lisinopril, Statins-hmg-coa reductase inhibitors, and Sulfa (sulfonamide antibiotics)  ______________________________________________________________________  Pending Test Results:  Pending Labs       Order Current Status    Clopidogrel (Plavix) Response Genotyping In process            Most Recent Labs:  {LM Labs d/c summary:57782}    Relevant Studies/Radiology:  {LM Studies Pull In (Optional):57783}  ______________________________________________________________________  Discharge Instructions:           Resources and Referrals       Commode      Length of Need: 99 months    Type of commode: Chair    Bedside commode  Bedside commode    Home Medical Equipment      Length of Need: 99 months    Specify: bed rails    Home Medical Equipment      Length of Need: 99 months    Specify: 99 months    Transfer Bench (DME)      Type: Standard    Length of Need: 99 months    Walker      Type: Standard    Wheels: 4 Wheels & Seat    Length of Need: 99 months             Follow Up instructions and Outpatient Referrals     Ambulatory Referral to Oncology      Reason for referral: undiagnosed metastatic cancer, hospital f/u s/p bone   biopsy    Requested follow up plan: You would evaluate and manage.        Appointments which have been scheduled for you      Mar 01, 2024  BRONCH, RIGID OR FLEXIBLE, INC FLUORO GUIDANCE, WHEN PERFORMED; WITH EBUS GUIDED TRANSTRACHEAL AND/OR TRANSBRONCHIAL SAMPLING, ONE OR TWO MEDIASTINAL AND/OR HILAR LYMPH NODE STATIONS OR STRUCTURES with Vera Gip, MD  PERIOP 4TH FL UNCAD [facility]) [address]  [place] [address]  [phone]     Apr 18, 2024 9:10 AM  (Arrive by 9:00 AM)  LAB ONLY with LAB [place] [id number]  [facility] [place] [facility]) [address]  [place] [address]  [phone]        Apr 19, 2024 8:00 AM  (Arrive by 7:45 AM)  PHONE VISIT - Malvin Searing VISIT with Kathlean Pao, LCSW  [place] [facility]) [address]  [place] [address]  [phone]   This visit will take place by a telephone call. The provider will call you at the time of your visit.     Reminder that Medicare Annual Wellness visits will be completed by a Care Manager not your Primary Care Provider. The Care Manager will call you at the time of your visit.     Have a complete list of your medications including vitamins, supplements and over-the-counter medication.    Have a list of top medical concerns or questions and be prepared to discuss your medical history including chronic illness and any past surgeries or hospitalizations.         Apr 25, 2024 9:00 AM  (Arrive by 8:45 AM)  Physical with Levonne Genese Powell-Tillman, MD  [facility] [place] [facility]) [address]  [place] [address]  [phone]   Arrive 15 minutes prior to appointment.              ______________________________________________________________________  Discharge Day Services:  BP 153/83  - Pulse 79  - Temp 36.9 ??C (98.4 ??F) (Oral)  - Resp 18  - Ht 167.6 cm (5' 6)  - Wt 65.3 kg (144 lb)  - SpO2 99%  - BMI 23.24 kg/m??     {LM D/C Day Services:57784::Pt seen on the day of discharge and determined appropriate for discharge.}    Condition at Discharge: {condition:18240}    Length of Discharge: I spent {discharge 67mins:23311} than 30 mins in the discharge of this patient.

## 2024-02-29 NOTE — Unmapped (Signed)
 During procedure time out, it was noted that patient consent specifically stated image-guided targeted vertebral lesion biopsy under moderate sedation. However, a more accessible lesion was seen in the right ilium. Prior to receiving any sedation medication, this was discussed with the patient on the table and verbal consent was obtained to target the pelvic lesion (also FDG-avid on recent PET/CT). Verbal consent was witnessed and confirmed by RN [place] Elmore.     Finas Huger, MD  Interventional Radiology

## 2024-02-29 NOTE — Unmapped (Signed)
 Internal Medicine (MEDL) Progress Note    Assessment & Plan:   Kayla Knight is a 83 y.o. 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 Patient has no neuro deficits, neuro exam is grossly intact. Risk factors include HTN, pre-diabetes, small vessel disease and likely malignancy. Was previously prescribed Repatha for HLD, per daughter patient has not been taking medications for several months. Patient unsure when she last had Repatha injection (last dispensed 12/03/2023). Documented hx that statin did not control HLD in the past, unable to recall adverse symptoms related to statin. Neuro consulted on 5/31, appreciate recommendations.   - A1c, TSH, Lipid panel  - Holding plavix for now given bone biopsy on 6/3, per pulm will resume on discharge. They will schedule outpatient EBUS pending bone biopsy path  - Per neuro: no need for ASA if on plavix  - TTE -ve, carotid dopplers < 50% stenosis bilaterally  - Telemetry  - Will resume Repatha for HLD, consider addition of statin too  - Plavix genotype pending given stroke while on plavix  - Neurology referral on discharge      Uncontrolled Abdominal Pain - RUL Mass- Adrenal Mass- Iliac and paraspinal lesions c/f malignancy   Pt evaluated by undiagnosed cancer clinic on 02/01/24 with Hematology/Oncology for concerning RUL mass. Pt had recent 40 lb weight loss and night sweats over the past 1-2 months. PET Scan 5/15 showed centrally located right upper lobe lung mass and hypermetabolic adrenal glands, R mediastinal/subcarinal lymph nodes, lytic lesions, soft tissues of the right common iliac, and left paraspinal region above L5, and in the left gluteus maximus muscle, suspicious for malignancy. PET also showed a small right pleural effusion. Some of her pain regions seem to correlate with hypermetabolic regions from PET while some of her other pain seems related to constipation. She was previously well controlled with 5mg  of oxycodone q6 hours but pain load has increased over the last 3 weeks with a functional decline. The day prior to admission, she required 10-15 mg oxycodone q4-6h for pain management. Due to inadequate pain control, outpatient IP clinic sent her to [facility] for better pain management and expedited workup for malignancy. Patient hemodynamically stable. Pain significantly controlled with bowel regimen and more frequent bowel movements. Patient's pain is currently well-controlled with regimen as below.  - Oncology consulted, appreciate recs   -  (5/31) Brain MRI without evidence of metastasis   - Agree with biopsy for tissue diagnosis, whichever site can be accessed sooner  -  VIR consulted for biopsy, appreciate recs:   - Vertebral bone biopsy delayed on 6/2, plan for biopsy on 6/3.   - Interventional pulmonology consulted; appreciate recs:   - Plan to discharge patient on 6/4, IP will follow-up with patient Re: EBUS pending bone biopsy path results  - Palliative care consulted, appreciate recs- plan to see patient on 6/4   - Continue duloxetine 20 mg daily for neuropathic pain  - Pain:   - Acetaminophen 1000 mg q8  - Scheduled lidocaine patch  - Oxycodone 10-15mg  q4 PRN moderate pain   - Dilaudid 0.5 mg q4 PRN severe pain   - IV Zofran 4 mg Q8h PRN   - IV Compazine 5 mg PRN  - Nutrition consult   - PT/OT    Opioid-induced constipation  Patient with worsening constipation and abdominal pain in the last couple weeks since starting opioids. Suspect that this is a contributor to patient's acute pain symptoms. At home, she was getting Miralax BID and mag citrate BID per daughter, with more regular BM and improved pain. She received smog enema in the ED with immediate improvement of her pain. CT Abd/Pelvis showing multiple lytic lesions corresponding to PET, otherwise no evidence of malignancy within the abdomen or pelvis. Patient's constipation and abdominal pain improving with resumption of home bowel regimen as below.  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 BP on admission is elevated 165/72 likely secondary to uncontrolled pain. Last dose of plavix 5/27 in AM.   - Amlodipine 10 mg daily   - Spironolactone 25 mg daily   - Hold Plavix 75 mg prior to bone biopsy.   - BMP daily     Mobility Concerns:  The beneficiary has a mobility limitation that significantly impairs his/her ability to participate in one or more mobility related activities of daily living in the home.   Has a mobility limitation that significantly impairs his/her ability to participate in one or more mobility-related activities of daily living (MRADLs) ?? Has a mobility limitation that place the patient at reasonably determined heightened risk of morbidity or mortality secondary to the attempts to perform the MRADL and prevents the patient from completing the MRADL within a reasonable time frame. ??The patient is able to safely use the rollator. ??The functional mobility deficit can be sufficiently resolved with the use of a rollator.   The patient is confined to one level of the home with no toilet on that level    Chronic Problems  T2DM  Well controlled w/o medications. Most recent A1c 5.7 on 10/22.  - CTM    HLD  Continue home evolocumab     Diverticulitis  Recent colonoscopy on 02/11/2024 showed 6 mm polyp, diverticulosis without malignancy, active bleeding.   - CTM      Asthma   - Albuterol Q6h PRN       The patient's presentation is complicated by the following clinically significant conditions requiring additional evaluation and treatment: - Age related debility POA requiring additional resources: DME, PT, or OT  - Metastatic cancer POA requiring further investigation, treatment, or monitoring    Issues Impacting Complexity of Management:  -High risk of complications from pain and/or analgesia likely to  result in delirium      Medical Decision Making: Discussed the patient's management and/or test interpretation with Pulmonology and interventional radiology as summarized within this note      Daily Checklist:  Diet: Regular Diet  DVT PPx: Lovenox 40mg  q24h  Electrolytes: Replete Potassium to >/= 3.6 and Magnesium to >/= 1.8  Code Status: DNR and DNI  Dispo: Goal Discharge: Home w/ HH on 6/3 post biopsy    Team Contact Information:   Primary Team: Internal Medicine (MEDL)  Primary Resident: Georjean Kite, MD  Resident's Pager: [phone] [facility] Senior Resident)    Interval History:   No acute events overnight. Patient feeling slightly drowsy this AM. Only got her AM pain medication oxycodone 10mg . She would like to get a shower before her procedure this afternoon.     All other systems were reviewed and are negative except as noted in the HPI    Objective:   Temp:  [36.8 ??C (98.3 ??F)-36.9 ??C (98.5 ??F)] 36.9 ??C (98.5 ??F)  Pulse:  [72-82] 82  Resp:  [18] 18  BP: (140-159)/(70-99) 156/74  SpO2:  [90 %-99 %] 99 %    Gen: NAD, laying in bed  HENT: atraumatic, normocephalic  Heart: RRR, systolic ejection murmur prominent at RUSB   Lungs: CTAB, no crackles or wheezes  Abdomen: soft, non-tender and non-distended  Extremities: No edema  Neuro: No focal deficits    Jabier Martens, MD  Internal Medicine, PGY-1

## 2024-02-29 NOTE — Unmapped (Signed)
  INTERVENTIONAL RADIOLOGY - Operative Note     VIR Post-Procedure Note    Procedure Name: Targeted right pelvic bone lesion biopsy    Pre-Op Diagnosis: Osseous lesion avid on PET, concerning for metastatic disease with unknown primary.    Post-Op Diagnosis: Same as pre-operative diagnosis    VIR Providers    Attending Physician: Dr. Finas Huger    Fellow/Resident: Tyra Galley, MD, Elfreda Grip Wise Fees, MD    Time out: Prior to the procedure, a time out was performed with all team members present. During the time out, the patient, procedure and procedure site when applicable were verbally verified by the team members and Finas Huger, MD, Tyra Galley, MD, and Elfreda Grip Phil Corti, MD.    Description of procedure: Successful CT-guided targeted biopsy of a right pelvic osseous lesion. Two passes were taken and two core samples were sent for histopathologic evaluation.     Sedation:Moderate sedation    Estimated Blood Loss: approximately 5 mL    Complications: None    See detailed procedure note with images in PACS [facility]).    The patient tolerated the procedure well without incident or complication and left the room in stable condition.    Elfreda Grip Laiylah Roettger, MD  02/29/2024 2:59 PM

## 2024-03-01 LAB — BASIC METABOLIC PANEL
ANION GAP: 12 mmol/L (ref 5–14)
BLOOD UREA NITROGEN: 14 mg/dL (ref 9–23)
BUN / CREAT RATIO: 17
CALCIUM: 10 mg/dL (ref 8.7–10.4)
CHLORIDE: 96 mmol/L — ABNORMAL LOW (ref 98–107)
CO2: 27 mmol/L (ref 20.0–31.0)
CREATININE: 0.82 mg/dL (ref 0.55–1.02)
EGFR CKD-EPI (2021) FEMALE: 72 mL/min/1.73m2 (ref >=60)
GLUCOSE RANDOM: 109 mg/dL (ref 70–179)
POTASSIUM: 4.8 mmol/L (ref 3.4–4.8)
SODIUM: 135 mmol/L (ref 135–145)

## 2024-03-01 LAB — CBC
HEMATOCRIT: 32.5 % — ABNORMAL LOW (ref 34.0–44.0)
HEMOGLOBIN: 10.6 g/dL — ABNORMAL LOW (ref 11.3–14.9)
MEAN CORPUSCULAR HEMOGLOBIN CONC: 32.5 g/dL (ref 32.0–36.0)
MEAN CORPUSCULAR HEMOGLOBIN: 22.7 pg — ABNORMAL LOW (ref 25.9–32.4)
MEAN CORPUSCULAR VOLUME: 69.8 fL — ABNORMAL LOW (ref 77.6–95.7)
MEAN PLATELET VOLUME: 6.4 fL — ABNORMAL LOW (ref 6.8–10.7)
PLATELET COUNT: 815 10*9/L — ABNORMAL HIGH (ref 150–450)
RED BLOOD CELL COUNT: 4.66 10*12/L (ref 3.95–5.13)
RED CELL DISTRIBUTION WIDTH: 16.9 % — ABNORMAL HIGH (ref 12.2–15.2)
WBC ADJUSTED: 9.4 10*9/L (ref 3.6–11.2)

## 2024-03-01 LAB — MAGNESIUM: MAGNESIUM: 2.3 mg/dL (ref 1.6–2.6)

## 2024-03-01 MED ADMIN — acetaminophen (TYLENOL) tablet 1,000 mg: 1000 mg | ORAL | @ 17:00:00

## 2024-03-01 MED ADMIN — ondansetron (ZOFRAN-ODT) disintegrating tablet 4 mg: 4 mg | ORAL | @ 14:00:00

## 2024-03-01 MED ADMIN — multivitamins, therapeutic with minerals tablet 1 tablet: 1 | ORAL | @ 12:00:00

## 2024-03-01 MED ADMIN — clopidogrel (PLAVIX) tablet 75 mg: 75 mg | ORAL | @ 12:00:00

## 2024-03-01 MED ADMIN — acetaminophen (TYLENOL) tablet 1,000 mg: 1000 mg | ORAL | @ 11:00:00

## 2024-03-01 MED ADMIN — lidocaine (ASPERCREME) 4 % 1 patch: 1 | TRANSDERMAL | @ 12:00:00

## 2024-03-01 MED ADMIN — amlodipine (NORVASC) tablet 10 mg: 10 mg | ORAL | @ 12:00:00

## 2024-03-01 MED ADMIN — polyethylene glycol (MIRALAX) packet 17 g: 17 g | ORAL | @ 02:00:00

## 2024-03-01 MED ADMIN — enoxaparin (LOVENOX) syringe 40 mg: 40 mg | SUBCUTANEOUS | @ 02:00:00

## 2024-03-01 MED ADMIN — buprenorphine 5 mcg/hour transdermal patch 1 patch: 1 | TRANSDERMAL | @ 18:00:00

## 2024-03-01 MED ADMIN — lidocaine (ASPERCREME) 4 % 1 patch: 1 | TRANSDERMAL | @ 12:00:00 | Stop: 2024-03-01

## 2024-03-01 MED ADMIN — DULoxetine (CYMBALTA) DR capsule 20 mg: 20 mg | ORAL | @ 12:00:00

## 2024-03-01 MED ADMIN — HYDROmorphone (PF) (DILAUDID) injection 1 mg: 1 mg | INTRAVENOUS | @ 11:00:00 | Stop: 2024-03-07

## 2024-03-01 MED ADMIN — spironolactone (ALDACTONE) tablet 25 mg: 25 mg | ORAL | @ 12:00:00

## 2024-03-01 MED ADMIN — oxyCODONE (ROXICODONE) immediate release tablet 10 mg: 10 mg | ORAL | @ 16:00:00 | Stop: 2024-03-08

## 2024-03-01 MED ADMIN — polyethylene glycol (MIRALAX) packet 17 g: 17 g | ORAL | @ 12:00:00

## 2024-03-01 MED ADMIN — oxyCODONE (ROXICODONE) immediate release tablet 10 mg: 10 mg | ORAL | @ 07:00:00 | Stop: 2024-03-08

## 2024-03-01 NOTE — Unmapped (Signed)
 Shift Summary   Pain was effectively managed with medication, reducing intensity and allowing rest.    Family provided support with multiple visits throughout the shift.    Fall prevention measures were consistently applied, ensuring safety.    Nausea was controlled with Zofran, contributing to patient comfort.    Overall, the patient showed improvement in comfort and pain management, with no incidents of falls or nausea.     Optimal Comfort and Wellbeing: Pain in the abdomen was initially reported as stabbing and intermittent, with a high intensity of 10 on the pain scale. After administration of pain medication, pain intensity decreased to 4, and the patient was able to sleep, indicating improved comfort and wellbeing.     Rounds/Family Conference: Family visited multiple times throughout the shift, providing support and updates.     Absence of Fall and Fall-Related Injury: Hourly visual checks and toilet assistance every two hours were maintained, and safety devices such as a walker were used consistently, preventing any falls or injuries.     Nausea and Vomiting Relief: Zofran was administered in the morning, and the patient remained calm afterward, indicating effective management of nausea.     Optimal Pain Control and Function: Pain medication was administered, resulting in a significant reduction in pain levels from 10 to 4, allowing the patient to rest.

## 2024-03-01 NOTE — Unmapped (Signed)
 Oxygen saturation on room air with patient at rest  = 97%  Oxygen saturation on room air with exertion / ambulation = 96%

## 2024-03-01 NOTE — Unmapped (Signed)
 Palliative Care Progress Note      Consultation from Requesting Attending Physician:  Laurence Pons, MD  Primary Care Provider:  Powell-Tillman, Levonne Genese, MD      Assessment/Plan:      SUMMARY:  This 83 y.o. 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     Today's visit addressed symptom control    Symptom Assessment and Recommendations:      Uncontrolled abdominal pain - Iliac and paraspinal lesions c/f metastatic disease  Today endorsing uncontrolled pain and spasms over her left flank radiating into the umbilicus. She attributed it to her bone biopsy pain, but per review of chart her biopsy was taken from the right pelvic bone, so pain unlikely related. Discussed that constipation could be driving her abdominal pain/nausea. She has been taking around 2-3 doses of oxycodone/day, with occasional use of IV dilaudid.   - Continue multimodal pain control with scheduled tylenol and lidocaine patches; she felt like pain was better controlled with four lidocaine patches, so ok to give her 4   - Continue oxycodone 10-15 mg q4H PRN  - Continue IV dilaudid 1 mg q4H PRN for pain refractory to oral meds   - Continue duloxetine 20 mg daily for nerve related pain  - Discussed adding a buprenorphine patch. Discussed benefits that it is less constipating than traditional full agonist opioids, may cause less sedation, less tolerance, less fall risk. Discussed frequency of changing the patch (once/week), site rotation, potential side effects with initiation (headaches, nausea). Advised that it will likely take at least 72 hours to build up in system. Kayla Knight was interested in trying, and would feel more comfortable staying overnight to ensure she tolerates. Would start 5 mcg/hr buprenorphine patch q7 days.   - Please ensure she has a referral to outpatient oncology palliative clinic upon discharge     Opioid induced constipation   She is having small volume squirts of liquidy stools, feeling nauseous, has hx of chronic constipation and is now on opioids. Discussed that she likely has constipation with overflow  - recommended KUB, moderate stool burden per my review   - Continue miralax BID (often refusing)   - Would add senna two tabs BID   - Would give prn suppository      Nausea  Intermittent. Sounds related to constipation. Would treat constipation as above. Prefer to use compazine as first line since it is less constipating than zofran.     Goals of care / ACP:  Code Status:   Code Status: DNR and DNI   Healthcare decision-maker if lacks capacity:   HCDM (HCPOA): Kayla Knight, Kayla Knight - Daughter - [phone]       Practical, Emotional, Spiritual Support Recommendations:  Empathetic listening and support provided during today's encounter.        Recommendations shared with primary team via Epic chat      Thank you for this consult. Please contact Marylen Snowman, MD or page Palliative Care if there are any questions.   Palliative Care team plans to visit this patient again on 6/5    Subjective:     Recent Events:  Kayla Knight was seen this AM. She is having increased pain on her left side/flank, radiating across the abdomen to the umbilicus. Today she tried walking the  halls but felt lightheaded/queasy/dizzy so she came back to bed and is laying flat. She is not having normal bowel movements- having frequent, small squirts of liquidy stools. Says she has had chronic constipation that has led to nausea in the past. In the last 24 hours she used around 2 doses of oxycodone 10 mg and 2 doses of IV hydromorphone 1 mg.     Objective:       Function:  70% - Ambulation: Reduced / unable to do normal work, some evidence of disease / Self-Care: Full / Intake: Normal or reduced / Level of Conscious: Full    Temp:  [36.7 ??C (98 ??F)-36.9 ??C (98.4 ??F)] 36.7 ??C (98 ??F)  Pulse:  [73-85] 73  Resp:  [18] 18  BP: (147-154)/(73-79) 150/73  SpO2:  [95 %-98 %] 95 %    Physical Exam:  Constitutional: In NAD, laying completely flat, occasional grimacing with pain spasms   Eyes: anicteric sclera, no discharge  ENMT: Dry oral mucosa  Pulm: Breathing comfortably on RA   Abdomen: left flank with dressing covering bone biopsy site, tenderness with laying on the area and pain with changing position, abdomen soft and nondistended   Psych: mood and affect appropriate    Testing reviewed and interpreted:   Reviewed and interpreted test results for anemia, thrombocytosis affecting assessment of underlying illness severity and prognosis    I personally spent 60 minutes face-to-face and non-face-to-face in the care of this patient, which includes all pre, intra, and post visit time on the date of service.  All documented time was specific to the E/M visit and does not include any procedures that may have been performed.     See ACP Note from today for additional billable service:  No.

## 2024-03-01 NOTE — Unmapped (Signed)
 Internal Medicine (MEDL) Progress Note    Assessment & Plan:   Kayla Knight is a 83 y.o. 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 Patient has no neuro deficits, neuro exam is grossly intact. Risk factors include HTN, pre-diabetes, small vessel disease and likely malignancy. Was previously prescribed Repatha for HLD, per daughter patient has not been taking medications for several months. Patient unsure when she last had Repatha injection (last dispensed 12/03/2023). Documented hx that statin did not control HLD in the past, unable to recall adverse symptoms related to statin. Neuro consulted on 5/31, appreciate recommendations.   - A1c, TSH, Lipid panel  - Holding plavix for now given bone biopsy on 6/3, per pulm will resume on discharge. They will schedule outpatient EBUS pending bone biopsy path  - Per neuro: no need for ASA if on plavix  - TTE -ve, carotid dopplers < 50% stenosis bilaterally  - Telemetry  - Will resume Repatha for HLD, consider addition of statin too  - Plavix genotype pending given stroke while on plavix  - Neurology referral on discharge      Uncontrolled Abdominal Pain - RUL Mass- Adrenal Mass- Iliac and paraspinal lesions c/f malignancy   Pt evaluated by undiagnosed cancer clinic on 02/01/24 with Hematology/Oncology for concerning RUL mass. Pt had recent 40 lb weight loss and night sweats over the past 1-2 months. PET Scan 5/15 showed centrally located right upper lobe lung mass and hypermetabolic adrenal glands, R mediastinal/subcarinal lymph nodes, lytic lesions, soft tissues of the right common iliac, and left paraspinal region above L5, and in the left gluteus maximus muscle, suspicious for malignancy. PET also showed a small right pleural effusion. Some of her pain regions seem to correlate with hypermetabolic regions from PET while some of her other pain seems related to constipation. She was previously well controlled with 5mg  of oxycodone q6 hours but pain load has increased over the last 3 weeks with a functional decline. The day prior to admission, she required 10-15 mg oxycodone q4-6h for pain management. Due to inadequate pain control, outpatient IP clinic sent her to [facility] for better pain management and expedited workup for malignancy. Patient hemodynamically stable. Pain significantly controlled with bowel regimen and more frequent bowel movements. Patient's pain is currently well-controlled with regimen as below.  - Oncology consulted, appreciate recs   -  (5/31) Brain MRI without evidence of metastasis   - Agree with biopsy for tissue diagnosis, whichever site can be accessed sooner  -  VIR consulted for biopsy, appreciate recs:   - s/p bone biopsy of iliac site on 6/3  - Interventional pulmonology consulted; appreciate recs:   - IP will follow-up with patient Re: EBUS pending bone biopsy path results  - Palliative care consulted, appreciate recs- outpatient referral placed   - Continue duloxetine 20 mg daily for neuropathic pain  - Pain:   - Acetaminophen 1000 mg q8  - Scheduled lidocaine patch  - Oxycodone 10-15mg  q4 PRN moderate pain   - Dilaudid 0.5 mg q4 PRN severe pain   - Start Butrans 5mg  patch q7days  - IV Zofran 4 mg Q8h PRN   - IV Compazine 5 mg PRN  - Nutrition consult   - PT/OT    Opioid-induced constipation  Patient with worsening constipation and abdominal pain in the last couple weeks since starting opioids. Suspect that this is a contributor to patient's acute pain symptoms. At home, she was getting Miralax BID and mag citrate BID per daughter, with more regular BM and improved pain. She received smog enema in the ED with immediate improvement of her pain. CT Abd/Pelvis showing multiple lytic lesions corresponding to PET, otherwise no evidence of malignancy within the abdomen or pelvis. Patient's constipation and abdominal pain improving with resumption of home bowel regimen as below.  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 BP on admission is elevated 165/72 likely secondary to uncontrolled pain. Last dose of plavix 5/27 in AM.   - Amlodipine 10 mg daily   - Spironolactone 25 mg daily   - Resume plavix   - BMP daily     Mobility Concerns:  The beneficiary has a mobility limitation that significantly impairs his/her ability to participate in one or more mobility related activities of daily living in the home.   Has a mobility limitation that significantly impairs his/her ability to participate in one or more mobility-related activities of daily living (MRADLs) ?? Has a mobility limitation that place the patient at reasonably determined heightened risk of morbidity or mortality secondary to the attempts to perform the MRADL and prevents the patient from completing the MRADL within a reasonable time frame. ??The patient is able to safely use the rollator. ??The functional mobility deficit can be sufficiently resolved with the use of a rollator.   The patient is confined to one level of the home with no toilet on that level    Chronic Problems  T2DM  Well controlled w/o medications. Most recent A1c 5.7 on 10/22.  - CTM    HLD  Continue home evolocumab     Diverticulitis  Recent colonoscopy on 02/11/2024 showed 6 mm polyp, diverticulosis without malignancy, active bleeding.   - CTM      Asthma   - Albuterol Q6h PRN       The patient's presentation is complicated by the following clinically significant conditions requiring additional evaluation and treatment: - Age related debility POA requiring additional resources: DME, PT, or OT  - Metastatic cancer POA requiring further investigation, treatment, or monitoring    Issues Impacting Complexity of Management:  -High risk of complications from pain and/or analgesia likely to result in delirium  Medical Decision Making: Discussed the patient's management and/or test interpretation with Pulmonology and interventional radiology as summarized within this note      Daily Checklist:  Diet: Regular Diet  DVT PPx: Lovenox 40mg  q24h  Electrolytes: Replete Potassium to >/= 3.6 and Magnesium to >/= 1.8  Code Status: DNR and DNI  Dispo: Goal Discharge: Home w/ HH on 6/5    Team Contact Information:   Primary Team: Internal Medicine (MEDL)  Primary Resident: Georjean Kite, MD  Resident's Pager: [phone] [facility] Senior Resident)    Interval History:   No acute events overnight. Patient endorsing more pain this AM in lower back and hip site where biopsy was. Reports that prior to her biopsy pain was relatively well controlled. Continuing to have loose bowel movements. Plan for discharge tomorrow to see how she does on Butrans patch.     All other systems were reviewed and are negative except as noted in the HPI    Objective:   Temp:  [36.8 ??C (98.2 ??F)-36.9 ??C (98.4 ??F)] 36.8 ??C (98.2 ??F)  Pulse:  [81-87] 81  Resp:  [18] 18  BP: (140-154)/(75-79) 154/76  SpO2:  [96 %-98 %] 96 %    Gen: NAD, laying in bed  HENT: atraumatic, normocephalic  Heart: RRR, systolic ejection murmur prominent at RUSB   Lungs: CTAB, no crackles or wheezes  Abdomen: soft, non-tender and non-distended  Extremities: No edema  Neuro: No focal deficits    Jabier Martens, MD  Internal Medicine, PGY-1

## 2024-03-01 NOTE — Unmapped (Addendum)
 Oncology Treatment Note    Requesting Attending Physician :  Kayla Pons, MD  Service Requesting Consult : [facility] ([facility])  Reason for Consult: Cancer of undetermined primary  Primary Oncologist: TBD likely will be seen in undifferentiated cancer clinic     Assessment: Kayla Knight is a 83 y.o. female with who was admitted for abdominal pain, nausea/vomiting, and constipation. Oncology was consulted for evaluation of cancer of undetermined primary.     The oncology consult team previously discussed with patient and [name] that there is a high suspicion of a primary lung cancer, though at this still requires a tissue diagnosis to confirm; moreover, the treatment of lung cancer can vary greatly depending on the specific pathology (i.e., small cell versus non-small cell lung cancer pathology).  Previously discussed that in some instances, when we are concerned about a diagnosis of small cell lung cancer, we may recommend remaining in patients until diagnosis is confirmed in order to initiate chemotherapy and possibly avoid treatment delays; this is because small cell lung cancer is particularly aggressive and is notably very chemotherapy sensitive.    Patient's daughter indicates that their goals are to help resolve her pain, and constipation, to help her get stronger, to understand what the diagnosis of cancer is with a biopsy, and to follow-up in clinic to discuss treatment options. They both indicated understanding that this is very likely a metastatic cancer and treatments that are offered would most likely be palliative but not curative with the goal of controlling disease and extending time and balance with quality of life from a cancer and treatment perspective.     MRI brain was obtained on 02/25/24 showed no evidence of intracranial metastases.     She underwent a bone biopsy on 02/29/24 and results are pending. The primary team anticipates discharge on 03/01/24. From an oncologic perspective there is no indication to pursue an inpatient lung biopsy at this time.     Recommendations:   - we will follow biopsy results once patient is discharged and arrange appropriate follow up (we informed the undifferentiated clinic coordinator, Kayla Knight of this plan)   - NGS to be obtained in the outpatient setting     D/w Dr. Arthor Knight. Oncology will sign off. These recommendations were discussed with the primary team. Please contact the oncology consult fellow at [phone] with any further questions.    This is a non-billable note.This treatment plan note is to convey our recommendations to the primary team. The patient was not seen or examined today.      Kayla Schurman K. Milee Qualls, MD   Hematology and Medical Oncology Fellow  [facility]

## 2024-03-01 NOTE — Unmapped (Signed)
 Shift Summary   Pain levels increased significantly despite medication administration, indicating challenges in achieving optimal pain control.    The patient remained in bed with eyes closed during hourly checks, suggesting rest and no fall incidents.    An external catheter was placed, and urine output was recorded at 500 mL.    The bed alarm was consistently active, contributing to fall prevention.    Overall, pain management remains a concern, with fluctuating pain levels and increased pain by the end of the shift.     Absence of Fall and Fall-Related Injury: Hourly visual checks showed consistent rest in bed with eyes closed, and the bed alarm was active throughout the shift, indicating no falls or fall-related injuries occurred.     Optimal Pain Control and Function: Pain levels fluctuated significantly, with a decrease to zero when not moving after initial assessment, but increased to 10 by the end of the shift despite administration of oxyCODONE and HYDROmorphone (PF).       Problem: Malnutrition  Goal: Improved Nutritional Intake  Outcome: Shift Focus     Problem: Fall Injury Risk  Goal: Absence of Fall and Fall-Related Injury  Outcome: Shift Focus  Intervention: Promote Injury-Free Environment  Recent Flowsheet Documentation  Taken 03/01/2024 0000 by Roylene Corn, RN  Safety Interventions:   assistive device   bed alarm   environmental modification   fall reduction program maintained   lighting adjusted for tasks/safety   low bed   nonskid shoes/slippers when out of bed     Problem: Nausea and Vomiting  Goal: Nausea and Vomiting Relief  Outcome: Shift Focus     Problem: Pain Acute  Goal: Optimal Pain Control and Function  Outcome: Shift Focus

## 2024-03-02 LAB — CBC
HEMATOCRIT: 32.2 % — ABNORMAL LOW (ref 34.0–44.0)
HEMOGLOBIN: 10.6 g/dL — ABNORMAL LOW (ref 11.3–14.9)
MEAN CORPUSCULAR HEMOGLOBIN CONC: 32.9 g/dL (ref 32.0–36.0)
MEAN CORPUSCULAR HEMOGLOBIN: 22.8 pg — ABNORMAL LOW (ref 25.9–32.4)
MEAN CORPUSCULAR VOLUME: 69.5 fL — ABNORMAL LOW (ref 77.6–95.7)
MEAN PLATELET VOLUME: 6.3 fL — ABNORMAL LOW (ref 6.8–10.7)
PLATELET COUNT: 785 10*9/L — ABNORMAL HIGH (ref 150–450)
RED BLOOD CELL COUNT: 4.64 10*12/L (ref 3.95–5.13)
RED CELL DISTRIBUTION WIDTH: 16.5 % — ABNORMAL HIGH (ref 12.2–15.2)
WBC ADJUSTED: 10.5 10*9/L (ref 3.6–11.2)

## 2024-03-02 LAB — BASIC METABOLIC PANEL
ANION GAP: 10 mmol/L (ref 5–14)
BLOOD UREA NITROGEN: 13 mg/dL (ref 9–23)
BUN / CREAT RATIO: 17
CALCIUM: 9.9 mg/dL (ref 8.7–10.4)
CHLORIDE: 98 mmol/L (ref 98–107)
CO2: 28 mmol/L (ref 20.0–31.0)
CREATININE: 0.77 mg/dL (ref 0.55–1.02)
EGFR CKD-EPI (2021) FEMALE: 78 mL/min/1.73m2 (ref >=60)
GLUCOSE RANDOM: 114 mg/dL (ref 70–179)
POTASSIUM: 4.5 mmol/L (ref 3.4–4.8)
SODIUM: 136 mmol/L (ref 135–145)

## 2024-03-02 LAB — MAGNESIUM: MAGNESIUM: 2.2 mg/dL (ref 1.6–2.6)

## 2024-03-02 MED ORDER — BUPRENORPHINE 5 MCG/HOUR WEEKLY TRANSDERMAL PATCH
MEDICATED_PATCH | TRANSDERMAL | 0 refills | 0.00000 days | Status: CP
Start: 2024-03-02 — End: 2024-03-02
  Filled 2024-03-07: qty 4, 28d supply, fill #0

## 2024-03-02 MED ADMIN — acetaminophen (TYLENOL) tablet 1,000 mg: 1000 mg | ORAL

## 2024-03-02 MED ADMIN — oxyCODONE (ROXICODONE) immediate release tablet 10 mg: 10 mg | ORAL | Stop: 2024-03-08

## 2024-03-02 MED ADMIN — lidocaine (ASPERCREME) 4 % 1 patch: 1 | TRANSDERMAL | @ 12:00:00

## 2024-03-02 MED ADMIN — spironolactone (ALDACTONE) tablet 25 mg: 25 mg | ORAL | @ 12:00:00

## 2024-03-02 MED ADMIN — DULoxetine (CYMBALTA) DR capsule 20 mg: 20 mg | ORAL | @ 12:00:00

## 2024-03-02 MED ADMIN — enoxaparin (LOVENOX) syringe 40 mg: 40 mg | SUBCUTANEOUS

## 2024-03-02 MED ADMIN — amlodipine (NORVASC) tablet 10 mg: 10 mg | ORAL | @ 12:00:00

## 2024-03-02 MED ADMIN — senna (SENOKOT) tablet 2 tablet: 2 | ORAL | @ 12:00:00

## 2024-03-02 MED ADMIN — oxyCODONE (ROXICODONE) immediate release tablet 10 mg: 10 mg | ORAL | @ 22:00:00 | Stop: 2024-03-08

## 2024-03-02 MED ADMIN — acetaminophen (TYLENOL) tablet 1,000 mg: 1000 mg | ORAL | @ 17:00:00

## 2024-03-02 MED ADMIN — HYDROmorphone (PF) (DILAUDID) injection 1 mg: 1 mg | INTRAVENOUS | @ 10:00:00 | Stop: 2024-03-07

## 2024-03-02 MED ADMIN — magnesium citrate solution 148 mL: 148 mL | ORAL | @ 12:00:00 | Stop: 2024-03-02

## 2024-03-02 MED ADMIN — oxyCODONE (ROXICODONE) immediate release tablet 15 mg: 15 mg | ORAL | @ 16:00:00 | Stop: 2024-03-08

## 2024-03-02 MED ADMIN — naloxegol (MOVANTIK) tablet 25 mg: 25 mg | ORAL | @ 14:00:00

## 2024-03-02 MED ADMIN — polyethylene glycol (MIRALAX) packet 17 g: 17 g | ORAL

## 2024-03-02 MED ADMIN — multivitamins, therapeutic with minerals tablet 1 tablet: 1 | ORAL | @ 12:00:00

## 2024-03-02 MED ADMIN — polyethylene glycol (MIRALAX) packet 17 g: 17 g | ORAL | @ 12:00:00

## 2024-03-02 MED ADMIN — oxyCODONE (ROXICODONE) immediate release tablet 10 mg: 10 mg | ORAL | @ 07:00:00 | Stop: 2024-03-08

## 2024-03-02 MED ADMIN — clopidogrel (PLAVIX) tablet 75 mg: 75 mg | ORAL | @ 12:00:00

## 2024-03-02 MED ADMIN — aspirin chewable tablet 81 mg: 81 mg | ORAL | @ 12:00:00

## 2024-03-02 MED ADMIN — acetaminophen (TYLENOL) tablet 1,000 mg: 1000 mg | ORAL | @ 12:00:00

## 2024-03-02 NOTE — Unmapped (Signed)
 Shift Summary   Aseptic technique and hand hygiene were maintained to prevent infection.    The patient was observed sleeping comfortably, suggesting effective pain management. Patient report new pain medication patch seems to be helping, but still needs PRN medication as available.   Peripheral IV site was assessed as dry, clean, and intact, with no intervention needed.    Oxycodone was administered PRN to manage pain effectively.    The unplanned readmission score increased slightly, indicating a need for continued monitoring.     Absence of Hospital-Acquired Illness or Injury: Aseptic technique and hand hygiene were consistently maintained, and the single patient room provided helped prevent infection.     Optimal Comfort and Wellbeing: Comfort interventions such as bed pad changes and sleep/rest enhancement were regularly performed, contributing to minimized awakenings and a regular sleep/rest pattern.     Readiness for Transition of Care: The unplanned readmission score slightly increased from 33.68 to 33.75, indicating a need for continued monitoring before transition of care.     Optimal Pain Control and Function: Oxycodone was administered PRN at 03:24 AM, and the patient was observed sleeping shortly after, suggesting effective pain management.     Improved Ability to Complete Activities of Daily Living: The patient required assistance with bathing and hygiene throughout the shift, indicating no improvement in the ability to complete ADLs independently.

## 2024-03-02 NOTE — Unmapped (Signed)
 Palliative Care Progress Note      Consultation from Requesting Attending Physician:  Laurence Pons, MD  Primary Care Provider:  Powell-Tillman, Levonne Genese, MD      Assessment/Plan:      SUMMARY:  This 83 y.o. 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 Palliative care is following for symptom management and support.     Today's visit addressed symptom control    Symptom Assessment and Recommendations:      Uncontrolled abdominal pain - Iliac and paraspinal lesions c/f metastatic disease  Today she continues to endorse uncontrolled pain and spasms over her left flank radiating into the umbilicus. We discussed that her symptoms are likely related to untreated constipation, as she backed off her bowel regimen for several days and is now constipated and has air in the colon. She has been taking around 2-3 doses of oxycodone/day, with occasional use of IV dilaudid.   - Continue multimodal pain control with scheduled tylenol and lidocaine patches; she felt like pain was better controlled with four lidocaine patches, so ok to give her 4  - Continue 5 mcg/hr buprenorphine patch Q7 days, started 6/4   - Continue oxycodone 10-15 mg q4H PRN  - Continue IV dilaudid 1 mg q4H PRN for pain refractory to oral meds   - Continue duloxetine 20 mg daily for nerve related pain  - Please ensure she has a referral to outpatient oncology palliative clinic upon discharge. Discussed that we do not have an appointment at [facility] until mid July, so primary team is reaching out to her PCP to see if they can prescribe her opioids until she sees us  in clinic. We gave them Gregorio Lease phone number and advised them to reach out to us  if issues come up in the interim regarding uncontrolled pain, or they need to reach us  for other reasons. Family worried about ability to care for Kayla Knight at home, and they are now considering SNF.     Opioid induced constipation   She is having small volume squirts of liquidy stools, feeling nauseous, has hx of chronic constipation and is now on opioids. Discussed that she likely has constipation with overflow. KUB confirmed air in the colon and moderate stool burden per my review.  - Continue miralax BID (often refusing)   - Continue senna two tabs BID   - Would give prn suppository today     Nausea  Intermittent. Sounds related to constipation. Would treat constipation as above. Prefer to use compazine as first line since it is less constipating than zofran. Discussed that buprenorphine patch can cause nausea (but it started pre-patch).    Goals of care / ACP:  Code Status:   Code Status: DNR and DNI   Healthcare decision-maker if lacks capacity:   HCDM (HCPOA): Kayla Knight, Kayla Knight - Daughter - [phone]       Practical, Emotional, Spiritual Support Recommendations:  Empathetic listening and support provided during today's encounter.        Recommendations shared with primary team by phone      Thank you for this consult. Please contact Marylen Snowman, MD or page Palliative Care if there are any questions.   Palliative Care team plans to visit this patient again on 6/6 or next week pending clinical course  Subjective:     Recent Events:  Kayla Knight was seen with daughter, granddaughter, and great-grandson at bedside this AM. She continues to have cramping/spasms in her left flank radiating across her left abdomen into the umbilicus. She had a small volume, watery bowel movement yesterday. In the past 24 hours, she got oxycodone 10 mg x3, IV dilaudid 1 mg x1, and the buprenorphine patch was placed yesterday. She has been feeling nauseous and slightly dizzy while ambulating since placing the patch (although she also felt this way yesterday morning before starting the patch).     Daughter Kayla Knight is worried about who they will bring her home. She has trouble getting out of bed. They are trying to get a hospital bed. Considering rehab. She is worried about what support her mom will have as a bridge to the outpatient palliative care appointment.     Objective:       Function:  70% - Ambulation: Reduced / unable to do normal work, some evidence of disease / Self-Care: Full / Intake: Normal or reduced / Level of Conscious: Full    Temp:  [36.5 ??C (97.7 ??F)-37.3 ??C (99.2 ??F)] 36.5 ??C (97.7 ??F)  Pulse:  [73-79] 79  Resp:  [18] 18  BP: (150-156)/(73-79) 153/79  SpO2:  [95 %-99 %] 99 %    Physical Exam:  Constitutional: In NAD, laying in bed, appears generally comfortable but occasionally grimaces when she has abdominal pain spasms   Eyes: anicteric sclera, no discharge  ENMT: Dry oral mucosa  Pulm: Breathing comfortably on RA   Abdomen: abdomen soft but bloated and slightly distended, minimal tenderness to palpation   Psych: mood and affect appropriate    Testing reviewed and interpreted:   Reviewed and interpreted test results for anemia, thrombocytosis affecting assessment of underlying illness severity and prognosis    I personally spent 75 minutes face-to-face and non-face-to-face in the care of this patient, which includes all pre, intra, and post visit time on the date of service.  All documented time was specific to the E/M visit and does not include any procedures that may have been performed.     See ACP Note from today for additional billable service:  No.

## 2024-03-02 NOTE — Unmapped (Signed)
 Internal Medicine (MEDL) Progress Note    Assessment & Plan:   Kayla Knight is a 83 y.o. 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 Pt had recent 40 lb weight loss and night sweats over the past 1-2 months. PET Scan 5/15 showed centrally located right upper lobe lung mass and hypermetabolic adrenal glands, R mediastinal/subcarinal lymph nodes, lytic lesions, soft tissues of the right common iliac, and left paraspinal region above L5, and in the left gluteus maximus muscle, suspicious for malignancy. PET also showed a small right pleural effusion.   - Oncology consulted, appreciate recs   -  (5/31) Brain MRI without evidence of metastasis   - Agree with biopsy for tissue diagnosis, whichever site can be accessed sooner  - VIR consulted for biopsy, appreciate recs:   - s/p bone biopsy of iliac site on 6/3  - Interventional pulmonology consulted; appreciate recs:   - IP will follow-up with patient Re: EBUS pending bone biopsy path results  - Nutrition consult   - PT/OT    Uncontrolled Abdominal Pain - Cancer related pain - N/V  Some of her pain regions seem to correlate with hypermetabolic regions from PET while some of her other pain seems related to constipation. She was previously well controlled with 5mg  of oxycodone q6 hours but pain load has increased over the last 3 weeks with a functional decline. The day prior to admission, she required 10-15 mg oxycodone q4-6h for pain management. Due to inadequate pain control, outpatient IP clinic sent her to [facility] for better pain management and expedited workup for malignancy. Patient hemodynamically stable.  Abdominal pain briefly improved early during admission with several bowel movements.  Continue to have MSK/cancer related pain particularly when changing position.  Over the past several days patient continues to feel weak and endorses poorly controlled pain particularly in left hip and abdominal area.  Having small loose bowel movements over the past 3 days.  KUB with findings concerning for ileus, likely secondary to opiates.  - Palliative care consulted, appreciate recs-   - Continue duloxetine 20 mg daily for neuropathic pain  - Acetaminophen 1000 mg q8  - Scheduled lidocaine patches  - Oxycodone 10-15mg  q4 PRN moderate pain   - Dilaudid 0.5 mg q4 PRN severe pain   - Start Butrans 5mg  patch q7days  -Referral placed to oncology palliative care on discharge  - PO Zofran 4 mg Q8h PRN   - PO Compazine 2.5 mg PRN  - See  below regarding bowel regimen    Opioid-induced constipation - Ileus  Patient with worsening constipation and abdominal pain in the last couple weeks since starting opioids. Suspect that this is a contributor to patient's acute pain symptoms. At home, she was getting Miralax BID and mag citrate BID per daughter, with more regular BM and improved pain. She received smog enema in the ED with immediate improvement of her pain. CT Abd/Pelvis showing multiple lytic lesions corresponding to PET, otherwise no evidence of malignancy within the abdomen or pelvis. Patient's constipation and abdominal pain improving with resumption of home bowel regimen as below.  Briefly held bowel regimen but uptitrated as of 6/5.  KUB on 6/4 with diffuse gas-filled loops can be seen in ileus. Mild colonic stool burden.  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 Patient has no neuro deficits, neuro exam is grossly intact. Risk factors include HTN, pre-diabetes, small vessel disease and likely malignancy. Was previously prescribed Repatha for HLD, per daughter patient has not been taking medications for several months. Patient unsure when she last had Repatha injection (last dispensed 12/03/2023). Documented hx that statin did not control HLD in the past, unable to recall adverse symptoms related to statin. Neuro consulted on 5/31, appreciate recommendations. TTE -ve, carotid dopplers < 50% stenosis bilaterally.    - Plavix genotyping intermediate, discussed with neuro okay for single agent therapy (ASA or Plavix) from a stroke perspective  - DAPT resumed after bone biopsy  - Will resume Repatha for HLD on discharge- daughter concerned patient was not taking consistently at home, consider addition of statin too if LDL remains elevated.   - Neurology referral on discharge    Mobility Concerns:  Continues to experience deconditioning while in the hospital, likely secondary to pain and due to immobility while being in the hospital. Have provided family with bedside commode, bed rails, half bed rails, transfer bench and walker to assist with mobility while at home. Patient had difficulty participating with PT on 6/5 due to fatigue and pain. Discussed concerns with family about mobility while at home with option of providing hospital bed vs potential dispo to SNF pending PT/OT re-eval. Ideally patient would like to return home and believe they could take care of patient if provided with hospital bed.  - Will work with case management in obtaining hospital bed at home  - Consider re-eval with PT/OT for potential SNF pending management of pain    The beneficiary has a mobility limitation that significantly impairs his/her ability to participate in one or more mobility related activities of daily living in the home.   Has a mobility limitation that significantly impairs his/her ability to participate in one or more mobility-related activities of daily living (MRADLs) ?? Has a mobility limitation that place the patient at reasonably determined heightened risk of morbidity or mortality secondary to the attempts to perform the MRADL and prevents the patient from completing the MRADL within a reasonable time frame. ??The patient is able to safely use the rollator. ??The functional mobility deficit can be sufficiently resolved with the use of a rollator.   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 BP on admission is elevated 165/72 likely secondary to uncontrolled pain. Last dose of plavix 5/27 in AM.  Last seen by cardiology (Dr. Tilda Fogo) in 07/2023, advised to continue DAPT with likely transition to single agent at 1 year follow-up in 07/2024. Plavix genotype intermediate. Advised to touch base with Dr. Tilda Fogo on discharge (out of contact until 6/12) regarding DAPT, anticipate she could transition to ASA alone for both stent and stroke.  - Amlodipine 10 mg daily   - Spironolactone 25 mg daily   - Continue ASA and Plavix    Chronic Problems  T2DM  Well controlled w/o medications. Most recent A1c 5.7 on 10/22.  - CTM    HLD  Continue home evolocumab, of note daughter concerned that patient was not taking medications as prescribed for past couple of months until she moved in with family several weeks ago.      Diverticulitis  Recent colonoscopy on 02/11/2024 showed 6 mm polyp, diverticulosis without malignancy, active bleeding.   - CTM      Asthma   - Albuterol Q6h PRN       The patient's presentation is complicated by the following clinically significant conditions requiring additional evaluation and treatment: - Age related debility POA requiring additional resources: DME, PT, or OT  - Metastatic cancer POA requiring further investigation, treatment, or monitoring    Issues Impacting Complexity of Management:  -High risk of complications from pain and/or analgesia likely to result in delirium      Medical Decision Making: Discussed the patient's management and/or test interpretation with Pulmonology and interventional radiology as summarized within this note      Daily Checklist:  Diet: Regular Diet  DVT PPx: Lovenox 40mg  q24h  Electrolytes: Replete Potassium to >/= 3.6 and Magnesium to >/= 1.8  Code Status: DNR and DNI  Dispo: Goal Discharge: Home w/ HH vs SNF pending pain control    Team Contact Information:   Primary Team: Internal Medicine (MEDL)  Primary Resident: Kayla Kite, MD  Resident's Pager: [phone] [facility])    Interval History:   No acute events overnight. Patient endorsing poor pain control this AM particularly in her L hip and abdomen. Having small loose bowel movements but not fully formed. Feels she has not had a good bowel movement in 3 days. Continues to have intermittent nausea. Not eating much due to lack of appetite.     All other systems were reviewed and are negative except as noted in the HPI    Objective:   Temp:  [36.5 ??C (97.7 ??F)-37.3 ??C (99.2 ??F)] 36.5 ??C (97.7 ??F)  Pulse:  [73-79] 79  Resp:  [18] 18  BP: (150-156)/(73-79) 153/79  SpO2:  [95 %-99 %] 99 %    Gen: NAD, laying in bed, uncomfortable appearing  HENT: atraumatic, normocephalic  Heart: RRR, systolic ejection murmur prominent at RUSB   Lungs: CTAB, no crackles or wheezes  Abdomen: soft, non-tender and non-distended  Extremities: No edema  Neuro: No focal deficits    Jabier Martens, MD  Internal Medicine, PGY-1

## 2024-03-02 NOTE — Unmapped (Signed)
 Family Caregiver Support Program  [place], [place], [place], [place], [place], [place], [employer] Link [email or web address] [phone]

## 2024-03-02 NOTE — Unmapped (Incomplete)
 Shift Summary   Pain medication administration resulted in a slight decrease in pain level from 8 to 7.    Fall risk interventions were consistently applied, preventing any falls during the shift.    Patient completed initial STS with minimal assistance and ambulated 137ft with rollator, showing improvement in mobility.    Naloxegol was administered earlier in the shift.    Overall, the patient showed slight improvement in pain control and maintained safety with fall risk interventions.     Optimal Comfort and Wellbeing: Pain in the left hip decreased slightly from a level 8 to 7 after administration of pain medication, but discomfort persisted during transitional movements in the left flank. Activity was modified to comfort.     Absence of Fall and Fall-Related Injury: Fall risk interventions were consistently applied throughout the shift, including hourly visual checks, toilet assistance every two hours, and bed alarms, with no falls reported.     Optimal Pain Control and Function: Pain medication was administered PRN, resulting in a slight reduction in pain level from 8 to 7.

## 2024-03-02 NOTE — Unmapped (Signed)
 SW spoke with patient's granddaughter at the bedside. SW informed her that he was able to locate the [facility] Caregiver Support Program. Contact information for the program Amedeo Bailiff - [email or web address] [phone]) was provided and documented in the patient???s AVS.    Patient currently has no identified social work needs. SW services can be re-consulted if needed.    Donnelly Gainer, MSW, [employer]  [phone]

## 2024-03-03 LAB — BASIC METABOLIC PANEL
ANION GAP: 11 mmol/L (ref 5–14)
BLOOD UREA NITROGEN: 13 mg/dL (ref 9–23)
BUN / CREAT RATIO: 18
CALCIUM: 10.3 mg/dL (ref 8.7–10.4)
CHLORIDE: 96 mmol/L — ABNORMAL LOW (ref 98–107)
CO2: 26 mmol/L (ref 20.0–31.0)
CREATININE: 0.73 mg/dL (ref 0.55–1.02)
EGFR CKD-EPI (2021) FEMALE: 83 mL/min/1.73m2 (ref >=60)
GLUCOSE RANDOM: 111 mg/dL (ref 70–179)
POTASSIUM: 4.5 mmol/L (ref 3.4–4.8)
SODIUM: 133 mmol/L — ABNORMAL LOW (ref 135–145)

## 2024-03-03 LAB — CBC
HEMATOCRIT: 32.3 % — ABNORMAL LOW (ref 34.0–44.0)
HEMOGLOBIN: 10.3 g/dL — ABNORMAL LOW (ref 11.3–14.9)
MEAN CORPUSCULAR HEMOGLOBIN CONC: 31.9 g/dL — ABNORMAL LOW (ref 32.0–36.0)
MEAN CORPUSCULAR HEMOGLOBIN: 22.3 pg — ABNORMAL LOW (ref 25.9–32.4)
MEAN CORPUSCULAR VOLUME: 69.8 fL — ABNORMAL LOW (ref 77.6–95.7)
MEAN PLATELET VOLUME: 6.4 fL — ABNORMAL LOW (ref 6.8–10.7)
PLATELET COUNT: 798 10*9/L — ABNORMAL HIGH (ref 150–450)
RED BLOOD CELL COUNT: 4.62 10*12/L (ref 3.95–5.13)
RED CELL DISTRIBUTION WIDTH: 16.6 % — ABNORMAL HIGH (ref 12.2–15.2)
WBC ADJUSTED: 11.2 10*9/L (ref 3.6–11.2)

## 2024-03-03 LAB — MAGNESIUM: MAGNESIUM: 2.4 mg/dL (ref 1.6–2.6)

## 2024-03-03 MED ADMIN — lidocaine (ASPERCREME) 4 % 1 patch: 1 | TRANSDERMAL | @ 12:00:00

## 2024-03-03 MED ADMIN — naloxegol (MOVANTIK) tablet 25 mg: 25 mg | ORAL | @ 12:00:00

## 2024-03-03 MED ADMIN — melatonin tablet 3 mg: 3 mg | ORAL | @ 03:00:00

## 2024-03-03 MED ADMIN — magnesium hydroxide (MILK OF MAGNESIA) oral suspension: 30 mL | ORAL | @ 16:00:00 | Stop: 2024-03-03

## 2024-03-03 MED ADMIN — oxyCODONE (ROXICODONE) immediate release tablet 15 mg: 15 mg | ORAL | @ 10:00:00 | Stop: 2024-03-08

## 2024-03-03 MED ADMIN — multivitamins, therapeutic with minerals tablet 1 tablet: 1 | ORAL | @ 12:00:00

## 2024-03-03 MED ADMIN — HYDROmorphone (PF) (DILAUDID) injection 1 mg: 1 mg | INTRAVENOUS | Stop: 2024-03-07

## 2024-03-03 MED ADMIN — senna (SENOKOT) tablet 2 tablet: 2 | ORAL | @ 12:00:00

## 2024-03-03 MED ADMIN — ondansetron (ZOFRAN-ODT) disintegrating tablet 4 mg: 4 mg | ORAL | @ 05:00:00

## 2024-03-03 MED ADMIN — polyethylene glycol (MIRALAX) packet 17 g: 17 g | ORAL | @ 12:00:00

## 2024-03-03 MED ADMIN — oxyCODONE (ROXICODONE) immediate release tablet 15 mg: 15 mg | ORAL | @ 02:00:00 | Stop: 2024-03-08

## 2024-03-03 MED ADMIN — oxyCODONE (ROXICODONE) immediate release tablet 15 mg: 15 mg | ORAL | @ 20:00:00 | Stop: 2024-03-08

## 2024-03-03 MED ADMIN — spironolactone (ALDACTONE) tablet 25 mg: 25 mg | ORAL | @ 12:00:00

## 2024-03-03 MED ADMIN — senna (SENOKOT) tablet 2 tablet: 2 | ORAL | @ 02:00:00

## 2024-03-03 MED ADMIN — methocarbamol (ROBAXIN) tablet 1,000 mg: 1000 mg | ORAL | @ 10:00:00 | Stop: 2024-03-03

## 2024-03-03 MED ADMIN — acetaminophen (TYLENOL) tablet 1,000 mg: 1000 mg | ORAL | @ 18:00:00

## 2024-03-03 MED ADMIN — prochlorperazine (COMPAZINE) injection 2.5 mg: 2.5 mg | INTRAVENOUS | @ 10:00:00

## 2024-03-03 MED ADMIN — DULoxetine (CYMBALTA) DR capsule 20 mg: 20 mg | ORAL | @ 12:00:00 | Stop: 2024-03-03

## 2024-03-03 MED ADMIN — methocarbamol (ROBAXIN) tablet 500 mg: 500 mg | ORAL | @ 02:00:00 | Stop: 2024-03-02

## 2024-03-03 MED ADMIN — clopidogrel (PLAVIX) tablet 75 mg: 75 mg | ORAL | @ 12:00:00

## 2024-03-03 MED ADMIN — polyethylene glycol (MIRALAX) packet 17 g: 17 g | ORAL | @ 02:00:00

## 2024-03-03 MED ADMIN — enoxaparin (LOVENOX) syringe 40 mg: 40 mg | SUBCUTANEOUS | @ 02:00:00

## 2024-03-03 MED ADMIN — amlodipine (NORVASC) tablet 10 mg: 10 mg | ORAL | @ 12:00:00

## 2024-03-03 MED ADMIN — bisacodyl (DULCOLAX) suppository 10 mg: 10 mg | RECTAL | @ 16:00:00 | Stop: 2024-03-03

## 2024-03-03 MED ADMIN — aspirin chewable tablet 81 mg: 81 mg | ORAL | @ 12:00:00

## 2024-03-03 MED ADMIN — acetaminophen (TYLENOL) tablet 1,000 mg: 1000 mg | ORAL | @ 10:00:00

## 2024-03-03 MED ADMIN — acetaminophen (TYLENOL) tablet 1,000 mg: 1000 mg | ORAL | @ 02:00:00

## 2024-03-03 NOTE — Unmapped (Signed)
 Palliative Care Progress Note      Consultation from Requesting Attending Physician:  Laurence Pons, MD  Primary Care Provider:  Powell-Tillman, Levonne Genese, MD      Assessment/Plan:      SUMMARY:  This 83 y.o. 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 Palliative care is following for symptom management and support.     Today's visit addressed symptom control    Symptom Assessment and Recommendations:      Uncontrolled abdominal pain - Iliac and paraspinal lesions c/f metastatic disease  Today she continues to endorse uncontrolled pain and spasms over her left flank radiating into the umbilicus. Pain sounds visceral and neuropathic in nature. Addressed overnight with methocarbamol, prn oxy, prn IV dilaudid. Constipation is likely contributing to symptoms, as she has not had a normal BM in several days. Family concerned that the cancer could be causing the pain, which it certainly could, but it is hard to say why pain escalated in this area so rapidly since she has been here. Discussed that it may make sense to get more abdominal/spinal imaging to further clarify what could be driving this.   - Recommend consideration of MRI abdomen/pelvis/T spine/L spine   - Continue multimodal pain control with scheduled tylenol and lidocaine patches; she felt like pain was better controlled with four lidocaine patches, so ok to give her 4  - Continue 5 mcg/hr buprenorphine patch Q7 days, started 6/4; consider increase to 10 mcg/hr on 6/7 if pain is not well controlled.    - Continue oxycodone 10-15 mg q4H PRN  - Continue IV dilaudid 1 mg q4H PRN for pain refractory to oral meds   - Increase duloxetine to 40 mg daily for nerve related pain  - Consider starting gabapentin 100 mg nightly over the weekend if pain persists (would avoid increasing duloxetine and starting gabapentin on the same day)   - Please ensure she has a referral to outpatient oncology palliative clinic upon discharge. Discussed that we do not have an appointment at [facility] until mid July, so primary team is reaching out to her PCP to see if they can prescribe her opioids until she sees us  in clinic. We gave them Gregorio Lease phone number and advised them to reach out to us  if issues come up in the interim regarding uncontrolled pain, or they need to reach us  for other reasons. Family worried about ability to care for [facility] at home, and they are now considering SNF.     Opioid induced constipation   She is having small volume squirts of liquidy stools, feeling nauseous, has hx of chronic constipation and is now on opioids. Had colonoscopy 5/16 that showed one 6 mm polyp in the cecum, diverticulosis, internal hemorrhoids. Discussed that she likely has constipation with overflow. KUB confirmed air in the colon and moderate stool burden per my review.  - Continue miralax BID (often refusing)   - Increase senna to three tabs BID   - Would add milk of magnesia 30 mL TID until she has a good BM  - Would give prn suppository today  - Primary team ordered naloxegol 25 mg daily      Nausea  Intermittent. Sounds related to constipation. Would treat constipation as above. Prefer to use compazine  as first line since it is less constipating than zofran. Discussed that buprenorphine patch can cause nausea (but it started pre-patch).    Goals of care / ACP:  Code Status:   Code Status: DNR and DNI   Healthcare decision-maker if lacks capacity:   HCDM (HCPOA): Larene, Ascencio - Daughter - [phone]   Granddaughter Sherian Dimitri expressed concerns with the big picture plan,-- she sees Hawley getting weaker, and just wants her to be comfortable. Bettyjane shares that she is hoping to get information about what kind of cancer she has and what the treatment options are form oncology. She knows she has to be able to get to clinic in order to have any discussions regarding treatment.       Practical, Emotional, Spiritual Support Recommendations:  Empathetic listening and support provided during today's encounter.        Recommendations shared with primary team via Epic chat      Thank you for this consult. Please contact Palliative Care if there are any questions.   Palliative Care team plans to visit this patient again next week pending clinical course     Subjective:     Recent Events:      Lizzete was seen this AM with granddaughter and daughter at bedside. She continues to have sharp pains/spasms that seem to originate from her left flank and radiate into the left side of her abdomen. She has a poor appetite. Not having bowel movements and feels nauseous. Got a dose of compazine. Overnight had a pain flare and required oral/IV opioids and methocarbamol.     Family continues to be concerned about dispo plan. They are worried about the care she will receive in a SNF compared to the hospital, but also worried about their ability to care for her at home.      Objective:       Function:  70% - Ambulation: Reduced / unable to do normal work, some evidence of disease / Self-Care: Full / Intake: Normal or reduced / Level of Conscious: Full    Temp:  [36.7 ??C (98.1 ??F)-36.8 ??C (98.3 ??F)] 36.7 ??C (98.1 ??F)  Pulse:  [71-82] 76  Resp:  [15-18] 18  BP: (153-166)/(73-81) 153/81  SpO2:  [94 %-99 %] 94 %    Physical Exam:  Constitutional: Laying in bed, appears comfortable but has intermittent pain spasms   Eyes: anicteric sclera, no discharge  ENMT: Dry oral mucosa  Pulm: Breathing comfortably on RA   Abdomen: bloated abdomen   Psych: mood and affect appropriate    Testing reviewed and interpreted:   Reviewed and interpreted test results for anemia, thrombocytosis, hyponatremia affecting assessment of underlying illness severity and prognosis    I personally spent 90 minutes face-to-face and non-face-to-face in the care of this patient, which includes all pre, intra, and post visit time on the date of service.  All documented time was specific to the E/M visit and does not include any procedures that may have been performed.     See ACP Note from today for additional billable service:  No.

## 2024-03-03 NOTE — Unmapped (Signed)
 Shift Summary   Pain management was a priority, with oxycodone administered to address high pain levels.    Bisacodyl and magnesium hydroxide were administered to address gastrointestinal concerns.    The patient tolerated activities well, showing no symptoms of increased pain after interventions.    Family support was present with the patient's daughter visiting during the shift.    Overall, the patient showed improvement in pain management and activity tolerance, with family support contributing positively to her wellbeing.     Optimal Comfort and Wellbeing: Pain in the left hip was reported as stabbing and intermittent, with a high intensity of 9 on the pain scale. Pain medication was administered, which may have contributed to the patient's ability to tolerate activity without increased pain later in the shift.     Rounds/Family Conference: The patient's daughter visited during the shift, providing family support.     Absence of Fall and Fall-Related Injury: Hourly visual checks and toileting every two hours were consistently performed, and the patient remained awake and in bed during these checks.     Optimal Pain Control and Function: Pain was initially reported as high, but interventions such as pain medication helped manage it, allowing the patient to participate in activities without increased pain.

## 2024-03-03 NOTE — Unmapped (Signed)
 Internal Medicine (MEDL) Progress Note    Assessment & Plan:   Kayla Knight is a 83 y.o. 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 Pt had recent 40 lb weight loss and night sweats over the past 1-2 months. PET Scan 5/15 showed centrally located right upper lobe lung mass and hypermetabolic adrenal glands, R mediastinal/subcarinal lymph nodes, lytic lesions, soft tissues of the right common iliac, and left paraspinal region above L5, and in the left gluteus maximus muscle, suspicious for malignancy. PET also showed a small right pleural effusion.   - Oncology consulted, appreciate recs   -  (5/31) Brain MRI without evidence of metastasis   - Agree with biopsy for tissue diagnosis, whichever site can be accessed sooner  - VIR consulted for biopsy, appreciate recs:   - s/p bone biopsy of iliac site on 6/3; pending results  - Interventional pulmonology consulted; appreciate recs:   - IP will follow-up with patient Re: EBUS pending bone biopsy path results  - Nutrition consult   - PT/OT    Uncontrolled Abdominal Pain - Cancer related pain - N/V  Some of her pain regions seem to correlate with hypermetabolic regions from PET while some of her other pain seems related to constipation. She was previously well controlled with 5mg  of oxycodone q6 hours but pain load has increased over the last 3 weeks with a functional decline. The day prior to admission, she required 10-15 mg oxycodone q4-6h for pain management. Due to inadequate pain control, outpatient IP clinic sent [facility] to [facility] for better pain management and expedited workup for malignancy. Abdominal pain briefly improved early during admission with several bowel movements.  Continue to have MSK/cancer related pain particularly when changing position.  Over the past several days patient continues to feel weak and endorses poorly controlled pain particularly in left hip and abdominal area.  Having small loose bowel movements over the past 3 days.  KUB with findings concerning for ileus, likely secondary to opiates. At this point, we think that the pain is likely due to constipation/ileus.  - Palliative care consulted, appreciate recs-   - Continue duloxetine 20 mg daily for neuropathic pain  - Acetaminophen 1000 mg q8  - Scheduled lidocaine patches  - Oxycodone 10-15mg  q4 PRN moderate pain   - Dilaudid 0.5 mg q4 PRN severe pain   - Start Butrans 5mg  patch q7days  -Referral placed to oncology palliative care on discharge  - PO Zofran 4 mg Q8h PRN   -  PO Compazine 2.5 mg PRN  - See below regarding bowel regimen    Limited Mobility   A Semi-Electric hospital bed with electric-powered adjustments to lower and raise head and foot is medically necessary because the patient requires frequent changes in body pain due to uncontrolled pain from metastatic cancer as well as frequent changes in body position to prevent/treat skin breakdown.     Opioid-induced constipation - Ileus  Patient with worsening constipation and abdominal pain in the last couple weeks since starting opioids. Suspect that this is a contributor to patient's acute pain symptoms. At home, she was getting Miralax BID and mag citrate BID per daughter, with more regular BM and improved pain. She received smog enema in the ED with immediate improvement of her pain. CT Abd/Pelvis showing multiple lytic lesions corresponding to PET, otherwise no evidence of malignancy within the abdomen or pelvis. Patient's constipation and abdominal pain improving with resumption of home bowel regimen as below.  Briefly held bowel regimen but uptitrated as of 6/5.  KUB on 6/4 with diffuse gas-filled loops can be seen in ileus. Mild colonic stool burden.  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 Patient has no neuro deficits, neuro exam is grossly intact. Risk factors include HTN, pre-diabetes, small vessel disease and likely malignancy. Was previously prescribed Repatha for HLD, per daughter patient has not been taking medications for several months. Patient unsure when she last had Repatha injection (last dispensed 12/03/2023). Documented hx that statin did not control HLD in the past, unable to recall adverse symptoms related to statin. Neuro consulted on 5/31, appreciate recommendations. TTE -ve, carotid dopplers < 50% stenosis bilaterally.    - Plavix genotyping intermediate, discussed with neuro okay for single agent therapy (ASA or Plavix) from a stroke perspective  - DAPT resumed after bone biopsy  - Will resume Repatha for HLD on discharge- daughter concerned patient was not taking consistently at home, consider addition of statin too if LDL remains elevated.   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 BP on admission is elevated 165/72 likely secondary to uncontrolled pain. Last dose of plavix 5/27 in AM.  Last seen by cardiology (Dr. Tilda Fogo) in 07/2023, advised to continue DAPT with likely transition to single agent at 1 year follow-up in 07/2024. Plavix genotype intermediate. Advised to touch base with Dr. Tilda Fogo on discharge (out of contact until 6/12) regarding DAPT, anticipate she could transition to ASA alone for both stent and stroke.  - Amlodipine 10 mg daily   - Spironolactone 25 mg daily   - Continue ASA and Plavix    Chronic Problems  T2DM  Well controlled w/o medications. Most recent A1c 5.7 on 10/22.  - CTM    HLD  Continue home evolocumab, of note daughter concerned that patient was not taking medications as prescribed for past couple of months until she moved in with family several weeks ago.      Diverticulitis  Recent colonoscopy on 02/11/2024 showed 6 mm polyp, diverticulosis without malignancy, active bleeding.   -  CTM      Asthma   - Albuterol Q6h PRN       The patient's presentation is complicated by the following clinically significant conditions requiring additional evaluation and treatment: - Age related debility POA requiring additional resources: DME, PT, or OT  - Metastatic cancer POA requiring further investigation, treatment, or monitoring    Issues Impacting Complexity of Management:  -High risk of complications from pain and/or analgesia likely to result in delirium      Medical Decision Making: Discussed the patient's management and/or test interpretation with Pulmonology and interventional radiology as summarized within this note      Daily Checklist:  Diet: Regular Diet  DVT PPx: Lovenox 40mg  q24h  Electrolytes: Replete Potassium to >/= 3.6 and Magnesium to >/= 1.8  Code Status: DNR and DNI  Dispo: Goal Discharge: Home w/ HH vs SNF pending pain control    Team Contact Information:   Primary Team: Internal Medicine (MEDL)  Primary Resident: Faustine Hoof, MD  Resident's Pager: [phone] [facility] Senior Resident)    Interval History:   Overnight, patient experienced episodes of spasms, specifically in her abdomen. We believe this is secondary to constipation.     All other systems were reviewed and are negative except as noted in the HPI    Objective:   Temp:  [36.7 ??C (98.1 ??F)-36.8 ??C (98.3 ??F)] 36.7 ??C (98.1 ??F)  Pulse:  [71-82] 76  Resp:  [15-18] 18  BP: (153-166)/(73-81) 153/81  SpO2:  [94 %-99 %] 94 %    Gen: NAD, laying in bed, uncomfortable appearing  HENT: atraumatic, normocephalic  Heart: RRR, systolic ejection murmur prominent at RUSB   Lungs: CTAB, no crackles or wheezes  Abdomen: soft, non-tender and non-distended  Extremities: No edema  Neuro: No focal deficits

## 2024-03-04 LAB — MAGNESIUM: MAGNESIUM: 2.5 mg/dL (ref 1.6–2.6)

## 2024-03-04 LAB — BASIC METABOLIC PANEL
ANION GAP: 10 mmol/L (ref 5–14)
BLOOD UREA NITROGEN: 16 mg/dL (ref 9–23)
BUN / CREAT RATIO: 22
CALCIUM: 9.8 mg/dL (ref 8.7–10.4)
CHLORIDE: 95 mmol/L — ABNORMAL LOW (ref 98–107)
CO2: 29 mmol/L (ref 20.0–31.0)
CREATININE: 0.74 mg/dL (ref 0.55–1.02)
EGFR CKD-EPI (2021) FEMALE: 81 mL/min/1.73m2 (ref >=60)
GLUCOSE RANDOM: 111 mg/dL (ref 70–179)
POTASSIUM: 4.5 mmol/L (ref 3.4–4.8)
SODIUM: 134 mmol/L — ABNORMAL LOW (ref 135–145)

## 2024-03-04 MED ADMIN — acetaminophen (TYLENOL) tablet 1,000 mg: 1000 mg | ORAL | @ 09:00:00

## 2024-03-04 MED ADMIN — enoxaparin (LOVENOX) syringe 40 mg: 40 mg | SUBCUTANEOUS | @ 01:00:00

## 2024-03-04 MED ADMIN — oxyCODONE (ROXICODONE) immediate release tablet 15 mg: 15 mg | ORAL | @ 15:00:00 | Stop: 2024-03-08

## 2024-03-04 MED ADMIN — DULoxetine (CYMBALTA) DR capsule 40 mg: 40 mg | ORAL | @ 15:00:00

## 2024-03-04 MED ADMIN — amlodipine (NORVASC) tablet 10 mg: 10 mg | ORAL | @ 15:00:00

## 2024-03-04 MED ADMIN — oxyCODONE (ROXICODONE) immediate release tablet 15 mg: 15 mg | ORAL | @ 05:00:00 | Stop: 2024-03-08

## 2024-03-04 MED ADMIN — senna (SENOKOT) tablet 2 tablet: 2 | ORAL | @ 01:00:00

## 2024-03-04 MED ADMIN — multivitamins, therapeutic with minerals tablet 1 tablet: 1 | ORAL | @ 15:00:00

## 2024-03-04 MED ADMIN — polyethylene glycol (MIRALAX) packet 17 g: 17 g | ORAL | @ 01:00:00

## 2024-03-04 MED ADMIN — polyethylene glycol (MIRALAX) packet 17 g: 17 g | ORAL | @ 15:00:00

## 2024-03-04 MED ADMIN — aspirin chewable tablet 81 mg: 81 mg | ORAL | @ 15:00:00

## 2024-03-04 MED ADMIN — lidocaine (ASPERCREME) 4 % 1 patch: 1 | TRANSDERMAL | @ 15:00:00

## 2024-03-04 MED ADMIN — clopidogrel (PLAVIX) tablet 75 mg: 75 mg | ORAL | @ 15:00:00

## 2024-03-04 MED ADMIN — oxyCODONE (ROXICODONE) immediate release tablet 15 mg: 15 mg | ORAL | @ 22:00:00 | Stop: 2024-03-08

## 2024-03-04 MED ADMIN — naloxegol (MOVANTIK) tablet 25 mg: 25 mg | ORAL | @ 15:00:00

## 2024-03-04 MED ADMIN — senna (SENOKOT) tablet 2 tablet: 2 | ORAL | @ 15:00:00

## 2024-03-04 MED ADMIN — oxyCODONE (ROXICODONE) immediate release tablet 15 mg: 15 mg | ORAL | @ 01:00:00 | Stop: 2024-03-08

## 2024-03-04 MED ADMIN — acetaminophen (TYLENOL) tablet 1,000 mg: 1000 mg | ORAL | @ 01:00:00

## 2024-03-04 MED ADMIN — spironolactone (ALDACTONE) tablet 25 mg: 25 mg | ORAL | @ 15:00:00

## 2024-03-04 MED ADMIN — gadopiclenol (ELUCIREM,VUEWAY) injection 6.5 mL: 6.5 mL | INTRAVENOUS | @ 15:00:00 | Stop: 2024-03-04

## 2024-03-04 MED ADMIN — oxyCODONE (ROXICODONE) immediate release tablet 15 mg: 15 mg | ORAL | @ 09:00:00 | Stop: 2024-03-08

## 2024-03-04 NOTE — Unmapped (Signed)
 Internal Medicine (MEDL) Progress Note    Assessment & Plan:   Kayla Knight is a 83 y.o. 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 Pt had recent 40 lb weight loss and night sweats over the past 1-2 months. PET Scan 5/15 showed centrally located right upper lobe lung mass and hypermetabolic adrenal glands, R mediastinal/subcarinal lymph nodes, lytic lesions, soft tissues of the right common iliac, and left paraspinal region above L5, and in the left gluteus maximus muscle, suspicious for malignancy. PET also showed a small right pleural effusion.   - Oncology consulted, appreciate recs   -  (5/31) Brain MRI without evidence of metastasis   - Agree with biopsy for tissue diagnosis, whichever site can be accessed sooner  - VIR consulted for biopsy, appreciate recs:   - s/p bone biopsy of iliac site on 6/3; pending results  - Interventional pulmonology consulted; appreciate recs:   - IP will follow-up with patient Re: EBUS pending bone biopsy path results  - Nutrition consult   - PT/OT    Uncontrolled Abdominal Pain - Cancer related pain - N/V  Some of her pain regions seem to correlate with hypermetabolic regions from PET while some of her other pain seems related to constipation. She was previously well controlled with 5mg  of oxycodone q6 hours but pain load has increased over the last 3 weeks with a functional decline. The day prior to admission, she required 10-15 mg oxycodone q4-6h for pain management. Due to inadequate pain control, outpatient IP clinic sent her to [facility] for better pain management and expedited workup for malignancy. Abdominal pain briefly improved early during admission with several bowel movements.  Continue to have MSK/cancer related pain particularly when changing position.  Over the past several days patient continues to feel weak and endorses poorly controlled pain particularly in left hip and abdominal area.  Having small loose bowel movements over the past 3 days.  KUB with findings concerning for ileus, likely secondary to opiates. At this point, we think that the pain is likely due to constipation/ileus.  - Palliative care consulted, appreciate recs-   - Continue duloxetine 20 mg daily for neuropathic pain  - Acetaminophen 1000 mg q8  - Scheduled lidocaine patches  - Oxycodone 10-15mg  q4 PRN moderate pain   - Dilaudid 0.5 mg q4 PRN severe pain   - Start Butrans 5mg  patch q7days  -Referral placed to oncology palliative care on discharge  - PO Zofran 4 mg Q8h PRN   -  PO Compazine 2.5 mg PRN  - See below regarding bowel regimen    Limited Mobility   A Semi-Electric hospital bed with electric-powered adjustments to lower and raise head and foot is medically necessary because the patient requires frequent changes in body pain due to uncontrolled pain from metastatic cancer as well as frequent changes in body position to prevent/treat skin breakdown.     Opioid-induced constipation - Ileus  Patient with worsening constipation and abdominal pain in the last couple weeks since starting opioids. Suspect that this is a contributor to patient's acute pain symptoms. At home, she was getting Miralax BID and mag citrate BID per daughter, with more regular BM and improved pain. She received smog enema in the ED with immediate improvement of her pain. CT Abd/Pelvis showing multiple lytic lesions corresponding to PET, otherwise no evidence of malignancy within the abdomen or pelvis. Patient's constipation and abdominal pain improving with resumption of home bowel regimen as below.  Briefly held bowel regimen but uptitrated as of 6/5.  KUB on 6/4 with diffuse gas-filled loops can be seen in ileus. Mild colonic stool burden.  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 Patient has no neuro deficits, neuro exam is grossly intact. Risk factors include HTN, pre-diabetes, small vessel disease and likely malignancy. Was previously prescribed Repatha for HLD, per daughter patient has not been taking medications for several months. Patient unsure when she last had Repatha injection (last dispensed 12/03/2023). Documented hx that statin did not control HLD in the past, unable to recall adverse symptoms related to statin. Neuro consulted on 5/31, appreciate recommendations. TTE -ve, carotid dopplers < 50% stenosis bilaterally.    - Plavix genotyping intermediate, discussed with neuro okay for single agent therapy (ASA or Plavix) from a stroke perspective  - DAPT resumed after bone biopsy  - Will resume Repatha for HLD on discharge- daughter concerned patient was not taking consistently at home, consider addition of statin too if LDL remains elevated.   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 BP on admission is elevated 165/72 likely secondary to uncontrolled pain. Last dose of plavix 5/27 in AM.  Last seen by cardiology (Dr. Tilda Fogo) in 07/2023, advised to continue DAPT with likely transition to single agent at 1 year follow-up in 07/2024. Plavix genotype intermediate. Advised to touch base with Dr. Tilda Fogo on discharge (out of contact until 6/12) regarding DAPT, anticipate she could transition to ASA alone for both stent and stroke.  - Amlodipine 10 mg daily   - Spironolactone 25 mg daily   - Continue ASA and Plavix    Chronic Problems  T2DM  Well controlled w/o medications. Most recent A1c 5.7 on 10/22.  - CTM    HLD  Continue home evolocumab, of note daughter concerned that patient was not taking medications as prescribed for past couple of months until she moved in with family several weeks ago.      Diverticulitis  Recent colonoscopy on 02/11/2024 showed 6 mm polyp, diverticulosis without malignancy, active bleeding.   - CTM      Asthma   -  Albuterol Q6h PRN       The patient's presentation is complicated by the following clinically significant conditions requiring additional evaluation and treatment: - Age related debility POA requiring additional resources: DME, PT, or OT  - Metastatic cancer POA requiring further investigation, treatment, or monitoring    Issues Impacting Complexity of Management:  -High risk of complications from pain and/or analgesia likely to result in delirium      Medical Decision Making: Discussed the patient's management and/or test interpretation with Pulmonology and interventional radiology as summarized within this note      Daily Checklist:  Diet: Regular Diet  DVT PPx: Lovenox 40mg  q24h  Electrolytes: Replete Potassium to >/= 3.6 and Magnesium to >/= 1.8  Code Status: DNR and DNI  Dispo: Goal Discharge: Home w/ HH vs SNF pending pain control    Team Contact Information:   Primary Team: Internal Medicine (MEDL)  Primary Resident: Georjean Kite, MD  Resident's Pager: [phone] [facility] Senior [facility])    Interval History:   Continues to endorse poor pain control overnight, mainly describes in her abdomen and L hip region. Slightly drowsy this AM, just woke up. Scheduled to go for MRI later today.     All other systems were reviewed and are negative except as noted in the HPI    Objective:   Temp:  [36.7 ??C (98.1 ??F)-36.9 ??C (98.4 ??F)] 36.7 ??C (98.1 ??F)  Pulse:  [76-82] 80  Resp:  [18-22] 22  BP: (148-175)/(77-81) 175/81  SpO2:  [94 %-97 %] 97 %    Gen: NAD, laying in bed, uncomfortable appearing  HENT: atraumatic, normocephalic  Heart: RRR, systolic ejection murmur prominent at RUSB   Lungs: CTAB, no crackles or wheezes  Abdomen: soft, non-tender and non-distended  Extremities: No edema  Neuro: No focal deficits    Jabier Martens, MD  Internal Medicine, PGY-1

## 2024-03-04 NOTE — Unmapped (Signed)
 ORTHOPAEDIC SPINE CONSULT  - Primary Service for this Patient: [facility] ([facility]).    Orthopaedic Contact Information   - On weekdays (3pm-6pm), please contact the resident who leaves the daily progress note.   - On nights (6pm-6am), weekends, and holidays, please page Orthopaedic Consult pager [phone]).    ASSESSMENT AND PLAN:  Kayla Knight is a 83 y.o. 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 No evidence of cord or nerve root compression at this time. No  focal tenderness at the level of injury.  No evidence of fracture in the setting of no focal tenderness  No known diagnosis at this time.   Recommend formal upright radiograhs of the T and L spine spine.  CT scan of the T and L-spine and MRI of the cervical spine with and without contrast tumor protocol  Reccomend medical/oncologic consult for assesment and workup. Also recommend VIR biopsy for tissue diagnosis  Recommend no TLS precautions at this time. No indication for TLSO orthosis at this time  Acute operative treatment is not anticipated  Primary tumor identification per primary    - Weight Bearing Status/Activity: weightbearing as tolerated on the right lower extremity left lower exremity.  - Recommended Additional Labs: full medical workup, including CBC/BMP, if not already completed  - Pain control: per primary service.  - Tobacco use: None  - Best contact number: [phone] (work)   - Follow-up plan: Orthopedic follow-up to be determined      PROCEDURE(S)   No procedures performed.    SUBJECTIVE     Chief Complaint:  Back pain    History of Present Illness:   Kayla Knight is a 83 y.o. female, who presented to [facility] following from a stool while at a celebration with her daughter.  She states that she lost consciousness and fell out of her stool.  She was brought to the emergency room where she had a CT which showed metastatic disease and had a follow-up MRI showed metastasis to the spine.  She states that she started feeling bad about 2 years ago after getting off of a trip with a friend and family she was complaining of pain over her lower abdomen and left leg.  She states over the last 3 months this has gotten worse to the point where she was needing a rollator to ambulate.  Previously she was a Tourist information centre manager.  She denies any urinary retention or bowel incontinence.  She denies any numbness and tingling to her lower extremities denies any back pain.  She reports no injury prior to this.    Medical History   Past Medical History:   Diagnosis Date    Anemia     past history, over 40 yrs ago    Cataract 2008    Surgery    Diabetes mellitus       Type II, on 9/15 patient stated she was not longer diabetic, not taking meds    Dry eye     Hearing impairment     Heart murmur 2016    Dr. Jacquiline Maul @ [facility]    Heart valve disease  Heart mumur, pt reports diagnosis 10 yrs ago (05/10/20)    History of stent insertion of renal artery 01/29/2020    ASA indefinitely and plavix x 6 months per procedure note    Hypertension     pt reports taking meds since about 1995    Metastatic malignant neoplasm   02/22/2024    Neuromuscular disorder   2014    Feet & Legs    Tear of meniscus of knee     4.5 yrs ago    Urinary incontinence     seen a doctor about nighttime bathroom use (adressed in 2019)      Surgical History   Past Surgical History:   Procedure Laterality Date    CATARACT EXTRACTION W/ INTRAOCULAR LENS IMPLANT Left 02/27/2016    EYE SURGERY  01/30/16    Cataract surgery is scheduled.    JOINT REPLACEMENT  Knee    KNEE CARTILAGE SURGERY Right 2007, 2008    x 2    KNEE SURGERY      pt reports total left knee joint replacement surgery around 2016, 2017    PLANTAR FASCIECTOMY Left     PR COLONOSCOPY FLX DX W/COLLJ SPEC WHEN PFRMD N/A 02/11/2024    Procedure: COLONOSCOPY, FLEXIBLE, PROXIMAL TO SPLENIC FLEXURE; DIAGNOSTIC, W/WO COLLECTION SPECIMEN BY BRUSH OR WASH;  Surgeon: Earl Glassing, MD;  [facility];  Service: Gastroenterology    PR REVASCULARIZE FEM/POP ARTERY,ANGIOPLASTY/STENT N/A 01/29/2020    Procedure: Peripheral Angiography W Intervetion;  Surgeon: Harvie Liner, MD;  Location: [facility] CATH;  Service: Cardiology    PR REVASCULARIZE FEM/POP ARTERY,ANGIOPLASTY/STENT Right 12/26/2021    Procedure: right renal artery intervention;  Surgeon: Harvie Liner, MD;  Location: [facility] CATH;  Service: Cardiology    PR UPPER GI ENDOSCOPY,BIOPSY N/A 04/01/2021    Procedure: UGI ENDOSCOPY; WITH BIOPSY, SINGLE OR MULTIPLE;  Surgeon: Earl Glassing, MD;  Location: [facility];  Service: Gastroenterology    PR XCAPSL CTRC RMVL INSJ IO LENS PROSTH W/O ECP Right 02/13/2016    Procedure: EXTRACAPSULAR CATARACT REMOVAL W/INSERTION OF INTRAOCULAR LENS PROSTHESIS, MANUAL OR MECHANICAL TECHNIQUE;  Surgeon: Betha Brookes, MD;  Location: [facility];  Service: Ophthalmology    PR XCAPSL CTRC RMVL INSJ IO LENS PROSTH W/O ECP Left 02/27/2016    Procedure: EXTRACAPSULAR CATARACT REMOVAL W/INSERTION OF INTRAOCULAR LENS PROSTHESIS, MANUAL OR MECHANICAL TECHNIQUE;  Surgeon: Betha Brookes, MD;  Location: [facility] OR [facility];  Service: Ophthalmology    RENAL ARTERY STENT  01/11/2020    ROOT CANAL      TUBAL LIGATION  1973    [facility]    WISDOM TOOTH EXTRACTION        Medications     Current Facility-Administered Medications:     acetaminophen (TYLENOL) tablet 1,000 mg, 1,000 mg, Oral, Q8H, Arther Larve, MD, 1,000 mg at 03/04/24 0526    albuterol (PROVENTIL HFA;VENTOLIN HFA) 90 mcg/actuation inhaler 2 puff, 2 puff, Inhalation, Q6H PRN, Arther Larve, MD    aluminum-magnesium hydroxide-simethicone (MAALOX MAX) 80-80-8 mg/mL oral suspension, 30 mL, Oral, Q4H PRN, Arther Larve, MD    amlodipine (NORVASC) tablet 10 mg, 10 mg, Oral, Daily, Arther Larve, MD, 10 mg at 03/04/24 1128    aspirin chewable tablet 81 mg, 81 mg, Oral, Daily, Jabier Martens A, MD, 81 mg at 03/04/24 1128    buprenorphine 5 mcg/hour transdermal patch 1 patch, 1 patch, Transdermal, Q7 Days, Georjean Kite, MD, 1 patch at  03/01/24 1417    clopidogrel (PLAVIX) tablet 75 mg, 75 mg, Oral, Daily, Tama Fails, Christian A, MD, 75 mg at 03/04/24 1128    dextrose 50 % in water (D50W) 50 % solution 12.5 g, 12.5 g, Intravenous, Q15 Min PRN, Arther Larve, MD    DULoxetine (CYMBALTA) DR capsule 40 mg, 40 mg, Oral, Daily, Faustine Hoof, MD, 40 mg at 03/04/24 1128    enoxaparin (LOVENOX) syringe 40 mg, 40 mg, Subcutaneous, Q24H, Buckley, Ryanne, MD, 40 mg at 03/03/24 2102    glucagon injection 1 mg, 1 mg, Intramuscular, Once PRN, Arther Larve, MD    glucose chewable tablet 16 g, 16 g, Oral, Q10 Min PRN, Arther Larve, MD    guaiFENesin (ROBITUSSIN) oral syrup, 200 mg, Oral, Q4H PRN, Arther Larve, MD    HYDROmorphone (PF) (DILAUDID) injection 1 mg, 1 mg, Intravenous, Q4H PRN, Buckley, Ryanne, MD, 1 mg at 03/02/24 2020    lidocaine (ASPERCREME) 4 % 1 patch, 1 patch, Transdermal, Daily, Buckley, Ryanne, MD, 1 patch at 03/04/24 1127    lidocaine (ASPERCREME) 4 % 1 patch, 1 patch, Transdermal, Daily, Buckley, Ryanne, MD, 1 patch at 03/04/24 1127    lidocaine (ASPERCREME) 4 % 1 patch, 1 patch, Transdermal, Daily, Jabier Martens A, MD, 1 patch at 03/04/24 1126    magnesium citrate solution 148 mL, 148 mL, Oral, Daily PRN, Jori Newer, MD    melatonin tablet 3 mg, 3 mg, Oral, Nightly PRN, Arther Larve, MD, 3 mg at 03/02/24 2310    multivitamins, therapeutic with minerals tablet 1 tablet, 1 tablet, Oral, Daily, Buckley, Ryanne, MD, 1 tablet at 03/04/24 1128 naloxegol (MOVANTIK) tablet 25 mg, 25 mg, Oral, Daily, Jori Newer, MD, 25 mg at 03/04/24 1128    naloxone (NARCAN) injection 0.1 mg, 0.1 mg, Intravenous, Q5 Min PRN, Arther Larve, MD    ondansetron (ZOFRAN-ODT) disintegrating tablet 4 mg, 4 mg, Oral, Q8H PRN, Jabier Martens A, MD, 4 mg at 03/03/24 0044    oxyCODONE (ROXICODONE) immediate release tablet 10 mg, 10 mg, Oral, Q4H PRN, 10 mg at 03/02/24 1809 **OR** oxyCODONE (ROXICODONE) immediate release tablet 15 mg, 15 mg, Oral, Q4H PRN, Buckley, Ryanne, MD, 15 mg at 03/04/24 1125    polyethylene glycol (MIRALAX) packet 17 g, 17 g, Oral, BID, Buckley, Ryanne, MD, 17 g at 03/04/24 1128    prochlorperazine (COMPAZINE) injection 2.5 mg, 2.5 mg, Intravenous, Q8H PRN, 2.5 mg at 03/03/24 0622 **OR** prochlorperazine (COMPAZINE) injection 5 mg, 5 mg, Intravenous, Q8H PRN, Arther Larve, MD    senna Alonna Art) tablet 2 tablet, 2 tablet, Oral, BID, Jabier Martens A, MD, 2 tablet at 03/04/24 1128    spironolactone (ALDACTONE) tablet 25 mg, 25 mg, Oral, Daily, Arther Larve, MD, 25 mg at 03/04/24 1128   Allergies   Allergies   Allergen Reactions    Penicillins Itching and Swelling    Grass Pollen-June Grass Standard Other (See Comments)     Asthma attack     Lisinopril Cough    Statins-Hmg-Coa Reductase Inhibitors      Ineffective at controlling cholesterol when tried in the past.    Sulfa (Sulfonamide Antibiotics) Itching        Social History Tobacco use:  reports that she quit smoking about 39 years ago. Her smoking use included cigarettes. She has been exposed to tobacco smoke. She has never used smokeless tobacco.  Alcohol use:  reports current alcohol use..  Drug use:  reports no history of drug use.  Employment: retired.  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     Family History Family History   Problem Relation Age of Onset    Heart disease Mother     Hypertension Mother     Dementia Mother     Arthritis Mother     Heart disease Father     Thyroid disease Father     Glaucoma Father     Alcohol abuse Father     Heart attack Father 74    Arthritis Father     Hypertension Father         Massive heart attack    Arthritis Sister     Glaucoma Sister     Hypertension Sister     Diabetes Sister     Diabetes Daughter     Diabetes Paternal Aunt     No Known Problems Brother     No Known Problems Maternal Aunt     No Known Problems Maternal Uncle     No Known Problems Paternal Uncle     No Known Problems Maternal Grandmother     No Known Problems Maternal Grandfather     No Known Problems Paternal Grandmother     No Known Problems Paternal Grandfather     Arthritis Sister         Noticeable in extremities    COPD Sister     Depression Sister     Hypertension Sister     Glaucoma Sister     Asthma Daughter         Ocassional    Hypertension Daughter     Miscarriages / [place] Daughter     Cancer Maternal Aunt         Died more than 14yrs ago    Early death Maternal Aunt     Diabetes Paternal Aunt         4 Aunts    Kidney disease Paternal Aunt     Amblyopia Neg Hx     Blindness Neg Hx     Cancer Neg Hx     Cataracts Neg Hx     Macular degeneration Neg Hx     Retinal detachment Neg Hx     Strabismus Neg Hx     Stroke Neg Hx           OBJECTIVE     Vitals:Patient Vitals for the past 8 hrs:   BP Temp Temp src Pulse Resp SpO2   03/04/24 1218 154/67 36.6 ??C (97.8 ??F) Oral 78 20 98 %   03/04/24 1128 164/74 -- -- -- -- --     General:well-nourished and no acute distress    Motor R L       Deltoid 4+ 4+       Bicep 5 5       Tricep 5 5       WE 5 5       Grip 5 5       IO 5 5       IP 5 5       Quad 5 5  Pathologic R L   TA 5 5  Hoffmann's neg. neg.   EHL 4+ 4+  Babinski neg. neg.   GS 5 5  Clonus neg. neg.     Spine Exam Inspection/palpation: No midline tenderness on spine.  Skin: No laceration, abrasion, ecchymosis, or other skin abnormality.  Rectal tone: normal resting and volitional tone toe pending     Right upper extremity Inspection/palpation: No  swelling, erythema, deformity, atrophy or hypertrophy noted.  Range of motion: full range of motion.  Skin: No laceration, abrasion, ecchymosis, or other skin abnormality.     Left upper extremity Inspection/palpation: No swelling, erythema, deformity, atrophy or hypertrophy noted.  Range of motion: full range of motion.  Skin: No laceration, abrasion, ecchymosis, or other skin abnormality.     Right lower extremity Inspection/palpation: No swelling, erythema, deformity, atrophy or hypertrophy noted.  Range of motion: full range of motion.  Skin: No laceration, abrasion, ecchymosis, or other skin abnormality.     Left lower extremity Inspection/palpation: No swelling, erythema, deformity, atrophy or hypertrophy noted.  Range of motion: full range of motion.  Skin: No laceration, abrasion, ecchymosis, or other skin abnormality.         Test Results  Imaging  Radiology studies were personally reviewed.    Upright XR pending  MRI T&L spine spine demonstrating a multiple metastatic lesions to the spine.    Labs  No labs today.    Problem List  Principal Problem:    Uncontrolled pain  Active Problems:    Benign essential hypertension    Type 2 diabetes mellitus, without long-term current use of insulin      Renal artery stenosis, native, bilateral (CMS-HCC)    Hyperlipidemia, unspecified    Nonrheumatic aortic valve insufficiency    S/P cataract extraction and insertion of intraocular lens, left    Malignant neoplasm of upper lobe of right lung      Drug-induced constipation

## 2024-03-04 NOTE — Unmapped (Signed)
 Shift Summary   Pain was effectively managed with oxyCODONE, reducing it from 10 to 0, allowing the patient to sleep.    The patient was consistently toileted every two hours and visually checked hourly, preventing any fall-related incidents.    Safety devices were used for transfers as needed, ensuring patient safety.    The patient demonstrated independence by feeding herself and turning herself in bed.    Overall, the patient showed significant improvement in pain management and maintained safety throughout the shift.     Optimal Comfort and Wellbeing: Pain in the abdomen was initially described as aching and throbbing with a severity of 10, but after administration of oxyCODONE, pain was reduced to 0, and the patient was able to sleep.     Absence of Fall and Fall-Related Injury: The patient was consistently toileted every two hours and visually checked hourly, with safety devices used for transfers as needed, preventing any fall-related incidents.     Optimal Pain Control and Function: Pain was initially constant and acute, but after intervention, it was effectively managed, allowing the patient to rest comfortably.     Improved Ability to Complete Activities of Daily Living: The patient was able to feed herself and turn herself in bed, indicating some level of independence in daily activities.

## 2024-03-04 NOTE — Unmapped (Signed)
 Shift Summary   Oxycodone was administered PRN multiple times during the shift, but pain levels remained unchanged at 10.    Patient was observed sleeping at two different times during the shift.    Side rails were consistently maintained at 3/4 to ensure safety.    Toileting was performed every two hours to prevent falls.    Overall, pain management was ineffective, and safety measures were consistently applied.     Absence of Hospital-Acquired Illness or Injury: Hand hygiene and rest were promoted, and a single patient room was provided to prevent infection.     Optimal Comfort and Wellbeing: Despite interventions, pain remained constant and acute throughout the shift, with no change in the pain scale rating.     Absence of Fall and Fall-Related Injury: Hourly visual checks and toileting every two hours were maintained, and side rails were consistently at 3/4 to prevent falls.     Optimal Pain Control and Function: Oxycodone was administered PRN, but pain levels remained at 10 on the pain scale, indicating no improvement in pain control.

## 2024-03-05 LAB — BASIC METABOLIC PANEL
ANION GAP: 10 mmol/L (ref 5–14)
BLOOD UREA NITROGEN: 13 mg/dL (ref 9–23)
BUN / CREAT RATIO: 18
CALCIUM: 10.1 mg/dL (ref 8.7–10.4)
CHLORIDE: 96 mmol/L — ABNORMAL LOW (ref 98–107)
CO2: 28 mmol/L (ref 20.0–31.0)
CREATININE: 0.71 mg/dL (ref 0.55–1.02)
EGFR CKD-EPI (2021) FEMALE: 86 mL/min/1.73m2 (ref >=60)
GLUCOSE RANDOM: 99 mg/dL (ref 70–179)
POTASSIUM: 4.5 mmol/L (ref 3.4–4.8)
SODIUM: 134 mmol/L — ABNORMAL LOW (ref 135–145)

## 2024-03-05 LAB — CBC
HEMATOCRIT: 29.6 % — ABNORMAL LOW (ref 34.0–44.0)
HEMOGLOBIN: 9.9 g/dL — ABNORMAL LOW (ref 11.3–14.9)
MEAN CORPUSCULAR HEMOGLOBIN CONC: 33.4 g/dL (ref 32.0–36.0)
MEAN CORPUSCULAR HEMOGLOBIN: 23.1 pg — ABNORMAL LOW (ref 25.9–32.4)
MEAN CORPUSCULAR VOLUME: 69.2 fL — ABNORMAL LOW (ref 77.6–95.7)
MEAN PLATELET VOLUME: 6.4 fL — ABNORMAL LOW (ref 6.8–10.7)
PLATELET COUNT: 798 10*9/L — ABNORMAL HIGH (ref 150–450)
RED BLOOD CELL COUNT: 4.28 10*12/L (ref 3.95–5.13)
RED CELL DISTRIBUTION WIDTH: 16.5 % — ABNORMAL HIGH (ref 12.2–15.2)
WBC ADJUSTED: 9.7 10*9/L (ref 3.6–11.2)

## 2024-03-05 LAB — MAGNESIUM: MAGNESIUM: 2.3 mg/dL (ref 1.6–2.6)

## 2024-03-05 MED ADMIN — acetaminophen (TYLENOL) tablet 1,000 mg: 1000 mg | ORAL | @ 02:00:00

## 2024-03-05 MED ADMIN — HYDROmorphone (PF) (DILAUDID) injection 1 mg: 1 mg | INTRAVENOUS | @ 13:00:00 | Stop: 2024-03-07

## 2024-03-05 MED ADMIN — naloxegol (MOVANTIK) tablet 25 mg: 25 mg | ORAL | @ 18:00:00

## 2024-03-05 MED ADMIN — amlodipine (NORVASC) tablet 10 mg: 10 mg | ORAL | @ 18:00:00

## 2024-03-05 MED ADMIN — senna (SENOKOT) tablet 2 tablet: 2 | ORAL | @ 02:00:00

## 2024-03-05 MED ADMIN — ondansetron (ZOFRAN) injection 4 mg: 4 mg | INTRAVENOUS | @ 17:00:00

## 2024-03-05 MED ADMIN — polyethylene glycol (MIRALAX) packet 17 g: 17 g | ORAL | @ 18:00:00

## 2024-03-05 MED ADMIN — aspirin chewable tablet 81 mg: 81 mg | ORAL | @ 18:00:00

## 2024-03-05 MED ADMIN — oxyCODONE (ROXICODONE) immediate release tablet 15 mg: 15 mg | ORAL | @ 18:00:00 | Stop: 2024-03-08

## 2024-03-05 MED ADMIN — gabapentin (NEURONTIN) capsule 100 mg: 100 mg | ORAL | @ 02:00:00

## 2024-03-05 MED ADMIN — HYDROmorphone (PF) (DILAUDID) injection 1 mg: 1 mg | INTRAVENOUS | @ 05:00:00 | Stop: 2024-03-07

## 2024-03-05 MED ADMIN — clopidogrel (PLAVIX) tablet 75 mg: 75 mg | ORAL | @ 18:00:00

## 2024-03-05 MED ADMIN — enoxaparin (LOVENOX) syringe 40 mg: 40 mg | SUBCUTANEOUS | @ 02:00:00

## 2024-03-05 MED ADMIN — polyethylene glycol (MIRALAX) packet 17 g: 17 g | ORAL | @ 02:00:00

## 2024-03-05 MED ADMIN — oxyCODONE (ROXICODONE) immediate release tablet 15 mg: 15 mg | ORAL | @ 02:00:00 | Stop: 2024-03-08

## 2024-03-05 MED ADMIN — spironolactone (ALDACTONE) tablet 25 mg: 25 mg | ORAL | @ 18:00:00

## 2024-03-05 MED ADMIN — acetaminophen (TYLENOL) tablet 1,000 mg: 1000 mg | ORAL | @ 10:00:00

## 2024-03-05 MED ADMIN — gadopiclenol (ELUCIREM,VUEWAY) injection 6 mL: 6 mL | INTRAVENOUS | @ 15:00:00 | Stop: 2024-03-05

## 2024-03-05 MED ADMIN — DULoxetine (CYMBALTA) DR capsule 40 mg: 40 mg | ORAL | @ 18:00:00

## 2024-03-05 MED ADMIN — oxyCODONE (ROXICODONE) immediate release tablet 15 mg: 15 mg | ORAL | @ 10:00:00 | Stop: 2024-03-08

## 2024-03-05 MED ADMIN — senna (SENOKOT) tablet 2 tablet: 2 | ORAL | @ 18:00:00

## 2024-03-05 MED ADMIN — multivitamins, therapeutic with minerals tablet 1 tablet: 1 | ORAL | @ 18:00:00

## 2024-03-05 MED ADMIN — acetaminophen (TYLENOL) tablet 1,000 mg: 1000 mg | ORAL | @ 18:00:00

## 2024-03-05 NOTE — Unmapped (Signed)
 ORTHOPAEDIC CONSULT  - Primary Service for this Patient: [facility] ([facility]).  Patient is seen in consultation at the request of [facility] ED for evaluation of the following:     ASSESSMENT AND PLAN:  Kayla Knight is a 83 y.o. right handed female with a history of renal artery stenosis, type 2 diabetes, hypertension, aortic valve insufficiency, left TKA who is admitted for metastatic neoplasia of unknown origin seen in consultation at the request of Laurence Pons, MD for the evaluation of the following:     1) Bilateral proximal femur lesions, left ilium and pubic rami lesion  PET scan demonstrates multiple lesions in the bilateral proximal femur that is redemonstrated on left femur XR as well as pelvic lesions that appear lytic on plain films. Patient has no tenderness with logroll or hip flexion of the bilateral legs. No hip pain with ambulation. No tenderness to palpation along the femur  Recommend weightbearing as tolerated  operative treatment is not anticipated    This patient discussed with senior on call resident, consult will be staffed with on-call attending.    * Please contact the resident who leaves daily progress notes for any questions while patient is inpatient during weekdays.  * Please page orthopaedic consult pager [phone]) on nights (after 5PM) and weekends.    PROCEDURE(S)   none    SUBJECTIVE     Chief Complaint:  Mets seen on PET    History of Present Illness:     Kayla Knight is a 83 y.o. female, who presented to [facility] following from a stool while at a celebration with her daughter.  She states that she lost consciousness and fell out of her stool.  She was brought to the emergency room where she had a CT which showed metastatic disease and had a follow-up MRI showed metastasis to the spine.  She states that she started feeling bad about 2 years ago after getting off of a trip with a friend and family she was complaining of pain over her lower abdomen and left leg.  She states over the last 3 months this has gotten worse to the point where she was needing a rollator to ambulate.  Previously she was a Tourist information centre manager.  She denies any urinary retention or bowel incontinence.  She denies any numbness and tingling to her lower extremities denies any back pain.  She reports no injury prior to this.     Medical History   Past Medical History[1]   Surgical History   Past Surgical History[2]   Medications   Current Medications[3]   Allergies   Penicillins, Grass pollen-june grass standard, Lisinopril, Statins-hmg-coa reductase inhibitors, and Sulfa (sulfonamide antibiotics)     OBJECTIVE     PHYSICAL EXAM   Constitutional   Vitals  Estimated body mass index is 23.24 kg/m?? as calculated from the following:    Height as of this encounter: 167.6 cm (5' 6).    Weight as of this encounter: 65.3 kg (144 lb).   Vitals:    03/04/24 2341   BP: 146/75   Pulse: 83   Resp: 20   Temp: 36.8 ??C (98.2 ??F)   SpO2: 92%        General appearance  well-nourished, no acute distress   Musculoskeletal   Extremities:  RUE: No swelling, ecchymoses, deformity, or effusion. Skin intact. No tenting, impending open fracture. Nontender to palpation, with full and painless ROM throughout. + Motor in Axillary, AIN, PIN, Ulnar distributions. SILT in axillary,  median, radial, and ulnar distributions.  2-point discrimination deferred. 2+ radial pulse with warm and well perfused digits. Compartments soft and compressible.      LUE: No swelling, ecchymoses, deformity, or effusion. Skin intact. No tenting, impending open fracture. Nontender to palpation, with full and painless ROM throughout. + Motor in Axillary, AIN, PIN, Ulnar distributions. SILT in axillary, median, radial, and ulnar distributions. 2-point discrimination deferred. 2+ radial pulse with warm and well perfused digits. Compartments soft and compressible.     RLE: No swelling, ecchymoses, deformity, or effusion. Skin intact.  No tenting, impending open fracture. Nontender to palpation, with full and painless ROM throughout. + GS/TA/EHL. SILT in DP/SP/S/S/T distributions.  2-point discrimination deferred.  2+ DP pulse with warm and well perfused toes. Ligamentous exam stable. Compartments soft and compressible, with no pain on passive stretch.      LLE: No swelling, ecchymoses, deformity, or effusion. Skin intact. No tenting, impending open fracture.Nontender to palpation, with full and painless ROM throughout. + GS/TA/EHL. SILT in DP/SP/S/S/T distributions. 2-point discrimination deferred. 2+ DP pulse with warm and well perfused toes. Ligamentous exam stable. Compartments soft and compressible, with no pain on passive stretch.           Test Results  Imaging  Radiology studies were personally reviewed.    Labs  Lab Results   Component Value Date    WBC 11.2 03/03/2024    HGB 10.3 (L) 03/03/2024    HCT 32.3 (L) 03/03/2024    PLT 798 (H) 03/03/2024      No results for input(s): SPECTYPEART, PHART, PCO2ART, PO2ART, HCO3ART, BEART, O2SATART in the last 24 hours.   Lab Results   Component Value Date    NA 134 (L) 03/04/2024    K 4.5 03/04/2024    GLU 111 03/04/2024     Lab Results   Component Value Date    PT 11.7 02/22/2024    INR 1.03 02/22/2024    APTT 28.3 02/22/2024     Lab Results   Component Value Date    ESR 91 (H) 01/21/2024    CRP 58.7 (H) 01/21/2024     Lab Results   Component Value Date    ALB 4.0 01/30/2015     Severe Protein-Calorie Malnutrition in the context of chronic illness (02/23/24 1420)  Energy Intake: < or equal to 75% of estimated energy requirement for > or equal to 1 month  Interpretation of Wt. Loss: > 10% x 6 month  Fat Loss: Moderate  Muscle Loss: Severe  Malnutrition Score: 3            Comorbidities  See primary team documentation, or consult notes from medical or trauma services.    Problem List[4]    Note created by Maryla Snooks, MD, March 05, 2024 4:10 AM          [1]   Past Medical History:  Diagnosis Date    Anemia     past history, over 40 yrs ago    Cataract 2008    Surgery    Diabetes mellitus       Type II, on 9/15 patient stated she was not longer diabetic, not taking meds    Dry eye     Hearing impairment     Heart murmur 2016    [facility] Jacquiline [facility]ul [facility]    Heart valve disease     Heart mumur, pt reports diagnosis 10 yrs ago (05/10/20)    History of stent  insertion of renal artery 01/29/2020    ASA indefinitely and plavix x 6 months per procedure note    Hypertension     pt reports taking meds since about 1995    Metastatic malignant neoplasm   02/22/2024    Neuromuscular disorder   2014    Feet & Legs    Tear of meniscus of knee     4.5 yrs ago    Urinary incontinence     seen a doctor about nighttime bathroom use (adressed in 2019)   [2]   Past Surgical History:  Procedure Laterality Date    CATARACT EXTRACTION W/ INTRAOCULAR LENS IMPLANT Left 02/27/2016    EYE SURGERY  01/30/16    Cataract surgery is scheduled.    JOINT REPLACEMENT  Knee    KNEE CARTILAGE SURGERY Right 2007, 2008    x 2    KNEE SURGERY      pt reports total left knee joint replacement surgery around 2016, 2017    PLANTAR FASCIECTOMY Left     PR COLONOSCOPY FLX DX W/COLLJ SPEC WHEN PFRMD N/A 02/11/2024    Procedure: COLONOSCOPY, FLEXIBLE, PROXIMAL TO SPLENIC FLEXURE; DIAGNOSTIC, W/WO COLLECTION SPECIMEN BY BRUSH OR WASH;  Surgeon: Earl Glassing, MD;  Location: [facility];  Service: Gastroenterology    PR REVASCULARIZE FEM/POP ARTERY,ANGIOPLASTY/STENT N/A 01/29/2020    Procedure: Peripheral Angiography W Intervetion;  Surgeon: Harvie Liner, MD;  Location: [facility] CATH;  Service: Cardiology    PR REVASCULARIZE FEM/POP ARTERY,ANGIOPLASTY/STENT Right 12/26/2021    Procedure: right renal artery intervention;  Surgeon: Harvie Liner, MD;  Location: [facility] CATH;  Service: Cardiology    PR UPPER GI ENDOSCOPY,BIOPSY N/A 04/01/2021    Procedure: UGI ENDOSCOPY; WITH BIOPSY, SINGLE OR MULTIPLE;  Surgeon: Earl Glassing, MD; Location: [facility];  Service: Gastroenterology    PR XCAPSL CTRC RMVL INSJ IO LENS PROSTH W/O ECP Right 02/13/2016    Procedure: EXTRACAPSULAR CATARACT REMOVAL W/INSERTION OF INTRAOCULAR LENS PROSTHESIS, MANUAL OR MECHANICAL TECHNIQUE;  Surgeon: Betha Brookes, MD;  Location: [facility] OR [facility];  Service: Ophthalmology    PR XCAPSL CTRC RMVL INSJ IO LENS PROSTH W/O ECP Left 02/27/2016    Procedure: EXTRACAPSULAR CATARACT REMOVAL W/INSERTION OF INTRAOCULAR LENS PROSTHESIS, MANUAL OR MECHANICAL TECHNIQUE;  Surgeon: Betha Brookes, MD;  Location: [facility] OR [facility];  Service: Ophthalmology    RENAL ARTERY STENT  01/11/2020    ROOT CANAL      TUBAL LIGATION  1973    [facility]    WISDOM TOOTH EXTRACTION     [3]   Current Facility-Administered Medications   Medication Dose Route Frequency Provider Last Rate Last Admin    acetaminophen (TYLENOL) tablet 1,000 mg  1,000 mg Oral Q8H Arther Larve, MD   1,000 mg at 03/04/24 2212    albuterol (PROVENTIL HFA;VENTOLIN HFA) 90 mcg/actuation inhaler 2 puff  2 puff Inhalation Q6H PRN Arther Larve, MD        aluminum-magnesium hydroxide-simethicone (MAALOX MAX) 80-80-8 mg/mL oral suspension  30 mL Oral Q4H PRN Arther Larve, MD        amlodipine (NORVASC) tablet 10 mg  10 mg Oral Daily Arther Larve, MD   10 mg at 03/04/24 1128    aspirin chewable tablet 81 mg  81 mg Oral Daily Jabier Martens A, MD   81 mg at 03/04/24 1128    buprenorphine 5 mcg/hour transdermal patch 1 patch  1 patch Transdermal Q7 Days Jabier Martens A,  MD   1 patch at 03/01/24 1417    clopidogrel (PLAVIX) tablet 75 mg  75 mg Oral Daily Jabier Martens A, MD   75 mg at 03/04/24 1128    dextrose 50 % in water (D50W) 50 % solution 12.5 g  12.5 g Intravenous Q15 Min PRN Arther Larve, MD        DULoxetine (CYMBALTA) DR capsule 40 mg  40 mg Oral Daily Hamburger, Emily K, MD   40 mg at 03/04/24 1128    enoxaparin (LOVENOX) syringe 40 mg  40 mg Subcutaneous Q24H Buckley, Ryanne, MD   40 mg at 03/04/24 2213    gabapentin (NEURONTIN) capsule 100 mg  100 mg Oral Nightly Jabier Martens A, MD   100 mg at 03/04/24 2212    glucagon injection 1 mg  1 mg Intramuscular Once PRN Arther Larve, MD        glucose chewable tablet 16 g  16 g Oral Q10 Min PRN Arther Larve, MD        guaiFENesin (ROBITUSSIN) oral syrup  200 mg Oral Q4H PRN Arther Larve, MD        HYDROmorphone (PF) (DILAUDID) injection 1 mg  1 mg Intravenous Q4H PRN Buckley, Ryanne, MD   1 mg at 03/05/24 0058    lidocaine (ASPERCREME) 4 % 1 patch  1 patch Transdermal Daily Buckley, Ryanne, MD   1 patch at 03/04/24 1127    lidocaine (ASPERCREME) 4 % 1 patch  1 patch Transdermal Daily Buckley, Ryanne, MD   1 patch at 03/04/24 1127    lidocaine (ASPERCREME) 4 % 1 patch  1 patch Transdermal Daily Jabier Martens A, MD   1 patch at 03/04/24 1126    magnesium citrate solution 148 mL  148 mL Oral Daily PRN Jori Newer, MD        melatonin tablet 3 mg  3 mg Oral Nightly PRN Arther Larve, MD   3 mg at 03/02/24 2310    multivitamins, therapeutic with minerals tablet 1 tablet  1 tablet Oral Daily Buckley, Ryanne, MD   1 tablet at 03/04/24 1128    naloxegol (MOVANTIK) tablet 25 mg  25 mg Oral Daily Jori Newer, MD   25 mg at 03/04/24 1128    naloxone (NARCAN) injection 0.1 mg  0.1 mg Intravenous Q5 Min PRN Arther Larve, MD        ondansetron (ZOFRAN-ODT) disintegrating tablet 4 mg  4 mg Oral Q8H PRN Jabier Martens A, MD   4 mg at 03/03/24 0044    oxyCODONE (ROXICODONE) immediate release tablet 10 mg  10 mg Oral Q4H PRN Buckley, Ryanne, MD   10 mg at 03/02/24 1809    Or    oxyCODONE (ROXICODONE) immediate release tablet 15 mg  15 mg Oral Q4H PRN Buckley, Ryanne, MD   15 mg at 03/04/24 2224    polyethylene glycol (MIRALAX) packet 17 g  17 g Oral BID Buckley, Ryanne, MD   17 g at 03/04/24 2212    prochlorperazine (COMPAZINE) injection 2.5 mg  2.5 mg Intravenous Q8H PRN Arther Larve, MD   2.5 mg at 03/03/24 1610    Or prochlorperazine (COMPAZINE) injection 5 mg  5 mg Intravenous Q8H PRN Arther Larve, MD        senna Alonna Art) tablet 2 tablet  2 tablet Oral BID Georjean Kite, MD   2 tablet at 03/04/24 2212    spironolactone (ALDACTONE) tablet 25 mg  25 mg Oral Daily Arther Larve, MD  25 mg at 03/04/24 1128   [4]   Patient Active Problem List  Diagnosis    Benign essential hypertension    Primary osteoarthritis of right knee    Idiopathic peripheral neuropathy    MR (mitral regurgitation)    Type 2 diabetes mellitus, without long-term current use of insulin      Neck pain    Encounter for subsequent annual wellness visit (AWV) in Medicare patient    Lipoma of torso    Skin lesion    Hearing loss    Lichen sclerosus et atrophicus of the vulva    Renal artery stenosis, native, bilateral (CMS-HCC)    Hyperlipidemia, unspecified    Aftercare following joint replacement    Presence of left artificial knee joint    Hypokalemia    History of chest pain    Decreased GFR    Nonrheumatic aortic valve insufficiency    Endometrial thickening on ultrasound    Weakness of both hips    Right hip pain    Hot flashes    Aortic valve disease    Post-nasal drip    Witnessed episode of apnea    Dry eye syndrome, bilateral    PVD (posterior vitreous detachment), bilateral    S/P cataract extraction and insertion of intraocular lens, left    S/P cataract extraction and insertion of intraocular lens, right    Meibomian gland dysfunction (MGD), bilateral, both upper and lower lids    Left flank pain    Malignant neoplasm of upper lobe of right lung      Drug-induced constipation    Uncontrolled pain    Metastatic malignant neoplasm

## 2024-03-05 NOTE — Unmapped (Signed)
 Internal Medicine (MEDL) Progress Note    Assessment & Plan:   Kayla Knight is a 83 y.o. 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 Pt had recent 40 lb weight loss and night sweats over the past 1-2 months. PET Scan 5/15 showed centrally located right upper lobe lung mass and hypermetabolic adrenal glands, R mediastinal/subcarinal lymph nodes, lytic lesions, soft tissues of the right common iliac, and left paraspinal region above L5, and in the left gluteus maximus muscle, suspicious for malignancy. PET also showed a small right pleural effusion. MRI thoracic spine, with T9 vertebral body lesion on the R with mass effect on R T9-T10 neural foramen.   - Oncology consulted, appreciate recs   -  (5/31) Brain MRI without evidence of metastasis   - Agree with biopsy for tissue diagnosis, whichever site can be accessed sooner  - VIR consulted for biopsy, appreciate recs:   - s/p bone biopsy of iliac site on 6/3; pending results  - Interventional pulmonology consulted; appreciate recs:   - IP will follow-up with patient Re: EBUS pending bone biopsy path results  - Consulted orthopedic surgery re: spinal mets   - Pending: CT w/o contrast of thoracic/lumbar spine, MRI w/wo contrast cervical spine, Hip X-rays to evaluate spinal stability given mets   - No acute surgical intervention required   - T9 lesion on R side, likely not contributing to her ongoing hip pain  - Nutrition consult   - PT/OT    Uncontrolled Abdominal Pain - Cancer related pain - N/V  Some of her pain regions seem to correlate with hypermetabolic regions from PET while some of her other pain seems related to constipation. She was previously well controlled with 5mg  of oxycodone q6 hours but pain load has increased over the last 3 weeks with a functional decline. The day prior to admission, she required 10-15 mg oxycodone q4-6h for pain management. Due to inadequate pain control, outpatient IP clinic sent her to [facility] for better pain management and expedited workup for malignancy. Abdominal pain briefly improved early during admission with several bowel movements.  Continue to have MSK/cancer related pain particularly when changing position.  Over the past several days patient continues to feel weak and endorses poorly controlled pain particularly in left hip and abdominal area.  Having small loose bowel movements over the past 3 days.  KUB with findings concerning for ileus, likely secondary to opiates. At this point, we think that the pain is likely due  to constipation/ileus.  - Palliative care consulted, appreciate recs-   - Continue duloxetine 20 mg daily for neuropathic pain  - Acetaminophen 1000 mg q8  - Scheduled lidocaine patches  - Oxycodone 10-15mg  q4 PRN moderate pain   - Dilaudid 0.5 mg q4 PRN severe pain   - Continue Butrans 5mg  patch q7days  - Gabapentin 100mg  nightly (started 6/7)  -Referral placed to oncology palliative care on discharge  - PO Zofran 4 mg Q8h PRN   - PO Compazine 2.5 mg PRN  - See below regarding bowel regimen    Limited Mobility   A Semi-Electric hospital bed with electric-powered adjustments to lower and raise head and foot is medically necessary because the patient requires frequent changes in body pain due to uncontrolled pain from metastatic cancer as well as frequent changes in body position to prevent/treat skin breakdown.     Opioid-induced constipation - Ileus  Patient with worsening constipation and abdominal pain in the last couple weeks since starting opioids. Suspect that this is a contributor to patient's acute pain symptoms. At home, she was getting Miralax BID and mag citrate BID per daughter, with more regular BM and improved pain. She received smog enema in the ED with immediate improvement of her pain. CT Abd/Pelvis showing multiple lytic lesions corresponding to PET, otherwise no evidence of malignancy within the abdomen or pelvis. Patient's constipation and abdominal pain improving with resumption of home bowel regimen as below.  Briefly held bowel regimen but uptitrated as of 6/5.  KUB on 6/4 with diffuse gas-filled loops can be seen in ileus. Mild colonic stool burden.  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 Patient has no neuro deficits, neuro exam is grossly intact. Risk factors include HTN, pre-diabetes, small vessel disease and likely malignancy. Was previously prescribed Repatha for HLD, per daughter patient has not been taking medications for several months. Patient unsure when she last had Repatha injection (last dispensed 12/03/2023). Documented hx that statin did not control HLD in the past, unable to recall adverse symptoms related to statin. Neuro consulted on 5/31, appreciate recommendations. TTE -ve, carotid dopplers < 50% stenosis bilaterally.    - Plavix genotyping intermediate, discussed with neuro okay for single agent therapy (ASA or Plavix) from a stroke perspective  - DAPT resumed after bone biopsy  - Will resume Repatha for HLD on discharge- daughter concerned patient was not taking consistently at home, consider addition of statin too if LDL remains elevated.   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 BP on admission is elevated 165/72 likely secondary to uncontrolled pain. Last dose of plavix 5/27 in AM.  Last seen by cardiology (Dr. Tilda Fogo) in 07/2023, advised to continue DAPT with likely transition to single agent at 1 year follow-up in 07/2024. Plavix genotype intermediate. Advised to touch base with Dr. Tilda Fogo on discharge (out of contact until 6/12) regarding DAPT, anticipate she could transition to ASA alone for both stent and stroke.  - Amlodipine 10 mg daily   - Spironolactone 25 mg daily   - Continue ASA and Plavix    Chronic  Problems  T2DM  Well controlled w/o medications. Most recent A1c 5.7 on 10/22.  - CTM    HLD  Continue home evolocumab, of note daughter concerned that patient was not taking medications as prescribed for past couple of months until she moved in with family several weeks ago.      Diverticulitis  Recent colonoscopy on 02/11/2024 showed 6 mm polyp, diverticulosis without malignancy, active bleeding.   - CTM      Asthma   - Albuterol Q6h PRN       The patient's presentation is complicated by the following clinically significant conditions requiring additional evaluation and treatment: - Age related debility POA requiring additional resources: DME, PT, or OT  - Metastatic cancer POA requiring further investigation, treatment, or monitoring    Issues Impacting Complexity of Management:  -High risk of complications from pain and/or analgesia likely to result in delirium      Medical Decision Making: Discussed the patient's management and/or test interpretation with Pulmonology and interventional radiology as summarized within this note      Daily Checklist:  Diet: Regular Diet  DVT PPx: Lovenox 40mg  q24h  Electrolytes: Replete Potassium to >/= 3.6 and Magnesium to >/= 1.8  Code Status: DNR and DNI  Dispo: Goal Discharge:  SNF pending pain control    Team Contact Information:   Primary Team: Internal Medicine (MEDL)  Primary Resident: Georjean Kite, MD  Resident's Pager: [phone] (Gen MedL Intern - [facility])    Interval History:   Patient reports that she slept a significant portion of yesterday and overnight. Had 2 good BM with maybe some mild improvement in her abdominal discomfort but continues to have sharp left hip pain (originating in her mid-lower back and radiating down her left leg). Got 1x of IV dilaudid @[facility]  iso discomfort due to x-rays being obtained. Plans to get another dose of dilaudid prior to her CT scans later today.  Of note patient did have a full dinner yesterday with salmon, vegetables, and dessert.  Reports that loved ones are coming from out of town to visit her    All other systems were reviewed and are negative except as noted in the HPI    Objective:   Temp:  [36.6 ??C (97.8 ??F)-36.8 ??C (98.2 ??F)] 36.7 ??C (98.1 ??F)  Pulse:  [78-84] 84  Resp:  [16-20] 16  BP: (146-164)/(67-77) 160/77  SpO2:  [92 %-98 %] 96 %    Gen: NAD, laying in bed, uncomfortable appearing  HENT: atraumatic, normocephalic  Heart: RRR, systolic ejection murmur prominent at RUSB   Lungs: CTAB, no crackles or wheezes  Abdomen: soft, non-tender and non-distended  Extremities: No edema  Neuro: No focal deficits    Jabier Martens, MD  Internal Medicine, PGY-1

## 2024-03-05 NOTE — Unmapped (Signed)
 Shift Summary  Pain level decreased after administration of pain medications, indicating improved pain management.    No falls or injuries occurred, with consistent fall risk interventions in place.    Moderate assistance was needed for hygiene and bathing, but some independence was noted in bed mobility.    Peripheral IV site remained clean, dry, and intact with no interventions needed.    Overall, the patient showed improvement in pain control and maintained safety without falls.     Absence of Fall and Fall-Related Injury: No falls or fall-related injuries occurred during the shift, with consistent use of fall risk interventions such as hourly visual checks and toilet assistance every two hours.     Optimal Pain Control and Function: Pain level decreased from 9 to a lower level after administration of oxyCODONE and HYDROmorphone (PF) in the IMG DIAG X-RAY [facility], indicating improved pain control.     Improved Ability to Complete Activities of Daily Living: Moderate assistance was required for hygiene and bathing, but the patient was able to turn herself in bed, indicating some level of independence.

## 2024-03-05 NOTE — Unmapped (Signed)
 Problem: Malnutrition  Goal: Improved Nutritional Intake  Outcome: Shift Focus   Shift Summary  Pain management was challenging, with severe abdominal pain persisting despite medication.    Vomiting was addressed with ondansetron administration.    The patient was able to feed herself, but nutritional intake was still inadequate.    Safety measures were consistently applied, preventing any hospital-acquired injuries.    Overall, the patient's condition remained stable, but pain control needs further attention.   Large emesis, zofran IV given x1. Decreased PO intake today. Dilaudid given x1 prior to testing.    Improved Nutritional Intake: Nutritional intake remained probably inadequate throughout the shift, with the patient able to feed herself consistently.     Absence of Hospital-Acquired Illness or Injury: No new hospital-acquired illnesses or injuries were noted, and safety measures such as side rails and regular toileting were maintained.     Optimal Comfort and Wellbeing: Abdominal pain remained constant and severe, with a pain score of 10, despite administration of HYDROmorphone (PF) and oxyCODONE.

## 2024-03-05 NOTE — Unmapped (Signed)
 Orthopaedic Surgery Progress Note:    Assessment:     Quenna Doepke is a 83 y.o. female with a history renal artery stenosis, type 2 diabetes, hypertension, aortic valve insufficiency, left TKA who is admitted for metastatic neoplasia of unknown origin.  Their orthopaedic injuries include Bilateral proximal femur lesions, left ilium and pubic rami lesion.    Objective:    RLE: No swelling, ecchymoses, deformity, or effusion. Skin intact.  No tenting, impending open fracture. Nontender to palpation, with full and painless ROM throughout. + GS/TA/EHL. SILT in DP/SP/S/S/T distributions.  2+ DP pulse with warm and well perfused toes. Compartments soft and compressible, with no pain on passive stretch.       LLE: No swelling, ecchymoses, deformity, or effusion. Skin intact. No tenting, impending open fracture.Nontender to palpation, with full and painless ROM throughout. + GS/TA/EHL. SILT in DP/SP/S/S/T distributions. 2+ DP pulse with warm and well perfused toes. Compartments soft and compressible, with no pain on passive stretch.      Plan:     At this time, we will plan for non-operative management but will confirm with our onc-ortho providers. She may weightbear as tolerated.    Jeffory Mings, MD, PhD  [facility] Orthopaedic Surgery, PGY-3

## 2024-03-06 LAB — BASIC METABOLIC PANEL
ANION GAP: 11 mmol/L (ref 5–14)
BLOOD UREA NITROGEN: 12 mg/dL (ref 9–23)
BUN / CREAT RATIO: 16
CALCIUM: 10.2 mg/dL (ref 8.7–10.4)
CHLORIDE: 96 mmol/L — ABNORMAL LOW (ref 98–107)
CO2: 27 mmol/L (ref 20.0–31.0)
CREATININE: 0.74 mg/dL (ref 0.55–1.02)
EGFR CKD-EPI (2021) FEMALE: 81 mL/min/1.73m2 (ref >=60)
GLUCOSE RANDOM: 103 mg/dL (ref 70–179)
POTASSIUM: 4.4 mmol/L (ref 3.4–4.8)
SODIUM: 134 mmol/L — ABNORMAL LOW (ref 135–145)

## 2024-03-06 LAB — CBC
HEMATOCRIT: 30.3 % — ABNORMAL LOW (ref 34.0–44.0)
HEMOGLOBIN: 10.2 g/dL — ABNORMAL LOW (ref 11.3–14.9)
MEAN CORPUSCULAR HEMOGLOBIN CONC: 33.6 g/dL (ref 32.0–36.0)
MEAN CORPUSCULAR HEMOGLOBIN: 23.2 pg — ABNORMAL LOW (ref 25.9–32.4)
MEAN CORPUSCULAR VOLUME: 69.2 fL — ABNORMAL LOW (ref 77.6–95.7)
MEAN PLATELET VOLUME: 6.4 fL — ABNORMAL LOW (ref 6.8–10.7)
PLATELET COUNT: 798 10*9/L — ABNORMAL HIGH (ref 150–450)
RED BLOOD CELL COUNT: 4.39 10*12/L (ref 3.95–5.13)
RED CELL DISTRIBUTION WIDTH: 16.7 % — ABNORMAL HIGH (ref 12.2–15.2)
WBC ADJUSTED: 9.7 10*9/L (ref 3.6–11.2)

## 2024-03-06 LAB — MAGNESIUM: MAGNESIUM: 2.3 mg/dL (ref 1.6–2.6)

## 2024-03-06 MED ADMIN — lidocaine (ASPERCREME) 4 % 1 patch: 1 | TRANSDERMAL | @ 13:00:00

## 2024-03-06 MED ADMIN — acetaminophen (TYLENOL) tablet 1,000 mg: 1000 mg | ORAL | @ 18:00:00

## 2024-03-06 MED ADMIN — HYDROmorphone (PF) (DILAUDID) injection 1 mg: 1 mg | INTRAVENOUS | @ 16:00:00 | Stop: 2024-03-07

## 2024-03-06 MED ADMIN — acetaminophen (TYLENOL) tablet 1,000 mg: 1000 mg | ORAL | @ 10:00:00

## 2024-03-06 MED ADMIN — SMOG ENEMA: 240 mL | RECTAL | @ 21:00:00 | Stop: 2024-03-06

## 2024-03-06 MED ADMIN — buprenorphine 10 mcg/hour transdermal patch 1 patch: 1 | TRANSDERMAL | @ 18:00:00

## 2024-03-06 MED ADMIN — amlodipine (NORVASC) tablet 10 mg: 10 mg | ORAL | @ 13:00:00

## 2024-03-06 MED ADMIN — spironolactone (ALDACTONE) tablet 25 mg: 25 mg | ORAL | @ 13:00:00

## 2024-03-06 MED ADMIN — aspirin chewable tablet 81 mg: 81 mg | ORAL | @ 13:00:00

## 2024-03-06 MED ADMIN — clopidogrel (PLAVIX) tablet 75 mg: 75 mg | ORAL | @ 13:00:00 | Stop: 2024-03-06

## 2024-03-06 MED ADMIN — oxyCODONE (ROXICODONE) immediate release tablet 15 mg: 15 mg | ORAL | @ 13:00:00 | Stop: 2024-03-08

## 2024-03-06 MED ADMIN — oxyCODONE (ROXICODONE) immediate release tablet 15 mg: 15 mg | ORAL | @ 01:00:00 | Stop: 2024-03-08

## 2024-03-06 MED ADMIN — DULoxetine (CYMBALTA) DR capsule 40 mg: 40 mg | ORAL | @ 13:00:00

## 2024-03-06 MED ADMIN — senna (SENOKOT) tablet 2 tablet: 2 | ORAL | @ 01:00:00

## 2024-03-06 MED ADMIN — enoxaparin (LOVENOX) syringe 40 mg: 40 mg | SUBCUTANEOUS | @ 01:00:00

## 2024-03-06 MED ADMIN — naloxegol (MOVANTIK) tablet 25 mg: 25 mg | ORAL | @ 13:00:00

## 2024-03-06 MED ADMIN — acetaminophen (TYLENOL) tablet 1,000 mg: 1000 mg | ORAL | @ 01:00:00

## 2024-03-06 MED ADMIN — multivitamins, therapeutic with minerals tablet 1 tablet: 1 | ORAL | @ 13:00:00

## 2024-03-06 MED ADMIN — gabapentin (NEURONTIN) capsule 100 mg: 100 mg | ORAL | @ 01:00:00

## 2024-03-06 NOTE — Unmapped (Signed)
 Internal Medicine (MEDL) Progress Note    Assessment & Plan:   Breniyah Romm is a 83 y.o. 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 Pt had recent 40 lb weight loss and night sweats over the past 1-2 months. PET Scan 5/15 showed centrally located right upper lobe lung mass and hypermetabolic adrenal glands, R mediastinal/subcarinal lymph nodes, lytic lesions, soft tissues of the right common iliac, and left paraspinal region above L5, and in the left gluteus maximus muscle, suspicious for malignancy. PET also showed a small right pleural effusion. MRI thoracic spine, with T9 vertebral body lesion on the R with mass effect on R T9-T10 neural foramen. CT and MRI findings with known metastatic disease with associated pathological fractures T9, L4-S1, R iliac bone  - Oncology consulted, appreciate recs   -  (5/31) Brain MRI without evidence of metastasis   - Will touch base with oncology given imaging findings and biopsy results  - VIR consulted for biopsy, appreciate recs:   - s/p bone biopsy of iliac site on 6/3; metastatic carcinoma, origin site remains unclear (potential sites include lung, upper GI, pancreatobiliary, or less likely lower GI tract, breast, gyn/GU, and thyroid)  - Interventional pulmonology consulted; appreciate recs:   - IP will follow-up with patient Re: EBUS pending bone biopsy path results  - Consulted orthopedic surgery re: spinal lytic lesions- no acute surgical intervention  - Consult rad onc re: palliative radiation  - Consult VIR re: kyphoplasty for T9 lesion  - Nutrition consult   - PT/OT- family would ideally like patient to return home but are considering SNF options    Uncontrolled Cancer related pain - N/V  Some of her pain regions seem to correlate with hypermetabolic regions from PET while some of her other pain seems related to constipation. She was previously well controlled with 5mg  of oxycodone q6 hours but pain load has increased over the last 3 weeks with a functional decline. The day prior to admission, she required 10-15 mg oxycodone q4-6h for pain management. Due to inadequate pain control, outpatient IP clinic sent her to [facility] for better pain management and expedited workup for malignancy. Abdominal pain briefly improved early during admission with several bowel movements.  Continue to have MSK/cancer related pain particularly when changing position starting in midline back and radiating down the L hip/leg.  - Palliative care consulted, appreciate recs-   - Duloxetine 40 mg daily for neuropathic pain  - Acetaminophen 1000 mg q8  -  Scheduled lidocaine patches  - Oxycodone 10-15mg  q4 PRN moderate pain   - Dilaudid 0.5 mg q4 PRN severe pain   - Increase Butrans 10mg  patch q7days  - Gabapentin 200mg  nightly  - Start dexamethasone 2mg    - Referral placed to oncology palliative care on discharge  - PO Zofran 4 mg Q8h PRN   - PO Compazine 2.5 mg PRN  - See below regarding bowel regimen    Limited Mobility   A Semi-Electric hospital bed with electric-powered adjustments to lower and raise head and foot is medically necessary because the patient requires frequent changes in body pain due to uncontrolled pain from metastatic cancer as well as frequent changes in body position to prevent/treat skin breakdown.     Opioid-induced constipation - Ileus  Patient with worsening constipation and abdominal pain in the last couple weeks since starting opioids. Suspect that this is a contributor to patient's acute pain symptoms. At home, she was getting Miralax BID and mag citrate BID per daughter, with more regular BM and improved pain. She received smog enema in the ED with immediate improvement of her pain. CT Abd/Pelvis showing multiple lytic lesions corresponding to PET, otherwise no evidence of malignancy within the abdomen or pelvis. Patient's constipation and abdominal pain improving with resumption of home bowel regimen as below.  Briefly held bowel regimen but uptitrated as of 6/5.  KUB on 6/4 with diffuse gas-filled loops can be seen in ileus. Mild colonic stool burden.  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 Patient has no neuro deficits, neuro exam is grossly intact. Risk factors include HTN, pre-diabetes, small vessel disease and likely malignancy. Was previously prescribed Repatha for HLD, per daughter patient has not been taking medications for several months. Patient unsure when she last had Repatha injection (last dispensed 12/03/2023). Documented hx that statin did not control HLD in the past, unable to recall adverse symptoms related to statin. Neuro consulted on 5/31, appreciate recommendations. TTE -ve, carotid dopplers < 50% stenosis bilaterally. Plavix genotyping intermediate, discussed with neuro okay for single agent therapy (ASA or Plavix) from a stroke perspective  - DAPT resumed after bone biopsy- reasonable to transition to ASA on discharge  - Will resume Repatha for HLD on discharge- daughter concerned patient was not taking consistently at home, consider addition of statin too if LDL remains elevated.   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 BP on admission is elevated 165/72 likely secondary to uncontrolled pain. Last dose of plavix 5/27 in AM.  Last seen by cardiology (Dr. Tilda Fogo) in 07/2023, advised to continue DAPT with likely transition to single agent at 1 year follow-up in 07/2024, of note appeared to be carried over from prior note in 2023. Plavix genotype intermediate. Advised to touch base with Dr. Tilda Fogo on discharge (out of contact until 6/12) regarding DAPT, anticipate she could transition to ASA alone for both stent and stroke.  - Amlodipine 10 mg daily   - Spironolactone 25 mg daily   - Continue ASA and Plavix    Chronic Problems  T2DM  Well controlled w/o medications. Most recent A1c 5.7 on 10/22.  - CTM    HLD  Continue home evolocumab, of note daughter concerned that patient was not taking medications as prescribed for past couple of months until she moved in with family several weeks ago.      Diverticulitis  Recent colonoscopy on 02/11/2024 showed 6 mm polyp, diverticulosis without malignancy, active bleeding.   - CTM      Asthma   - Albuterol Q6h PRN       The patient's presentation is complicated by the following clinically significant conditions requiring additional evaluation and treatment: - Age related debility POA requiring additional resources: DME, PT, or OT  - Metastatic cancer POA requiring further investigation, treatment, or monitoring    Issues Impacting Complexity of Management:  -High risk of complications from pain and/or analgesia likely to result in delirium      Medical Decision Making: Discussed the patient's management and/or test interpretation with Pulmonology and interventional radiology as summarized within this note      Daily Checklist:  Diet: Regular Diet  DVT PPx: Lovenox 40mg  q24h  Electrolytes: Replete Potassium to >/= 3.6 and Magnesium to >/= 1.8  Code Status: DNR and DNI  Dispo: Goal Discharge:  SNF pending pain control    Team Contact Information:   Primary Team: Internal Medicine (MEDL)  Primary Resident: Georjean Kite, MD  Resident's Pager: [phone] (Gen MedL Intern - Tower)    Interval History:   Patient resting in bed this AM. Not able to participate in PT much this AM secondary to pain. Had episode of N/V yesterday, mainly attributed to dilaudid. Continues to endorse intermittent episodes of midline back pain with radiation down to the left hip and leg, continues to endorse poorly controlled pain.     All other systems were reviewed and are negative except as noted in the HPI    Objective:   Temp:  [36.7 ??C (98.1 ??F)-36.8 ??C (98.2 ??F)] 36.8 ??C (98.2 ??F)  Pulse:  [78-91] 83  Resp:  [18-19] 18  BP: (160-183)/(76-81) 160/79  SpO2:  [91 %-99 %] 96 %    Gen: NAD, laying in bed, uncomfortable appearing  HENT: atraumatic, normocephalic  Heart: RRR, systolic ejection murmur prominent at RUSB   Lungs: CTAB, no crackles or wheezes  Abdomen: soft, non-tender and non-distended  Extremities: No edema  Neuro: No focal deficits    Jabier Martens, MD  Internal Medicine, PGY-1

## 2024-03-06 NOTE — Unmapped (Cosign Needed)
 [employer]   DIVISION OF INTERVENTIONAL RADIOLOGY CONSULTATION       IR Consultation Note         HPI:  83 y.o. 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 Biopsy of iliac lesion by VIR on 6/3 demonstrates metastatic carcinoma of unknown origin.  Patient with considerable hip and back pain with numerous osseous metastatic lesions.  We are consulted for kyphoplasty of T9 vertebral lesion.  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 The PLL does not appear intact at this level.    PAST MEDICAL HISTORY:  Past Medical History[1]    PAST SURGICAL HISTORY:  Past Surgical History[2]    SOCIAL HISTORY:  Social History [3]    MEDICATIONS:   Current Medications[4]    ALLERGIES:  Allergies[5]        PHYSICAL EXAM:  Vitals:    03/06/24 1015   BP: 160/74   Pulse: 78   Resp: 18   Temp: 36.7 ??C (98.1 ??F)   SpO2: 95%       ASA Grade: ASA 3 - Patient with moderate systemic disease with functional limitations       Pertinent Labs:     WBC   Date Value Ref Range Status   03/06/2024 9.7 3.6 - 11.2 10*9/L Final   10/20/2018 6.2 3.8 - 10.8 K/uL Final     HGB   Date Value Ref Range Status   03/06/2024 10.2 (L) 11.3 - 14.9 g/dL Final   96/78/9381 01.7 11.3 - 15.5 g/dL Final     HCT   Date Value Ref Range Status   03/06/2024 30.3 (L) 34.0 - 44.0 % Final   10/20/2018 40.3 35.0 - 45.0 % Final     Platelet   Date Value Ref Range Status   03/06/2024 798 (H) 150 - 450 10*9/L Final   10/20/2018 343 140 - 440 K/uL Final     INR   Date Value Ref Range Status   02/22/2024 1.03  Final     Creatinine   Date Value Ref Range Status   03/06/2024 0.74 0.55 - 1.02 mg/dL Final   51/10/5850 0.8 0.5 - 1.5 mg/dL Final       Imaging: MRI of the thoracic spine reviewed as noted in the HPI          Anticoaguation: Yes  If yes, type of anticoagulation Aspirin 81 mg, Plavix    ASSESSMENT & PLAN:    83 y.o.female with pathologic compression fracture at the T9 level with retropulsion and without intact PLL. Additionally, there is cord changes at this level. Unfortunately, this level is not amenable to kyphoplasty    The patient was discussed with Dr. Tanis Fan.     Thank you for involving us  in the care of this patient. Please page the VIR consult pager [phone]) with further questions, concerns, or if new issues arise.  Maximiano Spanish, MD, March 06, 2024, 4:15 PM                       [1]   Past Medical History:  Diagnosis Date    Anemia     past history, over 40 yrs ago    Cataract 2008    Surgery    Diabetes mellitus        Type II, on 9/15 patient stated she was not longer diabetic, not taking meds    Dry eye     Hearing impairment     Heart murmur 2016    [facility] Jacquiline [facility]ul [facility]    Heart valve disease     Heart mumur, pt reports diagnosis 10 yrs ago (05/10/20)    History of stent insertion of renal artery 01/29/2020    ASA indefinitely and plavix x 6 months per procedure note    Hypertension     pt reports taking meds since about 1995    Metastatic malignant neoplasm   02/22/2024    Neuromuscular disorder    2014    Feet & Legs    Tear of meniscus of knee     4.5 yrs ago    Urinary incontinence     seen a doctor about nighttime bathroom use (adressed in 2019)   [2]   Past Surgical History:  Procedure Laterality Date    CATARACT EXTRACTION W/ INTRAOCULAR LENS IMPLANT Left 02/27/2016    EYE SURGERY  01/30/16    Cataract surgery is scheduled.    JOINT REPLACEMENT  Knee    KNEE CARTILAGE SURGERY Right 2007, 2008    x 2    KNEE SURGERY      pt reports total left knee joint replacement surgery around 2016, 2017 PLANTAR FASCIECTOMY Left     PR COLONOSCOPY FLX DX W/COLLJ SPEC WHEN PFRMD N/A 02/11/2024    Procedure: COLONOSCOPY, FLEXIBLE, PROXIMAL TO SPLENIC FLEXURE; DIAGNOSTIC, W/WO COLLECTION SPECIMEN BY BRUSH OR WASH;  Surgeon: Earl Glassing, MD;  [facility]: [facility];  Service: Gastroenterology    PR REVASCULARIZE FEM/POP ARTERY,ANGIOPLASTY/STENT N/A 01/29/2020    Procedure: Peripheral Angiography W Intervetion;  Surgeon: Harvie Liner, MD;  Location: [facility] CATH;  Service: Cardiology    PR REVASCULARIZE FEM/POP ARTERY,ANGIOPLASTY/STENT Right 12/26/2021    Procedure: right renal artery intervention;  Surgeon: Harvie Liner, MD;  Location: [facility] CATH;  Service: Cardiology    PR UPPER GI ENDOSCOPY,BIOPSY N/A 04/01/2021    Procedure: UGI ENDOSCOPY; WITH BIOPSY, SINGLE OR MULTIPLE;  Surgeon: Earl Glassing, MD;  Location: [facility]ED[facility];  Service: Gastroenterology    PR XCAPSL CTRC RMVL INSJ IO LENS PROSTH W/O ECP Right 02/13/2016    Procedure: EXTRACAPSULAR CATARACT REMOVAL W/INSERTION OF INTRAOCULAR LENS PROSTHESIS, MANUAL OR MECHANICAL TECHNIQUE;  Surgeon: Betha Brookes, MD;  Location: [facility];  Service: Ophthalmology    PR XCAPSL CTRC RMVL INSJ IO LENS PROSTH W/O ECP Left 02/27/2016    Procedure: EXTRACAPSULAR CATARACT REMOVAL W/INSERTION OF INTRAOCULAR LENS PROSTHESIS, MANUAL OR MECHANICAL TECHNIQUE;  Surgeon: Betha Brookes, MD;  Location: [facility] OR [facility];  Service: Ophthalmology    RENAL ARTERY STENT  01/11/2020    ROOT CANAL      TUBAL LIGATION  1973    [facility]    WISDOM TOOTH EXTRACTION     [3]   Social History  Socioeconomic History    Marital status: Single   Tobacco Use    Smoking status: Former     Current packs/day: 0.00     Types: Cigarettes     Quit date: 05/19/1984     Years since quitting: 39.8  Passive exposure: Past    Smokeless tobacco: Never   Vaping Use    Vaping status: Never Used   Substance and Sexual Activity    Alcohol use: Yes     Comment: occassional    Drug use: Never    Sexual activity: Not Currently     Partners: Male   Social History Narrative    Exercise: water aerobics 3-4 x week for an hour        Eye: Dr.Merrit @ [facility] 2018        Dentist: [facility] 2018     Social Drivers of Health     Financial Resource Strain: Low Risk  (02/23/2024)    Overall Financial Resource Strain (CARDIA)     Difficulty of Paying Living Expenses: Not very hard   Recent Concern: Financial Resource Strain - Medium Risk (02/10/2024)    Overall Financial Resource Strain (CARDIA)     Difficulty of Paying Living Expenses: Somewhat hard   Food Insecurity: No Food Insecurity (02/23/2024)    Hunger Vital Sign     Worried About Running Out of Food in the Last Year: Never true     Ran Out of Food in the Last Year: Never true   Transportation Needs: No Transportation Needs (02/23/2024)    PRAPARE - Therapist, art (Medical): No     Lack of Transportation (Non-Medical): No   Physical Activity: Sufficiently Active (01/04/2023)    Exercise Vital Sign     Days of Exercise per Week: 7 days     Minutes of Exercise per Session: 30 min   Stress: No Stress Concern Present (01/04/2023)    [employer] of Occupational Health - Occupational Stress Questionnaire     Feeling of Stress : Only a little   Social Connections: Moderately Isolated (01/04/2023)    Social Connection and Isolation Panel     Frequency of Communication with Friends and Family: More than three times a week     Frequency of Social Gatherings with Friends and Family: More than three times a week     Attends Religious Services: Never     Database administrator or Organizations: Yes     Attends Engineer, structural: More than 4 times per year     Marital Status: Divorced   Housing: Low Risk  (02/23/2024)    Housing     Within the past 12 months, have you ever stayed: outside, in a car, in a tent, in an overnight shelter, or temporarily in someone else's home (i.e. couch-surfing)?: No     Are you worried about losing your housing?: No   [4]   Current Facility-Administered Medications:     acetaminophen (TYLENOL) tablet 1,000 mg, 1,000 mg, Oral, Q8H, Arther Larve, MD, 1,000 mg at 03/06/24 1401    albuterol (PROVENTIL HFA;VENTOLIN HFA) 90 mcg/actuation inhaler 2 puff, 2 puff, Inhalation, Q6H PRN, Arther Larve, MD    aluminum-magnesium hydroxide-simethicone (MAALOX MAX) 80-80-8 mg/mL oral suspension, 30 mL, Oral, Q4H PRN, Arther Larve, MD    amlodipine (NORVASC) tablet 10 mg, 10 mg, Oral, Daily, Arther Larve, MD, 10 mg at 03/06/24 2956    aspirin chewable tablet 81 mg, 81 mg, Oral, Daily, Jabier Martens A, MD, 81 mg at 03/06/24 0849    buprenorphine 10 mcg/hour transdermal patch 1 patch, 1 patch, Transdermal, Q7 Days, Jabier Martens A, MD, 1 patch at 03/06/24 1401    clopidogrel (PLAVIX) tablet 75 mg, 75 mg, Oral,  Daily, Jabier Martens A, MD, 75 mg at 03/06/24 0849    [START ON 03/07/2024] dexAMETHasone (DECADRON) tablet 2 mg, 2 mg, Oral, Daily, Tama Fails, Christian A, MD    dextrose 50 % in water (D50W) 50 % solution 12.5 g, 12.5 g, Intravenous, Q15 Min PRN, Arther Larve, MD    DULoxetine (CYMBALTA) DR capsule 40 mg, 40 mg, Oral, Daily, Faustine Hoof, MD, 40 mg at 03/06/24 0849    enoxaparin (LOVENOX) syringe 40 mg, 40 mg, Subcutaneous, Q24H, Buckley, Ryanne, MD, 40 mg at 03/05/24 2101    gabapentin (NEURONTIN) capsule 200 mg, 200 mg, Oral, Nightly, Tama Fails, Christian A, MD    glucagon injection 1 mg, 1 mg, Intramuscular, Once PRN, Arther Larve, MD    glucose chewable tablet 16 g, 16 g, Oral, Q10 Min PRN, Arther Larve, MD    guaiFENesin (ROBITUSSIN) oral syrup, 200 mg, Oral, Q4H PRN, Arther Larve, MD    HYDROmorphone (PF) (DILAUDID) injection 1 mg, 1 mg, Intravenous, Q4H PRN, Buckley, Ryanne, MD, 1 mg at 03/06/24 1208    lidocaine (ASPERCREME) 4 % 1 patch, 1 patch, Transdermal, Daily, Buckley, Ryanne, MD, 1 patch at 03/06/24 0848 lidocaine (ASPERCREME) 4 % 1 patch, 1 patch, Transdermal, Daily, Buckley, Ryanne, MD, 1 patch at 03/06/24 0848    lidocaine (ASPERCREME) 4 % 1 patch, 1 patch, Transdermal, Daily, Jabier Martens A, MD, 1 patch at 03/06/24 0848    magnesium citrate solution 148 mL, 148 mL, Oral, Daily PRN, Jori Newer, MD    melatonin tablet 3 mg, 3 mg, Oral, Nightly PRN, Arther Larve, MD, 3 mg at 03/02/24 2310    multivitamins, therapeutic with minerals tablet 1 tablet, 1 tablet, Oral, Daily, Buckley, Ryanne, MD, 1 tablet at 03/06/24 0849    naloxegol (MOVANTIK) tablet 25 mg, 25 mg, Oral, Daily, Jori Newer, MD, 25 mg at 03/06/24 0849    naloxone (NARCAN) injection 0.1 mg, 0.1 mg, Intravenous, Q5 Min PRN, Arther Larve, MD    ondansetron (ZOFRAN-ODT) disintegrating tablet 4 mg, 4 mg, Oral, Q8H PRN **OR** ondansetron (ZOFRAN) injection 4 mg, 4 mg, Intravenous, Q8H PRN, Jabier Martens A, MD, 4 mg at 03/05/24 1301    oxyCODONE (ROXICODONE) immediate release tablet 10 mg, 10 mg, Oral, Q4H PRN, 10 mg at 03/02/24 1809 **OR** oxyCODONE (ROXICODONE) immediate release tablet 15 mg, 15 mg, Oral, Q4H PRN, Buckley, Ryanne, MD, 15 mg at 03/06/24 0850    polyethylene glycol (MIRALAX) packet 17 g, 17 g, Oral, BID, Buckley, Ryanne, MD, 17 g at 03/05/24 1351    prochlorperazine (COMPAZINE) injection 2.5 mg, 2.5 mg, Intravenous, Q8H PRN, 2.5 mg at 03/03/24 0622 **OR** prochlorperazine (COMPAZINE) injection 5 mg, 5 mg, Intravenous, Q8H PRN, Arther Larve, MD    senna Alonna Art) tablet 2 tablet, 2 tablet, Oral, BID, Jabier Martens A, MD, 2 tablet at 03/05/24 2057    SMOG ENEMA, 240 mL, Rectal, Once, Jabier Martens A, MD    spironolactone (ALDACTONE) tablet 25 mg, 25 mg, Oral, Daily, Arther Larve, MD, 25 mg at 03/06/24 0849  [5]   Allergies  Allergen Reactions    Penicillins Itching and Swelling    Grass Pollen-June Grass Standard Other (See Comments)     Asthma attack     Lisinopril Cough    Statins-Hmg-Coa Reductase Inhibitors      Ineffective at controlling cholesterol when tried in the past.    Sulfa (Sulfonamide Antibiotics) Itching

## 2024-03-06 NOTE — Unmapped (Signed)
 Palliative Care Progress Note      Consultation from Requesting Attending Physician:  Nolene Baumgarten, MD  Primary Care Provider:  Powell-Tillman, Levonne Genese, MD      Assessment/Plan:      SUMMARY:  This 83 y.o. 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 Palliative care is following for symptom management and support.     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 Constipation likely also contributing.  - Continue multimodal pain control with scheduled tylenol and lidocaine patches; she felt like pain was better controlled with four lidocaine patches, so ok to give her 4  - Increase buprenorphine patch to 10 mcg/hr TD q7 days   - Continue oxycodone 10-15 mg q4H PRN  - Continue IV dilaudid 1 mg q4H PRN for pain refractory to oral meds   - Continue duloxetine to 40 mg daily for nerve related pain  - Increase gabapentin to 200 mg PO nightly   - Consider ibuprofen 400 mg PO TID vs dexamethasone 2 mg PO daily for inflammatory bone lesions  - Please ensure she has a referral to outpatient oncology palliative clinic upon discharge. Discussed that we do not have an appointment at [facility] until mid July, so primary team is reaching out to her PCP to see if they can prescribe her opioids until she sees us  in clinic. We gave them Gregorio Lease phone number and advised them to reach out to us  if issues come up in the interim regarding uncontrolled pain, or they need to reach us  for other reasons. Family is still worried about ability to care for [facility] at home, previous discussion of SNF vs acute rehab vs home with PT services.     Opioid induced constipation   She is having small volume liquid stools, feeling nauseous, has hx of chronic constipation and is now on opioids. Had colonoscopy 5/16 that showed one 6 mm polyp in the cecum, diverticulosis, internal hemorrhoids. Discussed that she likely has constipation with overflow. KUB confirmed air in the colon and moderate stool burden per my review.  - Continue miralax 17g BID (often refusing)   - Increase senna to three tabs BID   - Would add milk of magnesia 30 mL TID until she has a good BM  - Would give prn enema today  - Primary team ordered naloxegol 25 mg daily      Nausea  Intermittent, likely related to constipation. Would treat constipation as above. Prefer to use compazine as first line since it is less constipating than zofran. Discussed that buprenorphine patch can cause nausea (but it started pre-patch).    Goals of care / ACP:  Code Status:   Code Status: DNR and DNI   Healthcare decision-maker if lacks capacity:   HCDM (HCPOA): Eveleen, Mcnear - Daughter - [phone]       Practical, Emotional, Spiritual Support Recommendations:  - Empathetic listening  and support provided during today's encounter.   - Her ultimate goal is to return home to live with her daughter. She hopes to maintain function as much as possible and better pain control and is open to resources that help her be at home.     Recommendations shared with primary team via Epic chat      Thank you for this consult. Please contact Palliative Care if there are any questions.   Palliative Care team plans to visit this patient again 6/10.    Subjective:     Recent Events:      Kayla Knight was seen this AM with daughter at bedside. She appears comfortable in bed but reports that she was unable to participate in PT/OT this morning due to pain and unsteadiness. She did not feel comfortable sitting up in the chair after this. She predominantly feels the pain in her abdomen and down her lateral left leg.     Overall, she is feeling better compared to yesterday. She reports a good night of sleep and has had a good appetite, being able to tolerate PO. She has not felt nausea today but is concerned about pain control. 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 She would like to return to as much of this as possible. She is also looking forward to building a relationship with her second great-grandchild.     Daughter at bedside expressed concern about dispo and medication regimen when Maribeth is discharged. She wants her mother to be pain stable, preferably without opioids due to constipation. She is also concerned that the pain and the opioids are slowing her mother down. They are agreed that home with PT/OT services would be preferable so she can maintain as much privacy, comfort, and independence as possible while getting her the support she needs. She also wishes to remain with her primary care physician, Dr. Powell-Tillman, with whom she has an excellent relationship. They are also concerned about ensuring she has outpatient oncology and palliative care follow up.     Objective:       Function:  70% - Ambulation: Reduced / unable to do normal work, some evidence of disease / Self-Care: Full / Intake: Normal or reduced / Level of Conscious: Full    Temp:  [36.7 ??C (98.1 ??F)-36.8 ??C (98.2 ??F)] 36.7 ??C (98.1 ??F)  Pulse:  [78-91] 78  Resp:  [18-19] 18  BP: (118-183)/(67-81) 160/74  SpO2:  [91 %-99 %] 95 %    Physical Exam:  Constitutional: Laying in bed, comfortably, with occasional attention lapses attributed to sleepiness  Eyes: anicteric sclera, no discharge  ENMT: Dry oral mucosa  Pulm: Breathing comfortably on RA   Abdomen: abdomen mildly tender to palpation   Psych: mood and affect appropriate    Testing reviewed and interpreted:   Reviewed and interpreted test results for anemia, thrombocytosis, hyponatremia affecting assessment of underlying illness severity and prognosis    I personally spent 60 minutes face-to-face and non-face-to-face in the care of this patient, which includes all pre, intra, and post visit time on the date of service.  All documented time was specific to the E/M visit and does not include any procedures that may have been performed.     See ACP Note from today for additional billable service:  No.      Christa Course, MS4    I personally spent 25 minutes  face-to-face and non-face-to-face in the care of this patient, which includes all pre, intra, and post visit time on the date of service.  All documented time was specific to the E/M visit and does not include any procedures that may have been performed. I attest that I have reviewed the student note and that the components of the history of the present illness, the physical exam, and the assessment and plan documented were performed by me or were performed in my presence by the student where I verified the documentation and performed (or re-performed) the exam and medical decision making.     Cheyenne Cotta, MD

## 2024-03-06 NOTE — Unmapped (Signed)
 Radiation Oncology Consult Service - Initial Consult Note    Requesting Attending Physician: Nolene Baumgarten, MD  Service Requesting Consult: MEDL  Service Providing Consult: Radiation Oncology  Consulting Attending: Hermine Loots, MD  Date: 03/06/24    Patient Name: Kayla Knight  MRN: [id number]   Primary Cancer: Unknown Primary, suspected lung  Reason(s) for Consult: Pain    Current Pain Regimen: APAP 1000 mg q8h, idocaine 1% patch, gabapentin 200 mg at bedtime, oxycodone 10/15 mg q4h PRN, hydromorphone 1mg  q4h PRN  Current Steroid Regimen: Dexamethasone 2 mg daily    Brief Assessment:   [name] is an 83 year old woman with T2DM, HTN, 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 PET/CT from 02/10/24 with RUL lung mass, right mediastinal and subcarinal lymph nodes, suspected malignant right pleural effusion. Our team is consulted for consideration of a palliative course of radiation for pain control.    Upon evaluation today, patient's pain distribution within the thoracolumbar spine with radiation to the left hip is consistent with spine imaging. Specifically, there is a compressive T9 lesion with epidural extension as well as multifocal lesions from L3-L5 and S1-S2 likely contributing to pain. At present, VIR is not recommending kyphoplasty and patient is eager to discharge home.    We will resume discussion on 6/10 to facilitate inpatient CT simulation prior to patient's discharge for radiation planning for a palliative course of RT to the most painful osseous disease in the spine.     Recommendations:     Radiation  VIR consulted, poor candidate for kyphoplasty to T9. Given this, will resume discussion re: radiation with goal of palliation for pain control prior to discharge. This can be done inpatient vs outpatient. If patient opts for this, would consider performing a CT simulation for RT planning before patient discharged.   Defer further discussion MW:[id number] therapy to med onc team given unclear primary.   Pathology:  02/29/24 right iliac biopsy consistent with metastatic carcinoma of unclear primary.  Pain: per primary team  Steroids: per primary team    [name], MD, [facility]  Radiation Oncology PGY-2     Please page the Inpatient/Urgent Radiation Oncology Consult Team Monday-Friday, 8a-4p with questions. For after hours or weekend emergencies, please page the on call resident.    ----  Pertinent history, including data review:    5/25: Admitted for progressive back/abdominal pain.  02/10/24: PET/CT with RUL lung mass, right mediastinal and subcarinal lymph nodes, likely malignant right pleural effusion, hypermetabolic subcentimeter node/soft tissues in right common iliac distribution and in left paraspinal region at level of L5 c/f metastatic disease.  02/16/24: CT Abdomen/Pelvis with multiple lesions throughout axial and appendicular skeleton c/f metastatic disease.  02/25/24: MRI brain with no intracranial metastases.  02/29/24: Biopsy of right iliac osseous lesion with final pathology confirming metastatic carcinoma of unclear origin.  03/04/24: MRI lumbar spine with multifocal osseous lesions in L3-L5, S1-S2. MRI thoracic spine with T9 compressive lesion with epidural extension. MRI cervical spine with additional bony metastases at C3, C7, and T1.    Today, Kayla Knight is accompanied by her daughter, Kayla Knight, who supplements the history. Patient has had persistent midline back pain with radiation to the left hip and left leg that has  become progressively worsening. Prior to admission, she was prescribed oxycodone 5 mg for a history of diverticulitis and was taking this as needed. Over the preceding few months, she has had a significant weight loss of 30+ lbs, progressive pain with ambulation (but no weakness) which prompted evaluation. She denies saddle anesthesia, bowel or bladder incontinence. 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 She is a former smoker ~20 years in total (quit in her 36s). She denies prior personal history of malignancy.    Kayla Knight lives locally in [place]. She is understandably hoping to discharge home but is amenable to discuss feasibility for kyphoplasty and will consider a palliative course of radiation for pain control pending this discussion with VIR.    Prior Radiation Therapy:  No.    Pacemaker:  No.    Pregnancy status:  Negative pregnancy test/infertile      PAST MEDICAL HISTORY:  Past Medical History[1]    FAMILY HISTORY:  Family History[2]    SOCIAL HISTORY:   Social History [3]    Allergies:  Allergies[4]    Pertinent Medications:  Current Medications[5]    Imaging:    PET/CT 02/10/24  Impression   -Centrally located right upper lobe lung mass, worrisome for primary lung cancer, possibly small cell, versus less likely metastatic disease.      -Hypermetabolic right mediastinal and subcarinal lymph nodes, worrisome for metastatic disease.      -Small right pleural effusion with focus of uptake, worrisome for malignant effusion.      -Hypermetabolic predominantly lytic lesions, worrisome for metastatic disease versus less likely multiple myeloma.      -Bilateral hypermetabolic adrenal gland thickening, worrisome for metastatic disease.      -Hypermetabolic subcentimeter node/soft tissues in the right common iliac distribution, anterior to the right iliac bone, and in the left paraspinal region at about the level of L5,  worrisome for metastatic disease.      -Hypermetabolic focus in the left gluteus maximus muscle, without clear correlating CT finding, worrisome for metastatic disease.     MRI Cervical spine w/wo contrast, 03/05/24:    Impression      Bony metastases at the levels of C3, C7, and T1 as above suspicious for epidural extension at C3 and T1 with suspected foraminal involvement on the right at C3. CT may be helpful for further evaluation.      Canal stenosis is moderate C3-C4, and there is moderate foraminal narrowing as above from C3-C6.     MRI thoracic spine w/wo contrast, 03/04/24:    Impression   Multifocal osseous metastatic disease involving the thoracic spine. There is a T9 vertebral body lesion with extension into the right subarticular zone and epidural extension with mass effect on the spinal cord as well as severe narrowing of the right T9-T10 neural foramen. There is mild associate increased signal within the spinal cord consistent with myelomalacia and/or edema.     MRI lumbar spine w/wo contrast, 03/04/24:    Impression   --Multifocal metastatic disease with enhancing lesions within the L3, L4, L5, S1, and S2 vertebral bodies.      --Severe multilevel degenerative change greatest in the lower lumbar spine with findings including mild spinal canal stenosis and moderate to severe severe bilateral neural foraminal narrowing at L3-L4 and L4-L5.       Physical Examination  ECOG: 3 = Capable of only limited selfcare, confined to bed or  chair more than 50% of waking hours  General: Awake, alert, conversant. Daughter at bedside.  Pertinent Physical Exam:  HEENT: Atraumatic, normocephalic. EOMI.  Cardiovascular: Warm and well perfused.  Pulmonary: Breathing comfortably on room air.  Extremities: Moving all extremities equally.  Neurologic: Alert and oriented. 5/5 strength to bilateral upper and lower extremities. In tact sensation. Gait and ambulation not assessed.    -I saw and evaluated/examined the patient, participating in the key portions of the service.  I discussed the findings, assessment, and plan of care with the resident/APP. I personally reviewed all relevant data associated with this encounter including imaging, labwork, and prior records. I have edited and agree with the findings and plan as documented in the resident/APP's note.     Antuane Eastridge L. Gabriel John, MD  Assistant Professor  Department of Radiation Oncology            [1]   Past Medical History:  Diagnosis Date    Anemia     past history, over 40 yrs ago    Cataract 2008    Surgery    Diabetes mellitus        Type II, on 9/15 patient stated she was not longer diabetic, not taking meds    Dry eye     Hearing impairment     Heart murmur 2016    [facility]. Jacquiline Maul @ [facility]    Heart valve disease     Heart mumur, pt reports diagnosis 10 yrs ago (05/10/20)    History of stent insertion of renal artery 01/29/2020    ASA indefinitely and plavix x 6 months per procedure note    Hypertension     pt reports taking meds since about 1995    Metastatic malignant neoplasm   02/22/2024    Neuromuscular disorder    2014    Feet & Legs    Tear of meniscus of knee     4.5 yrs ago    Urinary incontinence     seen a doctor about nighttime bathroom use (adressed in 2019)   [2]   Family History  Problem Relation Age of Onset    Heart disease Mother     Hypertension Mother     Dementia Mother     Arthritis Mother     Heart disease Father     Thyroid disease Father     Glaucoma Father     Alcohol abuse Father     Heart attack Father 76    Arthritis Father     Hypertension Father         Massive heart attack    Arthritis Sister     Glaucoma Sister     Hypertension Sister     Diabetes Sister     Diabetes Daughter     Diabetes Paternal Aunt     No Known Problems Brother     No Known Problems Maternal Aunt     No Known Problems Maternal Uncle     No Known Problems Paternal Uncle     No Known Problems Maternal Grandmother     No Known Problems Maternal Grandfather     No Known Problems Paternal Grandmother     No Known Problems Paternal Grandfather     Arthritis Sister         Noticeable in extremities    COPD Sister     Depression Sister Hypertension Sister     Glaucoma Sister     Asthma Daughter  Ocassional    Hypertension Daughter     Miscarriages / [place] Daughter     Cancer Maternal Aunt         Died more than 43yrs ago    Early death Maternal Aunt     Diabetes Paternal Aunt         4 Aunts    Kidney disease Paternal Aunt     Amblyopia Neg Hx     Blindness Neg Hx     Cancer Neg Hx     Cataracts Neg Hx     Macular degeneration Neg Hx     Retinal detachment Neg Hx     Strabismus Neg Hx     Stroke Neg Hx    [3]   Social History  Socioeconomic History    Marital status: Single   Tobacco Use    Smoking status: Former     Current packs/day: 0.00     Types: Cigarettes     Quit date: 05/19/1984     Years since quitting: 39.8     Passive exposure: Past    Smokeless tobacco: Never   Vaping Use    Vaping status: Never Used   Substance and Sexual Activity    Alcohol use: Yes     Comment: occassional    Drug use: Never    Sexual activity: Not Currently     Partners: Male   Social History Narrative    Exercise: water aerobics 3-4 x week for an hour        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        Dentist: [facility] 2018     Social Drivers of Health     Financial Resource Strain: Low Risk  (02/23/2024)    Overall Financial Resource Strain (CARDIA)     Difficulty of Paying Living Expenses: Not very hard   Recent Concern: Financial Resource Strain - Medium Risk (02/10/2024)    Overall Financial Resource Strain (CARDIA)     Difficulty of Paying Living Expenses: Somewhat hard   Food Insecurity: No Food Insecurity (02/23/2024)    Hunger Vital Sign     Worried About Running Out of Food in the Last Year: Never true     Ran Out of Food in the Last Year: Never true   Transportation Needs: No Transportation Needs (02/23/2024)    PRAPARE - Therapist, art (Medical): No     Lack of Transportation (Non-Medical): No   Physical Activity: Sufficiently Active (01/04/2023)    Exercise Vital Sign     Days of Exercise per Week: 7 days     Minutes of Exercise per Session: 30 min   Stress: No Stress Concern Present (01/04/2023)    [employer] of Occupational Health - Occupational Stress Questionnaire     Feeling of Stress : Only a little   Social Connections: Moderately Isolated (01/04/2023)    Social Connection and Isolation Panel     Frequency of Communication with Friends and Family: More than three times a week     Frequency of Social Gatherings with Friends and Family: More than three times a week     Attends Religious Services: Never     Database administrator or Organizations: Yes     Attends Engineer, structural: More than 4 times per year     Marital Status: Divorced   Housing: Low Risk  (02/23/2024)    Housing     Within the past  12 months, have you ever stayed: outside, in a car, in a tent, in an overnight shelter, or temporarily in someone else's home (i.e. couch-surfing)?: No     Are you worried about losing your housing?: No   [4]   Allergies  Allergen Reactions    Penicillins Itching and Swelling    Grass Pollen-June Grass Standard Other (See Comments)     Asthma attack     Lisinopril Cough    Statins-Hmg-Coa Reductase Inhibitors      Ineffective at controlling cholesterol when tried in the past.    Sulfa (Sulfonamide Antibiotics) Itching   [5]   Current Facility-Administered Medications   Medication Dose Route Frequency Provider Last Rate Last Admin    acetaminophen (TYLENOL) tablet 1,000 mg  1,000 mg Oral Q8H Arther Larve, MD   1,000 mg at 03/06/24 1401    albuterol (PROVENTIL HFA;VENTOLIN HFA) 90 mcg/actuation inhaler 2 puff  2 puff Inhalation Q6H PRN Arther Larve, MD        aluminum-magnesium hydroxide-simethicone (MAALOX MAX) 80-80-8 mg/mL oral suspension  30 mL Oral Q4H PRN Arther Larve, MD        amlodipine (NORVASC) tablet 10 mg  10 mg Oral Daily Arther Larve, MD   10 mg at 03/06/24 1610    aspirin chewable tablet 81 mg  81 mg Oral Daily Jabier Martens A, MD   81 mg at 03/06/24 0849    buprenorphine 10 mcg/hour transdermal patch 1 patch  1 patch Transdermal Q7 Days Jabier Martens A, MD   1 patch at 03/06/24 1401    clopidogrel (PLAVIX) tablet 75 mg  75 mg Oral Daily Jabier Martens A, MD   75 mg at 03/06/24 0849    [START ON 03/07/2024] dexAMETHasone (DECADRON) tablet 2 mg  2 mg Oral Daily Jabier Martens A, MD        dextrose 50 % in water (D50W) 50 % solution 12.5 g  12.5 g Intravenous Q15 Min PRN Arther Larve, MD        DULoxetine (CYMBALTA) DR capsule 40 mg  40 mg Oral Daily Hamburger, Emily K, MD   40 mg at 03/06/24 0849    enoxaparin (LOVENOX) syringe 40 mg  40 mg Subcutaneous Q24H Buckley, Ryanne, MD   40 mg at 03/05/24 2101    gabapentin (NEURONTIN) capsule 200 mg  200 mg Oral Nightly Jabier Martens A, MD        glucagon injection 1 mg  1 mg Intramuscular Once PRN Arther Larve, MD        glucose chewable tablet 16 g  16 g Oral Q10 Min PRN Arther Larve, MD        guaiFENesin (ROBITUSSIN) oral syrup  200 mg Oral Q4H PRN Arther Larve, MD        HYDROmorphone (PF) (DILAUDID) injection 1 mg  1 mg Intravenous Q4H PRN Buckley, Ryanne, MD   1 mg at 03/06/24 1208    lidocaine (ASPERCREME) 4 % 1 patch  1 patch Transdermal Daily Buckley, Ryanne, MD   1 patch at 03/06/24 0848    lidocaine (ASPERCREME) 4 % 1 patch  1 patch Transdermal Daily Buckley, Ryanne, MD   1 patch at 03/06/24 0848    lidocaine (ASPERCREME) 4 % 1 patch  1 patch Transdermal Daily Jabier Martens A, MD   1 patch at 03/06/24 0848    magnesium citrate solution 148 mL  148 mL Oral Daily PRN Jori Newer, MD        melatonin tablet 3  mg  3 mg Oral Nightly PRN Arther Larve, MD   3 mg at 03/02/24 2310    multivitamins, therapeutic with minerals tablet 1 tablet  1 tablet Oral Daily Buckley, Ryanne, MD   1 tablet at 03/06/24 0849    naloxegol (MOVANTIK) tablet 25 mg  25 mg Oral Daily Jori Newer, MD   25 mg at 03/06/24 0849    naloxone (NARCAN) injection 0.1 mg  0.1 mg Intravenous Q5 Min PRN Arther Larve, MD        ondansetron (ZOFRAN-ODT) disintegrating tablet 4 mg  4 mg Oral Q8H PRN Jabier Martens A, MD        Or    ondansetron (ZOFRAN) injection 4 mg  4 mg Intravenous Q8H PRN Jabier Martens A, MD   4 mg at 03/05/24 1301    oxyCODONE (ROXICODONE) immediate release tablet 10 mg  10 mg Oral Q4H PRN Buckley, Ryanne, MD   10 mg at 03/02/24 1809    Or    oxyCODONE (ROXICODONE) immediate release tablet 15 mg  15 mg Oral Q4H PRN Buckley, Ryanne, MD   15 mg at 03/06/24 0850    polyethylene glycol (MIRALAX) packet 17 g  17 g Oral BID Buckley, Ryanne, MD   17 g at 03/05/24 1351    prochlorperazine (COMPAZINE) injection 2.5 mg  2.5 mg Intravenous Q8H PRN Arther Larve, MD   2.5 mg at 03/03/24 0981    Or    prochlorperazine (COMPAZINE) injection 5 mg  5 mg Intravenous Q8H PRN Arther Larve, MD        senna Alonna Art) tablet 2 tablet  2 tablet Oral BID Jabier Martens A, MD   2 tablet at 03/05/24 2057    SMOG ENEMA  240 mL Rectal Once Jabier Martens A, MD        spironolactone (ALDACTONE) tablet 25 mg  25 mg Oral Daily Arther Larve, MD   25 mg at 03/06/24 [phone]

## 2024-03-06 NOTE — Unmapped (Signed)
 Shift Summary  Pain management was effective with a decrease in pain levels from 8 to 4 after medication administration.    Nutritional intake remained inadequate with a noted loss of appetite.    Fall prevention measures were consistently applied, ensuring safety.    No nausea or vomiting relief interventions were documented.    Overall, pain control improved, but nutritional intake needs further attention.   SMOG enema administered.  Palliative consulted and spoke to patient and patients daughter at the bedside.  Plan home with hospice.    Improved Nutritional Intake: Nutrition was probably inadequate throughout the shift, and there was a loss of appetite noted. No significant changes were observed in nutritional intake.     Absence of Fall and Fall-Related Injury: Hourly visual checks and toilet assistance every two hours were consistently performed, and safety devices were used during transfers when applicable, preventing any falls or injuries.     Optimal Pain Control and Function: Pain in the abdomen decreased from a level of 8 to 4 on the pain scale after administration of oxyCODONE and HYDROmorphone (PF). Buprenorphine was administered later, maintaining the pain level at 4.

## 2024-03-06 NOTE — Unmapped (Signed)
 ADVANCE CARE PLANNING NOTE    Discussion Date:  March 06, 2024    Patient has decisional capacity:  Yes    Patient has selected a Health Care Decision-Maker if loses capacity: Yes    Health Care Decision Maker as of 03/06/2024    HCDM [name]): Kayla Knight - Daughter - [phone]    Discussion Participants:  Kayla Knight- Patient  Donnell Gails- Daughter (via phone)  Jabier Martens, MD- Intern    Communication of Medical Status/Prognosis:   Patient with metastatic cancer of unknown primary with right upper lobe mass with mets to the adrenal glands, iliac, gluteus maximus and multiple lytic lesions along the spine.  She was being worked up outpatient at the undifferentiated cancer clinic, was advised to present to ED from oncology appointment secondary to uncontrolled pain and further cancer workup.  Underwent VIR guided bone biopsy on 6/3.  Pain likely multifactorial in the setting of constipation, lytic bone lesions, neuropathic pain.  She was treated with aggressive bowel regimen and palliative care was consulted for pain management recs.  Pain continued to be main source of concern mainly in her midline back with radiation down her left hip and leg, limiting overall mobility.  She underwent further imaging with CT and MRI of her lumbar thoracic and cervical spine which showed several lytic bone lesions and associated pathological fractures including T9 vertebral body lesion with mass effect onto the spinal cord and associated fracture of the spine which did not correspond with area of pain.  Radiation oncology consulted for potential palliative radiation.  Per VIR kyphoplasty is not recommended in T9 region due to overall instability and high risk of procedure.  Preliminary biopsy results on 6/9 with findings for metastatic carcinoma of unknown origin.  Discussed potential EBUS with biopsy of mediastinal nodes for further diagnosis, acknowledging that it is a more invasive procedure compared to bone biopsy. Upon discussion with patient and daughter at bedside, patient's main priority was controlling her pain and getting home.  She does not wish to pursue further biopsies at this time and would not want further treatment of cancer including chemotherapy.  She is willing to undergo a trial of palliative radiation to see if that will help with her pain.  Patient expressed desire to go home with hospice.      Communication of Treatment Goals/Options:   Discussed options including pursuing EBUS for diagnostic clarification and consideration of further cancer directed treatments versus going home with hospice and focusing on comfort, pain control, and spending time with family.    Treatment Decisions:   She does not wish to pursue further biopsy at this time and would like to go home to hospice with focus on controlling her pain and spending time with her family and loved ones.  She is still interested in potentially receiving palliative radiation for her lesions, will defer this further conversation with radiation oncology.  This can be done outpatient.  DME, including hospital bed, has already been delivered to her daughter's house.  Notified case management of decision.  Will touch base with them tomorrow 6/10 to facilitate hospice transfer.      I spent 45 minutes providing voluntary advance care planning services for this patient.

## 2024-03-07 ENCOUNTER — Inpatient Hospital Stay
Admit: 2024-03-07 | Discharge: 2024-03-08 | Payer: Medicare (Managed Care) | Attending: Radiation Oncology | Primary: Radiation Oncology

## 2024-03-07 ENCOUNTER — Inpatient Hospital Stay
Admit: 2024-03-07 | Discharge: 2024-03-08 | Attending: Student in an Organized Health Care Education/Training Program | Primary: Student in an Organized Health Care Education/Training Program

## 2024-03-07 ENCOUNTER — Inpatient Hospital Stay: Admit: 2024-03-07 | Discharge: 2024-03-08 | Attending: Radiation Oncology | Primary: Radiation Oncology

## 2024-03-07 MED ORDER — OXYCODONE 10 MG TABLET
ORAL_TABLET | ORAL | 0 refills | 7.00000 days | Status: CP | PRN
Start: 2024-03-07 — End: ?
  Filled 2024-03-07: qty 60, 7d supply, fill #0

## 2024-03-07 MED ORDER — MAGNESIUM HYDROXIDE 400 MG/5 ML ORAL SUSPENSION
Freq: Three times a day (TID) | ORAL | 0 refills | 5.00000 days | Status: CP | PRN
Start: 2024-03-07 — End: ?
  Filled 2024-03-07: qty 473, 5d supply, fill #0

## 2024-03-07 MED ORDER — BISACODYL 10 MG RECTAL SUPPOSITORY
Freq: Every day | RECTAL | 0 refills | 5.00000 days | Status: CP | PRN
Start: 2024-03-07 — End: 2024-03-12
  Filled 2024-03-07: qty 5, 5d supply, fill #0

## 2024-03-07 MED ORDER — MULTIVITAMIN-IRON 9 MG-FOLIC ACID 400 MCG-CALCIUM AND MINERALS TABLET
ORAL_TABLET | Freq: Every day | ORAL | 0 refills | 30.00000 days | Status: CN
Start: 2024-03-07 — End: ?

## 2024-03-07 MED ORDER — PROCHLORPERAZINE MALEATE 5 MG TABLET
ORAL_TABLET | Freq: Three times a day (TID) | ORAL | 0 refills | 5.00000 days | Status: CP | PRN
Start: 2024-03-07 — End: 2024-03-12
  Filled 2024-03-07: qty 30, 5d supply, fill #0

## 2024-03-07 MED ORDER — LIDOCAINE 5 % TOPICAL PATCH
MEDICATED_PATCH | TRANSDERMAL | 0 refills | 5.00000 days | Status: CP
Start: 2024-03-07 — End: 2024-03-07
  Filled 2024-03-07: qty 20, 5d supply, fill #0

## 2024-03-07 MED ORDER — GABAPENTIN 100 MG CAPSULE
ORAL_CAPSULE | Freq: Every evening | ORAL | 0 refills | 5.00000 days | Status: CP
Start: 2024-03-07 — End: ?
  Filled 2024-03-07: qty 10, 5d supply, fill #0

## 2024-03-07 MED ORDER — SENNOSIDES 8.6 MG TABLET
ORAL_TABLET | Freq: Three times a day (TID) | ORAL | 0 refills | 5.00000 days | Status: CP
Start: 2024-03-07 — End: 2024-03-12
  Filled 2024-03-07: qty 30, 5d supply, fill #0

## 2024-03-07 MED ORDER — ONDANSETRON 4 MG DISINTEGRATING TABLET
ORAL_TABLET | Freq: Four times a day (QID) | 0 refills | 5.00000 days | Status: CP | PRN
Start: 2024-03-07 — End: 2024-03-12
  Filled 2024-03-07: qty 40, 5d supply, fill #0

## 2024-03-07 MED ADMIN — acetaminophen (TYLENOL) tablet 1,000 mg: 1000 mg | ORAL | @ 01:00:00

## 2024-03-07 MED ADMIN — acetaminophen (TYLENOL) tablet 1,000 mg: 1000 mg | ORAL | @ 10:00:00 | Stop: 2024-03-07

## 2024-03-07 MED ADMIN — dexAMETHasone (DECADRON) tablet 2 mg: 2 mg | ORAL | @ 14:00:00 | Stop: 2024-03-07

## 2024-03-07 MED ADMIN — lidocaine (ASPERCREME) 4 % 1 patch: 1 | TRANSDERMAL | @ 14:00:00 | Stop: 2024-03-07

## 2024-03-07 MED ADMIN — spironolactone (ALDACTONE) tablet 25 mg: 25 mg | ORAL | @ 14:00:00 | Stop: 2024-03-07

## 2024-03-07 MED ADMIN — acetaminophen (TYLENOL) tablet 1,000 mg: 1000 mg | ORAL | @ 17:00:00 | Stop: 2024-03-07

## 2024-03-07 MED ADMIN — HYDROmorphone (PF) (DILAUDID) injection 1 mg: 1 mg | INTRAVENOUS | @ 14:00:00 | Stop: 2024-03-07

## 2024-03-07 MED ADMIN — oxyCODONE (ROXICODONE) immediate release tablet 10 mg: 10 mg | ORAL | @ 22:00:00 | Stop: 2024-03-07

## 2024-03-07 MED ADMIN — DULoxetine (CYMBALTA) DR capsule 40 mg: 40 mg | ORAL | @ 14:00:00 | Stop: 2024-03-07

## 2024-03-07 MED ADMIN — amlodipine (NORVASC) tablet 10 mg: 10 mg | ORAL | @ 14:00:00 | Stop: 2024-03-07

## 2024-03-07 MED ADMIN — aspirin chewable tablet 81 mg: 81 mg | ORAL | @ 14:00:00 | Stop: 2024-03-07

## 2024-03-07 MED ADMIN — senna (SENOKOT) tablet 2 tablet: 2 | ORAL | @ 01:00:00

## 2024-03-07 MED ADMIN — gabapentin (NEURONTIN) capsule 200 mg: 200 mg | ORAL | @ 01:00:00

## 2024-03-07 MED ADMIN — naloxegol (MOVANTIK) tablet 25 mg: 25 mg | ORAL | @ 14:00:00 | Stop: 2024-03-07

## 2024-03-07 MED ADMIN — oxyCODONE (ROXICODONE) immediate release tablet 15 mg: 15 mg | ORAL | @ 07:00:00 | Stop: 2024-03-07

## 2024-03-07 MED ADMIN — HYDROmorphone (PF) (DILAUDID) injection 1 mg: 1 mg | INTRAVENOUS | @ 19:00:00 | Stop: 2024-03-07

## 2024-03-07 MED ADMIN — enoxaparin (LOVENOX) syringe 40 mg: 40 mg | SUBCUTANEOUS | @ 01:00:00

## 2024-03-07 MED ADMIN — ondansetron (ZOFRAN-ODT) disintegrating tablet 4 mg: 4 mg | ORAL | @ 19:00:00 | Stop: 2024-03-07

## 2024-03-07 MED ADMIN — oxyCODONE (ROXICODONE) immediate release tablet 15 mg: 15 mg | ORAL | @ 01:00:00 | Stop: 2024-03-08

## 2024-03-07 MED ADMIN — multivitamins, therapeutic with minerals tablet 1 tablet: 1 | ORAL | @ 14:00:00 | Stop: 2024-03-07

## 2024-03-07 NOTE — Unmapped (Signed)
 Shift Summary  Pain remained constant at level 8 despite medication administration.    Patient refused polyethylene glycol, indicating potential issues with gastrointestinal management.    Patient initially refused activity but later ambulated to the bathroom with assistance, showing some improvement in mobility.    No falls or injuries occurred during the shift, maintaining a safe environment.    Overall, pain management was challenging, and nutritional intake remained inadequate.     Improved Nutritional Intake: Nutritional intake remains probably inadequate, with no significant changes noted during the shift.     Optimal Comfort and Wellbeing: Despite administration of oxyCODONE and gabapentin, abdominal pain remained constant at a level 8 throughout the shift, with descriptors changing from cramping to aching and cramping.     Absence of Fall and Fall-Related Injury: Patient was ambulated to the bathroom with standby assistance, and no falls or injuries were reported during the shift.

## 2024-03-07 NOTE — Unmapped (Shared)
 Palliative Care Progress Note      Consultation from Requesting Attending Physician:  Kayla Baumgarten, MD  Primary Care Provider:  Powell-Tillman, Levonne Genese, MD      Assessment/Plan:      SUMMARY:  This 83 y.o. 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 Palliative care is following for symptom management and support.     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 Constipation likely also contributing.  - Continue multimodal pain control with scheduled tylenol and lidocaine patches; she felt like pain was better controlled with four lidocaine patches, so ok to give her 4  - Continue buprenorphine patch to 10 mcg/hr TD q7 days   - Continue oxycodone 10-15 mg q4H PRN  - Continue IV dilaudid 1 mg q4H PRN for pain refractory to oral meds   - Continue duloxetine to 40 mg daily for nerve related pain  - Continue gabapentin to 200 mg PO nightly   - Continue dexamethasone 2 mg PO daily for inflammatory bone lesions  - Please ensure she has a referral to outpatient oncology palliative clinic upon discharge. Discussed that we do not have an appointment at [facility] until mid July, so primary team is reaching out to her PCP to see if they can prescribe her opioids until she sees us  in clinic. We gave them [facility] Lease phone number and advised them to reach out to us  if issues come up in the interim regarding uncontrolled pain, or they need to reach us  for other reasons. Family is still worried about ability to care for Kayla Knight at home, previous discussion of SNF vs acute rehab vs home with PT services.     Opioid induced constipation   She is having small volume liquid stools, feeling nauseous, has hx of chronic constipation and is now on opioids. Had colonoscopy 5/16 that showed one 6 mm polyp in the cecum, diverticulosis, internal hemorrhoids. Discussed that she likely has constipation with overflow. KUB confirmed air in the colon and moderate stool burden per my review.  - Continue miralax 17g BID (often refusing)   - Continue senna to three tabs BID   - Would add milk of magnesia 30 mL TID until she has a good BM  - Primary team ordered naloxegol 25 mg daily      Nausea  Intermittent, likely related to constipation. Would treat constipation as above. Prefer to use compazine as first line since it is less constipating than zofran. Discussed that buprenorphine patch can cause nausea (but it started pre-patch).    Goals of care / ACP:  Code Status:   Code Status: DNR and DNI   Healthcare decision-maker if lacks capacity:   HCDM (HCPOA): Kayla Knight, Kayla Knight - Daughter - [phone]       Practical, Emotional, Spiritual Support Recommendations:  - Empathetic listening and support provided during today's encounter.   - Her goal is to  return home to live with her daughter with home hospice for symptom management.      Recommendations shared with primary team via Epic chat      Thank you for this consult. Please contact Palliative Care if there are any questions.       Subjective:     Recent Events:      [name] was seen this afternoon with her daughter, Kayla Knight, at bedside. She reports that she's having a good day with good pain control. She had recently returned from a scan and was awaiting her first round of palliative radiation to take place this afternoon. Clarified that the goal is to discharge today with home hospice and that hospice will take over medication management.     Objective:       Function:  70% - Ambulation: Reduced / unable to do normal work, some evidence of disease / Self-Care: Full / Intake: Normal or reduced / Level of Conscious: Full    Temp:  [36.8 ??C (98.2 ??F)-36.9 ??C (98.4 ??F)] 36.9 ??C (98.4 ??F)  Pulse:  [79-95] 95  Resp:  [18] 18  BP: (148-161)/(65-87) 158/86  SpO2:  [94 %-97 %] 97 %    Physical Exam:  Constitutional: Comfortable in bed, attendant to interview  Eyes: anicteric sclera, no discharge  ENMT: Dry oral mucosa  Pulm: Breathing comfortably on RA   Psych: mood and affect appropriate    Testing reviewed and interpreted:   Reviewed and interpreted test results for anemia, thrombocytosis, hyponatremia affecting assessment of underlying illness severity and prognosis    I personally spent 30 minutes face-to-face and non-face-to-face in the care of this patient, which includes all pre, intra, and post visit time on the date of service.  All documented time was specific to the E/M visit and does not include any procedures that may have been performed.     See ACP Note from today for additional billable service:  No.      Kayla Knight, MS4    I personally spent 25 minutes face-to-face and non-face-to-face in the care of this patient, which includes all pre, intra, and post visit time on the date of service.  All documented time was specific to the E/M visit and does not include any procedures that may have been performed. I attest that I have reviewed the student note and that the components of the history of the present illness, the physical exam, and the assessment and plan documented were performed by me or were performed in my presence by the student where I verified the documentation and performed (or re-performed) the exam and medical decision making.     Kayla Cotta, MD

## 2024-03-07 NOTE — Unmapped (Signed)
 We reviewed risks/benefits of radiation with Kayla Knight and her daughter today in our department. We reviewed that this is a palliative course of radiation with the goal of improving pain control, but is not a curative-intent treatment. We also discussed that our single fraction course can be associated with higher rates of re-treatment; however, she is being discharged to hospice and thus a single fraction course expediting this process is in line with goals of care. Risks discussed including but not limited to in the short term fatigue, nausea, acute pain flair. All questions answered and patient is amenable to proceed.     We will plan to treat T8-T10 and L2-S3 in a single fraction, 8 Gy x 1, prior to discharge with hospice.     Discussed that she may need anti emetics if she develops nausea following this treatment, and that this will be a temporary side effect, if it occurs.    Informed consent obtained. She will undergo CT simulation for radiation planning this morning and will plan to do a single radiation treatment at 4:30 pm today while she remains inpatient.    Lynnwood Beckford, MD, MBA  Radiation Oncology PGY-2     -I saw and evaluated/examined the patient, participating in the key portions of the service.  I discussed the findings, assessment, and plan of care with the resident/APP. I personally reviewed all relevant data associated with this encounter including imaging, labwork, and prior records. I have edited and agree with the findings and plan as documented in the resident/APP's note.     Dana L. Gabriel John, MD  Assistant Professor  Department of Radiation Oncology

## 2024-03-07 NOTE — Unmapped (Signed)
 Shift Summary  Pain management was effective, with a reduction in pain level and frequency after medication administration.    The patient was off the unit for radiation treatment twice during the shift.    Polyethylene glycol and senna were refused in [facility] [place].    Safety measures were maintained throughout the shift, preventing any falls or injuries.    Overall, the patient showed improvement in pain control and maintained safety without any incidents.     Absence of Fall and Fall-Related Injury: No falls or fall-related injuries occurred during the shift, and all safety interventions, including alarms and visual checks, were consistently maintained.     Optimal Pain Control and Function: Pain in the abdomen decreased from a 5 to a 3 on the pain scale after administration of HYDROmorphone (PF) and ondansetron, with pain frequency changing from constant to intermittent.

## 2024-03-07 NOTE — Unmapped (Signed)
 Physician Discharge Summary [facility]  [address] [facility]  [address]  [place] [address]  Dept: [phone]  Loc: [phone]     Identifying Information:   Kayla Knight  1941/07/26  [id number]    Primary Care Physician: Powell-Tillman, Levonne Genese, MD     Code Status: DNR and DNI    Admit Date: 02/22/2024    Discharge Date: 03/07/2024     Discharge To: Home Hospice    Discharge Service: [facility] - General Medicine Floor Team MED L - Tower     Discharge Attending Physician: No att. providers found    Discharge Diagnoses:   Principal Problem (Resolved):    Uncontrolled pain (POA: Unknown)  Active Problems:    Benign essential hypertension (POA: Yes)    Type 2 diabetes mellitus, without long-term current use of insulin    (POA: Yes)    Renal artery stenosis, native, bilateral (CMS-HCC) (POA: Yes)    Hyperlipidemia, unspecified (POA: Yes)    Nonrheumatic aortic valve insufficiency (POA: Yes)    S/P cataract extraction and insertion of intraocular lens, left (POA: Not Applicable)    Malignant neoplasm of upper lobe of right lung    (POA: Yes)    Drug-induced constipation (POA: Yes)    Outpatient Provider Follow Up Issues:   [ ]  Ensure successful transition to home hospice    Hospital Course:   83 yo F w/ PMHx of T2DM, Resistant HTN, Hix of renal artery stenosis s/p sten L renal artery (2021) and R renal artery (2023), HLD, hx diverticulitis, persistent lower abd pain. Had PET 02/10/2024 with new diagnosed RUL mass with associated mediastinal and subcarinal lymphadenopathy as well as lytic lesion and R pleural effusion concerning for metastatic disease, so presented for uncontrolled pain, nausea and vomiting in need of expedited cancer workup. Abdominal pain well controlled with aggressive bowel regimen. Underwent VIR guided superficial bone biopsy on 6/3 which showed metastatic disease of unknown origin. Patient and family ultimately opted to go home with hospice to maximize quality time with family.    Active Problems     Uncontrolled Abdominal Pain - Pain Related to Likely Metastatic Disease  Patient presented with uncontrolled pain in the setting of recently discovered RUL lung mass with diffuse bony lesions, concerning for metastatic disease. On arrival, much of her pain seemed to be abdominal pain, favored to be related to opioid-induced constipation as discussed below. After optimization of her bowel regimen, her pain initially improved but progressed severe episodes of pain starting in her mid-lower back with radiation down her left hip and leg. She underwent MRI of thoracic and lumbar spine which was notable for T9 metastatic lesion with compression of spinal cord on the R side. Orthopedic surgery was consulted re: spinal mets and she underwent further imaging including CT Thoracic/Lumbar spine and MRI cervical spine which showed bony metastasis at C3, C7, T1, T9 and numerous lytic lesions. Patient received session of radiation to T8-T10 and L2-S3 in a single fraction, 8 Gy x 1 on 6/10. Palliative care assisted with pain management, regimen titrated and discharged on regimen as below and will be managed by hospice on discharge.  Pain:   - Buprenorphine patch 10 mcg/hr TD q7 days (last placed 03/05/24)  - Tylenol 1000mg  q8h  - Oxycodone 10-15 mg q4h PRN  - Duloxetine 40 mg daily for nerve related pain  - Gabapentin 200 mg PO nightly   - Dexamethasone 2 mg PO daily for bony pain  Nausea:   -  Compazine 5-10mg  q8h PRN  - Zofran 4-8mg  q6h PRN 2nd line given risk of constipation    Metastatic Carcinoma of Unknown Origin  Seen at undifferentiated cancer clinic prior to admission. During admission, VIR, medical oncology, and interventional pulmonology were consulted and options for tissue sampling were discussed. Ultimately, the decision was made to pursue biopsy of vertebral lesion with VIR. Per oncology, MRI of the brain was also obtained during admission for staging purposes which did not show evidence of brain metastasis. She underwent VIR superficial bone biopsy on 6/3 and tolerated well. Pathology positive for metastatic carcinoma of unknown origin; possibly lung carcinoma given initial RUL lung mass. Declined EBUS biopsy with pulmonology. After extensive GOC conversation with family and patient, they decided to go home with hospice to maximize quality time with family.     Opioid-induced constipation - Ileus  Patient with worsening constipation and abdominal pain in weeks prior to admission. At home, she was getting Miralax BID and magnesium citrate BID per daughter, with more regular BM and improved pain. CT A/P was obtained and did not reveal any acute intra-abdominal pathology. Repeat KUB with mildly distended bowels concerning for ileus. Started on Naloxegol in addition to miralax, senna, magnesium citrate and suppositories. Regimen on discharge included: senna 2 tablets TID (or 3 tabs BID, whichever is preferred), miralax BID PRN, magnesium citrate 30mL TID PRN and dulcolax suppository daily PRN with goal of 1-2 BM a day.     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 Patient without neuro deficits. Risk factors include HTN, pre-diabetes, small vessel disease and likely malignancy. Was previously prescribed Repatha for HLD, per daughter patient has not been taking medications for several months. Patient unsure when she last had Repatha injection (last dispensed 12/03/2023). TTE -ve, carotid dopplers without significant stenosis. Per neurology okay for monotherapy (ASA or Plavix). Given discharging to hospice, stopped repatha and continued ASA.    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 BP on admission was elevated to 170s systolic, but this improved with improvement of her pain. Discharged on amlodipine and spironolactone on discharge for comfort.     Asymptomatic bacteruria  Prior to admission, patient was found to have a urine cultures growing S. Agalactiae. She states that she was prescribed amoxicillin but orders did not go through so was never treated. Denied any UTI symptoms including no dysuria, urinary frequency, hematuria. No fever or chills. Repeat UA with only moderate leuk esterase and 14 WBC with urine culture showing mixed urogenital flora, so no evidence of active infection or indication to treat.     Chronic Problems  T2DM  Well controlled without medications. Most recent A1c 5.7 on 10/22. Blood sugars controlled during admission.     HLD  Stopped home evolocumab.     Diverticulitis  Recent colonoscopy on 02/11/2024 showed 6 mm polyp, diverticulosis without malignancy, active bleeding. CT A/P inpatient without evidence of active diverticulitis.     Asthma   Albuterol Q6h PRN was available.    Nutrition Assessment:   Severe Protein-Calorie Malnutrition in the context of chronic illness (02/23/24 1420)  Energy Intake: < or equal to 75% of estimated energy requirement for > or equal to 1 month  Interpretation of Wt. Loss: > 10% x 6 month  Fat Loss: Moderate  Muscle Loss: Severe  Malnutrition Score: 3    Touchbase with Outpatient Provider:  Warm Handoff: Completed on 03/07/24 by Darrelyn Ely, MD  (Intern) via [facility]  ______________________________________________________________________  Discharge Medications:      Your Medication List        STOP taking these medications      clopidogrel 75 mg tablet  Commonly known as: PLAVIX     lidocaine 5 % patch  Commonly known as: LIDODERM     MAGNESIUM CITRATE ORAL     REPATHA SURECLICK 140 mg/mL Pnij  Generic drug: evolocumab            START taking these medications      ASPERFLEX (LIDOCAINE) 4 % patch  Generic drug: lidocaine  Place 1-4 patches on the skin daily. Apply patches for 12 hours and have a patch free period of 12 hours.  Start taking on: March 08, 2024     aspirin 81 MG chewable tablet  Chew 1 tablet (81 mg total) daily for 5 days.  Start taking on: March 08, 2024     bisacodyl 10 mg suppository  Commonly known as: DULCOLAX  Insert 1 suppository (10 mg total) into the rectum daily as needed for up to 5 days.     buprenorphine 10 mcg/hour Ptwk transdermal patch  Place 1 patch on the skin every seven (7) days. Current patch should be replaced 03/13/2024.  Start taking on: March 13, 2024     dexAMETHasone 2 MG tablet  Commonly known as: DECADRON  Take 1 tablet (2 mg total) by mouth in the morning.  Start taking on: March 08, 2024     DULoxetine 20 MG capsule  Commonly known as: CYMBALTA  Take 2 capsules (40 mg total) by mouth daily.  Start taking on: March 08, 2024     gabapentin 100 MG capsule  Commonly known as: NEURONTIN  Take 2 capsules (200 mg total) by mouth nightly.     magnesium hydroxide 400 mg/5 mL suspension  Generic drug: magnesium hydroxide  Take 30 mL by mouth Three (3) times a day as needed. Use if patient not responding to miralax, senna, dulcolax suppository     prochlorperazine 5 MG tablet  Commonly known as: COMPAZINE  Take 1-2 tablets (5-10 mg total) by mouth every eight (8) hours as needed for nausea for up to 5 days. Use first line for nausea if zofran/ondansetron is not working.     SENNA 8.6 mg tablet  Generic drug: senna  Take 2 tablets by mouth three (3) times a day for 5 days.            CHANGE how you take these medications      acetaminophen 500 MG tablet  Commonly known as: TYLENOL  Take 2 tablets (1,000 mg total) by mouth every eight (8) hours.  What changed: how much to take     ondansetron 4 MG disintegrating tablet  Commonly known as: ZOFRAN-ODT  Dissolve 1-2 tablets (4-8 mg total) in the mouth every six (6) hours as needed for nausea for up to 5 days. Take 30 minutes prior to radiation sessions. Otherwise, use as second line for nausea after compazine/prochlorperazine.  What changed:   how much to take  when to take this  additional instructions     oxyCODONE 10 mg immediate release tablet  Commonly known as: ROXICODONE  Take 1-1.5 tablets (10-15 mg total) by mouth every four (4) hours as needed.  What changed:   medication strength  how much to take  when to take this  reasons to take this            CONTINUE taking these medications      albuterol 90 mcg/actuation inhaler  Commonly known as: PROVENTIL HFA;VENTOLIN HFA  Inhale 2 puffs every six (6) hours as needed.     amlodipine 10 MG tablet  Commonly known as: NORVASC  Take 1 tablet (10 mg total) by mouth daily for 5 days.  Start taking on: March 08, 2024     empty container Misc  Commonly known as: SHARPS-A-GATOR DISPOSAL SYSTEM  Use as directed for sharps disposal     magnesium oxide 400 mg (241.3 mg elemental) tablet  Commonly known as: MAG-OX  Take 1 tablet (400 mg total) by mouth daily.     MIEBO (PF) 100 % Drop  Generic drug: perfluorohexyloctane (PF)  Administer to both eyes two (2) times a day.     polyethylene glycol 17 gram packet  Commonly known as: MIRALAX  Take 17 g by mouth two (2) times a day.     spironolactone 50 MG tablet  Commonly known as: ALDACTONE  Take 1 tablet (50 mg total) by mouth daily. Taking 1/2 tablet ?            Allergies:  Penicillins, Grass pollen-june grass standard, Lisinopril, Statins-hmg-coa reductase inhibitors, and Sulfa (sulfonamide antibiotics)  ______________________________________________________________________  Pending Test Results:    Most Recent Labs:    CBC - Results in Past 30 Days  Result Component Current Result Ref Range Previous Result Ref Range   HCT 30.3 (L) (03/06/2024) 34.0 - 44.0 % 29.6 (L) (03/05/2024) 34.0 - 44.0 %   HGB 10.2 (L) (03/06/2024) 11.3 - 14.9 g/dL 9.9 (L) (0/05/8118) 14.7 - 14.9 g/dL   MCH 82.9 (L) (01/31/2129) 25.9 - 32.4 pg 23.1 (L) (03/05/2024) 25.9 - 32.4 pg   MCHC 33.6 (03/06/2024) 32.0 - 36.0 g/dL 86.5 (04/05/4695) 29.5 - 36.0 g/dL   MCV 28.4 (L) (09/30/2438) 77.6 - 95.7 fL 69.2 (L) (03/05/2024) 77.6 - 95.7 fL   MPV 6.4 (L) (03/06/2024) 6.8 - 10.7 fL 6.4 (L) (03/05/2024) 6.8 - 10.7 fL   Platelet 798 (H) (03/06/2024) 150 - 450 10*9/L 798 (H) (03/05/2024) 150 - 450 10*9/L   RBC 4.39 (03/06/2024) 3.95 - 5.13 10*12/L 4.28 (03/05/2024) 3.95 - 5.13 10*12/L   WBC 9.7 (03/06/2024) 3.6 - 11.2 10*9/L 9.7 (03/05/2024) 3.6 - 11.2 10*9/L     BMP - Results in Past 30 Days  Result Component Current Result Ref Range Previous Result Ref Range   BUN 12 (03/06/2024) 9 - 23 mg/dL 13 (1/0/2725) 9 - 23 mg/dL   Chloride 96 (L) (12/02/6438) 98 - 107 mmol/L 96 (L) (03/05/2024) 98 - 107 mmol/L   CO2 27.0 (03/06/2024) 20.0 - 31.0 mmol/L 28.0 (03/05/2024) 20.0 - 31.0 mmol/L   Creatinine 0.74 (03/06/2024) 0.55 - 1.02 mg/dL 3.47 (12/29/5954) 3.87 - 1.02 mg/dL   Glucose 564 (11/28/2949) 70 - 179 mg/dL 99 (05/05/4165) 70 - 063 mg/dL   Potassium 4.4 (0/09/6008) 3.4 - 4.8 mmol/L 4.5 (03/05/2024) 3.4 - 4.8 mmol/L   Sodium 134 (L) (03/06/2024) 135 - 145 mmol/L 134 (L) (03/05/2024) 135 - 145 mmol/L     Coagulation - Results in Past 30 Days  Result Component Current Result Ref Range Previous Result Ref Range   APTT 28.3 (02/22/2024) 24.8 - 38.4 sec Not in Time Range    INR 1.03 (02/22/2024)  Not in  Time Range    PT 11.7 (02/22/2024) 9.9 - 12.6 sec Not in Time Range        Relevant Studies/Radiology:  No results found.  ______________________________________________________________________  Discharge Instructions:   Activity Instructions       Activity as tolerated                  Resources and Referrals       Commode      Length of Need: 99 months    Type of commode: Chair    Bedside commode        Bedside commode    Home Medical Equipment      Length of Need: 99 months    Specify: bed rails    Home Medical Equipment      Length of Need: 99 months    Specify: 99 months    Hospital Bed (DME)      Type: Half Rails    Length of Need: 99 months    Transfer Bench (DME)      Type: Standard    Length of Need: 99 months    Walker      Type: Standard    Wheels: 4 Manufacturing systems engineer of Need: 99 months      Family Caregiver Support Program  [place], [place], [place], [place], [place], [place] Jennifer Link jlink@[place] .gov [phone]            Follow Up instructions and Outpatient Referrals     Ambulatory Referral to Oncology      Reason for referral: undiagnosed metastatic cancer, hospital f/u s/p bone   biopsy    Requested follow up plan: You would evaluate and manage.    Ambulatory Referral to Palliative Care      Reason for referral: cancer pain, RUL mass with metastatic disease    Is this a palliative referral for:  symptom management Comment - pain  goals of care  coping support       Requested follow up plan: You would evaluate and manage.    [facility] [place] (Non-Cancer Diagnosis)    [facility] [facility] [place] (Cancer Diagnosis)    [facility] [place] (Cancer Diagnosis - [place])      [place] [facility] (Home-Based)    [facility] [place] (Pediatrics)    *For [facility] Palliative Care Programs, contact the organization directly   for external referral process    Ambulatory Referral to Hospice      Reason for referral: hospice at home    Facility Type: Home-based    Call MD for:  difficulty breathing, headache or visual disturbances      Call MD for:  persistent nausea or vomiting      Call MD for:  severe uncontrolled pain      Call MD for:  temperature >38.5 Celsius      Discharge instructions          Appointments which have been scheduled for you      Mar 20, 2024 1:00 PM  (Arrive by 12:30 PM)  NEW PALLIATIVE CARE with Ahmed Ales, FNP  [facility] HEMATOLOGY ONCOLOGY 2ND FLR CANCER HOSP [facility]) [address]  [place] [address]  [phone]        Apr 18, 2024 9:10 AM  (Arrive by 9:00 AM)  LAB ONLY with LAB [facility]  [id number]  [facility] [place] INTERNAL MEDICINE LAB [facility] [place] [facility]) [address]  [place] [address]  [phone]        Apr 19, 2024 8:00 AM  (Arrive by 7:45 AM)  PHONE VISIT - Mal[facility] Searing VISIT with Kathlean Pao, LCSW  [place] [facility]) [address]  [place] [address]  [phone]   This visit will take place by a telephone call. The provider will call you at the time of your visit.     Reminder that Medicare Annual Wellness visits will be completed by a Care Manager not your Primary Care Provider. The Care Manager will call you at the time of your visit.     Have a complete list of your medications including vitamins, supplements and over-the-counter medication.    Have a list of top medical concerns or questions and be prepared to discuss your medical history including chronic illness and any past surgeries or hospitalizations.         Apr 25, 2024 9:00 AM  (Arrive by 8:45 AM)  PHYSICAL EXAM with Levonne Genese Powell-Tillman, MD  [facility] [place] [facility]) [address]  [place] [address]  [phone]   Arrive 15 minutes prior to appointment.              ______________________________________________________________________  Discharge Day Services:  BP 158/86  - Pulse 95  - Temp 36.9 ??C (98.4 ??F) (Oral)  - Resp 18  - Ht 167.6 cm (5' 6)  - Wt 65.3 kg (144 lb)  - SpO2 97%  - BMI 23.24 kg/m??     Pt seen on the day of discharge and determined appropriate for discharge.    Condition at Discharge: good    Length of Discharge: I spent greater than 30 mins in the discharge of this patient.

## 2024-03-08 MED ORDER — AMLODIPINE 10 MG TABLET
ORAL_TABLET | Freq: Every day | ORAL | 0 refills | 5.00000 days | Status: CP
Start: 2024-03-08 — End: 2024-03-13
  Filled 2024-03-07: qty 5, 5d supply, fill #0

## 2024-03-08 MED ORDER — LIDOCAINE 4 % TOPICAL PATCH
MEDICATED_PATCH | Freq: Every day | TRANSDERMAL | 0 refills | 5.00000 days | Status: CP
Start: 2024-03-08 — End: ?

## 2024-03-08 MED ORDER — ASPIRIN 81 MG CHEWABLE TABLET
ORAL_TABLET | Freq: Every day | ORAL | 0 refills | 5.00000 days | Status: CP
Start: 2024-03-08 — End: 2024-03-13
  Filled 2024-03-07: qty 5, 5d supply, fill #0

## 2024-03-08 MED ORDER — DEXAMETHASONE 2 MG TABLET
ORAL_TABLET | Freq: Every day | ORAL | 0 refills | 5.00000 days | Status: CP
Start: 2024-03-08 — End: ?
  Filled 2024-03-07: qty 5, 5d supply, fill #0

## 2024-03-08 MED ORDER — DULOXETINE 20 MG CAPSULE,DELAYED RELEASE
ORAL_CAPSULE | Freq: Every day | ORAL | 0 refills | 5.00000 days | Status: CP
Start: 2024-03-08 — End: 2024-06-06
  Filled 2024-03-07: qty 10, 5d supply, fill #0

## 2024-03-09 NOTE — Unmapped (Signed)
 Sim and tx for single fraction to spine, 8 Gy x 1. Patient and her family will be discharged with hospice care. We will remain available for any questions or concerns that may arise.   Tanyika Barros L. Gabriel John, MD  Assistant Professor  Department of Radiation Oncology

## 2024-03-13 MED ORDER — BUPRENORPHINE 10 MCG/HOUR WEEKLY TRANSDERMAL PATCH
MEDICATED_PATCH | TRANSDERMAL | 0 refills | 28.00000 days | Status: CP
Start: 2024-03-13 — End: 2024-04-12

## 2024-03-20 ENCOUNTER — Encounter: Admit: 2024-03-20 | Discharge: 2024-03-21 | Payer: Medicare (Managed Care)

## 2024-03-20 DIAGNOSIS — R630 Anorexia: Principal | ICD-10-CM

## 2024-03-20 MED ORDER — MIRTAZAPINE 7.5 MG TABLET
ORAL_TABLET | Freq: Every evening | ORAL | 0 refills | 28.00000 days | Status: CP
Start: 2024-03-20 — End: 2024-04-17

## 2024-03-20 NOTE — Unmapped (Signed)
 OUTPATIENT ONCOLOGY PALLIATIVE CARE CONSULT NOTE    Kayla Knight is a 83 y.o. female who is seen in consultation at the request of Kayla Knight for evaluation of Symptoms     Principal Diagnosis: Kayla Knight is a 83 y.o. 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 Disease sites include lung and bones.     Assessment/Plan:     Malignant neoplasm of unknown primary site  Malignant neoplasm of unknown primary site complicates treatment. Radiation to the spine improved mobility and pain. Systemic treatment not pursued due to advanced stage and physical condition. Hospice care focuses on quality of life and symptom management.  - Continue hospice care focusing on quality of life and symptom management.    Pain management with opioids  Pain managed with Butrans  patch and reduced oxycodone  dosage, improving alertness and reducing sedation.  - Continue Butrans  patch-10 mcg/hr  - Continue oxycodone  10 mg every 4 hours as needed.  - Use acetaminophen  as adjunctive pain relief.    Opioid-induced constipation  Opioid-induced constipation due to reduced mobility and opioid use. Adjustments made for bowel movement regularity.  - Continue miralax .  - Reintroduce Senokot at one tablet twice a day, adjust based on bowel movement frequency.  - Consider Milk of Magnesia as an alternative to Senokot, not both simultaneously. Starting with small dose. 1 tab up to 2x/day as needed  - Monitor bowel movement frequency and adjust laxative use accordingly.    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 Appetite stimulation prioritized to improve energy and quality of life.  - Prescribe mirtazapine  7.5 mg every night to stimulate appetite, reassess after 10 days.  - Encourage gradual increase in physical activity as tolerated.  - Connect with hospice team about considering to increase decadron  in the upcoming future. Reviewed side effects.     Goals of Care  Prioritizes independence and quality of life over aggressive treatment. Open to spiritual support and values autonomy.  - Focus on symptom management and maintaining quality of life.  - Consider increasing dexamethasone  if more energy is needed, monitor for side effects.    Follow-up  Under hospice care with regular visits. Primary care doctor may assist in future care coordination.  - Coordinate with hospice team for ongoing care and symptom management.  - Ensure hospice team is available for support and urgent needs.  - No more visits with our team due to hospice engagement.        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 This May dx with a cancer of unknown origin who presents for support for appetite. She is accompanied by her daughter, Kayla [facility], who is her primary caregiver. Hospitalized from 5/27-6/10 with uncontrolled pain. PET 02/10/2024 with new diagnosed RUL mass with associated mediastinal and subcarinal lymphadenopathy as well as lytic lesion and R pleural effusion concerning for metastatic disease, so presented for uncontrolled pain, nausea and vomiting in need of expedited cancer workup. Now on hospice services.     She experiences persistent pain requiring opioid medication, leading to gastrointestinal  issues such as anorexia and constipation. Radiation therapy to her spine has improved mobility and reduced nausea. Pain management includes a Butrans  patch and reduced oxycodone  10 mg every 4-6 hours intake, which has improved her alertness. Acetaminophen  is also used for pain relief.    Significant anorexia limits her food intake to a few bites at a time, and she seeks to improve her appetite. Constipation is managed with Miralax  twice daily and has used senokot and MOM in the past-not presently . Dexamethasone  2 mg every morning aids her energy levels.    She values autonomy and desires to maintain independence, expressing a wish not to burden her family. With a [place] background, she is interested in writing a diary to support others facing similar challenges.          Palliative Performance Scale: 60% - Ambulation: Reduced / unable to do hobby or some housework, significant disease / Self-Care:Occasional assist as necessary / Intake:Normal or reduced / Level of Conscious: Full or confusion        Objective     Opioid Risk Tool:    Female  Female    Family history of substance abuse      Alcohol  1  3    Illegal drugs  2  3    Rx drugs  4  4    Personal history of substance abuse      Alcohol  3  3    Illegal drugs  4  4    Rx drugs  5  5    Age between 16--45 years  1  1    History of preadolescent sexual abuse  3  0    Psychological disease      ADD, OCD, bipolar, schizophrenia  2  2    Depression  1  1       Total: low risk per chart review  (<3 low risk, 4-7 moderate risk, >8 high risk)    Hematology/Oncology History   Metastatic malignant neoplasm, unspecified site      03/07/2024 Initial Diagnosis    Metastatic malignant neoplasm, unspecified site        03/07/2024 -  Radiation    Radiation Therapy Treatment Details (Noted on 03/07/2024)  Sites: Thoracic spine, Lumbar spine, Sacrum  Technique: 3D CRT  Goal: Palliative  Planned Treatment Start Date: No planned start date specified         Past Medical History[1]    Allergies: Allergies[2]    Family History:  family history includes Alcohol abuse in her father; Arthritis in her father, mother, sister, and sister; Asthma in her daughter; COPD in her sister; Cancer in her maternal aunt; Dementia in her mother; Depression in her sister; Diabetes in her daughter, paternal aunt, paternal aunt, and sister; Early death in her maternal aunt; Glaucoma in her father, sister, and sister; Heart attack (age of onset: 81) in her father; Heart disease in her father and mother; Hypertension in her daughter, father, mother, sister, and sister; Kidney disease in her paternal aunt; Miscarriages / Stillbirths in her daughter; No Known Problems in her brother, maternal aunt, maternal grandfather, maternal grandmother, maternal uncle, paternal grandfather, paternal grandmother, and paternal uncle; Thyroid disease in her father.  She indicated that her mother is deceased. She indicated that her father is deceased. She indicated that only one of her two sisters is alive. She indicated that the status of her brother is unknown. She indicated that the status of her maternal grandmother is  unknown. She indicated that the status of her maternal grandfather is unknown. She indicated that the status of her paternal grandmother is unknown. She indicated that the status of her paternal grandfather is unknown. She indicated that the status of her maternal uncle is unknown. She indicated that the status of her paternal uncle is unknown. She indicated that the status of her neg hx is unknown.      REVIEW OF SYSTEMS:  A comprehensive review of 10 systems was negative except for pertinent positives noted in HPI.    PHYSICAL EXAM:   Video visit  GEN: Awake and alert, pleasant appearing female in no acute distress        Kayla Sor, FNP-BC, [facility]  [facility]   [facility]  [address] [place], [place] [address]      The patient reports they are physically located in   and is currently: at home. I conducted a audio/video visit. I spent  62m 52s on the video call with the patient. I spent an additional 15 minutes on pre- and post-visit activities on the date of service .  Non billable-on hospice care.          [1]   Past Medical History:  Diagnosis Date    Anemia     past history, over 40 yrs ago    Cataract 2008 Surgery    Diabetes mellitus        Type II, on 9/15 patient stated she was not longer diabetic, not taking meds    Dry eye     Hearing impairment     Heart murmur 2016    [facility] Tyna [facility]    Heart valve disease     Heart mumur, pt reports diagnosis 10 yrs ago (05/10/20)    History of stent insertion of renal artery 01/29/2020    ASA indefinitely and plavix  x 6 months per procedure note    Hypertension     pt reports taking meds since about 1995    Metastatic malignant neoplasm    02/22/2024    Neuromuscular disorder    2014    Feet & Legs    Tear of meniscus of knee     4.5 yrs ago    Urinary incontinence     seen a doctor about nighttime bathroom use (adressed in 2019)   [2]   Allergies  Allergen Reactions    Penicillins Itching and Swelling    Grass Pollen-June Grass Standard Other (See Comments)     Asthma attack     Lisinopril  Cough    Statins-Hmg-Coa Reductase Inhibitors      Ineffective at controlling cholesterol when tried in the past.    Sulfa (Sulfonamide Antibiotics) Itching

## 2024-03-21 NOTE — Unmapped (Signed)
 LCSW called patient and left a message regarding cancellation of 04/19/2024 phone visit for AWV. Patient was informed on message that visit is not required and her PCP requested it be cancelled.    Patient is not eligible for AWV while on hospice care.

## 2024-03-28 DIAGNOSIS — R630 Anorexia: Principal | ICD-10-CM

## 2024-03-28 MED ORDER — MIRTAZAPINE 7.5 MG TABLET
ORAL_TABLET | Freq: Every evening | ORAL | 1 refills | 90.00000 days
Start: 2024-03-28 — End: 2024-04-25

## 2024-03-28 NOTE — Unmapped (Signed)
 Pt active with hospice. Would prefer the hospice team to manage the refills for the remeron .     Dorthea Sor, FNP-BC, [facility]  Outpatient Oncology Palliative Care Service  [facility]  [address], [place], [place] [address]  [phone]

## 2024-03-28 NOTE — Unmapped (Signed)
**Note De-identified  [place] Obfuscation** Please advise 

## 2024-03-28 NOTE — Unmapped (Signed)
 Please refill if appropriate.     Most recent clinic visit: 02/01/2024  Next clinic visit: Visit date not found

## 2024-04-13 NOTE — Unmapped (Signed)
 FYI. From patient's daughter.

## 2024-04-28 DEATH — deceased
